# Patient Record
Sex: Male | Born: 1965 | Race: White | Hispanic: No | Smoking: Never smoker
Health system: Southern US, Community
[De-identification: ages and names within clinical notes are randomized; demographics above are authoritative.]

## PROBLEM LIST (undated history)

## (undated) DIAGNOSIS — I5022 Chronic systolic (congestive) heart failure: Secondary | ICD-10-CM

## (undated) DIAGNOSIS — I491 Atrial premature depolarization: Secondary | ICD-10-CM

## (undated) DIAGNOSIS — K5090 Crohn's disease, unspecified, without complications: Secondary | ICD-10-CM

## (undated) DIAGNOSIS — I2510 Atherosclerotic heart disease of native coronary artery without angina pectoris: Secondary | ICD-10-CM

## (undated) DIAGNOSIS — I471 Supraventricular tachycardia, unspecified: Secondary | ICD-10-CM

## (undated) DIAGNOSIS — F329 Major depressive disorder, single episode, unspecified: Secondary | ICD-10-CM

## (undated) DIAGNOSIS — I5020 Unspecified systolic (congestive) heart failure: Secondary | ICD-10-CM

## (undated) DIAGNOSIS — I428 Other cardiomyopathies: Secondary | ICD-10-CM

## (undated) DIAGNOSIS — N200 Calculus of kidney: Secondary | ICD-10-CM

## (undated) DIAGNOSIS — I10 Essential (primary) hypertension: Secondary | ICD-10-CM

## (undated) DIAGNOSIS — R011 Cardiac murmur, unspecified: Secondary | ICD-10-CM

## (undated) DIAGNOSIS — E669 Obesity, unspecified: Secondary | ICD-10-CM

## (undated) DIAGNOSIS — M549 Dorsalgia, unspecified: Secondary | ICD-10-CM

## (undated) DIAGNOSIS — I4891 Unspecified atrial fibrillation: Secondary | ICD-10-CM

## (undated) DIAGNOSIS — G4733 Obstructive sleep apnea (adult) (pediatric): Secondary | ICD-10-CM

## (undated) DIAGNOSIS — F988 Other specified behavioral and emotional disorders with onset usually occurring in childhood and adolescence: Secondary | ICD-10-CM

## (undated) DIAGNOSIS — G8929 Other chronic pain: Secondary | ICD-10-CM

## (undated) HISTORY — DX: Calculus of kidney: N20.0

## (undated) HISTORY — DX: Essential (primary) hypertension: I10

## (undated) HISTORY — DX: Supraventricular tachycardia, unspecified: I47.10

## (undated) HISTORY — DX: Obstructive sleep apnea (adult) (pediatric): G47.33

## (undated) HISTORY — DX: Other cardiomyopathies: I42.8

## (undated) HISTORY — DX: Unspecified systolic (congestive) heart failure: I50.20

## (undated) HISTORY — DX: Other chronic pain: G89.29

## (undated) HISTORY — PX: CARDIAC CATHETERIZATION: SHX172

## (undated) HISTORY — DX: Supraventricular tachycardia: I47.1

## (undated) HISTORY — DX: Major depressive disorder, single episode, unspecified: F32.9

## (undated) HISTORY — DX: Chronic systolic (congestive) heart failure: I50.22

## (undated) HISTORY — DX: Other chronic pain: M54.9

## (undated) HISTORY — PX: VASECTOMY: SHX75

## (undated) HISTORY — DX: Atherosclerotic heart disease of native coronary artery without angina pectoris: I25.10

## (undated) HISTORY — PX: KIDNEY STONE SURGERY: SHX686

## (undated) HISTORY — DX: Atrial premature depolarization: I49.1

---

## 2008-12-24 ENCOUNTER — Emergency Department: Payer: Self-pay | Admitting: Emergency Medicine

## 2011-10-01 ENCOUNTER — Emergency Department: Payer: Self-pay | Admitting: Emergency Medicine

## 2011-10-01 LAB — BASIC METABOLIC PANEL
BUN: 13 mg/dL (ref 7–18)
Calcium, Total: 9.3 mg/dL (ref 8.5–10.1)
Chloride: 102 mmol/L (ref 98–107)
Co2: 29 mmol/L (ref 21–32)
Creatinine: 1.05 mg/dL (ref 0.60–1.30)
EGFR (African American): >60
Glucose: 89 mg/dL (ref 65–99)

## 2011-10-01 LAB — CBC
MCH: 29.1 pg (ref 26.0–34.0)
MCV: 90 fL (ref 80–100)
Platelet: 348 10*3/uL (ref 150–440)

## 2011-10-01 LAB — CK TOTAL AND CKMB (NOT AT ARMC): CK-MB: 2.8 ng/mL (ref 0.5–3.6)

## 2011-10-01 LAB — TROPONIN I
Troponin-I: <0.02 ng/mL
Troponin-I: <0.02 ng/mL

## 2011-10-03 ENCOUNTER — Ambulatory Visit: Payer: Self-pay | Admitting: Family Medicine

## 2011-11-08 ENCOUNTER — Ambulatory Visit: Payer: Self-pay | Admitting: Cardiology

## 2011-11-29 ENCOUNTER — Ambulatory Visit: Payer: Self-pay | Admitting: Cardiology

## 2013-03-03 ENCOUNTER — Emergency Department: Payer: Self-pay | Admitting: Internal Medicine

## 2013-03-03 LAB — COMPREHENSIVE METABOLIC PANEL
Alkaline Phosphatase: 128 U/L (ref 50–136)
BUN: 13 mg/dL (ref 7–18)
Calcium, Total: 9.3 mg/dL (ref 8.5–10.1)
Co2: 28 mmol/L (ref 21–32)
Creatinine: 1.41 mg/dL — ABNORMAL HIGH (ref 0.60–1.30)
EGFR (Non-African Amer.): 59 — ABNORMAL LOW
Osmolality: 275 (ref 275–301)
Potassium: 3.3 mmol/L — ABNORMAL LOW (ref 3.5–5.1)
SGOT(AST): 28 U/L (ref 15–37)
SGPT (ALT): 34 U/L (ref 12–78)
Total Protein: 7.8 g/dL (ref 6.4–8.2)

## 2013-03-03 LAB — URINALYSIS, COMPLETE
Glucose,UR: NEGATIVE mg/dL (ref 0–75)
Ketone: NEGATIVE
Nitrite: NEGATIVE
Ph: 5 (ref 4.5–8.0)
Protein: NEGATIVE
RBC,UR: 6 /HPF (ref 0–5)
Specific Gravity: 1.017 (ref 1.003–1.030)
WBC UR: 1 /HPF (ref 0–5)

## 2013-03-03 LAB — CBC: HGB: 15.8 g/dL (ref 13.0–18.0)

## 2015-09-20 DIAGNOSIS — Z6841 Body Mass Index (BMI) 40.0 and over, adult: Secondary | ICD-10-CM

## 2016-06-17 DIAGNOSIS — I10 Essential (primary) hypertension: Secondary | ICD-10-CM | POA: Diagnosis not present

## 2016-06-17 DIAGNOSIS — J449 Chronic obstructive pulmonary disease, unspecified: Secondary | ICD-10-CM | POA: Diagnosis not present

## 2016-06-17 DIAGNOSIS — D513 Other dietary vitamin B12 deficiency anemia: Secondary | ICD-10-CM | POA: Diagnosis not present

## 2016-06-17 DIAGNOSIS — E109 Type 1 diabetes mellitus without complications: Secondary | ICD-10-CM | POA: Diagnosis not present

## 2016-06-17 DIAGNOSIS — Q6101 Congenital single renal cyst: Secondary | ICD-10-CM | POA: Diagnosis not present

## 2016-06-17 DIAGNOSIS — E559 Vitamin D deficiency, unspecified: Secondary | ICD-10-CM | POA: Diagnosis not present

## 2016-06-17 DIAGNOSIS — R1013 Epigastric pain: Secondary | ICD-10-CM | POA: Diagnosis not present

## 2016-06-25 DIAGNOSIS — E042 Nontoxic multinodular goiter: Secondary | ICD-10-CM | POA: Diagnosis not present

## 2016-06-25 DIAGNOSIS — R05 Cough: Secondary | ICD-10-CM | POA: Diagnosis not present

## 2016-06-25 DIAGNOSIS — J449 Chronic obstructive pulmonary disease, unspecified: Secondary | ICD-10-CM | POA: Diagnosis not present

## 2016-06-25 DIAGNOSIS — J9801 Acute bronchospasm: Secondary | ICD-10-CM | POA: Diagnosis not present

## 2016-06-25 DIAGNOSIS — Z23 Encounter for immunization: Secondary | ICD-10-CM | POA: Diagnosis not present

## 2016-06-25 DIAGNOSIS — I10 Essential (primary) hypertension: Secondary | ICD-10-CM | POA: Diagnosis not present

## 2016-07-02 DIAGNOSIS — R05 Cough: Secondary | ICD-10-CM | POA: Diagnosis not present

## 2016-07-02 DIAGNOSIS — J310 Chronic rhinitis: Secondary | ICD-10-CM | POA: Diagnosis not present

## 2016-07-23 DIAGNOSIS — R05 Cough: Secondary | ICD-10-CM | POA: Diagnosis not present

## 2016-07-23 DIAGNOSIS — G4733 Obstructive sleep apnea (adult) (pediatric): Secondary | ICD-10-CM | POA: Diagnosis not present

## 2016-09-03 DIAGNOSIS — I10 Essential (primary) hypertension: Secondary | ICD-10-CM | POA: Diagnosis not present

## 2016-09-03 DIAGNOSIS — E041 Nontoxic single thyroid nodule: Secondary | ICD-10-CM | POA: Diagnosis not present

## 2016-09-18 DIAGNOSIS — M9905 Segmental and somatic dysfunction of pelvic region: Secondary | ICD-10-CM | POA: Diagnosis not present

## 2016-09-18 DIAGNOSIS — M9903 Segmental and somatic dysfunction of lumbar region: Secondary | ICD-10-CM | POA: Diagnosis not present

## 2016-09-18 DIAGNOSIS — M5442 Lumbago with sciatica, left side: Secondary | ICD-10-CM | POA: Diagnosis not present

## 2016-09-25 DIAGNOSIS — M9903 Segmental and somatic dysfunction of lumbar region: Secondary | ICD-10-CM | POA: Diagnosis not present

## 2016-09-25 DIAGNOSIS — M5442 Lumbago with sciatica, left side: Secondary | ICD-10-CM | POA: Diagnosis not present

## 2016-09-25 DIAGNOSIS — M9905 Segmental and somatic dysfunction of pelvic region: Secondary | ICD-10-CM | POA: Diagnosis not present

## 2016-09-26 DIAGNOSIS — J342 Deviated nasal septum: Secondary | ICD-10-CM | POA: Diagnosis not present

## 2016-09-26 DIAGNOSIS — E041 Nontoxic single thyroid nodule: Secondary | ICD-10-CM | POA: Diagnosis not present

## 2016-09-30 ENCOUNTER — Other Ambulatory Visit: Payer: Self-pay | Admitting: *Deleted

## 2016-09-30 ENCOUNTER — Other Ambulatory Visit: Payer: Self-pay | Admitting: Otolaryngology

## 2016-09-30 DIAGNOSIS — E041 Nontoxic single thyroid nodule: Secondary | ICD-10-CM

## 2016-10-10 ENCOUNTER — Ambulatory Visit
Admission: RE | Admit: 2016-10-10 | Discharge: 2016-10-10 | Disposition: A | Discharge status: Home / Self Care | Payer: 59 | Source: Ambulatory Visit | Attending: Otolaryngology | Admitting: Otolaryngology

## 2016-10-10 DIAGNOSIS — E041 Nontoxic single thyroid nodule: Secondary | ICD-10-CM

## 2016-10-10 DIAGNOSIS — E042 Nontoxic multinodular goiter: Secondary | ICD-10-CM | POA: Diagnosis not present

## 2017-01-27 DIAGNOSIS — G4733 Obstructive sleep apnea (adult) (pediatric): Secondary | ICD-10-CM | POA: Diagnosis not present

## 2017-01-27 DIAGNOSIS — I10 Essential (primary) hypertension: Secondary | ICD-10-CM | POA: Diagnosis not present

## 2017-07-09 DIAGNOSIS — H1033 Unspecified acute conjunctivitis, bilateral: Secondary | ICD-10-CM | POA: Diagnosis not present

## 2017-07-09 DIAGNOSIS — J40 Bronchitis, not specified as acute or chronic: Secondary | ICD-10-CM | POA: Diagnosis not present

## 2017-07-09 DIAGNOSIS — J019 Acute sinusitis, unspecified: Secondary | ICD-10-CM | POA: Diagnosis not present

## 2017-07-09 DIAGNOSIS — R509 Fever, unspecified: Secondary | ICD-10-CM | POA: Diagnosis not present

## 2017-07-09 DIAGNOSIS — R05 Cough: Secondary | ICD-10-CM | POA: Diagnosis not present

## 2017-08-11 DIAGNOSIS — R109 Unspecified abdominal pain: Secondary | ICD-10-CM | POA: Diagnosis not present

## 2017-08-11 DIAGNOSIS — R300 Dysuria: Secondary | ICD-10-CM | POA: Diagnosis not present

## 2017-08-11 DIAGNOSIS — M545 Low back pain: Secondary | ICD-10-CM | POA: Diagnosis not present

## 2017-08-11 DIAGNOSIS — E876 Hypokalemia: Secondary | ICD-10-CM | POA: Diagnosis not present

## 2017-08-11 DIAGNOSIS — J929 Pleural plaque without asbestos: Secondary | ICD-10-CM | POA: Diagnosis not present

## 2017-08-13 DIAGNOSIS — M9905 Segmental and somatic dysfunction of pelvic region: Secondary | ICD-10-CM | POA: Diagnosis not present

## 2017-08-13 DIAGNOSIS — M545 Low back pain: Secondary | ICD-10-CM | POA: Diagnosis not present

## 2017-08-13 DIAGNOSIS — M9903 Segmental and somatic dysfunction of lumbar region: Secondary | ICD-10-CM | POA: Diagnosis not present

## 2017-08-15 DIAGNOSIS — M9905 Segmental and somatic dysfunction of pelvic region: Secondary | ICD-10-CM | POA: Diagnosis not present

## 2017-08-15 DIAGNOSIS — M545 Low back pain: Secondary | ICD-10-CM | POA: Diagnosis not present

## 2017-08-15 DIAGNOSIS — M9903 Segmental and somatic dysfunction of lumbar region: Secondary | ICD-10-CM | POA: Diagnosis not present

## 2017-08-20 DIAGNOSIS — M9905 Segmental and somatic dysfunction of pelvic region: Secondary | ICD-10-CM | POA: Diagnosis not present

## 2017-08-20 DIAGNOSIS — M9903 Segmental and somatic dysfunction of lumbar region: Secondary | ICD-10-CM | POA: Diagnosis not present

## 2017-08-20 DIAGNOSIS — M545 Low back pain: Secondary | ICD-10-CM | POA: Diagnosis not present

## 2017-08-28 DIAGNOSIS — M9903 Segmental and somatic dysfunction of lumbar region: Secondary | ICD-10-CM | POA: Diagnosis not present

## 2017-08-28 DIAGNOSIS — M9905 Segmental and somatic dysfunction of pelvic region: Secondary | ICD-10-CM | POA: Diagnosis not present

## 2017-08-28 DIAGNOSIS — M545 Low back pain: Secondary | ICD-10-CM | POA: Diagnosis not present

## 2017-08-29 DIAGNOSIS — M545 Low back pain: Secondary | ICD-10-CM | POA: Diagnosis not present

## 2017-08-29 DIAGNOSIS — R202 Paresthesia of skin: Secondary | ICD-10-CM | POA: Diagnosis not present

## 2017-08-29 DIAGNOSIS — M5416 Radiculopathy, lumbar region: Secondary | ICD-10-CM | POA: Diagnosis not present

## 2017-08-29 DIAGNOSIS — R1031 Right lower quadrant pain: Secondary | ICD-10-CM | POA: Diagnosis not present

## 2017-08-29 DIAGNOSIS — R350 Frequency of micturition: Secondary | ICD-10-CM | POA: Diagnosis not present

## 2017-08-29 DIAGNOSIS — R1011 Right upper quadrant pain: Secondary | ICD-10-CM | POA: Diagnosis not present

## 2017-09-02 ENCOUNTER — Other Ambulatory Visit: Payer: Self-pay | Admitting: Family Medicine

## 2017-09-02 DIAGNOSIS — M4726 Other spondylosis with radiculopathy, lumbar region: Secondary | ICD-10-CM | POA: Diagnosis not present

## 2017-09-02 DIAGNOSIS — M545 Low back pain: Secondary | ICD-10-CM | POA: Diagnosis not present

## 2017-09-02 DIAGNOSIS — M5416 Radiculopathy, lumbar region: Secondary | ICD-10-CM | POA: Diagnosis not present

## 2017-09-02 DIAGNOSIS — I10 Essential (primary) hypertension: Secondary | ICD-10-CM | POA: Diagnosis not present

## 2017-09-02 DIAGNOSIS — E876 Hypokalemia: Secondary | ICD-10-CM | POA: Diagnosis not present

## 2017-09-03 ENCOUNTER — Other Ambulatory Visit: Payer: Self-pay | Admitting: Family Medicine

## 2017-09-03 DIAGNOSIS — M545 Low back pain, unspecified: Secondary | ICD-10-CM

## 2017-09-04 ENCOUNTER — Ambulatory Visit
Admission: RE | Admit: 2017-09-04 | Discharge: 2017-09-04 | Disposition: A | Discharge status: Home / Self Care | Payer: 59 | Source: Ambulatory Visit | Attending: Family Medicine | Admitting: Family Medicine

## 2017-09-04 DIAGNOSIS — R93421 Abnormal radiologic findings on diagnostic imaging of right kidney: Secondary | ICD-10-CM | POA: Diagnosis not present

## 2017-09-04 DIAGNOSIS — N281 Cyst of kidney, acquired: Secondary | ICD-10-CM | POA: Insufficient documentation

## 2017-09-04 DIAGNOSIS — M545 Low back pain, unspecified: Secondary | ICD-10-CM

## 2017-09-09 ENCOUNTER — Other Ambulatory Visit: Payer: Self-pay | Admitting: Family Medicine

## 2017-09-09 DIAGNOSIS — N281 Cyst of kidney, acquired: Secondary | ICD-10-CM

## 2017-09-11 ENCOUNTER — Other Ambulatory Visit: Payer: Self-pay | Admitting: Family Medicine

## 2017-09-11 ENCOUNTER — Ambulatory Visit
Admission: RE | Admit: 2017-09-11 | Discharge: 2017-09-11 | Disposition: A | Discharge status: Home / Self Care | Payer: 59 | Source: Ambulatory Visit | Attending: Family Medicine | Admitting: Family Medicine

## 2017-09-11 DIAGNOSIS — N281 Cyst of kidney, acquired: Secondary | ICD-10-CM

## 2017-09-11 DIAGNOSIS — M5126 Other intervertebral disc displacement, lumbar region: Secondary | ICD-10-CM | POA: Diagnosis not present

## 2017-09-11 DIAGNOSIS — M4807 Spinal stenosis, lumbosacral region: Secondary | ICD-10-CM | POA: Insufficient documentation

## 2017-09-11 DIAGNOSIS — M5116 Intervertebral disc disorders with radiculopathy, lumbar region: Secondary | ICD-10-CM | POA: Insufficient documentation

## 2017-09-11 DIAGNOSIS — Z0000 Encounter for general adult medical examination without abnormal findings: Secondary | ICD-10-CM | POA: Diagnosis not present

## 2017-09-11 DIAGNOSIS — E876 Hypokalemia: Secondary | ICD-10-CM | POA: Diagnosis not present

## 2017-09-11 DIAGNOSIS — M5416 Radiculopathy, lumbar region: Secondary | ICD-10-CM | POA: Diagnosis present

## 2017-09-11 DIAGNOSIS — I10 Essential (primary) hypertension: Secondary | ICD-10-CM | POA: Diagnosis not present

## 2017-09-11 DIAGNOSIS — M5127 Other intervertebral disc displacement, lumbosacral region: Secondary | ICD-10-CM | POA: Insufficient documentation

## 2017-09-11 DIAGNOSIS — M48061 Spinal stenosis, lumbar region without neurogenic claudication: Secondary | ICD-10-CM | POA: Diagnosis not present

## 2017-09-11 DIAGNOSIS — R739 Hyperglycemia, unspecified: Secondary | ICD-10-CM | POA: Diagnosis not present

## 2017-09-24 ENCOUNTER — Ambulatory Visit
Admission: RE | Admit: 2017-09-24 | Discharge: 2017-09-24 | Disposition: A | Discharge status: Home / Self Care | Payer: 59 | Source: Ambulatory Visit | Attending: Family Medicine | Admitting: Family Medicine

## 2017-09-24 DIAGNOSIS — N281 Cyst of kidney, acquired: Secondary | ICD-10-CM

## 2017-09-24 DIAGNOSIS — K760 Fatty (change of) liver, not elsewhere classified: Secondary | ICD-10-CM | POA: Diagnosis not present

## 2017-10-01 DIAGNOSIS — M62830 Muscle spasm of back: Secondary | ICD-10-CM | POA: Diagnosis not present

## 2017-10-01 DIAGNOSIS — M5416 Radiculopathy, lumbar region: Secondary | ICD-10-CM | POA: Diagnosis not present

## 2017-10-01 DIAGNOSIS — M4726 Other spondylosis with radiculopathy, lumbar region: Secondary | ICD-10-CM | POA: Diagnosis not present

## 2017-10-10 ENCOUNTER — Ambulatory Visit: Payer: Self-pay

## 2017-10-10 DIAGNOSIS — M5136 Other intervertebral disc degeneration, lumbar region: Secondary | ICD-10-CM | POA: Diagnosis not present

## 2017-10-10 DIAGNOSIS — M5416 Radiculopathy, lumbar region: Secondary | ICD-10-CM | POA: Diagnosis not present

## 2017-10-20 DIAGNOSIS — M5136 Other intervertebral disc degeneration, lumbar region: Secondary | ICD-10-CM | POA: Diagnosis not present

## 2017-10-20 DIAGNOSIS — M5416 Radiculopathy, lumbar region: Secondary | ICD-10-CM | POA: Diagnosis not present

## 2017-10-21 DIAGNOSIS — M545 Low back pain: Secondary | ICD-10-CM | POA: Diagnosis not present

## 2017-10-21 DIAGNOSIS — M9903 Segmental and somatic dysfunction of lumbar region: Secondary | ICD-10-CM | POA: Diagnosis not present

## 2017-10-21 DIAGNOSIS — M9905 Segmental and somatic dysfunction of pelvic region: Secondary | ICD-10-CM | POA: Diagnosis not present

## 2017-10-22 ENCOUNTER — Encounter: Payer: Self-pay | Admitting: Physician Assistant

## 2017-10-22 ENCOUNTER — Ambulatory Visit: Payer: 59 | Admitting: Physician Assistant

## 2017-10-22 VITALS — BP 114/72 | HR 76 | Temp 98.5°F | Resp 16 | Ht 69.0 in | Wt 300.0 lb

## 2017-10-22 DIAGNOSIS — R5383 Other fatigue: Secondary | ICD-10-CM | POA: Diagnosis not present

## 2017-10-22 DIAGNOSIS — G4730 Sleep apnea, unspecified: Secondary | ICD-10-CM

## 2017-10-22 DIAGNOSIS — R7303 Prediabetes: Secondary | ICD-10-CM | POA: Diagnosis not present

## 2017-10-22 DIAGNOSIS — M549 Dorsalgia, unspecified: Secondary | ICD-10-CM

## 2017-10-22 DIAGNOSIS — G8929 Other chronic pain: Secondary | ICD-10-CM | POA: Diagnosis not present

## 2017-10-22 MED ORDER — MELOXICAM 15 MG PO TABS: 15.0000 mg | ORAL_TABLET | Freq: Every day | ORAL | 0 refills | Status: DC

## 2017-10-22 NOTE — Patient Instructions (Signed)
Prediabetes Eating Plan Prediabetes--also called impaired glucose tolerance or impaired fasting glucose--is a condition that causes blood sugar (blood glucose) levels to be higher than normal. Following a healthy diet can help to keep prediabetes under control. It can also help to lower the risk of type 2 diabetes and heart disease, which are increased in people who have prediabetes. Along with regular exercise, a healthy diet:  Promotes weight loss.  Helps to control blood sugar levels.  Helps to improve the way that the body uses insulin.  What do I need to know about this eating plan?  Use the glycemic index (GI) to plan your meals. The index tells you how quickly a food will raise your blood sugar. Choose low-GI foods. These foods take a longer time to raise blood sugar.  Pay close attention to the amount of carbohydrates in the food that you eat. Carbohydrates increase blood sugar levels.  Keep track of how many calories you take in. Eating the right amount of calories will help you to achieve a healthy weight. Losing about 7 percent of your starting weight can help to prevent type 2 diabetes.  You may want to follow a Mediterranean diet. This diet includes a lot of vegetables, lean meats or fish, whole grains, fruits, and healthy oils and fats. What foods can I eat? Grains Whole grains, such as whole-wheat or whole-grain breads, crackers, cereals, and pasta. Unsweetened oatmeal. Bulgur. Barley. Quinoa. Brown rice. Corn or whole-wheat flour tortillas or taco shells. Vegetables Lettuce. Spinach. Peas. Beets. Cauliflower. Cabbage. Broccoli. Carrots. Tomatoes. Squash. Eggplant. Herbs. Peppers. Onions. Cucumbers. Brussels sprouts. Fruits Berries. Bananas. Apples. Oranges. Grapes. Papaya. Mango. Pomegranate. Kiwi. Grapefruit. Cherries. Meats and Other Protein Sources Seafood. Lean meats, such as chicken and [place] or lean cuts of pork and beef. Tofu. Eggs. Nuts. Beans. Dairy Low-fat or  fat-free dairy products, such as yogurt, cottage cheese, and cheese. Beverages Water. Tea. Coffee. Sugar-free or diet soda. Seltzer water. Milk. Milk alternatives, such as soy or almond milk. Condiments Mustard. Relish. Low-fat, low-sugar ketchup. Low-fat, low-sugar barbecue sauce. Low-fat or fat-free mayonnaise. Sweets and Desserts Sugar-free or low-fat pudding. Sugar-free or low-fat ice cream and other frozen treats. Fats and Oils Avocado. Walnuts. Olive oil. The items listed above may not be a complete list of recommended foods or beverages. Contact your dietitian for more options. What foods are not recommended? Grains Refined white flour and flour products, such as bread, pasta, snack foods, and cereals. Beverages Sweetened drinks, such as sweet iced tea and soda. Sweets and Desserts Baked goods, such as cake, cupcakes, pastries, cookies, and cheesecake. The items listed above may not be a complete list of foods and beverages to avoid. Contact your dietitian for more information. This information is not intended to replace advice given to you by your health care provider. Make sure you discuss any questions you have with your health care provider. Document Released: 10/04/2014 Document Revised: 10/26/2015 Document Reviewed: 06/15/2014 Elsevier Interactive Patient Education  2017 [employer].

## 2017-10-22 NOTE — Progress Notes (Signed)
Patient: Aaron [name] Male    DOB: 01-12-1966   52 y.o.   MRN: [id number] Visit Date: 10/22/2017  Today's Provider: Trinna Post, PA-C   Chief Complaint  Patient presents with  . Establish Care   Subjective:    Aaron [name] is a 52 y/o man with history of prediabetes, sleep apnea, and HTN presenting today to establish care. He is living in [place] with his wife of 27 years and two children ages 64 and 39. 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    Back Pain  He presents today with concern of back pain. This has been going on for two months now. He reports it is in his right side and increases with bending and twisting. Denies injuries, falls. Denies numbness or weakness. Denies difficulty urinating. He has been seen by multiple providers for this issue including urgent care at [facility], Dr. Kary Kos, and Dr. Sharlet Salina.  He saw Dr. Kary Kos about this on 4.2.2019 and an ultrasound was performed which revealed a renal cyst. This cyst was present on a 10.1.2014 ultrasound. The patient relates his pain greatly to this and says he didn't start having back pain until they discovered his cysts, however cysts were present since 2014. His PCP provided a work note for this. This work note was extended to 09/25/2017.  Then, Patient was seen by Allene Dillon, NP at physiatry office and was diagnosed with lumbar radiculopathy and an MRI lumbar spine was ordered with results in the Image tab. Patient again was written out of work, provided hydrocodone. Patient underwent at least two injections with only mild improvement in pain. Patient's work excuse was extended even further to at least 10/20/2017 by physiatry. Patient called back to physiatry on 10/15/2017 asking to extend work note. Clinic declined and recommended follow up with pain management. Patient said he will seek care elsewhere.   Patient presented on 10/20/2017 to [facility]. He again  presented with back pain. Clnic recommended muscle relaxers, patient declined, as well as lidocaine patches. Also recommended PT referral. Patient requested a work note and clinic declined. Provider at that clinic even consulted with PCP Dr. Kary Kos.   Currently, patient has an appointment scheduled with urology on 11/14/2017.  HTN  Controlled on Edarby Clor.   ADD(?)/Fatigue  Patient says he is prescribed Adderall for fatigue due to 12 hour shifts. Says he does not have ADD despite this being listed in his Problem List.   Sleep Apnea  Reports compliance with auto-titrating CPAP.  Living with wife - married 27 years Children: two children 33 and 76 Work: Production designer, theatre/television/film, Merchant navy officer run a Designer, television/film set Cancer Screening  Due. Says he will "get it in the next year."  Back Pain  This is a chronic problem. The current episode started more than 1 month ago (Started about two months ago.). The problem is unchanged. The pain is present in the lumbar spine (Left side). The quality of the pain is described as aching. The pain does not radiate. The pain is at a severity of 6/10. The symptoms are aggravated by bending. Pertinent negatives include no abdominal pain, bladder incontinence, bowel incontinence, chest pain, dysuria, fever, headaches, leg pain, numbness, paresis, paresthesias, pelvic pain, perianal numbness, tingling, weakness or weight loss. He has tried chiropractic manipulation, analgesics and home exercises for the symptoms.       No Known Allergies   Current Outpatient  Medications:  .  amphetamine-dextroamphetamine (ADDERALL) 30 MG tablet, Take by mouth., Disp: , Rfl:  .  EDARBYCLOR 40-25 MG TABS, , Disp: , Rfl:  .  HYDROcodone-acetaminophen (NORCO/VICODIN) 5-325 MG tablet, Take by mouth., Disp: , Rfl:  .  traZODone (DESYREL) 50 MG tablet, TAKE 1 TABLET NIGHTLY AS   NEEDED FOR SLEEP, Disp: , Rfl:  .  metaxalone (SKELAXIN) 800 MG tablet, TAKE 1 TABLET (800 MG TOTAL)  BY MOUTH 3 (THREE) TIMES DAILY AS NEEDED FOR PAIN, Disp: , Rfl: 0  Review of Systems  Constitutional: Negative.  Negative for fever and weight loss.  HENT: Positive for tinnitus. Negative for congestion, dental problem, drooling, ear discharge, ear pain, facial swelling, hearing loss, mouth sores, nosebleeds, postnasal drip, rhinorrhea, sinus pressure, sinus pain, sneezing, sore throat, trouble swallowing and voice change.   Eyes: Negative.   Respiratory: Negative.   Cardiovascular: Negative.  Negative for chest pain.  Gastrointestinal: Negative.  Negative for abdominal pain and bowel incontinence.  Endocrine: Negative.   Genitourinary: Positive for frequency. Negative for bladder incontinence, decreased urine volume, difficulty urinating, discharge, dysuria, enuresis, flank pain, genital sores, hematuria, pelvic pain, penile pain, penile swelling, scrotal swelling, testicular pain and urgency.  Musculoskeletal: Positive for back pain.  Skin: Negative.   Allergic/Immunologic: Negative.   Neurological: Negative.  Negative for tingling, weakness, numbness, headaches and paresthesias.  Hematological: Negative.   Psychiatric/Behavioral: Negative.     Social History   Tobacco Use  . Smoking status: Never Smoker  . Smokeless tobacco: Never Used  Substance Use Topics  . Alcohol use: Never    Frequency: Never   Objective:   BP 114/72 (BP Location: Left Arm, Patient Position: Sitting, Cuff Size: Large)   Pulse 76   Temp 98.5 F (36.9 C) (Oral)   Resp 16   Ht 5\' 9"  (1.753 m)   Wt 300 lb (136.1 kg)   BMI 44.30 kg/m  Vitals:   10/22/17 1414  BP: 114/72  Pulse: 76  Resp: 16  Temp: 98.5 F (36.9 C)  TempSrc: Oral  Weight: 300 lb (136.1 kg)  Height: 5\' 9"  (1.753 m)     Physical Exam  Constitutional: He is oriented to person, place, and time. He appears well-developed and well-nourished.  Cardiovascular: Normal rate and regular rhythm.  Pulmonary/Chest: Effort normal and breath  sounds normal.  Abdominal: Soft. Bowel sounds are normal.  Musculoskeletal: He exhibits tenderness.       Thoracic back: He exhibits decreased range of motion, tenderness and pain. He exhibits no swelling, no edema, no deformity and no laceration.       Back:  Neurological: He is alert and oriented to person, place, and time.  Skin: Skin is warm and dry.  Psychiatric: He has a normal mood and affect. His behavior is normal.        Assessment & Plan:     1. Chronic right-sided back pain, unspecified back location  Patient is fixated on pain today. He has seen multiple providers for this including his former PCP, physiatry, and Urgent care. His labwork available in Care Everywhere shows normal kidney function, normal urinalysis, normal PSA. CBC with slight white count and left shift. He denies urinary symptoms today.   He reports that he can feel "knots from his kidneys" and is very worried that the cysts on his kidneys are causing him pain. These cysts have been present documented on ultrasound since 2014, so I have low suspicion for them to be cause of pain, though  I do think he should see urology.   He has a well documented history in his chart of asking providers for work notes and seeking out another provider when work note was not provided. He received initial work note from PCP and then from physiatry. He has been out of work for nearly 2 months with no discernable cause for his pain. He saw [facility] care on 10/20/2017 at 9:37 AM. When they declined to write work note, he said he would seek care elsewhere and appointment at this clinic was made on 10/20/2017 at 10:55 AM according to the EMR. I have reviewed all of his imaging and his exam is consistent with MSK cause of pain, which may be helped by physical therapy, weight loss, and muscle relaxers. He asks about a work note today and I see no reason to write one. He should follow up with appropriate specialists.  - meloxicam  (MOBIC) 15 MG tablet; Take 1 tablet (15 mg total) by mouth daily.  Dispense: 30 tablet; Refill: 0  2. Prediabetes  Last A1C 6.2. Counseled on weight loss and dietary plan.   3. Sleep apnea, unspecified type  CPAP compliant. Likely related to obesity. Should continue efforts to lose weight.  4. Fatigue, unspecified type  Says he does not have ADD and that he gets Adderall fatigue. This is contrary to what is documented in the chart. Nevertheless, I will not fill this script for that indication. Should he desire medication for ADD, he will have to produce records of evaluation or be willing to be evaluated.   Return in about 6 months (around 04/24/2018) for colonoscopy, prediabetes .  The entirety of the information documented in the History of Present Illness, Review of Systems and Physical Exam were personally obtained by me. Portions of this information were initially documented by Ashley Royalty, CMA and reviewed by me for thoroughness and accuracy.         Trinna Post, PA-C  [facility]

## 2017-10-24 ENCOUNTER — Ambulatory Visit: Payer: 59 | Admitting: Physical Therapy

## 2017-10-30 DIAGNOSIS — M5489 Other dorsalgia: Secondary | ICD-10-CM | POA: Diagnosis not present

## 2017-10-30 DIAGNOSIS — R300 Dysuria: Secondary | ICD-10-CM | POA: Diagnosis not present

## 2017-11-04 ENCOUNTER — Encounter: Payer: 59 | Admitting: Physical Therapy

## 2017-11-07 ENCOUNTER — Encounter: Payer: 59 | Admitting: Physical Therapy

## 2017-11-11 DIAGNOSIS — M9903 Segmental and somatic dysfunction of lumbar region: Secondary | ICD-10-CM | POA: Diagnosis not present

## 2017-11-11 DIAGNOSIS — M545 Low back pain: Secondary | ICD-10-CM | POA: Diagnosis not present

## 2017-11-11 DIAGNOSIS — M9905 Segmental and somatic dysfunction of pelvic region: Secondary | ICD-10-CM | POA: Diagnosis not present

## 2017-11-14 ENCOUNTER — Ambulatory Visit: Payer: Self-pay

## 2017-11-14 DIAGNOSIS — Z1389 Encounter for screening for other disorder: Secondary | ICD-10-CM | POA: Diagnosis not present

## 2017-11-14 DIAGNOSIS — R5383 Other fatigue: Secondary | ICD-10-CM | POA: Diagnosis not present

## 2017-11-14 DIAGNOSIS — I10 Essential (primary) hypertension: Secondary | ICD-10-CM | POA: Diagnosis not present

## 2017-11-21 DIAGNOSIS — M9905 Segmental and somatic dysfunction of pelvic region: Secondary | ICD-10-CM | POA: Diagnosis not present

## 2017-11-21 DIAGNOSIS — M545 Low back pain: Secondary | ICD-10-CM | POA: Diagnosis not present

## 2017-11-21 DIAGNOSIS — M9903 Segmental and somatic dysfunction of lumbar region: Secondary | ICD-10-CM | POA: Diagnosis not present

## 2017-12-08 DIAGNOSIS — M5440 Lumbago with sciatica, unspecified side: Secondary | ICD-10-CM | POA: Diagnosis not present

## 2017-12-08 DIAGNOSIS — G894 Chronic pain syndrome: Secondary | ICD-10-CM | POA: Diagnosis not present

## 2017-12-08 DIAGNOSIS — M545 Low back pain: Secondary | ICD-10-CM | POA: Diagnosis not present

## 2017-12-08 DIAGNOSIS — Z79899 Other long term (current) drug therapy: Secondary | ICD-10-CM | POA: Diagnosis not present

## 2017-12-10 DIAGNOSIS — M9902 Segmental and somatic dysfunction of thoracic region: Secondary | ICD-10-CM | POA: Diagnosis not present

## 2017-12-10 DIAGNOSIS — M545 Low back pain: Secondary | ICD-10-CM | POA: Diagnosis not present

## 2017-12-10 DIAGNOSIS — M9903 Segmental and somatic dysfunction of lumbar region: Secondary | ICD-10-CM | POA: Diagnosis not present

## 2018-01-05 DIAGNOSIS — Z79899 Other long term (current) drug therapy: Secondary | ICD-10-CM | POA: Diagnosis not present

## 2018-01-05 DIAGNOSIS — G894 Chronic pain syndrome: Secondary | ICD-10-CM | POA: Diagnosis not present

## 2018-01-05 DIAGNOSIS — M545 Low back pain: Secondary | ICD-10-CM | POA: Diagnosis not present

## 2018-01-05 DIAGNOSIS — M5440 Lumbago with sciatica, unspecified side: Secondary | ICD-10-CM | POA: Diagnosis not present

## 2018-02-16 DIAGNOSIS — G894 Chronic pain syndrome: Secondary | ICD-10-CM | POA: Diagnosis not present

## 2018-02-16 DIAGNOSIS — M545 Low back pain: Secondary | ICD-10-CM | POA: Diagnosis not present

## 2018-02-16 DIAGNOSIS — Z79899 Other long term (current) drug therapy: Secondary | ICD-10-CM | POA: Diagnosis not present

## 2018-03-30 DIAGNOSIS — Z79899 Other long term (current) drug therapy: Secondary | ICD-10-CM | POA: Diagnosis not present

## 2018-03-30 DIAGNOSIS — M545 Low back pain: Secondary | ICD-10-CM | POA: Diagnosis not present

## 2018-03-30 DIAGNOSIS — G894 Chronic pain syndrome: Secondary | ICD-10-CM | POA: Diagnosis not present

## 2018-05-11 DIAGNOSIS — G894 Chronic pain syndrome: Secondary | ICD-10-CM | POA: Diagnosis not present

## 2018-05-11 DIAGNOSIS — M545 Low back pain: Secondary | ICD-10-CM | POA: Diagnosis not present

## 2018-05-11 DIAGNOSIS — Z79899 Other long term (current) drug therapy: Secondary | ICD-10-CM | POA: Diagnosis not present

## 2018-05-20 ENCOUNTER — Encounter: Payer: Self-pay | Admitting: Physician Assistant

## 2018-05-20 ENCOUNTER — Ambulatory Visit: Payer: 59 | Admitting: Physician Assistant

## 2018-05-20 ENCOUNTER — Other Ambulatory Visit: Payer: Self-pay

## 2018-05-20 VITALS — BP 110/84 | HR 74 | Temp 97.7°F | Ht 68.0 in | Wt 303.4 lb

## 2018-05-20 DIAGNOSIS — I10 Essential (primary) hypertension: Secondary | ICD-10-CM

## 2018-05-20 DIAGNOSIS — R7303 Prediabetes: Secondary | ICD-10-CM

## 2018-05-20 DIAGNOSIS — Z23 Encounter for immunization: Secondary | ICD-10-CM | POA: Diagnosis not present

## 2018-05-20 DIAGNOSIS — M549 Dorsalgia, unspecified: Secondary | ICD-10-CM

## 2018-05-20 DIAGNOSIS — Z1211 Encounter for screening for malignant neoplasm of colon: Secondary | ICD-10-CM

## 2018-05-20 DIAGNOSIS — D72829 Elevated white blood cell count, unspecified: Secondary | ICD-10-CM

## 2018-05-20 DIAGNOSIS — G8929 Other chronic pain: Secondary | ICD-10-CM

## 2018-05-20 MED ORDER — MELOXICAM 15 MG PO TABS: 15.0000 mg | ORAL_TABLET | Freq: Every day | ORAL | 0 refills | Status: DC | PRN

## 2018-05-20 NOTE — Progress Notes (Signed)
Patient: Aaron [name] Male    DOB: November 23, 1965   52 y.o.   MRN: [id number] Visit Date: 05/20/2018  Today's Provider: Trinna Post, PA-C   Chief Complaint  Patient presents with  . Medication Refill    bp meds  . Back Pain    request a referral    Subjective:     HPI   HTN: He is currently taking edarby-clor 40-25 mg. He denies chest pain or SOB. He does not have issues taking his medication.   BP Readings from Last 3 Encounters:  05/20/18 110/84  10/22/17 114/72     Back Pain: Since last visit, he has established with [facility] and sees Dr. Nathanial Rancher in [place]. He is receiving vicodin from this provider. He reports that this provider told him that though his imaging does not show defiitive findings, his back muscles are likely wrapped to tightly around his spine. He reports he has been advised that this can potentially be fixed by inserting steel rods into his spine to fix the curvature. Reports this provider will not write him out of work because he was told he could work while taking the medications he is currently on, but his work says otherwise.   He reports his wife is at [facility] with AML.  Prediabetes: Last a1c in care everywhere was 6.0%. He is morbidly obese. Eats poorly and is mostly sedentary.  Wt Readings from Last 3 Encounters:  05/20/18 (!) 303 lb 6.4 oz (137.6 kg)  10/22/17 300 lb (136.1 kg)   Colon Cancer Screening: He has never been screened for colon cancer. Denies personal or family history of colon cancer.   Lab Results  Component Value Date   HGBA1C 6.1 (H) 05/20/2018     No Known Allergies   Current Outpatient Medications:  .  amphetamine-dextroamphetamine (ADDERALL) 30 MG tablet, Take by mouth., Disp: , Rfl:  .  cyclobenzaprine (FLEXERIL) 10 MG tablet, Take 10 mg by mouth at bedtime., Disp: , Rfl:  .  EDARBYCLOR 40-25 MG TABS, , Disp: , Rfl:  .  gabapentin (NEURONTIN) 300 MG capsule, Take  300 mg by mouth 3 (three) times daily. 1 capsule in the morning and 2 capsules at night, Disp: , Rfl:  .  HYDROcodone-acetaminophen (NORCO/VICODIN) 5-325 MG tablet, Take by mouth., Disp: , Rfl:  .  meloxicam (MOBIC) 15 MG tablet, Take 1 tablet (15 mg total) by mouth daily., Disp: 30 tablet, Rfl: 0 .  traZODone (DESYREL) 50 MG tablet, TAKE 1 TABLET NIGHTLY AS   NEEDED FOR SLEEP, Disp: , Rfl:   Review of Systems  Social History   Tobacco Use  . Smoking status: Never Smoker  . Smokeless tobacco: Never Used  Substance Use Topics  . Alcohol use: Never    Frequency: Never      Objective:   BP 110/84 (BP Location: Left Arm, Patient Position: Sitting, Cuff Size: Large)   Pulse 74   Temp 97.7 F (36.5 C) (Oral)   Ht 5' 8"  (1.727 m)   Wt (!) 303 lb 6.4 oz (137.6 kg)   SpO2 94%   BMI 46.13 kg/m  Vitals:   05/20/18 0901  BP: 110/84  Pulse: 74  Temp: 97.7 F (36.5 C)  TempSrc: Oral  SpO2: 94%  Weight: (!) 303 lb 6.4 oz (137.6 kg)  Height: 5' 8"  (1.727 m)     Physical Exam Constitutional:      Appearance: Normal appearance.  He is obese.  Cardiovascular:     Rate and Rhythm: Normal rate and regular rhythm.     Heart sounds: Normal heart sounds.  Pulmonary:     Effort: Pulmonary effort is normal.     Breath sounds: Normal breath sounds.  Musculoskeletal:        General: No swelling.     Right lower leg: No edema.     Left lower leg: No edema.  Neurological:     Mental Status: He is oriented to person, place, and time. Mental status is at baseline.  Psychiatric:        Mood and Affect: Mood normal.        Behavior: Behavior normal.         Assessment & Plan    1. Essential hypertension  - Comprehensive Metabolic Panel (CMET)  2. Prediabetes  - HgB A1c  3. Colon cancer screening  - Cologuard  4. Leukocytosis, unspecified type  - CBC with Differential  5. Chronic right-sided back pain, unspecified back location  - meloxicam (MOBIC) 15 MG tablet; Take 1  tablet (15 mg total) by mouth daily as needed for pain.  Dispense: 30 tablet; Refill: 0  6. Need for influenza vaccination  - Flu Vaccine QUAD 6+ mos PF IM (Fluarix Quad PF)  7. Morbid obesity ([place])  Have counseled on lifestyle modifications. Will consider adding metformin at next visit for prediabetes and weight loss.   Return in about 6 months (around 11/19/2018) for HTN .  The entirety of the information documented in the History of Present Illness, Review of Systems and Physical Exam were personally obtained by me. Portions of this information were initially documented by Hurman Horn, CMA and reviewed by me for thoroughness and accuracy.         Trinna Post, PA-C  [facility]

## 2018-05-21 ENCOUNTER — Other Ambulatory Visit: Payer: Self-pay

## 2018-05-21 ENCOUNTER — Telehealth: Payer: Self-pay

## 2018-05-21 DIAGNOSIS — E876 Hypokalemia: Secondary | ICD-10-CM

## 2018-05-21 LAB — HEMOGLOBIN A1C
Est. average glucose Bld gHb Est-mCnc: 128 mg/dL
Hgb A1c MFr Bld: 6.1 % — ABNORMAL HIGH (ref 4.8–5.6)

## 2018-05-21 LAB — COMPREHENSIVE METABOLIC PANEL
ALT: 26 IU/L (ref 0–44)
AST: 23 IU/L (ref 0–40)
Albumin/Globulin Ratio: 1.3 (ref 1.2–2.2)
Albumin: 3.8 g/dL (ref 3.5–5.5)
Alkaline Phosphatase: 80 IU/L (ref 39–117)
BUN/Creatinine Ratio: 12 (ref 9–20)
BUN: 13 mg/dL (ref 6–24)
Bilirubin Total: 0.3 mg/dL (ref 0.0–1.2)
CO2: 26 mmol/L (ref 20–29)
Calcium: 9.4 mg/dL (ref 8.7–10.2)
Chloride: 100 mmol/L (ref 96–106)
Creatinine, Ser: 1.09 mg/dL (ref 0.76–1.27)
GFR calc Af Amer: 90 mL/min/{1.73_m2} (ref >59)
GFR calc non Af Amer: 78 mL/min/{1.73_m2} (ref >59)
Globulin, Total: 3 g/dL (ref 1.5–4.5)
Glucose: 110 mg/dL — ABNORMAL HIGH (ref 65–99)
Potassium: 3.2 mmol/L — ABNORMAL LOW (ref 3.5–5.2)
Sodium: 141 mmol/L (ref 134–144)
Total Protein: 6.8 g/dL (ref 6.0–8.5)

## 2018-05-21 LAB — CBC WITH DIFFERENTIAL/PLATELET
Basophils Absolute: 0.1 10*3/uL (ref 0.0–0.2)
Basos: 1 %
EOS (ABSOLUTE): 0.3 10*3/uL (ref 0.0–0.4)
Eos: 3 %
Hematocrit: 44.2 % (ref 37.5–51.0)
Hemoglobin: 14.9 g/dL (ref 13.0–17.7)
Immature Grans (Abs): 0 10*3/uL (ref 0.0–0.1)
Immature Granulocytes: 1 %
Lymphocytes Absolute: 2.9 10*3/uL (ref 0.7–3.1)
Lymphs: 36 %
MCH: 30 pg (ref 26.6–33.0)
MCHC: 33.7 g/dL (ref 31.5–35.7)
MCV: 89 fL (ref 79–97)
Monocytes Absolute: 0.5 10*3/uL (ref 0.1–0.9)
Monocytes: 7 %
Neutrophils Absolute: 4.1 10*3/uL (ref 1.4–7.0)
Neutrophils: 52 %
Platelets: 380 10*3/uL (ref 150–450)
RBC: 4.96 x10E6/uL (ref 4.14–5.80)
RDW: 12.4 % (ref 12.3–15.4)
WBC: 7.8 10*3/uL (ref 3.4–10.8)

## 2018-05-21 MED ORDER — POTASSIUM CHLORIDE ER 10 MEQ PO TBCR: 10.0000 meq | EXTENDED_RELEASE_TABLET | Freq: Two times a day (BID) | ORAL | 0 refills | Status: DC

## 2018-05-21 MED ORDER — EDARBYCLOR 40-25 MG PO TABS: ORAL_TABLET | ORAL | 0 refills | Status: DC

## 2018-05-21 NOTE — Telephone Encounter (Signed)
Patient advised.

## 2018-05-21 NOTE — Telephone Encounter (Signed)
Patient is requesting refills on the following medication: EDARBYCLOR 40-25 MG TABS

## 2018-05-21 NOTE — Telephone Encounter (Signed)
Potassium chloride 10 mEq twice daily. Order placed for CMET to check potassium in two weeks, he can walk in for this during lab hours.

## 2018-05-21 NOTE — Telephone Encounter (Signed)
Patient advised as directed below. Per patient he prefers the potassium supplement.

## 2018-05-21 NOTE — Telephone Encounter (Signed)
-----   Message from Trinna Post, [place] sent at 05/21/2018  9:33 AM EST ----- His sugar shows prediabetes - 6.1%. We diagnose diabetes at 6.5%. I do think he should continue to work on dietary changes. Next visit, we can discuss starting metformin. Additionally, his potassium is low. This is likely due to his blood pressure medication. He is stable on the medication and if he does not wish to switch this I can send in potassium supplements and he can return for Lab only visit to check electrolytes in 2 weeks.

## 2018-06-11 ENCOUNTER — Telehealth: Payer: Self-pay | Admitting: Physician Assistant

## 2018-06-11 DIAGNOSIS — I10 Essential (primary) hypertension: Secondary | ICD-10-CM

## 2018-06-11 NOTE — Telephone Encounter (Signed)
Patient states that the pharmacy never received the prescription for potassium chloride (K-DUR) 10 MEQ tablet.  He also states that the nurse said something about changing his blood pressure medication because it might be the cause of his potassium being low.  He states that he is willing to change to something cheaper if that is available.  He is paying $60 a month with his insurance currently.  [facility] uses [facility] in [place].

## 2018-06-12 MED ORDER — LOSARTAN POTASSIUM 50 MG PO TABS
50.0000 mg | ORAL_TABLET | Freq: Every day | ORAL | 3 refills | Status: DC
Start: 2018-06-12 — End: 2018-06-16

## 2018-06-12 NOTE — Telephone Encounter (Signed)
Patient advised as below. Will call back to schedule appt

## 2018-06-12 NOTE — Telephone Encounter (Signed)
Sent in Losartan 50 mg daily. Please discontinue Edarbyclor and start this medication. Please schedule OV for 2 months to check blood pressure.

## 2018-06-16 ENCOUNTER — Telehealth: Payer: Self-pay | Admitting: Physician Assistant

## 2018-06-16 DIAGNOSIS — I10 Essential (primary) hypertension: Secondary | ICD-10-CM

## 2018-06-16 MED ORDER — LOSARTAN POTASSIUM 100 MG PO TABS: 50.0000 mg | ORAL_TABLET | Freq: Every day | ORAL | 0 refills | Status: DC

## 2018-06-16 NOTE — Telephone Encounter (Signed)
Sent in 100 mg pills, take 1/2 pill daily.

## 2018-06-16 NOTE — Telephone Encounter (Signed)
[facility] mg in [facility].  Pharmacy said they can fill it for 100 mg for pt to cut Rx in half.  Pt asking what Fabio Bering would suggest for him to have the BP Rx filled asap. He is getting headache.  Please advise.  Thanks, [employer]

## 2018-06-17 NOTE — Telephone Encounter (Signed)
Patient advised as below.  

## 2018-07-06 DIAGNOSIS — M545 Low back pain: Secondary | ICD-10-CM | POA: Diagnosis not present

## 2018-07-06 DIAGNOSIS — Z79899 Other long term (current) drug therapy: Secondary | ICD-10-CM | POA: Diagnosis not present

## 2018-07-06 DIAGNOSIS — G894 Chronic pain syndrome: Secondary | ICD-10-CM | POA: Diagnosis not present

## 2018-07-17 DIAGNOSIS — M5416 Radiculopathy, lumbar region: Secondary | ICD-10-CM | POA: Diagnosis not present

## 2018-07-17 DIAGNOSIS — M545 Low back pain: Secondary | ICD-10-CM | POA: Diagnosis not present

## 2018-07-22 ENCOUNTER — Encounter: Payer: Self-pay | Admitting: Physician Assistant

## 2018-07-22 ENCOUNTER — Ambulatory Visit: Payer: 59 | Admitting: Physician Assistant

## 2018-07-22 VITALS — BP 151/97 | HR 76 | Temp 98.3°F | Resp 16 | Wt 297.0 lb

## 2018-07-22 DIAGNOSIS — F4320 Adjustment disorder, unspecified: Secondary | ICD-10-CM

## 2018-07-22 DIAGNOSIS — I10 Essential (primary) hypertension: Secondary | ICD-10-CM

## 2018-07-22 DIAGNOSIS — R0981 Nasal congestion: Secondary | ICD-10-CM

## 2018-07-22 DIAGNOSIS — R220 Localized swelling, mass and lump, head: Secondary | ICD-10-CM

## 2018-07-22 DIAGNOSIS — R21 Rash and other nonspecific skin eruption: Secondary | ICD-10-CM | POA: Diagnosis not present

## 2018-07-22 MED ORDER — TRIAMCINOLONE ACETONIDE 0.1 % EX CREA
1.0000 | TOPICAL_CREAM | Freq: Two times a day (BID) | CUTANEOUS | 0 refills | Status: DC
Start: 2018-07-22 — End: 2018-10-29

## 2018-07-22 MED ORDER — SERTRALINE HCL 50 MG PO TABS: 50.0000 mg | ORAL_TABLET | Freq: Every day | ORAL | 0 refills | Status: DC

## 2018-07-22 MED ORDER — LOSARTAN POTASSIUM 100 MG PO TABS: 100.0000 mg | ORAL_TABLET | Freq: Every day | ORAL | 3 refills | Status: DC

## 2018-07-22 NOTE — Patient Instructions (Signed)
Flonase one spray each nostril twice a day.    Hypertension Hypertension is another name for high blood pressure. High blood pressure forces your heart to work harder to pump blood. This can cause problems over time. There are two numbers in a blood pressure reading. There is a top number (systolic) over a bottom number (diastolic). It is best to have a blood pressure below 120/80. Healthy choices can help lower your blood pressure. You may need medicine to help lower your blood pressure if:  Your blood pressure cannot be lowered with healthy choices.  Your blood pressure is higher than 130/80. Follow these instructions at home: Eating and drinking   If directed, follow the DASH eating plan. This diet includes: ? Filling half of your plate at each meal with fruits and vegetables. ? Filling one quarter of your plate at each meal with whole grains. Whole grains include whole wheat pasta, brown rice, and whole grain bread. ? Eating or drinking low-fat dairy products, such as skim milk or low-fat yogurt. ? Filling one quarter of your plate at each meal with low-fat (lean) proteins. Low-fat proteins include fish, skinless chicken, eggs, beans, and tofu. ? Avoiding fatty meat, cured and processed meat, or chicken with skin. ? Avoiding premade or processed food.  Eat less than 1,500 mg of salt (sodium) a day.  Limit alcohol use to no more than 1 drink a day for nonpregnant women and 2 drinks a day for men. One drink equals 12 oz of beer, 5 oz of wine, or 1 oz of hard liquor. Lifestyle  Work with your doctor to stay at a healthy weight or to lose weight. Ask your doctor what the best weight is for you.  Get at least 30 minutes of exercise that causes your heart to beat faster (aerobic exercise) most days of the week. This may include walking, swimming, or biking.  Get at least 30 minutes of exercise that strengthens your muscles (resistance exercise) at least 3 days a week. This may include  lifting weights or pilates.  Do not use any products that contain nicotine or tobacco. This includes cigarettes and e-cigarettes. If you need help quitting, ask your doctor.  Check your blood pressure at home as told by your doctor.  Keep all follow-up visits as told by your doctor. This is important. Medicines  Take over-the-counter and prescription medicines only as told by your doctor. Follow directions carefully.  Do not skip doses of blood pressure medicine. The medicine does not work as well if you skip doses. Skipping doses also puts you at risk for problems.  Ask your doctor about side effects or reactions to medicines that you should watch for. Contact a doctor if:  You think you are having a reaction to the medicine you are taking.  You have headaches that keep coming back (recurring).  You feel dizzy.  You have swelling in your ankles.  You have trouble with your vision. Get help right away if:  You get a very bad headache.  You start to feel confused.  You feel weak or numb.  You feel faint.  You get very bad pain in your: ? Chest. ? Belly (abdomen).  You throw up (vomit) more than once.  You have trouble breathing. Summary  Hypertension is another name for high blood pressure.  Making healthy choices can help lower blood pressure. If your blood pressure cannot be controlled with healthy choices, you may need to take medicine. This information is not  intended to replace advice given to you by your health care provider. Make sure you discuss any questions you have with your health care provider. Document Released: 11/06/2007 Document Revised: 04/17/2016 Document Reviewed: 04/17/2016 Elsevier Interactive Patient Education  2019 [employer].

## 2018-07-22 NOTE — Progress Notes (Signed)
Patient: Aaron [name] Male    DOB: 10-30-1965   53 y.o.   MRN: [id number] Visit Date: 07/23/2018  Today's Provider: Trinna Post, PA-C   Chief Complaint  Patient presents with  . Referral   Subjective:     HPI   Patient with history of HTN presenting today for depression. Reports his wife has been sick with AML and is being treated between [facility] and [facility]. Report's his mother has been diagnosed with a Pancoast tumor and he is being treated as well. He has felt depression and anxiety related to this. He called his insurance who told him it would be advisable to see a psychiatrist and he wants a referral for this. He is still out of work due to back pain.  HTN  His edarbyclor was switched to losartan 50 mg due to cost. He is taking 50 mg losartan daily without issues.   BP Readings from Last 3 Encounters:  07/22/18 (!) 151/97  05/20/18 110/84  10/22/17 114/72   Rash  He also reports a rash on the outside of both ankles that itches.   Also reports he is having nasal congestion and drainage.   No Known Allergies   Current Outpatient Medications:  .  cyclobenzaprine (FLEXERIL) 10 MG tablet, Take 10 mg by mouth at bedtime., Disp: , Rfl:  .  gabapentin (NEURONTIN) 300 MG capsule, Take 300 mg by mouth 3 (three) times daily. 1 capsule in the morning and 2 capsules at night, Disp: , Rfl:  .  HYDROcodone-acetaminophen (NORCO/VICODIN) 5-325 MG tablet, Take by mouth., Disp: , Rfl:  .  meloxicam (MOBIC) 15 MG tablet, Take 1 tablet (15 mg total) by mouth daily as needed for pain., Disp: 30 tablet, Rfl: 0 .  losartan (COZAAR) 100 MG tablet, Take 1 tablet (100 mg total) by mouth daily., Disp: 90 tablet, Rfl: 3 .  potassium chloride (K-DUR) 10 MEQ tablet, Take 1 tablet (10 mEq total) by mouth 2 (two) times daily. (Patient not taking: Reported on 07/22/2018), Disp: 180 tablet, Rfl: 0 .  sertraline (ZOLOFT) 50 MG tablet, Take 1 tablet (50 mg total) by mouth daily., Disp: 90 tablet,  Rfl: 0 .  traZODone (DESYREL) 50 MG tablet, TAKE 1 TABLET NIGHTLY AS   NEEDED FOR SLEEP, Disp: , Rfl:  .  triamcinolone cream (KENALOG) 0.1 %, Apply 1 application topically 2 (two) times daily., Disp: 30 g, Rfl: 0  Review of Systems  Social History   Tobacco Use  . Smoking status: Never Smoker  . Smokeless tobacco: Never Used  Substance Use Topics  . Alcohol use: Never    Frequency: Never      Objective:   BP (!) 151/97 (BP Location: Left Arm, Patient Position: Sitting, Cuff Size: Large)   Pulse 76   Temp 98.3 F (36.8 C) (Oral)   Resp 16   Wt 297 lb (134.7 kg)   BMI 45.16 kg/m  Vitals:   07/22/18 1543  BP: (!) 151/97  Pulse: 76  Resp: 16  Temp: 98.3 F (36.8 C)  TempSrc: Oral  Weight: 297 lb (134.7 kg)     Physical Exam Constitutional:      Appearance: Normal appearance. He is normal weight.  Cardiovascular:     Rate and Rhythm: Normal rate and regular rhythm.     Heart sounds: Normal heart sounds.  Pulmonary:     Effort: Pulmonary effort is normal.     Breath sounds: Normal breath sounds.  Skin:  General: Skin is warm and dry.       Neurological:     Mental Status: He is oriented to person, place, and time. Mental status is at baseline.  Psychiatric:        Mood and Affect: Mood is depressed.        Behavior: Behavior normal.         Assessment & Plan    1. Adjustment disorder, unspecified type  Offered to treat him for his anxiety and depression here, but he would like to see psychiatrist. We have started him on zoloft in the mean time. Counseled on side effects and how to take it. Also offered to set up with counseling but he declines at this time.  - Ambulatory referral to Psychiatry - sertraline (ZOLOFT) 50 MG tablet; Take 1 tablet (50 mg total) by mouth daily.  Dispense: 90 tablet; Refill: 0  2. Essential hypertension  BP uncontrolled, will increase losartan to 100 mg daily.   - losartan (COZAAR) 100 MG tablet; Take 1 tablet (100 mg  total) by mouth daily.  Dispense: 90 tablet; Refill: 3  3. Skin rash  - triamcinolone cream (KENALOG) 0.1 %; Apply 1 application topically 2 (two) times daily.  Dispense: 30 g; Refill: 0  4. Nasal swelling  Advised to take daily 2nd gen antihistamine and flonase and call back if worsening.  5. Nasal congestion   The entirety of the information documented in the History of Present Illness, Review of Systems and Physical Exam were personally obtained by me. Portions of this information were initially documented by Lynford Humphrey, CMA and reviewed by me for thoroughness and accuracy.   Return in about 4 months (around 11/20/2018) for HTN.       Trinna Post, PA-C  [facility]

## 2018-07-30 ENCOUNTER — Telehealth: Payer: Self-pay | Admitting: Physician Assistant

## 2018-07-30 DIAGNOSIS — J0110 Acute frontal sinusitis, unspecified: Secondary | ICD-10-CM

## 2018-07-30 NOTE — Telephone Encounter (Signed)
°  Pt was letting Fabio Bering know that his sinus issues haven't gotten better. He hasn't heard back from the referral yet.  Please advise and call pt back at [phone].  Thanks, [employer]

## 2018-07-31 MED ORDER — AMOXICILLIN-POT CLAVULANATE 875-125 MG PO TABS: 1.0000 | ORAL_TABLET | Freq: Two times a day (BID) | ORAL | 0 refills | Status: AC

## 2018-07-31 NOTE — Telephone Encounter (Signed)
augmentin one pill twice daily x 7 days sent in. Psychiatry is likely still moving down the call list.

## 2018-08-03 ENCOUNTER — Other Ambulatory Visit: Payer: Self-pay | Admitting: Physician Assistant

## 2018-08-03 DIAGNOSIS — G8929 Other chronic pain: Secondary | ICD-10-CM

## 2018-08-03 DIAGNOSIS — M549 Dorsalgia, unspecified: Principal | ICD-10-CM

## 2018-08-27 ENCOUNTER — Other Ambulatory Visit: Payer: Self-pay | Admitting: Physician Assistant

## 2018-08-27 DIAGNOSIS — I10 Essential (primary) hypertension: Secondary | ICD-10-CM

## 2018-08-29 ENCOUNTER — Other Ambulatory Visit: Payer: Self-pay | Admitting: Physician Assistant

## 2018-08-29 DIAGNOSIS — G8929 Other chronic pain: Secondary | ICD-10-CM

## 2018-08-29 DIAGNOSIS — M549 Dorsalgia, unspecified: Principal | ICD-10-CM

## 2018-08-31 DIAGNOSIS — G894 Chronic pain syndrome: Secondary | ICD-10-CM | POA: Diagnosis not present

## 2018-08-31 DIAGNOSIS — M545 Low back pain: Secondary | ICD-10-CM | POA: Diagnosis not present

## 2018-08-31 NOTE — Telephone Encounter (Signed)
Please review

## 2018-09-02 ENCOUNTER — Other Ambulatory Visit: Payer: Self-pay

## 2018-09-02 ENCOUNTER — Ambulatory Visit (INDEPENDENT_AMBULATORY_CARE_PROVIDER_SITE_OTHER): Payer: 59 | Admitting: Psychiatry

## 2018-09-02 ENCOUNTER — Encounter: Payer: Self-pay | Admitting: Psychiatry

## 2018-09-02 VITALS — BP 193/115 | HR 102 | Temp 98.2°F | Wt 296.8 lb

## 2018-09-02 DIAGNOSIS — R011 Cardiac murmur, unspecified: Secondary | ICD-10-CM | POA: Insufficient documentation

## 2018-09-02 DIAGNOSIS — F321 Major depressive disorder, single episode, moderate: Secondary | ICD-10-CM

## 2018-09-02 DIAGNOSIS — N200 Calculus of kidney: Secondary | ICD-10-CM | POA: Insufficient documentation

## 2018-09-02 DIAGNOSIS — F419 Anxiety disorder, unspecified: Secondary | ICD-10-CM | POA: Diagnosis not present

## 2018-09-02 DIAGNOSIS — F988 Other specified behavioral and emotional disorders with onset usually occurring in childhood and adolescence: Secondary | ICD-10-CM | POA: Insufficient documentation

## 2018-09-02 DIAGNOSIS — I10 Essential (primary) hypertension: Secondary | ICD-10-CM | POA: Insufficient documentation

## 2018-09-02 MED ORDER — SERTRALINE HCL 100 MG PO TABS: 100.0000 mg | ORAL_TABLET | Freq: Every day | ORAL | 0 refills | Status: DC

## 2018-09-02 MED ORDER — BUSPIRONE HCL 10 MG PO TABS: 10.0000 mg | ORAL_TABLET | Freq: Two times a day (BID) | ORAL | 0 refills | Status: DC

## 2018-09-02 NOTE — Progress Notes (Signed)
Psychiatric Initial Adult Assessment   Patient Identification: Aaron [name] MRN:  [id number] Date of Evaluation:  09/02/2018 Referral Source: Aaron Redwood PA Chief Complaint:  ' I am here to establish care." Chief Complaint    Establish Care; Anxiety; Depression     Visit Diagnosis:    ICD-10-CM   1. 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 Anxiety disorder, unspecified type F41.9 sertraline (ZOLOFT) 100 MG tablet    busPIRone (BUSPAR) 10 MG tablet    History of Present Illness:  Aaron [name] is a 53 year old Caucasian male, currently on disability, lives in [place], has a history of depression, anxiety, morbid obesity, chronic pain, essential hypertension presented to the clinic today to establish care.  Patient reports he has been struggling with mood symptoms since the past few months.  His wife of 28 years got diagnosed with leukemia in November 2019.  Patient reports he has to take at [facility] tomorrow for an appointment to start a clinical trial of medications.  He reports the doctors have given her 10% chance of success with treatment.  Patient became very tearful when he discussed this.  Patient also reports his mother has pancreatic cancer and has metastasis to the brain.  Patient reports with all this going on he has been struggling with sadness, crying spells, irritability, lack of motivation, low energy, lack of appetite and sewn on a regular basis.  He reports he was started on Zoloft by his primary provider a month ago.  He does not think the medication is effective yet.  He denies any side effects.  Patient reports significant anxiety since the past several months.  He reports with everything going on he has a lot of racing thoughts as well as restlessness and nervousness all day.  He is unable to sit still and worries all the time.  He does not think the medication is helping with his  anxiety symptoms.  Patient reports sleep as restless on and off.  He however reports he is on pain medications as well as muscle relaxant which helps to some extent.  Patient denies any suicidality, homicidality or perceptual disturbances.  Patient denies any history of manic or hypomanic symptoms.  Patient denies any substance abuse problems.  Patient denies any history of trauma.  Patient reports he has a son and a daughter.  He has good relationship with them.  Patient also reports he is anxious about the coronavirus outbreak and that is also making his mood symptoms worse.  Associated Signs/Symptoms: Depression Symptoms:  depressed mood, psychomotor retardation, fatigue, difficulty concentrating, anxiety, (Hypo) Manic Symptoms:  Denies Anxiety Symptoms:  Excessive Worry, Psychotic Symptoms:  Denies PTSD Symptoms: Negative  Past Psychiatric History: Patient was recently diagnosed with anxiety and was started on Zoloft by his primary medical doctor.  Patient denies inpatient mental health admissions.  Patient denies any suicide attempts.  Previous Psychotropic Medications: Yes Zoloft  Substance Abuse History in the last 12 months:  No.  Consequences of Substance Abuse: Negative  Past Medical History:  Past Medical History:  Diagnosis Date  . Hypertension   . Kidney stone     Past Surgical History:  Procedure Laterality Date  . VASECTOMY      Family Psychiatric History: Patient reports his mother has history of depression and is currently on BuSpar  Family History:  Family History  Problem Relation Age of Onset  . COPD Mother   .  Hypertension Father   . Congestive Heart Failure Father   . COPD Father   . Lung cancer Father   . Hypertension Sister   . Diabetes Paternal Grandfather   . Healthy Son   . Healthy Daughter     Social History:   Social History   Socioeconomic History  . Marital status: Married    Spouse name: Aaron [name]  . Number of  children: 2  . Years of education: Not on file  . Highest education level: High school graduate  Occupational History  . Not on file  Social Needs  . Financial resource strain: Not hard at all  . Food insecurity:    Worry: Never true    Inability: Never true  . Transportation needs:    Medical: No    Non-medical: No  Tobacco Use  . Smoking status: Never Smoker  . Smokeless tobacco: Never Used  Substance and Sexual Activity  . Alcohol use: Never    Frequency: Never  . Drug use: Never  . Sexual activity: Not Currently  Lifestyle  . Physical activity:    Days per week: 2 days    Minutes per session: 30 min  . Stress: Very much  Relationships  . Social connections:    Talks on phone: Not on file    Gets together: Not on file    Attends religious service: Never    Active member of club or organization: No    Attends meetings of clubs or organizations: Never    Relationship status: Married  Other Topics Concern  . Not on file  Social History Narrative  . Not on file    Additional Social History: Patient is married.  He has been living with his wife ,married for the past 28 years.  He has a 20 year old son who lives with him.  He also has a 57 year old daughter who is married and lives close to him.  He has 2 grandchildren.  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  Patient reports he went on disability due to his back problems.  Allergies:  No Known Allergies  Metabolic Disorder Labs: Lab Results  Component Value Date   HGBA1C 6.1 (H) 05/20/2018   No results found for: PROLACTIN No results found for: CHOL, TRIG, HDL, CHOLHDL, VLDL, LDLCALC No results found for: TSH  Therapeutic Level Labs: No results found for: LITHIUM No results found for: CBMZ No results found for: VALPROATE  Current Medications: Current Outpatient Medications  Medication Sig Dispense Refill  . cyclobenzaprine (FLEXERIL) 10 MG tablet Take 10 mg by mouth at  bedtime.    . gabapentin (NEURONTIN) 300 MG capsule Take 300 mg by mouth 3 (three) times daily. 1 capsule in the morning and 2 capsules at night    . HYDROcodone-acetaminophen (NORCO) 10-325 MG tablet Take by mouth.    . losartan (COZAAR) 100 MG tablet TAKE 1/2 TABLET BY MOUTH DAILY 45 tablet 0  . meloxicam (MOBIC) 15 MG tablet Take 1/2 tablet once daily SPARINGLY as needed for pain. 30 tablet 0  . triamcinolone cream (KENALOG) 0.1 % Apply 1 application topically 2 (two) times daily. 30 g 0  . busPIRone (BUSPAR) 10 MG tablet Take 1 tablet (10 mg total) by mouth 2 (two) times daily. 180 tablet 0  . potassium chloride (K-DUR) 10 MEQ tablet Take 1 tablet (10 mEq total) by mouth 2 (two) times daily. (Patient not taking: Reported on 07/22/2018) 180 tablet 0  . sertraline (ZOLOFT)  100 MG tablet Take 1 tablet (100 mg total) by mouth daily. 90 tablet 0   No current facility-administered medications for this visit.     Musculoskeletal: Strength & Muscle Tone: within normal limits Gait & Station: normal Patient leans: N/A  Psychiatric Specialty Exam: Review of Systems  Psychiatric/Behavioral: Positive for depression. The patient is nervous/anxious.   All other systems reviewed and are negative.   Blood pressure (!) 193/115, pulse (!) 102, temperature 98.2 F (36.8 C), temperature source Oral, weight 296 lb 12.8 oz (134.6 kg).Body mass index is 45.13 kg/m.  General Appearance: Casual  Eye Contact:  Fair  Speech:  Clear and Coherent  Volume:  Normal  Mood:  Anxious and Depressed  Affect:  Tearful  Thought Process:  Goal Directed and Descriptions of Associations: Intact  Orientation:  Full (Time, Place, and Person)  Thought Content:  Logical  Suicidal Thoughts:  No  Homicidal Thoughts:  No  Memory:  Immediate;   Fair Recent;   Fair Remote;   Fair  Judgement:  Fair  Insight:  Fair  Psychomotor Activity:  Normal  Concentration:  Concentration: Fair and Attention Span: Fair  Recall:  [employer] of Knowledge:Fair  Language: Fair  Akathisia:  No  Handed:  Right  AIMS (if indicated):  NA  Assets:  Communication Skills Desire for Improvement Social Support  ADL's:  Intact  Cognition: WNL  Sleep:  improving   Screenings: PHQ2-9     Office Visit from 05/20/2018 in [place] Visit from 10/22/2017 in [place]  PHQ-2 Total Score  0  0  PHQ-9 Total Score  -  6      Assessment and Plan: Arne is a 53 year old Caucasian male who is married, currently on disability from his work, lives in [place], has a history of anxiety and depressive symptoms, morbid obesity, essential hypertension, chronic pain, presented to clinic today to establish care.  Patient is biologically predisposed given his history of medical problems as well as family history of mental health problems.  Patient also has psychosocial stressors of his wife and mother being diagnosed with cancer, his own health problems as well as the current pandemic that is going on.  Patient will benefit from medication management as well as psychotherapy sessions.  Plan as noted below.  Plan MDD -unstable Increase Zoloft to 100 mg p.o. daily Start BuSpar 10 mg p.o. twice daily PHQ 9 equals 18  For anxiety disorder unspecified Zoloft and BuSpar as prescribed. GAD 7 equals 18  I have referred patient to start psychotherapy session with therapist here in clinic.  Have given him a letter extending his time off from work for next 1 month.  The letter was typed up and printed out and given to patient today.  Discussed getting TSH done- will order labs.  Discussed with patient about his elevated blood pressure reading today-patient reports its likely anxiety induced.  Discussed with patient to monitor his blood pressure at home and go back to his primary medical doctor.  Follow-up in 2 weeks or sooner if needed.  I have spent atleast 60 minutes face to face with patient today. More than 50  % of the time was spent for psychoeducation and supportive psychotherapy and care coordination.  This note was generated in part or whole with voice recognition software. Voice recognition is usually quite accurate but there are transcription errors that can and very often do occur. I apologize for any typographical errors that were not  detected and corrected.        Ursula Alert, MD 4/2/20208:43 AM

## 2018-09-02 NOTE — Patient Instructions (Signed)
Sertraline tablets What is this medicine? SERTRALINE (SER tra leen) is used to treat depression. It may also be used to treat obsessive compulsive disorder, panic disorder, post-trauma stress, premenstrual dysphoric disorder (PMDD) or social anxiety. This medicine may be used for other purposes; ask your health care provider or pharmacist if you have questions. COMMON BRAND NAME(S): Zoloft What should I tell my health care provider before I take this medicine? They need to know if you have any of these conditions: -bleeding disorders -bipolar disorder or a family history of bipolar disorder -glaucoma -heart disease -high blood pressure -history of irregular heartbeat -history of low levels of calcium, magnesium, or potassium in the blood -if you often drink alcohol -liver disease -receiving electroconvulsive therapy -seizures -suicidal thoughts, plans, or attempt; a previous suicide attempt by you or a family member -take medicines that treat or prevent blood clots -thyroid disease -an unusual or allergic reaction to sertraline, other medicines, foods, dyes, or preservatives -pregnant or trying to get pregnant -breast-feeding How should I use this medicine? Take this medicine by mouth with a glass of water. Follow the directions on the prescription label. You can take it with or without food. Take your medicine at regular intervals. Do not take your medicine more often than directed. Do not stop taking this medicine suddenly except upon the advice of your doctor. Stopping this medicine too quickly may cause serious side effects or your condition may worsen. A special MedGuide will be given to you by the pharmacist with each prescription and refill. Be sure to read this information carefully each time. Talk to your pediatrician regarding the use of this medicine in children. While this drug may be prescribed for children as young as 7 years for selected conditions, precautions do  apply. Overdosage: If you think you have taken too much of this medicine contact a poison control center or emergency room at once. NOTE: This medicine is only for you. Do not share this medicine with others. What if I miss a dose? If you miss a dose, take it as soon as you can. If it is almost time for your next dose, take only that dose. Do not take double or extra doses. What may interact with this medicine? Do not take this medicine with any of the following medications: -cisapride -dofetilide -dronedarone -linezolid -MAOIs like Carbex, Eldepryl, Marplan, Nardil, and Parnate -methylene blue (injected into a vein) -pimozide -thioridazine This medicine may also interact with the following medications: -alcohol -amphetamines -aspirin and aspirin-like medicines -certain medicines for depression, anxiety, or psychotic disturbances -certain medicines for fungal infections like ketoconazole, fluconazole, posaconazole, and itraconazole -certain medicines for irregular heart beat like flecainide, quinidine, propafenone -certain medicines for migraine headaches like almotriptan, eletriptan, frovatriptan, naratriptan, rizatriptan, sumatriptan, zolmitriptan -certain medicines for sleep -certain medicines for seizures like carbamazepine, valproic acid, phenytoin -certain medicines that treat or prevent blood clots like warfarin, enoxaparin, dalteparin -cimetidine -digoxin -diuretics -fentanyl -isoniazid -lithium -NSAIDs, medicines for pain and inflammation, like ibuprofen or naproxen -other medicines that prolong the QT interval (cause an abnormal heart rhythm) -rasagiline -safinamide -supplements like St. John's wort, kava kava, valerian -tolbutamide -tramadol -tryptophan This list may not describe all possible interactions. Give your health care provider a list of all the medicines, herbs, non-prescription drugs, or dietary supplements you use. Also tell them if you smoke, drink  alcohol, or use illegal drugs. Some items may interact with your medicine. What should I watch for while using this medicine? Tell your doctor if your symptoms  do not get better or if they get worse. Visit your doctor or health care professional for regular checks on your progress. Because it may take several weeks to see the full effects of this medicine, it is important to continue your treatment as prescribed by your doctor. Patients and their families should watch out for new or worsening thoughts of suicide or depression. Also watch out for sudden changes in feelings such as feeling anxious, agitated, panicky, irritable, hostile, aggressive, impulsive, severely restless, overly excited and hyperactive, or not being able to sleep. If this happens, especially at the beginning of treatment or after a change in dose, call your health care professional. Dennis Bast may get drowsy or dizzy. Do not drive, use machinery, or do anything that needs mental alertness until you know how this medicine affects you. Do not stand or sit up quickly, especially if you are an older patient. This reduces the risk of dizzy or fainting spells. Alcohol may interfere with the effect of this medicine. Avoid alcoholic drinks. Your mouth may get dry. Chewing sugarless gum or sucking hard candy, and drinking plenty of water may help. Contact your doctor if the problem does not go away or is severe. What side effects may I notice from receiving this medicine? Side effects that you should report to your doctor or health care professional as soon as possible: -allergic reactions like skin rash, itching or hives, swelling of the face, lips, or tongue -anxious -black, tarry stools -changes in vision -confusion -elevated mood, decreased need for sleep, racing thoughts, impulsive behavior -eye pain -fast, irregular heartbeat -feeling faint or lightheaded, falls -feeling agitated, angry, or irritable -hallucination, loss of contact with  reality -loss of balance or coordination -loss of memory -painful or prolonged erections -restlessness, pacing, inability to keep still -seizures -stiff muscles -suicidal thoughts or other mood changes -trouble sleeping -unusual bleeding or bruising -unusually weak or tired -vomiting Side effects that usually do not require medical attention (report to your doctor or health care professional if they continue or are bothersome): -change in appetite or weight -change in sex drive or performance -diarrhea -increased sweating -indigestion, nausea -tremors This list may not describe all possible side effects. Call your doctor for medical advice about side effects. You may report side effects to FDA at 1-800-FDA-1088. Where should I keep my medicine? Keep out of the reach of children. Store at room temperature between 15 and 30 degrees C (59 and 86 degrees F). Throw away any unused medicine after the expiration date. NOTE: This sheet is a summary. It may not cover all possible information. If you have questions about this medicine, talk to your doctor, pharmacist, or health care provider.  2019 Elsevier/Gold Standard (2016-05-24 14:17:49) Buspirone tablets What is this medicine? BUSPIRONE (byoo SPYE rone) is used to treat anxiety disorders. This medicine may be used for other purposes; ask your health care provider or pharmacist if you have questions. COMMON BRAND NAME(S): BuSpar What should I tell my health care provider before I take this medicine? They need to know if you have any of these conditions: -kidney or liver disease -an unusual or allergic reaction to buspirone, other medicines, foods, dyes, or preservatives -pregnant or trying to get pregnant -breast-feeding How should I use this medicine? Take this medicine by mouth with a glass of water. Follow the directions on the prescription label. You may take this medicine with or without food. To ensure that this medicine always  works the same way for you, you should  take it either always with or always without food. Take your doses at regular intervals. Do not take your medicine more often than directed. Do not stop taking except on the advice of your doctor or health care professional. Talk to your pediatrician regarding the use of this medicine in children. Special care may be needed. Overdosage: If you think you have taken too much of this medicine contact a poison control center or emergency room at once. NOTE: This medicine is only for you. Do not share this medicine with others. What if I miss a dose? If you miss a dose, take it as soon as you can. If it is almost time for your next dose, take only that dose. Do not take double or extra doses. What may interact with this medicine? Do not take this medicine with any of the following medications: -linezolid -MAOIs like Carbex, Eldepryl, Marplan, Nardil, and Parnate -methylene blue -procarbazine This medicine may also interact with the following medications: -diazepam -digoxin -diltiazem -erythromycin -grapefruit juice -haloperidol -medicines for mental depression or mood problems -medicines for seizures like carbamazepine, phenobarbital and phenytoin -nefazodone -other medications for anxiety -rifampin -ritonavir -some antifungal medicines like itraconazole, ketoconazole, and voriconazole -verapamil -warfarin This list may not describe all possible interactions. Give your health care provider a list of all the medicines, herbs, non-prescription drugs, or dietary supplements you use. Also tell them if you smoke, drink alcohol, or use illegal drugs. Some items may interact with your medicine. What should I watch for while using this medicine? Visit your doctor or health care professional for regular checks on your progress. It may take 1 to 2 weeks before your anxiety gets better. You may get drowsy or dizzy. Do not drive, use machinery, or do anything that  needs mental alertness until you know how this drug affects you. Do not stand or sit up quickly, especially if you are an older patient. This reduces the risk of dizzy or fainting spells. Alcohol can make you more drowsy and dizzy. Avoid alcoholic drinks. What side effects may I notice from receiving this medicine? Side effects that you should report to your doctor or health care professional as soon as possible: -blurred vision or other vision changes -chest pain -confusion -difficulty breathing -feelings of hostility or anger -muscle aches and pains -numbness or tingling in hands or feet -ringing in the ears -skin rash and itching -vomiting -weakness Side effects that usually do not require medical attention (report to your doctor or health care professional if they continue or are bothersome): -disturbed dreams, nightmares -headache -nausea -restlessness or nervousness -sore throat and nasal congestion -stomach upset This list may not describe all possible side effects. Call your doctor for medical advice about side effects. You may report side effects to FDA at 1-800-FDA-1088. Where should I keep my medicine? Keep out of the reach of children. Store at room temperature below 30 degrees C (86 degrees F). Protect from light. Keep container tightly closed. Throw away any unused medicine after the expiration date. NOTE: This sheet is a summary. It may not cover all possible information. If you have questions about this medicine, talk to your doctor, pharmacist, or health care provider.  2019 Elsevier/Gold Standard (2009-12-28 18:06:11)

## 2018-09-07 ENCOUNTER — Other Ambulatory Visit: Payer: Self-pay | Admitting: *Deleted

## 2018-09-07 DIAGNOSIS — I10 Essential (primary) hypertension: Secondary | ICD-10-CM

## 2018-09-07 NOTE — Telephone Encounter (Signed)
Patient called concerning his losartan rx. Patient states he is now taking one whole tablet daily. Patient would like the rx resent with the directions rewritten, saying 1 tablet qd. Please advise?

## 2018-09-08 MED ORDER — LOSARTAN POTASSIUM 100 MG PO TABS: 100.0000 mg | ORAL_TABLET | Freq: Every day | ORAL | 0 refills | Status: DC

## 2018-09-08 NOTE — Telephone Encounter (Signed)
Yes please send for the 100 mg losartan daily we did increase it

## 2018-09-08 NOTE — Telephone Encounter (Signed)
This is from yesterday morning. Do you want me to re send a new one with new instructions?

## 2018-09-14 ENCOUNTER — Encounter: Payer: Self-pay | Admitting: Licensed Clinical Social Worker

## 2018-09-14 ENCOUNTER — Other Ambulatory Visit: Payer: Self-pay

## 2018-09-14 ENCOUNTER — Ambulatory Visit (INDEPENDENT_AMBULATORY_CARE_PROVIDER_SITE_OTHER): Payer: 59 | Admitting: Licensed Clinical Social Worker

## 2018-09-14 DIAGNOSIS — F419 Anxiety disorder, unspecified: Secondary | ICD-10-CM | POA: Diagnosis not present

## 2018-09-14 DIAGNOSIS — F321 Major depressive disorder, single episode, moderate: Secondary | ICD-10-CM

## 2018-09-14 NOTE — Progress Notes (Signed)
Virtual Visit via Telephone Note  I connected with Aaron [name] on 09/14/18 at 10:00 AM EDT by telephone and verified that I am speaking with the correct person using two identifiers.   I discussed the limitations, risks, security and privacy concerns of performing an evaluation and management service by telephone and the availability of in person appointments. I also discussed with the patient that there may be a patient responsible charge related to this service. The patient expressed understanding and agreed to proceed.  I discussed the assessment and treatment plan with the patient. The patient was provided an opportunity to ask questions and all were answered. The patient agreed with the plan and demonstrated an understanding of the instructions.   The patient was advised to call back or seek an in-person evaluation if the symptoms worsen or if the condition fails to improve as anticipated.  Alden Hipp, LCSW    Comprehensive Clinical Assessment (CCA) Note  09/14/2018 BHAVIN [name] [id number]  Visit Diagnosis:      ICD-10-CM   1. MDD (major depressive disorder), single episode, moderate (HCC) F32.1   2. Anxiety disorder, unspecified type F41.9       CCA Part One  Part One has been completed on paper by the patient.  (See scanned document in Chart Review)  CCA Part Two A  Intake/Chief Complaint:  CCA Intake With Chief Complaint CCA Part Two Date: 09/14/18 CCA Part Two Time: 1000 Chief Complaint/Presenting Problem: "I have a lot of stress right now. My wife was diagnosed with Lukiema at the beginning of December, and my mother was diagnosed with cancer at the end of December. I have to sit there and watch them both take Chemo at  the same time, it's tough."  Patients Currently Reported Symptoms/Problems: "It's like I don't really have time to be depressed becuase I have to take my wife to Chemo everyday. There's a lot of sadness when I'm by myself."  Collateral  Involvement: N/A Individual's Strengths: "I don't have."  Individual's Preferences: N/A Individual's Abilities: Good communication  Type of Services Patient Feels Are Needed: medication management, individual therapy  Initial Clinical Notes/Concerns: N/A  Mental Health Symptoms Depression:  Depression: Change in energy/activity, Difficulty Concentrating, Fatigue, Hopelessness, Increase/decrease in appetite, Irritability, Sleep (too much or little), Tearfulness, Weight gain/loss, Worthlessness  Mania:  Mania: N/A  Anxiety:   Anxiety: Difficulty concentrating, Fatigue, Irritability, Sleep, Worrying  Psychosis:  Psychosis: N/A  Trauma:  Trauma: N/A  Obsessions:  Obsessions: N/A  Compulsions:  Compulsions: N/A  Inattention:  Inattention: N/A  Hyperactivity/Impulsivity:  Hyperactivity/Impulsivity: N/A  Oppositional/Defiant Behaviors:  Oppositional/Defiant Behaviors: N/A  Borderline Personality:  Emotional Irregularity: N/A  Other Mood/Personality Symptoms:  Other Mood/Personality Symtpoms: N/A   Mental Status Exam Appearance and self-care  Stature:  Stature: Average  Weight:  Weight: Overweight  Clothing:  Clothing: Neat/clean  Grooming:  Grooming: Normal  Cosmetic use:  Cosmetic Use: None  Posture/gait:  Posture/Gait: Normal  Motor activity:  Motor Activity: Not Remarkable  Sensorium  Attention:  Attention: Normal  Concentration:  Concentration: Normal  Orientation:  Orientation: X5  Recall/memory:  Recall/Memory: Normal  Affect and Mood  Affect:  Affect: Anxious  Mood:  Mood: Anxious  Relating  Eye contact:  Eye Contact: Normal  Facial expression:     Attitude toward examiner:  Attitude Toward Examiner: Cooperative  Thought and Language  Speech flow: Speech Flow: Normal  Thought content:  Thought Content: Appropriate to mood and circumstances  Preoccupation:  Hallucinations:     Organization:     Transport planner of Knowledge:  Fund of Knowledge: Average   Intelligence:  Intelligence: Average  Abstraction:  Abstraction: Normal  Judgement:  Judgement: Normal  Reality Testing:  Reality Testing: Realistic  Insight:  Insight: Good  Decision Making:  Decision Making: Normal  Social Functioning  Social Maturity:  Social Maturity: Responsible  Social Judgement:  Social Judgement: Normal  Stress  Stressors:  Stressors: Grief/losses  Coping Ability:  Coping Ability: Normal  Skill Deficits:     Supports:      Family and Psychosocial History: Family history Marital status: Married Number of Years Married: 45 What types of issues is patient dealing with in the relationship?: Pt's wife has cancer.  Additional relationship information: N/A Are you sexually active?: No What is your sexual orientation?: Heterosexual  Has your sexual activity been affected by drugs, alcohol, medication, or emotional stress?: N/A Does patient have children?: Yes How many children?: 2 How is patient's relationship with their children?: two children: daughter 80, son 49. "Good."   Childhood History:  Childhood History By whom was/is the patient raised?: Both parents Additional childhood history information: "Good."  Description of patient's relationship with caregiver when they were a child: Mom: "Good." Dad: "Good."  Patient's description of current relationship with people who raised him/her: "Still good relationships."  How were you disciplined when you got in trouble as a child/adolescent?: "Most of the time probably a whooping."  Does patient have siblings?: Yes Number of Siblings: 1 Description of patient's current relationship with siblings: 1 sister: "Good."  Did patient suffer any verbal/emotional/physical/sexual abuse as a child?: No Did patient suffer from severe childhood neglect?: No Has patient ever been sexually abused/assaulted/raped as an adolescent or adult?: No Was the patient ever a victim of a crime or a disaster?: No Witnessed domestic  violence?: No Has patient been effected by domestic violence as an adult?: No  CCA Part Two B  Employment/Work Situation: Employment / Work Copywriter, advertising Employment situation: On disability Why is patient on disability: Back injury  How long has patient been on disability: one year Patient's job has been impacted by current illness: No What is the longest time patient has a held a job?: 14 years  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  Did You Receive Any Psychiatric Treatment/Services While in the [employer]?: (N/A) Are There Guns or Other Weapons in [name]?: Yes Types of Guns/Weapons: firearms  Are These Psychologist, educational?: Yes  Education: Education School Currently Attending: N/A Last Grade Completed: 12 Name of [place]: [place]  Did Teacher, adult education From [employer]?: Yes Did You Attend College?: Yes What Type of College Degree Do you Have?: some technical college  Did [place]?: No What Was Your Major?: N/A Did You Have Any Special Interests In School?: N/A Did You Have An Individualized Education Program (IIEP): No Did You Have Any Difficulty At School?: No  Religion: Religion/Spirituality Are You A Religious Person?: No How Might This Affect Treatment?: N/A  Leisure/Recreation: Leisure / Recreation Leisure and Hobbies: "Watch racing and sports."   Exercise/Diet: Exercise/Diet Do You Exercise?: No Have You Gained or Lost A Significant Amount of Weight in the Past Six Months?: No Do You Follow a Special Diet?: No Do You Have Any Trouble Sleeping?: No  CCA Part Two C  Alcohol/Drug Use: Alcohol / Drug Use Pain Medications: SEE MAR  Prescriptions: SEE MAR Over the Counter:  SEE MAR History of alcohol / drug use?: No history of alcohol / drug abuse                      CCA Part Three  ASAM's:  Six Dimensions of Multidimensional Assessment  Dimension 1:  Acute Intoxication and/or Withdrawal  Potential:     Dimension 2:  Biomedical Conditions and Complications:     Dimension 3:  Emotional, Behavioral, or Cognitive Conditions and Complications:     Dimension 4:  Readiness to Change:     Dimension 5:  Relapse, Continued use, or Continued Problem Potential:     Dimension 6:  Recovery/Living Environment:      Substance use Disorder (SUD)    Social Function:  Social Functioning Social Maturity: Responsible Social Judgement: Normal  Stress:  Stress Stressors: Grief/losses Coping Ability: Normal Patient Takes Medications The Way The Doctor Instructed?: Yes Priority Risk: Low Acuity  Risk Assessment- Self-Harm Potential: Risk Assessment For Self-Harm Potential Thoughts of Self-Harm: No current thoughts Method: No plan Availability of Means: No access/NA Additional Comments for Self-Harm Potential: N/A  Risk Assessment -Dangerous to Others Potential: Risk Assessment For Dangerous to Others Potential Method: No Plan Availability of Means: No access or NA Intent: Vague intent or NA Notification Required: No need or identified person Additional Comments for Danger to Others Potential: N/A  DSM5 Diagnoses: Patient Active Problem List   Diagnosis Date Noted  . ADD (attention deficit disorder) 09/02/2018  . Hypertension 09/02/2018  . Kidney stones 09/02/2018  . Murmur 09/02/2018  . Morbid obesity ([place]) 05/21/2018  . Morbid obesity with BMI of 45.0-49.9, adult ([place]) 09/20/2015    Patient Centered Plan: Patient is on the following Treatment Plan(s):  Depression  Recommendations for Services/Supports/Treatments: Recommendations for Services/Supports/Treatments Recommendations For Services/Supports/Treatments: Medication Management, Individual Therapy  Treatment Plan Summary:    Referrals to Alternative Service(s): Referred to Alternative Service(s):   Place:   Date:   Time:    Referred to Alternative Service(s):   Place:   Date:   Time:    Referred to Alternative  Service(s):   Place:   Date:   Time:    Referred to Alternative Service(s):   Place:   Date:   Time:     Alden Hipp , LCSW

## 2018-09-17 ENCOUNTER — Ambulatory Visit (INDEPENDENT_AMBULATORY_CARE_PROVIDER_SITE_OTHER): Payer: 59 | Admitting: Psychiatry

## 2018-09-17 ENCOUNTER — Encounter: Payer: Self-pay | Admitting: Psychiatry

## 2018-09-17 ENCOUNTER — Other Ambulatory Visit: Payer: Self-pay

## 2018-09-17 DIAGNOSIS — F321 Major depressive disorder, single episode, moderate: Secondary | ICD-10-CM | POA: Diagnosis not present

## 2018-09-17 DIAGNOSIS — F5105 Insomnia due to other mental disorder: Secondary | ICD-10-CM | POA: Diagnosis not present

## 2018-09-17 DIAGNOSIS — F419 Anxiety disorder, unspecified: Secondary | ICD-10-CM | POA: Diagnosis not present

## 2018-09-17 NOTE — Progress Notes (Signed)
Virtual Visit via Telephone [name]  I connected with Aaron [name] on 09/17/18 at 10:45 AM EDT by telephone and verified that I am speaking with the correct person using two identifiers.   I discussed the limitations, risks, security and privacy concerns of performing an evaluation and management service by telephone and the availability of in person appointments. I also discussed with the patient that there may be a patient responsible charge related to this service. The patient expressed understanding and agreed to proceed.    I discussed the assessment and treatment plan with the patient. The patient was provided an opportunity to ask questions and all were answered. The patient agreed with the plan and demonstrated an understanding of the instructions.   The patient was advised to call back or seek an in-person evaluation if the symptoms worsen or if the condition fails to improve as anticipated.   Aaron [name]  09/17/2018 11:52 AM Aaron [name]  MRN:  [id number]  Chief Complaint:  Chief Complaint    Follow-up     HPI: Aaron [name] is a 53 year old Caucasian male, currently on disability, lives in [place], has a history of depression, anxiety, morbid obesity, chronic pain, essential hypertension was evaluated by phone today.   Patient reports he continues to struggle with his mood symptoms.  He reports he continues to have sadness, crying spells, low energy on a regular basis.  He also reports anxiety symptoms.  He reports his mother continues to get radiation therapy.  She has brain metastasis from pancreatic cancer.  He reports his wife completed fifth round of chemo therapy.  Patient reports he has to go for a follow-up appointment with his wife in 2 weeks to reevaluate whether the chemotherapy is effective or not.  Patient reports he continues to be very anxious about his situational stressors.  He reports he continues to have some racing thoughts on and off.  He reports he is  also afraid about the COVID-19 outbreak.  He is unable to go out of the house , he wants to be extra careful since his wife has immunosuppression and he does not want her to catch anything that will make her problems worse.  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  He is wanting to stay home more and get better on medications as well as therapy sessions.  Pt also reports some trouble falling asleep however reports he wants to work on his sleep hygiene before trying medications.  Patient was referred for CBT sessions and his his first appointment with Ms. Alden Hipp recently.  He is scheduled to see her soon.  He reports he is tolerating the Zoloft and the BuSpar well.  He reports he wants to stay on this dosage for a while prior to making more medication changes.  He denies any suicidality, homicidality or perceptual disturbances.  He is alert, oriented to person place and  situation.  Visit Diagnosis:    ICD-10-CM   1. MDD (major depressive disorder), single episode, moderate (HCC) F32.1   2. Anxiety disorder, unspecified type F41.9   3. Insomnia due to mental condition F51.05     Past Psychiatric History: I have reviewed past psychiatric history from my progress [name] on 09/02/2018.  Past trials of Zoloft.  Past Medical History:  Past Medical History:  Diagnosis Date  . Hypertension   . Kidney stone     Past  Surgical History:  Procedure Laterality Date  . VASECTOMY      Family Psychiatric History: Reviewed family psychiatric history from my progress [name] on 09/02/2018.  Family History:  Family History  Problem Relation Age of Onset  . COPD Mother   . Hypertension Father   . Congestive Heart Failure Father   . COPD Father   . Lung cancer Father   . Hypertension Sister   . Diabetes Paternal Grandfather   . Healthy Son   . Healthy Daughter     Social History: Reviewed social history  from my progress [name] on 09/02/2018. Social History   Socioeconomic History  . Marital status: Married    Spouse name: carisa  . Number of children: 2  . Years of education: Not on file  . Highest education level: High school graduate  Occupational History  . Not on file  Social Needs  . Financial resource strain: Not hard at all  . Food insecurity:    Worry: Never true    Inability: Never true  . Transportation needs:    Medical: No    Non-medical: No  Tobacco Use  . Smoking status: Never Smoker  . Smokeless tobacco: Never Used  Substance and Sexual Activity  . Alcohol use: Never    Frequency: Never  . Drug use: Never  . Sexual activity: Not Currently  Lifestyle  . Physical activity:    Days per week: 2 days    Minutes per session: 30 min  . Stress: Very much  Relationships  . Social connections:    Talks on phone: Not on file    Gets together: Not on file    Attends religious service: Never    Active member of club or organization: No    Attends meetings of clubs or organizations: Never    Relationship status: Married  Other Topics Concern  . Not on file  Social History Narrative  . Not on file    Allergies: No Known Allergies  Metabolic Disorder Labs: Lab Results  Component Value Date   HGBA1C 6.1 (H) 05/20/2018   No results found for: PROLACTIN No results found for: CHOL, TRIG, HDL, CHOLHDL, VLDL, LDLCALC No results found for: TSH  Therapeutic Level Labs: No results found for: LITHIUM No results found for: VALPROATE No components found for:  CBMZ  Current Medications: Current Outpatient Medications  Medication Sig Dispense Refill  . busPIRone (BUSPAR) 10 MG tablet Take 1 tablet (10 mg total) by mouth 2 (two) times daily. 180 tablet 0  . cyclobenzaprine (FLEXERIL) 10 MG tablet Take 10 mg by mouth at bedtime.    . gabapentin (NEURONTIN) 300 MG capsule Take 300 mg by mouth 3 (three) times daily. 1 capsule in the morning and 2 capsules at night    .  HYDROcodone-acetaminophen (NORCO) 10-325 MG tablet Take by mouth.    . losartan (COZAAR) 100 MG tablet Take 1 tablet (100 mg total) by mouth daily. 90 tablet 0  . meloxicam (MOBIC) 15 MG tablet Take 1/2 tablet once daily SPARINGLY as needed for pain. 30 tablet 0  . sertraline (ZOLOFT) 100 MG tablet Take 1 tablet (100 mg total) by mouth daily. 90 tablet 0  . triamcinolone cream (KENALOG) 0.1 % Apply 1 application topically 2 (two) times daily. 30 g 0  . potassium chloride (K-DUR) 10 MEQ tablet Take 1 tablet (10 mEq total) by mouth 2 (two) times daily. (Patient not taking: Reported on 07/22/2018) 180 tablet 0   No current facility-administered medications  for this visit.      Musculoskeletal: Strength & Muscle Tone: UTA Gait & Station: UTA Patient leans: N/A  Psychiatric Specialty Exam: Review of Systems  Psychiatric/Behavioral: Positive for depression. The patient is nervous/anxious and has insomnia.   All other systems reviewed and are negative.   There were no vitals taken for this visit.There is no height or weight on file to calculate BMI.  General Appearance: UTA  Eye Contact:  UTA  Speech:  Clear and Coherent  Volume:  Decreased  Mood:  Anxious and Depressed  Affect:  UTA  Thought Process:  Goal Directed and Descriptions of Associations: Intact  Orientation:  Full (Time, Place, and Person)  Thought Content: Logical   Suicidal Thoughts:  No  Homicidal Thoughts:  No  Memory:  Immediate;   Fair Recent;   Fair Remote;   Fair  Judgement:  Fair  Insight:  Fair  Psychomotor Activity:  UTA  Concentration:  Concentration: Fair and Attention Span: Fair  Recall:  [employer] of Knowledge: Fair  Language: Fair  Akathisia:  No  Handed:  Right  AIMS (if indicated): UTA  Assets:  Communication Skills Desire for Improvement Social Support  ADL's:  Intact  Cognition: WNL  Sleep:  Restless   Screenings: PHQ2-9     Office Visit from 05/20/2018 in [facility] Visit from 10/22/2017 in [place]  PHQ-2 Total Score  0  0  PHQ-9 Total Score  -  6       Assessment and Plan: Zeppelin is a 53 year old Caucasian male who is married, on disability from his work, lives in [place], has a history of depression and anxiety, morbid obesity, chronic pain, hypertension, was evaluated by phone today.  Patient is biologically predisposed given his history of medical problems, history of mental health problems in his family.  Patient also has psychosocial stressors of his wife and mother being diagnosed with cancer, his own health problems as well as the current pandemic that is going on.  Patient will continue to benefit from medication management as well as psychotherapy sessions.  Plan MDD- unstable Zoloft 100 mg p.o. daily BuSpar 10 mg p.o. twice daily  For anxiety disorder unspecified-rule out GAD Zoloft and BuSpar as prescribed  Insomnia - unspecified Patient to work on sleep hygiene  Patient advised to continue psychotherapy sessions on a more regular basis.  Pending labs-TSH.  Follow  up in clinic in 2 weeks or sooner if needed.  I have spent atleast 15 minutes non face to face with patient today. More than 50 % of the time was spent for psychoeducation and supportive psychotherapy and care coordination.  This [name] was generated in part or whole with voice recognition software. Voice recognition is usually quite accurate but there are transcription errors that can and very often do occur. I apologize for any typographical errors that were not detected and corrected.          Ursula Alert, MD 09/17/2018, 11:52 AM

## 2018-09-17 NOTE — Progress Notes (Signed)
TC on  09-17-18 @10 :11 pt medical and surgical hx was reviewed with no updates.  Pt allergies was reviewed with no changes. Pt medications and pharmacy were reviewed with no updated. No vitals taken because this is a phone consult.

## 2018-09-28 DIAGNOSIS — Z79899 Other long term (current) drug therapy: Secondary | ICD-10-CM | POA: Diagnosis not present

## 2018-09-28 DIAGNOSIS — G894 Chronic pain syndrome: Secondary | ICD-10-CM | POA: Diagnosis not present

## 2018-09-28 DIAGNOSIS — M545 Low back pain: Secondary | ICD-10-CM | POA: Diagnosis not present

## 2018-09-29 ENCOUNTER — Ambulatory Visit (INDEPENDENT_AMBULATORY_CARE_PROVIDER_SITE_OTHER): Payer: 59 | Admitting: Licensed Clinical Social Worker

## 2018-09-29 ENCOUNTER — Other Ambulatory Visit: Payer: Self-pay

## 2018-09-29 ENCOUNTER — Encounter: Payer: Self-pay | Admitting: Licensed Clinical Social Worker

## 2018-09-29 DIAGNOSIS — F321 Major depressive disorder, single episode, moderate: Secondary | ICD-10-CM | POA: Diagnosis not present

## 2018-09-29 NOTE — Progress Notes (Signed)
Virtual Visit via Telephone Note  I connected with [name] on 09/29/18 at 10:00 AM EDT by telephone and verified that I am speaking with the correct person using two identifiers.   I discussed the limitations, risks, security and privacy concerns of performing an evaluation and management service by telephone and the availability of in person appointments. I also discussed with the patient that there may be a patient responsible charge related to this service. The patient expressed understanding and agreed to proceed.  I discussed the assessment and treatment plan with the patient. The patient was provided an opportunity to ask questions and all were answered. The patient agreed with the plan and demonstrated an understanding of the instructions.   The patient was advised to call back or seek an in-person evaluation if the symptoms worsen or if the condition fails to improve as anticipated.  I provided 20 minutes of non-face-to-face time during this encounter.   Alden Hipp, LCSW    THERAPIST PROGRESS NOTE  Session Time: 1000  Participation Level: Minimal  Behavioral Response: NAAlertDepressed  Type of Therapy: Individual Therapy  Treatment Goals addressed: Coping  Interventions: Supportive  Summary: Aaron [name] is a 53 y.o. male who presents with continued symptoms of his diagnosis. Aaron [name] reports doing, "about the same," since our last session. He reports he is no longer able to go into the hospital with his wife or mother while they get their chemotherapy, which has been difficult. Aaron [name] was able to recognize that it has been a little easier not having to watch two women he loves getting treatment, but he stated he still wishes he could be in with them. He stated the most difficult thing that's happened over the last two weeks was, "My mother has stopped eating and drinking and they said if she keeps going on like that she doesn't have long left to live." LCSW  validated feelings of sadness and frustration around that situation, and asked Aaron [name] how he was coping with that information. He reported he rarely takes time to stop and think about things lately, as he feels he can worry about himself later but needs to tend to his wife and mother right now. He reported wanting to encourage his mother to eat, but she states she is unable to. LCSW validated Aaron [name]' feelings, and encouraged him to take time (even if it's five minutes) for himself to process the events of the day and attempt to recharge his batteries. Aaron [name] expressed understanding and agreement with this idea, and stated he finds that difficult. LCSW asked Aaron [name] to try square breathing over the next two weeks when he finds himself feeling overwhelmed. Aaron [name] expressed agreement with this idea as well.   Suicidal/Homicidal: No Therapist Response: Aaron [name] continues to work towards his tx goals but has not yet reached them. We will continue to work on emotional regulation skills and grounding techniques moving forward.   Plan: Return again in 2 weeks.  Diagnosis: Axis I: MDD single episode, moderate    Axis II: No diagnosis    Alden Hipp, LCSW 09/29/2018

## 2018-09-30 ENCOUNTER — Ambulatory Visit (INDEPENDENT_AMBULATORY_CARE_PROVIDER_SITE_OTHER): Payer: 59 | Admitting: Psychiatry

## 2018-09-30 ENCOUNTER — Encounter: Payer: Self-pay | Admitting: Psychiatry

## 2018-09-30 DIAGNOSIS — F321 Major depressive disorder, single episode, moderate: Secondary | ICD-10-CM

## 2018-09-30 DIAGNOSIS — F5105 Insomnia due to other mental disorder: Secondary | ICD-10-CM | POA: Diagnosis not present

## 2018-09-30 DIAGNOSIS — F411 Generalized anxiety disorder: Secondary | ICD-10-CM

## 2018-09-30 MED ORDER — ESCITALOPRAM OXALATE 10 MG PO TABS: 10.0000 mg | ORAL_TABLET | Freq: Every day | ORAL | 1 refills | Status: DC

## 2018-09-30 NOTE — Progress Notes (Signed)
Virtual Visit via Video Note  I connected with Aaron [name] on 09/30/18 at  4:30 PM EDT by a video enabled telemedicine application and verified that I am speaking with the correct person using two identifiers.   I discussed the limitations of evaluation and management by telemedicine and the availability of in person appointments. The patient expressed understanding and agreed to proceed.     I discussed the assessment and treatment plan with the patient. The patient was provided an opportunity to ask questions and all were answered. The patient agreed with the plan and demonstrated an understanding of the instructions.   The patient was advised to call back or seek an in-person evaluation if the symptoms worsen or if the condition fails to improve as anticipated.  Allene Dillon, MD  [facility] MD  OP Progress Note  09/30/2018 6:10 PM Aaron [name]  MRN:  [id number]  Chief Complaint:  Chief Complaint    Follow-up     HPI: Aaron [name] is a 53 year old Caucasian male, currently on disability, lives in [place], has a history of depression, anxiety, morbid obesity, chronic pain, essential hypertension was evaluated by telemedicine today.  Patient reports he continues to struggle with depressive symptoms as well as anxiety symptoms.  He reports sadness, low energy and some irritability.  He reports ever since he has been taking the Zoloft at the higher dosage he struggles with some mood lability and irritability.  He feels it is a side effect of his medication.  Discussed changing his Zoloft to another antidepressant and he agrees with plan.  Discussed starting Lexapro.  He has never tried it before.  Patient reports he continues to worry about everything to the extreme.  He has multiple psychosocial stressors at this time.  He is worried about his mother who is currently struggling with pancreatic cancer.  He also reports his wife has completed fifth round of chemotherapy and has an upcoming  appointment soon.  He is waiting for that.  He reports he worries about his mother dying.  He also worries about his wife getting sick with COVID-19 given the situation.  He reports his worry continues to affect his concentration and he does not believe he can function at work at this time.  He continues to be in psychotherapy session with his therapist and has upcoming appointment soon.  He denies any suicidality.  He denies any perceptual disturbances.  He reports sleep is restless due to pain however his pain medications are helpful and he is not interested in a sleep medication at this time. Visit Diagnosis:    ICD-10-CM   1. MDD (major depressive disorder), single episode, moderate (HCC) F32.1 escitalopram (LEXAPRO) 10 MG tablet  2. GAD (generalized anxiety disorder) F41.1   3. Insomnia due to mental condition F51.05     Past Psychiatric History: Reviewed past psychiatric history from my progress note on 09/02/2018.  Past trials of Zoloft.  Past Medical History:  Past Medical History:  Diagnosis Date  . Hypertension   . Kidney stone     Past Surgical History:  Procedure Laterality Date  . VASECTOMY      Family Psychiatric History: Reviewed family psychiatric history from my progress note on 09/02/2018  Family History:  Family History  Problem Relation Age of Onset  . COPD Mother   . Hypertension Father   . Congestive Heart Failure Father   . COPD Father   . Lung cancer Father   . Hypertension Sister   . Diabetes  Paternal Grandfather   . Healthy Son   . Healthy Daughter     Social History: Reviewed social history from my progress note on 09/02/2018 Social History   Socioeconomic History  . Marital status: Married    Spouse name: carisa  . Number of children: 2  . Years of education: Not on file  . Highest education level: High school graduate  Occupational History  . Not on file  Social Needs  . Financial resource strain: Not hard at all  . Food insecurity:     Worry: Never true    Inability: Never true  . Transportation needs:    Medical: No    Non-medical: No  Tobacco Use  . Smoking status: Never Smoker  . Smokeless tobacco: Never Used  Substance and Sexual Activity  . Alcohol use: Never    Frequency: Never  . Drug use: Never  . Sexual activity: Not Currently  Lifestyle  . Physical activity:    Days per week: 2 days    Minutes per session: 30 min  . Stress: Very much  Relationships  . Social connections:    Talks on phone: Not on file    Gets together: Not on file    Attends religious service: Never    Active member of club or organization: No    Attends meetings of clubs or organizations: Never    Relationship status: Married  Other Topics Concern  . Not on file  Social History Narrative  . Not on file    Allergies: No Known Allergies  Metabolic Disorder Labs: Lab Results  Component Value Date   HGBA1C 6.1 (H) 05/20/2018   No results found for: PROLACTIN No results found for: CHOL, TRIG, HDL, CHOLHDL, VLDL, LDLCALC No results found for: TSH  Therapeutic Level Labs: No results found for: LITHIUM No results found for: VALPROATE No components found for:  CBMZ  Current Medications: Current Outpatient Medications  Medication Sig Dispense Refill  . busPIRone (BUSPAR) 10 MG tablet Take 1 tablet (10 mg total) by mouth 2 (two) times daily. 180 tablet 0  . cyclobenzaprine (FLEXERIL) 10 MG tablet Take 10 mg by mouth at bedtime.    Marland Kitchen escitalopram (LEXAPRO) 10 MG tablet Take 1 tablet (10 mg total) by mouth daily. 30 tablet 1  . gabapentin (NEURONTIN) 300 MG capsule Take 300 mg by mouth 3 (three) times daily. 1 capsule in the morning and 2 capsules at night    . HYDROcodone-acetaminophen (NORCO) 10-325 MG tablet Take by mouth.    . losartan (COZAAR) 100 MG tablet Take 1 tablet (100 mg total) by mouth daily. 90 tablet 0  . meloxicam (MOBIC) 15 MG tablet Take 1/2 tablet once daily SPARINGLY as needed for pain. 30 tablet 0  .  potassium chloride (K-DUR) 10 MEQ tablet Take 1 tablet (10 mEq total) by mouth 2 (two) times daily. (Patient not taking: Reported on 07/22/2018) 180 tablet 0  . triamcinolone cream (KENALOG) 0.1 % Apply 1 application topically 2 (two) times daily. 30 g 0   No current facility-administered medications for this visit.      Musculoskeletal: Strength & Muscle Tone: UTA Gait & Station: normal Patient leans: N/A  Psychiatric Specialty Exam: Review of Systems  Psychiatric/Behavioral: Positive for depression. The patient is nervous/anxious and has insomnia.   All other systems reviewed and are negative.   There were no vitals taken for this visit.There is no height or weight on file to calculate BMI.  General Appearance: Casual  Eye  Contact:  Fair  Speech:  Clear and Coherent  Volume:  Normal  Mood:  Anxious and Depressed  Affect:  Congruent  Thought Process:  Goal Directed and Descriptions of Associations: Intact  Orientation:  Full (Time, Place, and Person)  Thought Content: Logical   Suicidal Thoughts:  No  Homicidal Thoughts:  No  Memory:  Immediate;   Fair Recent;   Fair Remote;   Fair  Judgement:  Fair  Insight:  Fair  Psychomotor Activity:  Normal  Concentration:  Concentration: Fair and Attention Span: Fair  Recall:  [employer] of Knowledge: Fair  Language: Fair  Akathisia:  No  Handed:  Right  AIMS (if indicated): denies tremors, rigidity, stiffness  Assets:  Communication Skills Desire for Improvement Social Support  ADL's:  Intact  Cognition: WNL  Sleep:  Restless   Screenings: PHQ2-9     Office Visit from 05/20/2018 in [facility] Visit from 10/22/2017 in [place]  PHQ-2 Total Score  0  0  PHQ-9 Total Score  -  6       Assessment and Plan: Brek is a 53 year old Caucasian male who is married, on disability, lives in [place], has a history of depression, anxiety, morbid obesity, chronic pain, hypertension was evaluated by  telemedicine today.  Patient is biologically predisposed given his history of medical problems, history of mental health problems in his family.  Patient also has psychosocial stressors of his wife and mother being diagnosed with cancer, his own health problems as well as the current pandemic that is going on.  Patient will continue to benefit from medication readjustment as well as psychotherapy sessions.  Plan MDD-unstable Discontinue Zoloft for side effects Start Lexapro 10 mg p.o. daily BuSpar 10 mg p.o. twice daily  For GAD-unstable Lexapro and BuSpar as prescribed Continue CBT.  For insomnia- likely due to pain Patient will continue to work on his sleep hygiene.  Pending labs-TSH  Follow-up in clinic in 2 weeks or sooner if needed.  I have spent atleast 15 minutes non face to face with patient today. More than 50 % of the time was spent for psychoeducation and supportive psychotherapy and care coordination.  This note was generated in part or whole with voice recognition software. Voice recognition is usually quite accurate but there are transcription errors that can and very often do occur. I apologize for any typographical errors that were not detected and corrected.       Ursula Alert, MD 10/01/2018, 9:06 AM

## 2018-10-02 ENCOUNTER — Telehealth: Payer: Self-pay | Admitting: Psychiatry

## 2018-10-02 NOTE — Telephone Encounter (Signed)
Completed form from [employer] [employer] - for his leave from work. Will give to [facility] CMA to fax.

## 2018-10-13 ENCOUNTER — Other Ambulatory Visit: Payer: Self-pay | Admitting: Physician Assistant

## 2018-10-13 DIAGNOSIS — F4320 Adjustment disorder, unspecified: Secondary | ICD-10-CM

## 2018-10-14 ENCOUNTER — Encounter: Payer: Self-pay | Admitting: Psychiatry

## 2018-10-14 ENCOUNTER — Other Ambulatory Visit: Payer: Self-pay

## 2018-10-14 ENCOUNTER — Ambulatory Visit (INDEPENDENT_AMBULATORY_CARE_PROVIDER_SITE_OTHER): Payer: 59 | Admitting: Psychiatry

## 2018-10-14 ENCOUNTER — Encounter: Payer: Self-pay | Admitting: Licensed Clinical Social Worker

## 2018-10-14 ENCOUNTER — Ambulatory Visit (INDEPENDENT_AMBULATORY_CARE_PROVIDER_SITE_OTHER): Payer: 59 | Admitting: Licensed Clinical Social Worker

## 2018-10-14 DIAGNOSIS — F411 Generalized anxiety disorder: Secondary | ICD-10-CM | POA: Diagnosis not present

## 2018-10-14 DIAGNOSIS — F321 Major depressive disorder, single episode, moderate: Secondary | ICD-10-CM

## 2018-10-14 DIAGNOSIS — F5105 Insomnia due to other mental disorder: Secondary | ICD-10-CM

## 2018-10-14 NOTE — Progress Notes (Signed)
Virtual Visit via Telephone Note  I connected with Aaron [name] on 10/14/18 at 11:00 AM EDT by telephone and verified that I am speaking with the correct person using two identifiers.   I discussed the limitations, risks, security and privacy concerns of performing an evaluation and management service by telephone and the availability of in person appointments. I also discussed with the patient that there may be a patient responsible charge related to this service. The patient expressed understanding and agreed to proceed.  I discussed the assessment and treatment plan with the patient. The patient was provided an opportunity to ask questions and all were answered. The patient agreed with the plan and demonstrated an understanding of the instructions.   The patient was advised to call back or seek an in-person evaluation if the symptoms worsen or if the condition fails to improve as anticipated.  I provided 25 minutes of non-face-to-face time during this encounter.   Aaron Hipp, LCSW    THERAPIST PROGRESS NOTE  Session Time: 1100  Participation Level: Minimal  Behavioral Response: NAAlertDepressed  Type of Therapy: Individual Therapy  Treatment Goals addressed: Anxiety  Interventions: CBT  Summary: Aaron [name] is a 53 y.o. male who presents with continued symptoms of his diagnosis. Aaron [name] reports things have been "the same," since our last session. He reports his mother is not doing well, "she still hasn't eaten and today she refused to go to her doctor's appointment at the cancer center. I don't think she can go on like this much longer." Aaron [name] also reported his wife got a biopsy result back and she is going to go through another round of chemotherapy. Aaron [name] articulated feeling overwhelmed and constantly thinking about what the future will look like. LCSW asked Aaron [name] to take a deep breath and think about the things he has control over in either situation. Aaron [name] reported the  only things he can control are, "taking them to their appointments and being there for them emotionally." LCSW validated Aaron [name]' answer to the question, and encouraged him to remember that when he becomes stressed about his mother or wife not doing something the doctors tell them to. Aaron [name] expressed understanding and agreement. LCSW also encouraged Aaron [name] to try to keep his focus in the moment, as there are plenty of things to worry about right now and adding the future worry is not going to help. Aaron [name] reported understanding this idea but noted it was difficult to stay in the moment. LCSW encouraged Aaron [name] to challenge thoughts as they come up if they are not related to the moment. Aaron [name] expressed agreement.   Suicidal/Homicidal: No  Therapist Response: Aaron [name] continues to work towards his tx goals but has not yet reached them. We will continue to work on improving coping skills moving forward.   Plan: Return again in 2 weeks.  Diagnosis: Axis I: MDD    Axis II: No diagnosis    Aaron Hipp, LCSW 10/14/2018

## 2018-10-14 NOTE — Progress Notes (Signed)
Virtual Visit via Video Note  I connected with Aaron [name] on 10/14/18 at  2:45 PM EDT by a video enabled telemedicine application and verified that I am speaking with the correct person using two identifiers.   I discussed the limitations of evaluation and management by telemedicine and the availability of in person appointments. The patient expressed understanding and agreed to proceed.     I discussed the assessment and treatment plan with the patient. The patient was provided an opportunity to ask questions and all were answered. The patient agreed with the plan and demonstrated an understanding of the instructions.   The patient was advised to call back or seek an in-person evaluation if the symptoms worsen or if the condition fails to improve as anticipated.  [facility] MD OP Progress Note  10/14/2018 4:42 PM Aaron [name]  MRN:  [id number]  Chief Complaint:  Chief Complaint    Follow-up     HPI: Aaron [name] is a 53 year old Caucasian male, currently on disability, lives alone, has a history of depression, anxiety, insomnia, morbid obesity, chronic pain, essential hypertension was evaluated by telemedicine today.  Patient today reports he continues to feel very depressed.  He reports he just received the news that his mother is going to hospice soon.  She struggles with cancer.  Patient became very tearful when he discussed this.  Patient reports he is tolerating the Lexapro which was started 2 weeks ago.  He denies any side effects.  He reports he is not ready to go up on the dosage yet since he wants to give it more time.  Patient report sleep is restless on and off however he has been resting as best as he can.  Patient reports he continues to struggle with the fact that his wife is also struggling with cancer.  She has leukemia.  She continues to be in chemotherapy and has to go one more round of it.  Her providers does not know how she is going to progress at this time.  He reports  he has been talking about his situation with his therapist.  He had an appointment with Ms. Alden Hipp this morning.  Discussed with patient to continue therapy sessions more frequently.  Patient denies any suicidality, homicidality or perceptual disturbances. Visit Diagnosis:    ICD-10-CM   1. MDD (major depressive disorder), single episode, moderate (HCC) F32.1   2. GAD (generalized anxiety disorder) F41.1   3. Insomnia due to mental condition F51.05     Past Psychiatric History: I have reviewed past psychiatric history from my progress note on 09/02/2018.  Past trials of Zoloft.  Past Medical History:  Past Medical History:  Diagnosis Date  . Hypertension   . Kidney stone     Past Surgical History:  Procedure Laterality Date  . VASECTOMY      Family Psychiatric History: Reviewed family psychiatric history from my progress note on 09/02/2018.  Family History:  Family History  Problem Relation Age of Onset  . COPD Mother   . Hypertension Father   . Congestive Heart Failure Father   . COPD Father   . Lung cancer Father   . Hypertension Sister   . Diabetes Paternal Grandfather   . Healthy Son   . Healthy Daughter     Social History: Reviewed social history from my progress note on 09/02/2018 Social History   Socioeconomic History  . Marital status: Married    Spouse name: carisa  . Number of children: 2  .  Years of education: Not on file  . Highest education level: High school graduate  Occupational History  . Not on file  Social Needs  . Financial resource strain: Not hard at all  . Food insecurity:    Worry: Never true    Inability: Never true  . Transportation needs:    Medical: No    Non-medical: No  Tobacco Use  . Smoking status: Never Smoker  . Smokeless tobacco: Never Used  Substance and Sexual Activity  . Alcohol use: Never    Frequency: Never  . Drug use: Never  . Sexual activity: Not Currently  Lifestyle  . Physical activity:    Days per week:  2 days    Minutes per session: 30 min  . Stress: Very much  Relationships  . Social connections:    Talks on phone: Not on file    Gets together: Not on file    Attends religious service: Never    Active member of club or organization: No    Attends meetings of clubs or organizations: Never    Relationship status: Married  Other Topics Concern  . Not on file  Social History Narrative  . Not on file    Allergies: No Known Allergies  Metabolic Disorder Labs: Lab Results  Component Value Date   HGBA1C 6.1 (H) 05/20/2018   No results found for: PROLACTIN No results found for: CHOL, TRIG, HDL, CHOLHDL, VLDL, LDLCALC No results found for: TSH  Therapeutic Level Labs: No results found for: LITHIUM No results found for: VALPROATE No components found for:  CBMZ  Current Medications: Current Outpatient Medications  Medication Sig Dispense Refill  . busPIRone (BUSPAR) 10 MG tablet Take 1 tablet (10 mg total) by mouth 2 (two) times daily. 180 tablet 0  . cyclobenzaprine (FLEXERIL) 10 MG tablet Take 10 mg by mouth at bedtime.    Marland Kitchen escitalopram (LEXAPRO) 10 MG tablet Take 1 tablet (10 mg total) by mouth daily. 30 tablet 1  . gabapentin (NEURONTIN) 300 MG capsule Take 300 mg by mouth 3 (three) times daily. 1 capsule in the morning and 2 capsules at night    . HYDROcodone-acetaminophen (NORCO) 10-325 MG tablet Take by mouth.    . losartan (COZAAR) 100 MG tablet Take 1 tablet (100 mg total) by mouth daily. 90 tablet 0  . meloxicam (MOBIC) 15 MG tablet Take 1/2 tablet once daily SPARINGLY as needed for pain. 30 tablet 0  . potassium chloride (K-DUR) 10 MEQ tablet Take 1 tablet (10 mEq total) by mouth 2 (two) times daily. (Patient not taking: Reported on 07/22/2018) 180 tablet 0  . triamcinolone cream (KENALOG) 0.1 % Apply 1 application topically 2 (two) times daily. 30 g 0   No current facility-administered medications for this visit.      Musculoskeletal: Strength & Muscle Tone:  within normal limits Gait & Station: normal Patient leans: N/A  Psychiatric Specialty Exam: Review of Systems  Psychiatric/Behavioral: Positive for depression. The patient is nervous/anxious.   All other systems reviewed and are negative.   There were no vitals taken for this visit.There is no height or weight on file to calculate BMI.  General Appearance: Casual  Eye Contact:  Fair  Speech:  Clear and Coherent  Volume:  Normal  Mood:  Anxious and Depressed  Affect:  Tearful  Thought Process:  Goal Directed and Descriptions of Associations: Intact  Orientation:  Full (Time, Place, and Person)  Thought Content: Logical   Suicidal Thoughts:  No  Homicidal Thoughts:  No  Memory:  Immediate;   Fair Recent;   Fair Remote;   Fair  Judgement:  Fair  Insight:  Fair  Psychomotor Activity:  Normal  Concentration:  Concentration: Fair and Attention Span: Fair  Recall:  [employer] of Knowledge: Fair  Language: Fair  Akathisia:  No  Handed:  Right  AIMS (if indicated): denies tremors, rigidity,stiffness  Assets:  Communication Skills Desire for Improvement Social Support  ADL's:  Intact  Cognition: WNL  Sleep:  Fair   Screenings: PHQ2-9     Office Visit from 05/20/2018 in [facility] Visit from 10/22/2017 in [facility]  PHQ-2 Total Score  0  0  PHQ-9 Total Score  -  6       Assessment and Plan: Aaron [name] is a 53 year old Caucasian male, married, disability, lives in [place], has a history of depression, anxiety, morbid obesity, chronic pain, hypertension was evaluated by telemedicine today.  Patient is biologically predisposed given his history of medical problems, history of mental health problems in his family.  He also has psychosocial stressors of his wife and his mother being diagnosed with cancer, his own health issues and the current pandemic.  Patient will continue to benefit from medication readjustment as well as psychotherapy sessions  since he continues to struggle with mood symptoms.  Plan MDD- unstable Lexapro 10 mg p.o. daily-started 2 weeks ago. BuSpar 10 mg p.o. twice daily. Will not make any medication changes today, will give it more time.   GAD- some improvement Lexapro and BuSpar as prescribed.  Continue CBT.  For insomnia-likely due to pain He will continue to work on his sleep hygiene.  I have completed his leave application form and will fax it back to his company today.  He has been taken out of work till November 02, 2018.  Patient will continue to stay on medications and monitor himself closely.  Since the new medication was recently added he will need to monitor himself for side effects and medication takes time to build up in his system to give him full benefit.  In the meantime he will see the therapist on a frequent basis.  This was discussed with patient.  Follow-up in clinic in 2 weeks or sooner if needed.  Appointment scheduled for May 26 at 3:45 pm.  I have spent atleast 25 minutes non face to face with patient today. More than 50 % of the time was spent for psychoeducation and supportive psychotherapy and care coordination.  This note was generated in part or whole with voice recognition software. Voice recognition is usually quite accurate but there are transcription errors that can and very often do occur. I apologize for any typographical errors that were not detected and corrected.          Ursula Alert, MD 10/14/2018, 4:42 PM

## 2018-10-19 ENCOUNTER — Ambulatory Visit
Admission: RE | Admit: 2018-10-19 | Discharge: 2018-10-19 | Disposition: A | Discharge status: Home / Self Care | Payer: 59 | Attending: Physician Assistant | Admitting: Physician Assistant

## 2018-10-19 ENCOUNTER — Ambulatory Visit
Admission: RE | Admit: 2018-10-19 | Discharge: 2018-10-19 | Disposition: A | Discharge status: Home / Self Care | Payer: 59 | Source: Ambulatory Visit | Attending: Physician Assistant | Admitting: Physician Assistant

## 2018-10-19 ENCOUNTER — Encounter: Payer: Self-pay | Admitting: Physician Assistant

## 2018-10-19 ENCOUNTER — Other Ambulatory Visit: Payer: Self-pay

## 2018-10-19 ENCOUNTER — Ambulatory Visit (INDEPENDENT_AMBULATORY_CARE_PROVIDER_SITE_OTHER): Payer: 59 | Admitting: Physician Assistant

## 2018-10-19 VITALS — BP 163/114 | HR 74 | Temp 98.6°F | Resp 16 | Wt 297.2 lb

## 2018-10-19 DIAGNOSIS — F1190 Opioid use, unspecified, uncomplicated: Secondary | ICD-10-CM | POA: Diagnosis not present

## 2018-10-19 DIAGNOSIS — Z1211 Encounter for screening for malignant neoplasm of colon: Secondary | ICD-10-CM | POA: Diagnosis not present

## 2018-10-19 DIAGNOSIS — R1031 Right lower quadrant pain: Secondary | ICD-10-CM

## 2018-10-19 LAB — POCT URINALYSIS DIPSTICK
Glucose, UA: NEGATIVE
Ketones, UA: NEGATIVE
Leukocytes, UA: NEGATIVE
Nitrite, UA: NEGATIVE
Protein, UA: POSITIVE — AB
Spec Grav, UA: 1.015 (ref 1.010–1.025)
Urobilinogen, UA: 0.2 E.U./dL
pH, UA: 5 (ref 5.0–8.0)

## 2018-10-19 NOTE — Progress Notes (Signed)
Patient: Aaron [name] Male    DOB: 29-Apr-1966   53 y.o.   MRN: [id number] Visit Date: 10/19/2018  Today's Provider: Trinna Post, PA-C   Chief Complaint  Patient presents with  . Flank Pain   Subjective:     Flank Pain  This is a new problem. The current episode started 1 to 4 weeks ago. The problem occurs daily. The problem has been waxing and waning since onset. The quality of the pain is described as shooting and stabbing. The pain is at a severity of 10/10. Exacerbated by: eating and passing a bowl movement. Pertinent negatives include no abdominal pain, bladder incontinence, bowel incontinence, chest pain, dysuria, fever, headaches, leg pain, numbness, paresis, paresthesias, pelvic pain, perianal numbness, tingling, weakness or weight loss. Treatments tried: hydrocodone. The treatment provided moderate relief.   Reports he has a history of hernia in his right sided groin. Reports a history of vomiting a week ago. Had diarrhea for one week and. Does not hurt to pee. No fevers, no chills, no SOB.   No Known Allergies   Current Outpatient Medications:  .  busPIRone (BUSPAR) 10 MG tablet, Take 1 tablet (10 mg total) by mouth 2 (two) times daily., Disp: 180 tablet, Rfl: 0 .  cyclobenzaprine (FLEXERIL) 10 MG tablet, Take 10 mg by mouth at bedtime., Disp: , Rfl:  .  escitalopram (LEXAPRO) 10 MG tablet, Take 1 tablet (10 mg total) by mouth daily., Disp: 30 tablet, Rfl: 1 .  gabapentin (NEURONTIN) 300 MG capsule, Take 300 mg by mouth 3 (three) times daily. 1 capsule in the morning and 2 capsules at night, Disp: , Rfl:  .  HYDROcodone-acetaminophen (NORCO) 10-325 MG tablet, Take by mouth., Disp: , Rfl:  .  losartan (COZAAR) 100 MG tablet, Take 1 tablet (100 mg total) by mouth daily., Disp: 90 tablet, Rfl: 0 .  meloxicam (MOBIC) 15 MG tablet, Take 1/2 tablet once daily SPARINGLY as needed for pain., Disp: 30 tablet, Rfl: 0 .  potassium chloride (K-DUR) 10 MEQ tablet, Take 1  tablet (10 mEq total) by mouth 2 (two) times daily. (Patient not taking: Reported on 07/22/2018), Disp: 180 tablet, Rfl: 0 .  triamcinolone cream (KENALOG) 0.1 %, Apply 1 application topically 2 (two) times daily. (Patient not taking: Reported on 10/19/2018), Disp: 30 g, Rfl: 0  Review of Systems  Constitutional: Negative for fever and weight loss.  Cardiovascular: Negative for chest pain.  Gastrointestinal: Positive for nausea. Negative for abdominal distention, abdominal pain, anal bleeding, blood in stool, bowel incontinence, constipation, diarrhea, rectal pain and vomiting.  Genitourinary: Positive for flank pain. Negative for bladder incontinence, dysuria and pelvic pain.  Neurological: Negative for tingling, weakness, numbness, headaches and paresthesias.    Social History   Tobacco Use  . Smoking status: Never Smoker  . Smokeless tobacco: Never Used  Substance Use Topics  . Alcohol use: Never    Frequency: Never      Objective:   BP (!) 163/114   Pulse 74   Temp 98.6 F (37 C) (Oral)   Resp 16   Wt 297 lb 3.2 oz (134.8 kg)   BMI 45.19 kg/m  Vitals:   10/19/18 1336  BP: (!) 163/114  Pulse: 74  Resp: 16  Temp: 98.6 F (37 C)  TempSrc: Oral  Weight: 297 lb 3.2 oz (134.8 kg)     Physical Exam Constitutional:      General: He is not in acute distress.  Appearance: Normal appearance. He is not ill-appearing.  Cardiovascular:     Rate and Rhythm: Normal rate.  Pulmonary:     Effort: Pulmonary effort is normal.     Breath sounds: Normal breath sounds.  Abdominal:     General: Bowel sounds are normal.     Palpations: Abdomen is soft.     Tenderness: There is abdominal tenderness in the right lower quadrant. There is no right CVA tenderness, left CVA tenderness, guarding or rebound.     Comments: Mild RLQ tenderness.   Skin:    General: Skin is warm and dry.  Neurological:     Mental Status: He is alert. Mental status is at baseline.  Psychiatric:         Mood and Affect: Mood normal.        Behavior: Behavior normal.         Assessment & Plan    1. Right lower quadrant abdominal pain  2 weeks of radiating RLQ abdominal pain with one week of diarrhea that has since resolved. He is afebrile and nontoxic today. Will first assess with KUB as he is on chronic narcotics and suspect constipation. Also on DDx: kidney stones, colitis, diverticulitis, and less likely appendicitis. Labs as below.  I have reviewed his labs which show no infection. His urinalysis was clear. His KUB was normal - I have personally reviewed this image and agree with findings. We will proceed with abdominal CT scan.   - DG Abd 1 View; Future - CBC with Differential - Comprehensive Metabolic Panel (CMET) - POCT Urinalysis Dipstick - CULTURE, URINE COMPREHENSIVE  2. Chronic, continuous use of opioids  Suggest he start taking two capfuls of miralax daily dissolve in liquid of choice.   3. Colon cancer screening  - Cologuard  The entirety of the information documented in the History of Present Illness, Review of Systems and Physical Exam were personally obtained by me. Portions of this information were initially documented by Jennings Books, CMA and reviewed by me for thoroughness and accuracy.   F/u PRN        Trinna Post, PA-C  [facility]

## 2018-10-19 NOTE — Patient Instructions (Signed)
Take 1 capful of miralax twice daily     Constipation, Adult Constipation is when a person:  Poops (has a bowel movement) fewer times in a week than normal.  Has a hard time pooping.  Has poop that is dry, hard, or bigger than normal. Follow these instructions at home: Eating and drinking   Eat foods that have a lot of fiber, such as: ? Fresh fruits and vegetables. ? Whole grains. ? Beans.  Eat less of foods that are high in fat, low in fiber, or overly processed, such as: ? [place] fries. ? Hamburgers. ? Cookies. ? Candy. ? Soda.  Drink enough fluid to keep your pee (urine) clear or pale yellow. General instructions  Exercise regularly or as told by your doctor.  Go to the restroom when you feel like you need to poop. Do not hold it in.  Take over-the-counter and prescription medicines only as told by your doctor. These include any fiber supplements.  Do pelvic floor retraining exercises, such as: ? Doing deep breathing while relaxing your lower belly (abdomen). ? Relaxing your pelvic floor while pooping.  Watch your condition for any changes.  Keep all follow-up visits as told by your doctor. This is important. Contact a doctor if:  You have pain that gets worse.  You have a fever.  You have not pooped for 4 days.  You throw up (vomit).  You are not hungry.  You lose weight.  You are bleeding from the anus.  You have thin, pencil-like poop (stool). Get help right away if:  You have a fever, and your symptoms suddenly get worse.  You leak poop or have blood in your poop.  Your belly feels hard or bigger than normal (is bloated).  You have very bad belly pain.  You feel dizzy or you faint. This information is not intended to replace advice given to you by your health care provider. Make sure you discuss any questions you have with your health care provider. Document Released: 11/06/2007 Document Revised: 12/08/2015 Document Reviewed: 11/08/2015  Elsevier Interactive Patient Education  2019 [employer].

## 2018-10-20 ENCOUNTER — Telehealth: Payer: Self-pay

## 2018-10-20 ENCOUNTER — Telehealth: Payer: Self-pay | Admitting: Physician Assistant

## 2018-10-20 DIAGNOSIS — R1084 Generalized abdominal pain: Secondary | ICD-10-CM

## 2018-10-20 DIAGNOSIS — R1031 Right lower quadrant pain: Secondary | ICD-10-CM

## 2018-10-20 LAB — COMPREHENSIVE METABOLIC PANEL
ALT: 27 IU/L (ref 0–44)
AST: 24 IU/L (ref 0–40)
Albumin/Globulin Ratio: 1.2 (ref 1.2–2.2)
Albumin: 4.2 g/dL (ref 3.8–4.9)
Alkaline Phosphatase: 122 IU/L — ABNORMAL HIGH (ref 39–117)
BUN/Creatinine Ratio: 17 (ref 9–20)
BUN: 17 mg/dL (ref 6–24)
Bilirubin Total: 0.4 mg/dL (ref 0.0–1.2)
CO2: 23 mmol/L (ref 20–29)
Calcium: 9.4 mg/dL (ref 8.7–10.2)
Chloride: 103 mmol/L (ref 96–106)
Creatinine, Ser: 0.98 mg/dL (ref 0.76–1.27)
GFR calc Af Amer: 102 mL/min/{1.73_m2} (ref >59)
GFR calc non Af Amer: 88 mL/min/{1.73_m2} (ref >59)
Globulin, Total: 3.4 g/dL (ref 1.5–4.5)
Glucose: 93 mg/dL (ref 65–99)
Potassium: 4 mmol/L (ref 3.5–5.2)
Sodium: 139 mmol/L (ref 134–144)
Total Protein: 7.6 g/dL (ref 6.0–8.5)

## 2018-10-20 LAB — CBC WITH DIFFERENTIAL/PLATELET
Basophils Absolute: 0.1 10*3/uL (ref 0.0–0.2)
Basos: 1 %
EOS (ABSOLUTE): 0.2 10*3/uL (ref 0.0–0.4)
Eos: 3 %
Hematocrit: 45.4 % (ref 37.5–51.0)
Hemoglobin: 15.8 g/dL (ref 13.0–17.7)
Immature Grans (Abs): 0 10*3/uL (ref 0.0–0.1)
Immature Granulocytes: 0 %
Lymphocytes Absolute: 2.9 10*3/uL (ref 0.7–3.1)
Lymphs: 40 %
MCH: 29.6 pg (ref 26.6–33.0)
MCHC: 34.8 g/dL (ref 31.5–35.7)
MCV: 85 fL (ref 79–97)
Monocytes Absolute: 0.5 10*3/uL (ref 0.1–0.9)
Monocytes: 6 %
Neutrophils Absolute: 3.5 10*3/uL (ref 1.4–7.0)
Neutrophils: 50 %
Platelets: 382 10*3/uL (ref 150–450)
RBC: 5.33 x10E6/uL (ref 4.14–5.80)
RDW: 12.9 % (ref 11.6–15.4)
WBC: 7.2 10*3/uL (ref 3.4–10.8)

## 2018-10-20 NOTE — Telephone Encounter (Signed)
CT abdomen pelvis placed.

## 2018-10-20 NOTE — Telephone Encounter (Signed)
I need office note to get auth for CT,Thanks

## 2018-10-20 NOTE — Telephone Encounter (Signed)
-----   Message from Trinna Post, [place] sent at 10/20/2018  1:36 PM EDT ----- Worthy Keeler looks normal and xray looks normal as well. Is patient still having pain? If it continues, I would consider a CT scan.

## 2018-10-20 NOTE — Telephone Encounter (Signed)
Patient advised as directed below. Per patient he still in pain would like to proceed with CT scan

## 2018-10-21 ENCOUNTER — Telehealth: Payer: Self-pay | Admitting: Physician Assistant

## 2018-10-21 DIAGNOSIS — R1084 Generalized abdominal pain: Secondary | ICD-10-CM

## 2018-10-21 LAB — CULTURE, URINE COMPREHENSIVE

## 2018-10-21 NOTE — Telephone Encounter (Signed)
Sorry I didn't know wording.It can be put in as stat

## 2018-10-21 NOTE — Telephone Encounter (Signed)
CT abdomen WITH contrast placed.

## 2018-10-21 NOTE — Telephone Encounter (Signed)
Please see below. KW 

## 2018-10-21 NOTE — Telephone Encounter (Signed)
Order placed STAT

## 2018-10-21 NOTE — Telephone Encounter (Signed)
With information given insurance company will approve CT abd/pelvis with contrast.Procedure code [id number].If you approve this I will just need a new order entered marked urgent.If not their phone number is [phone]

## 2018-10-21 NOTE — Telephone Encounter (Signed)
His note is finished. There is no urgent option, it's either normal or stat. Should I write it in the directions?

## 2018-10-21 NOTE — Telephone Encounter (Signed)
See below. KW 

## 2018-10-22 ENCOUNTER — Ambulatory Visit: Payer: 59

## 2018-10-23 ENCOUNTER — Ambulatory Visit
Admission: RE | Admit: 2018-10-23 | Discharge: 2018-10-23 | Disposition: A | Discharge status: Home / Self Care | Payer: 59 | Source: Ambulatory Visit | Attending: Physician Assistant | Admitting: Physician Assistant

## 2018-10-23 ENCOUNTER — Telehealth: Payer: Self-pay

## 2018-10-23 ENCOUNTER — Ambulatory Visit: Admission: RE | Admit: 2018-10-23 | Payer: 59 | Source: Ambulatory Visit

## 2018-10-23 ENCOUNTER — Other Ambulatory Visit: Payer: Self-pay

## 2018-10-23 ENCOUNTER — Other Ambulatory Visit: Payer: 59

## 2018-10-23 DIAGNOSIS — R1084 Generalized abdominal pain: Secondary | ICD-10-CM | POA: Diagnosis not present

## 2018-10-23 DIAGNOSIS — K4090 Unilateral inguinal hernia, without obstruction or gangrene, not specified as recurrent: Secondary | ICD-10-CM

## 2018-10-23 MED ORDER — IOHEXOL 300 MG/ML  SOLN
100.0000 mL | Freq: Once | INTRAMUSCULAR | Status: AC | PRN
  Administered 2018-10-23: 100 mL via INTRAVENOUS

## 2018-10-23 NOTE — Telephone Encounter (Signed)
Patient advised. He agrees to proceed with surgeon referral. Referral placed.

## 2018-10-23 NOTE — Telephone Encounter (Signed)
-----   Message from Trinna Post, [place] sent at 10/23/2018  9:38 AM EDT ----- CT scan showed some fat containing hernias in the groin region, right worse than left. This isn't quite in the area of his pain but it is close, may be contributing to his pain. Otherwise, his appendix didn't show any signs of infection, no evidence of kidney stones and no evidence of intestinal infection. I can refer him to surgery to assess the hernia and they might offer some advice about how to proceed.

## 2018-10-25 ENCOUNTER — Other Ambulatory Visit: Payer: Self-pay | Admitting: Physician Assistant

## 2018-10-25 DIAGNOSIS — G8929 Other chronic pain: Secondary | ICD-10-CM

## 2018-10-25 DIAGNOSIS — M549 Dorsalgia, unspecified: Secondary | ICD-10-CM

## 2018-10-27 ENCOUNTER — Encounter: Payer: Self-pay | Admitting: Psychiatry

## 2018-10-27 ENCOUNTER — Other Ambulatory Visit: Payer: Self-pay | Admitting: Psychiatry

## 2018-10-27 ENCOUNTER — Other Ambulatory Visit: Payer: Self-pay

## 2018-10-27 ENCOUNTER — Ambulatory Visit (INDEPENDENT_AMBULATORY_CARE_PROVIDER_SITE_OTHER): Payer: 59 | Admitting: Psychiatry

## 2018-10-27 DIAGNOSIS — F5105 Insomnia due to other mental disorder: Secondary | ICD-10-CM

## 2018-10-27 DIAGNOSIS — F411 Generalized anxiety disorder: Secondary | ICD-10-CM | POA: Diagnosis not present

## 2018-10-27 DIAGNOSIS — F321 Major depressive disorder, single episode, moderate: Secondary | ICD-10-CM | POA: Diagnosis not present

## 2018-10-27 MED ORDER — TRAZODONE HCL 50 MG PO TABS: 50.0000 mg | ORAL_TABLET | Freq: Every day | ORAL | 0 refills | Status: DC

## 2018-10-27 MED ORDER — ESCITALOPRAM OXALATE 10 MG PO TABS: 10.0000 mg | ORAL_TABLET | Freq: Every day | ORAL | 0 refills | Status: DC

## 2018-10-27 NOTE — Progress Notes (Signed)
Virtual Visit via Video Note  I connected with Aaron [name] on 10/27/18 at  3:45 PM EDT by a video enabled telemedicine application and verified that I am speaking with the correct person using two identifiers.   I discussed the limitations of evaluation and management by telemedicine and the availability of in person appointments. The patient expressed understanding and agreed to proceed.    I discussed the assessment and treatment plan with the patient. The patient was provided an opportunity to ask questions and all were answered. The patient agreed with the plan and demonstrated an understanding of the instructions.   The patient was advised to call back or seek an in-person evaluation if the symptoms worsen or if the condition fails to improve as anticipated.   [place] MD OP Progress Note  10/27/2018 5:42 PM CHRYSTIAN CUPPLES  MRN:  [id number]  Chief Complaint:  Chief Complaint    Follow-up     HPI: Aaron [name] is a 53 year old Caucasian male, currently on disability, lives in [place], has a history of MDD, GAD, insomnia, morbid obesity, chronic pain, essential hypertension was evaluated by telemedicine today.  Patient today reports he is currently making progress with regards to his depressive symptoms.  He does have some sadness however has been coping better than before.  He reports he is tolerating the Lexapro at this dosage well.  He denies any side effects.  Patient is mildly anxious about the COVID-19 outbreak.  He is also anxious about his mother being in hospice as well as his wife having health problems.  Patient reports his wife went on long-term disability recently and hence does not have health insurance plan.  He has hired a Chief Executive Officer and is currently working with them to get her help.  Patient hence is worried about all the situational stresses.  He continues to be in therapy with Ms. Alden Hipp.  Patient reports he also has access to hospice which provides therapy sessions.  He has  upcoming appointment with Ms. Cecilie Lowers soon.  Patient reports sleep as a problem at this time.  He struggles with pain and is on pain medications.  He reports that he has tried trazodone previously and tolerated it okay.  He would like to try it again.  Patient denies any suicidality, homicidality or perceptual disturbances. Visit Diagnosis:    ICD-10-CM   1. MDD (major depressive disorder), single episode, moderate (HCC) F32.1 escitalopram (LEXAPRO) 10 MG tablet  2. GAD (generalized anxiety disorder) F41.1   3. Insomnia due to mental condition F51.05 traZODone (DESYREL) 50 MG tablet    Past Psychiatric History: I have reviewed past psychiatric history from my progress note on 09/02/2018.  Past trials of Zoloft  Past Medical History:  Past Medical History:  Diagnosis Date  . Hypertension   . Kidney stone     Past Surgical History:  Procedure Laterality Date  . VASECTOMY      Family Psychiatric History: Reviewed family psychiatric history from my progress note on 09/02/2018  Family History:  Family History  Problem Relation Age of Onset  . COPD Mother   . Hypertension Father   . Congestive Heart Failure Father   . COPD Father   . Lung cancer Father   . Hypertension Sister   . Diabetes Paternal Grandfather   . Healthy Son   . Healthy Daughter     Social History: Reviewed social history from my progress note on 09/02/2018 Social History   Socioeconomic History  . Marital  status: Married    Spouse name: carisa  . Number of children: 2  . Years of education: Not on file  . Highest education level: High school graduate  Occupational History  . Not on file  Social Needs  . Financial resource strain: Not hard at all  . Food insecurity:    Worry: Never true    Inability: Never true  . Transportation needs:    Medical: No    Non-medical: No  Tobacco Use  . Smoking status: Never Smoker  . Smokeless tobacco: Never Used  Substance and Sexual Activity  . Alcohol use: Never     Frequency: Never  . Drug use: Never  . Sexual activity: Not Currently  Lifestyle  . Physical activity:    Days per week: 2 days    Minutes per session: 30 min  . Stress: Very much  Relationships  . Social connections:    Talks on phone: Not on file    Gets together: Not on file    Attends religious service: Never    Active member of club or organization: No    Attends meetings of clubs or organizations: Never    Relationship status: Married  Other Topics Concern  . Not on file  Social History Narrative  . Not on file    Allergies: No Known Allergies  Metabolic Disorder Labs: Lab Results  Component Value Date   HGBA1C 6.1 (H) 05/20/2018   No results found for: PROLACTIN No results found for: CHOL, TRIG, HDL, CHOLHDL, VLDL, LDLCALC No results found for: TSH  Therapeutic Level Labs: No results found for: LITHIUM No results found for: VALPROATE No components found for:  CBMZ  Current Medications: Current Outpatient Medications  Medication Sig Dispense Refill  . busPIRone (BUSPAR) 10 MG tablet Take 1 tablet (10 mg total) by mouth 2 (two) times daily. 180 tablet 0  . cyclobenzaprine (FLEXERIL) 10 MG tablet Take 10 mg by mouth at bedtime.    Marland Kitchen escitalopram (LEXAPRO) 10 MG tablet Take 1 tablet (10 mg total) by mouth daily. 90 tablet 0  . gabapentin (NEURONTIN) 300 MG capsule Take 300 mg by mouth 3 (three) times daily. 1 capsule in the morning and 2 capsules at night    . HYDROcodone-acetaminophen (NORCO) 10-325 MG tablet Take by mouth.    . losartan (COZAAR) 100 MG tablet Take 1 tablet (100 mg total) by mouth daily. 90 tablet 0  . meloxicam (MOBIC) 15 MG tablet Take 1/2 tablet once daily SPARINGLY as needed for pain. 30 tablet 0  . potassium chloride (K-DUR) 10 MEQ tablet Take 1 tablet (10 mEq total) by mouth 2 (two) times daily. (Patient not taking: Reported on 07/22/2018) 180 tablet 0  . traZODone (DESYREL) 50 MG tablet Take 1 tablet (50 mg total) by mouth at bedtime. 90  tablet 0  . triamcinolone cream (KENALOG) 0.1 % Apply 1 application topically 2 (two) times daily. (Patient not taking: Reported on 10/19/2018) 30 g 0   No current facility-administered medications for this visit.      Musculoskeletal: Strength & Muscle Tone: within normal limits Gait & Station: normal Patient leans: N/A  Psychiatric Specialty Exam: Review of Systems  Psychiatric/Behavioral: The patient is nervous/anxious and has insomnia.   All other systems reviewed and are negative.   There were no vitals taken for this visit.There is no height or weight on file to calculate BMI.  General Appearance: Casual  Eye Contact:  Fair  Speech:  Normal Rate  Volume:  Normal  Mood:  Anxious  Affect:  Congruent  Thought Process:  Goal Directed and Descriptions of Associations: Intact  Orientation:  Full (Time, Place, and Person)  Thought Content: Logical   Suicidal Thoughts:  No  Homicidal Thoughts:  No  Memory:  Immediate;   Fair Recent;   Fair Remote;   Fair  Judgement:  Fair  Insight:  Fair  Psychomotor Activity:  Normal  Concentration:  Concentration: Fair and Attention Span: Fair  Recall:  [employer] of Knowledge: Fair  Language: Fair  Akathisia:  No  Handed:  Right  AIMS (if indicated): Denies tremors, rigidity, stiffness  Assets:  Communication Skills Desire for Improvement Social Support  ADL's:  Intact  Cognition: WNL  Sleep:  restless   Screenings: PHQ2-9     Office Visit from 05/20/2018 in [facility] Visit from 10/22/2017 in [facility]  PHQ-2 Total Score  0  0  PHQ-9 Total Score  -  6       Assessment and Plan: Odas is a 53 year old Caucasian male who is married, on disability, lives in [place], has a history of depression, anxiety, morbid obesity, chronic pain, hypertension was evaluated by telemedicine today.  Patient is biologically predisposed given his history of medical problems, history of mental health problems in  his family.  He also has psychosocial stressors of his wife and mother being diagnosed with cancer, his own health problems as well as current pandemic that is going on.  Patient will continue to benefit from medication readjustment since he continues to struggle with sleep.  He otherwise is making progress.  Plan as noted below.  Plan MDD-improving Lexapro 10 mg p.o. daily BuSpar 10 mg p.o. twice daily  For GAD-improving Lexapro and BuSpar as prescribed Continue CBT  For insomnia- restless Start trazodone 50 mg p.o. nightly He will also work on his sleep hygiene  Continue to work with his therapist Ms. Alden Hipp he has upcoming appointment. Patient also has access to hospice for therapy sessions.  Follow-up in clinic in 3 to 4 weeks or sooner if needed.  Appointment scheduled for June 18 at 3:15 PM  I have spent atleast 15 minutes non face to face with patient today. More than 50 % of the time was spent for psychoeducation and supportive psychotherapy and care coordination.  This note was generated in part or whole with voice recognition software. Voice recognition is usually quite accurate but there are transcription errors that can and very often do occur. I apologize for any typographical errors that were not detected and corrected.        Ursula Alert, MD 10/27/2018, 5:42 PM

## 2018-10-28 NOTE — Telephone Encounter (Signed)
Sent Lexapro

## 2018-10-29 ENCOUNTER — Encounter: Payer: Self-pay | Admitting: Surgery

## 2018-10-29 ENCOUNTER — Ambulatory Visit: Payer: Self-pay | Admitting: Surgery

## 2018-10-29 ENCOUNTER — Ambulatory Visit (INDEPENDENT_AMBULATORY_CARE_PROVIDER_SITE_OTHER): Payer: 59 | Admitting: Surgery

## 2018-10-29 ENCOUNTER — Other Ambulatory Visit: Payer: Self-pay

## 2018-10-29 VITALS — BP 145/106 | HR 82 | Temp 97.5°F | Ht 68.0 in | Wt 299.0 lb

## 2018-10-29 DIAGNOSIS — G8929 Other chronic pain: Secondary | ICD-10-CM

## 2018-10-29 DIAGNOSIS — R1031 Right lower quadrant pain: Secondary | ICD-10-CM

## 2018-10-29 NOTE — Progress Notes (Addendum)
Surgical Clinic History and Physical  Referring provider:  Trinna Post, PA-C [address], [place] [address]  HISTORY OF PRESENT ILLNESS (HPI):  53 y.o. male presents for evaluation of Right lower quadrant abdominal pain with radiation to his Right > Left lower back. Patient reports he's noticed a Right lower quadrant abdominal wall "bulge" x 4-5 years, but describes his RLQ abdominal pain increased significantly after he recently fell. Patient denies any groin pain or bulge bilaterally and denies constipation, frequent coughing, straining with urination, or heavy lifting. Rather, he says he continues to take a stool softener and laxative despite frequent loose BM's due to concern that his loose BM's may be passing passing through intestine obstructed by his hernias. He had a brief episode of N/V recently, which self-resolved. He otherwise denies any fever/chills, abdominal distention, CP, or SOB. Of note, patient has only 2 months left of a 1 year period of disability leave from his employer attributed to ongoing narcotic pain medication for his chronic lower back pain, though patient states his pain medicine physician would not "approve" him for spine surgery, for which he was recently assessed with an orthopedic surgeon, until he loses weight. Patient says he was also previously evaluated by a bariatric surgeon at [facility], but did not follow up due to his wife and parent's illnesses.  PAST MEDICAL HISTORY (PMH):  Past Medical History:  Diagnosis Date  . Chronic back pain    disc  . Hypertension   . Kidney stone   . MDD (major depressive disorder)     PAST SURGICAL HISTORY ([place]):  Past Surgical History:  Procedure Laterality Date  . KIDNEY STONE SURGERY    . VASECTOMY      MEDICATIONS:  Prior to Admission medications   Medication Sig Start Date End Date Taking? Authorizing Provider  busPIRone (BUSPAR) 10 MG tablet Take 1 tablet (10 mg total) by mouth 2 (two)  times daily. 09/02/18  Yes Ursula Alert, MD  cyclobenzaprine (FLEXERIL) 10 MG tablet Take 10 mg by mouth at bedtime.   Yes [provider]  docusate sodium (COLACE) 100 MG capsule Take 100 mg by mouth 2 (two) times daily.   Yes [provider]  escitalopram (LEXAPRO) 10 MG tablet TAKE 1 TABLET BY MOUTH EVERY DAY 10/28/18  Yes Eappen, Ria Clock, MD  escitalopram (LEXAPRO) 10 MG tablet Take 1 tablet (10 mg total) by mouth daily. 10/27/18  Yes Ursula Alert, MD  gabapentin (NEURONTIN) 300 MG capsule Take 300 mg by mouth 3 (three) times daily. 1 capsule in the morning and 2 capsules at night   Yes [provider]  HYDROcodone-acetaminophen (NORCO) 10-325 MG tablet Take 1 tablet by mouth every 12 (twelve) hours as needed.  06/08/18  Yes [provider]  losartan (COZAAR) 100 MG tablet Take 1 tablet (100 mg total) by mouth daily. 09/08/18  Yes Trinna Post, PA-C  meloxicam (MOBIC) 15 MG tablet Take 1/2 tablet once daily SPARINGLY as needed for pain. 09/01/18  Yes Trinna Post, PA-C  traZODone (DESYREL) 50 MG tablet Take 1 tablet (50 mg total) by mouth at bedtime. 10/27/18  Yes Ursula Alert, MD    ALLERGIES:  No Known Allergies   SOCIAL HISTORY:  Social History   Socioeconomic History  . Marital status: Married    Spouse name: carisa  . Number of children: 2  . Years of education: Not on file  . Highest education level: High school graduate  Occupational History  .  Not on file  Social Needs  . Financial resource strain: Not hard at all  . Food insecurity:    Worry: Never true    Inability: Never true  . Transportation needs:    Medical: No    Non-medical: No  Tobacco Use  . Smoking status: Never Smoker  . Smokeless tobacco: Never Used  Substance and Sexual Activity  . Alcohol use: Never    Frequency: Never  . Drug use: Never  . Sexual activity: Not Currently  Lifestyle  . Physical activity:    Days per week: 2 days    Minutes per session:  30 min  . Stress: Very much  Relationships  . Social connections:    Talks on phone: Not on file    Gets together: Not on file    Attends religious service: Never    Active member of club or organization: No    Attends meetings of clubs or organizations: Never    Relationship status: Married  . Intimate partner violence:    Fear of current or ex partner: No    Emotionally abused: No    Physically abused: No    Forced sexual activity: No  Other Topics Concern  . Not on file  Social History Narrative  . Not on file    The patient currently resides (home / rehab facility / nursing home): Home The patient normally is (ambulatory / bedbound): Ambulatory  FAMILY HISTORY:  Family History  Problem Relation Age of Onset  . COPD Mother   . Hypertension Father   . Congestive Heart Failure Father   . COPD Father   . Lung cancer Father   . Hypertension Sister   . Diabetes Paternal Grandfather   . Healthy Son   . Healthy Daughter     Otherwise negative/non-contributory.  REVIEW OF SYSTEMS:  Constitutional: denies any other weight loss, fever, chills, or sweats  Eyes: denies any other vision changes, history of eye injury  ENT: denies sore throat, hearing problems  Respiratory: denies shortness of breath, wheezing  Cardiovascular: denies chest pain, palpitations  Gastrointestinal: abdominal pain, N/V, and bowel function as per HPI Musculoskeletal: denies any other joint pains or cramps  Skin: Denies any other rashes or skin discolorations Neurological: denies any other headache, dizziness, weakness  Psychiatric: Denies any other depression, anxiety   All other review of systems were otherwise negative   VITAL SIGNS:  BP (!) 145/106   Pulse 82   Temp (!) 97.5 F (36.4 C) (Temporal)   Ht 5\' 8"  (1.727 m)   Wt 299 lb (135.6 kg)   SpO2 97%   BMI 45.46 kg/m   PHYSICAL EXAM:  Constitutional:  -- Obese body habitus  -- Awake, alert, and oriented x3  Eyes:  -- Pupils  equally round and reactive to light  -- No scleral icterus  Ear, nose, throat:  -- No jugular venous distension -- No nasal drainage, bleeding Pulmonary:  -- No crackles  -- Equal breath sounds bilaterally -- Breathing non-labored at rest Cardiovascular:  -- S1, S2 present  -- No pericardial rubs  Gastrointestinal:  -- Abdomen soft and obese, but non-distended, with focal RLQ tenderness to deep palpation (~halfway between umbilicus and groin and not associated with any particular anatomical structure), no guarding/rebound tenderness -- Subtle easily reducible and completely non-tender to palpation Right inguinal hernia -- Unable to appreciate on exam Left inguinal hernia, also without groin tenderness to palpation -- No other abdominal masses appreciated, pulsatile or otherwise despite  repeat exams Musculoskeletal and Integumentary:  -- Wounds or skin discoloration: None appreciated -- Extremities: B/L UE and LE FROM, hands and feet warm  Neurologic:  -- Motor function: Intact and symmetric -- Sensation: Intact and symmetric  Labs:  CBC Latest Ref Rng & Units 10/19/2018 05/20/2018 03/03/2013  WBC 3.4 - 10.8 x10E3/uL 7.2 7.8 13.9(H)  Hemoglobin 13.0 - 17.7 g/dL 15.8 14.9 15.8  Hematocrit 37.5 - 51.0 % 45.4 44.2 45.2  Platelets 150 - 450 x10E3/uL 382 380 349   CMP Latest Ref Rng & Units 10/19/2018 05/20/2018 03/03/2013  Glucose 65 - 99 mg/dL 93 110(H) 118(H)  BUN 6 - 24 mg/dL 17 13 13   Creatinine 0.76 - 1.27 mg/dL 0.98 1.09 1.41(H)  Sodium 134 - 144 mmol/L 139 141 137  Potassium 3.5 - 5.2 mmol/L 4.0 3.2(L) 3.3(L)  Chloride 96 - 106 mmol/L 103 100 105  CO2 20 - 29 mmol/L 23 26 28   Calcium 8.7 - 10.2 mg/dL 9.4 9.4 9.3  Total Protein 6.0 - 8.5 g/dL 7.6 6.8 7.8  Total Bilirubin 0.0 - 1.2 mg/dL 0.4 0.3 0.4  Alkaline Phos 39 - 117 IU/L 122(H) 80 128  AST 0 - 40 IU/L 24 23 28   ALT 0 - 44 IU/L 27 26 34   Imaging studies:  CT Abdomen and Pelvis with Contrast (10/23/2018) - personally  reviewed with patient and his wife 1.  Fat containing inguinal hernias bilaterally, right greater than left. 2.  Normal appendix. 3.  No evidence of urinary tract calculus or hydronephrosis.  Stomach is within normal limits. Appendix appears normal. No evidence of bowel wall thickening, distention, or inflammatory changes. Sigmoid diverticulosis.  Assessment/Plan:  53 y.o. male with essentially asymptomatic Right > Left fat-containing inguinal hernias without any evidence to suggest obstruction and focal Right lower quadrant abdominal pain of unclear etiology, possibly post-traumatic/fall, though wouldn't explain perceived "bulge", complicated by co-morbidities including morbid obestity (BMI 46), HTN, chronic narcotics for lower back pain, nephrolithiasis, generalized anxiety disorder, and major depression disorder.    - abdominal CT images personally reviewed with patient and his wife             - discussed with patient signs and symptoms of inguinal hernias, including incarceration and obstruction             - strategies for manual self-reduction of patient's inguinal hernias were also reviewed and discussed  - patient encouraged to maintain hydration with high fiber heart healthy diet to reduce/minimize constipation +/- daily stool softener as needed (particularly with chronic narcotic pain medications), though reduce stool softener and stop laxatives if frequent loose BM's             - patient's symptoms do not seem to be consistent with those of inguinal (or even less common abdominal wall) hernias and accordingly, unfortunately, do not appear likely to be relieved by hernia repair  - return to clinic as needed, instructed to call if any questions or concerns  - consider bariatric surgery follow-up  All of the above recommendations were discussed with the patient and patient's wife, and all of patient's and family's questions were answered to their expressed satisfaction.  Thank you for  the opportunity to participate in this patient's care.  -- Marilynne Drivers Rosana Hoes, MD, [place]: [facility] - Partnering for exceptional care. Office: [phone]

## 2018-10-29 NOTE — Patient Instructions (Addendum)
The patient is aware to call back for any questions or new concerns.  Recommend avoiding stool softeners when you are having diarrhea episodes.   Inguinal Hernia, Adult An inguinal hernia is when fat or your intestines push through a weak spot in a muscle where your leg meets your lower belly (groin). This causes a rounded lump (bulge). This kind of hernia could also be:  In your scrotum, if you are male.  In folds of skin around your vagina, if you are male. There are three types of inguinal hernias. These include:  Hernias that can be pushed back into the belly (are reducible). This type rarely causes pain.  Hernias that cannot be pushed back into the belly (are incarcerated).  Hernias that cannot be pushed back into the belly and lose their blood supply (are strangulated). This type needs emergency surgery. If you do not have symptoms, you may not need treatment. If you have symptoms or a large hernia, you may need surgery. Follow these instructions at home: Lifestyle  Do these things if told by your doctor so you do not have trouble pooping (constipation): ? Drink enough fluid to keep your pee (urine) pale yellow. ? Eat foods that have a lot of fiber. These include fresh fruits and vegetables, whole grains, and beans. ? Limit foods that are high in fat and processed sugars. These include foods that are fried or sweet. ? Take medicine for trouble pooping.  Avoid lifting heavy objects.  Avoid standing for long amounts of time.  Do not use any products that contain nicotine or tobacco. These include cigarettes and e-cigarettes. If you need help quitting, ask your doctor.  Stay at a healthy weight. General instructions  You may try to push your hernia in by very gently pressing on it when you are lying down. Do not try to force the bulge back in if it will not push in easily.  Watch your hernia for any changes in shape, size, or color. Tell your doctor if you see any  changes.  Take over-the-counter and prescription medicines only as told by your doctor.  Keep all follow-up visits as told by your doctor. This is important. Contact a doctor if:  You have a fever.  You have new symptoms.  Your symptoms get worse. Get help right away if:  The area where your leg meets your lower belly has: ? Pain that gets worse suddenly. ? A bulge that gets bigger suddenly, and it does not get smaller after that. ? A bulge that turns red or purple. ? A bulge that is painful when you touch it.  You are a man, and your scrotum: ? Suddenly feels painful. ? Suddenly changes in size.  You cannot push the hernia in by very gently pressing on it when you are lying down. Do not try to force the bulge back in if it will not push in easily.  You feel sick to your stomach (nauseous), and that feeling does not go away.  You throw up (vomit), and that keeps happening.  You have a fast heartbeat.  You cannot poop (have a bowel movement) or pass gas. These symptoms may be an emergency. Do not wait to see if the symptoms will go away. Get medical help right away. Call your local emergency services (911 in the [place] Summary  An inguinal hernia is when fat or your intestines push through a weak spot in a muscle where your leg meets your lower belly (groin). This  causes a rounded lump (bulge).  If you do not have symptoms, you may not need treatment. If you have symptoms or a large hernia, you may need surgery.  Avoid lifting heavy objects. Also avoid standing for long amounts of time.  Do not try to force the bulge back in if it will not push in easily. This information is not intended to replace advice given to you by your health care provider. Make sure you discuss any questions you have with your health care provider. Document Released: 06/20/2006 Document Revised: 06/21/2017 Document Reviewed: 02/19/2017 Elsevier Interactive Patient Education  2019 [employer].

## 2018-10-29 NOTE — Telephone Encounter (Signed)
L.O.V. 10/19/2018, please advise.

## 2018-11-02 ENCOUNTER — Telehealth: Payer: Self-pay | Admitting: Physician Assistant

## 2018-11-02 DIAGNOSIS — M25551 Pain in right hip: Secondary | ICD-10-CM

## 2018-11-02 NOTE — Telephone Encounter (Signed)
Pt would like a referral to [facility] for pain in his rt hip area  CB#  [phone]  teri

## 2018-11-03 NOTE — Telephone Encounter (Signed)
Referral placed.

## 2018-11-04 ENCOUNTER — Other Ambulatory Visit: Payer: Self-pay

## 2018-11-04 ENCOUNTER — Ambulatory Visit (INDEPENDENT_AMBULATORY_CARE_PROVIDER_SITE_OTHER): Payer: 59 | Admitting: Licensed Clinical Social Worker

## 2018-11-04 ENCOUNTER — Encounter: Payer: Self-pay | Admitting: Licensed Clinical Social Worker

## 2018-11-04 DIAGNOSIS — F321 Major depressive disorder, single episode, moderate: Secondary | ICD-10-CM | POA: Diagnosis not present

## 2018-11-04 NOTE — Progress Notes (Signed)
Virtual Visit via Telephone Note  I connected with [name] on 11/04/18 at 11:00 AM EDT by telephone and verified that I am speaking with the correct person using two identifiers.   I discussed the limitations, risks, security and privacy concerns of performing an evaluation and management service by telephone and the availability of in person appointments. I also discussed with the patient that there may be a patient responsible charge related to this service. The patient expressed understanding and agreed to proceed.  I discussed the assessment and treatment plan with the patient. The patient was provided an opportunity to ask questions and all were answered. The patient agreed with the plan and demonstrated an understanding of the instructions.   The patient was advised to call back or seek an in-person evaluation if the symptoms worsen or if the condition fails to improve as anticipated.  I provided 20 minutes of non-face-to-face time during this encounter.   Alden Hipp, LCSW    THERAPIST PROGRESS NOTE  Session Time: 1100  Participation Level: Minimal  Behavioral Response: NAAlertAngry  Type of Therapy: Individual Therapy  Treatment Goals addressed: Coping  Interventions: Supportive  Summary: SCOTTI KOSTA is a 53 y.o. male who presents with continued symptoms related to his diagnosis. Daegen reports doing, "okay," since our last session. He reports his mother was admitted to hospice care two weeks ago and he has had a difficult time dealing with that. LCSW asked if Mohab met with the counselor at [facility] to discuss his feelings around the situation. Younes reported he did not, and was unable to provide a reason he did not take that advice. LCSW encouraged Rufus to revisit the idea of meeting with the hospice social worker. Cheron expressed indifference about the idea by stating, "I don't know." Christ went on to state his wife is beginning chemo again, which he felt  nervous about. LCSW encouraged Kenniel to challenge the negative thoughts and replace them with more positive ones--instead of she has to get chemo, replace it with, she is getting chemo to help treat her. Nicolaas expressed agreement. Juniel discussed feeling angry with the  MD in the clinic due to her not writing him out of work for the month of June. LCSW encouraged Zacharius to file for [employer]. Allante was opposed to this idea and stated, "You don't get paid for that. 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 Takahiro was irritable in answering all questions and answered all of them with "no." LCSW encouraged Develle to attempt reframing his thoughts instead of: no one will help me, think, "I have to figure out another way to help myself." Walid expressed indifference with this idea as well.   Suicidal/Homicidal: No Therapist Response: Kenrick continues to work towards his tx goals but has not yet reached them. He reports not practicing things we discuss in sessions, and reports frustration with the lack of progress. LCSW has continue encouraging Hansel to practice CBT skills learned in sessions.  Plan: Return again in 2 weeks.  Diagnosis: Axis I: MDD    Axis II: No diagnosis    Alden Hipp, LCSW 11/04/2018

## 2018-11-19 ENCOUNTER — Other Ambulatory Visit: Payer: Self-pay

## 2018-11-19 ENCOUNTER — Encounter: Payer: Self-pay | Admitting: Psychiatry

## 2018-11-19 ENCOUNTER — Ambulatory Visit: Payer: 59 | Admitting: Physician Assistant

## 2018-11-19 ENCOUNTER — Telehealth (HOSPITAL_COMMUNITY): Payer: Self-pay | Admitting: Psychiatry

## 2018-11-19 ENCOUNTER — Ambulatory Visit (INDEPENDENT_AMBULATORY_CARE_PROVIDER_SITE_OTHER): Payer: 59 | Admitting: Psychiatry

## 2018-11-19 DIAGNOSIS — Z634 Disappearance and death of family member: Secondary | ICD-10-CM | POA: Diagnosis not present

## 2018-11-19 DIAGNOSIS — F5105 Insomnia due to other mental disorder: Secondary | ICD-10-CM | POA: Diagnosis not present

## 2018-11-19 DIAGNOSIS — F411 Generalized anxiety disorder: Secondary | ICD-10-CM | POA: Diagnosis not present

## 2018-11-19 DIAGNOSIS — F321 Major depressive disorder, single episode, moderate: Secondary | ICD-10-CM | POA: Insufficient documentation

## 2018-11-19 NOTE — Progress Notes (Signed)
Virtual Visit via Video Note  I connected with Aaron [name] on 11/19/18 at  3:15 PM EDT by a video enabled telemedicine application and verified that I am speaking with the correct person using two identifiers.   I discussed the limitations of evaluation and management by telemedicine and the availability of in person appointments. The patient expressed understanding and agreed to proceed.     I discussed the assessment and treatment plan with the patient. The patient was provided an opportunity to ask questions and all were answered. The patient agreed with the plan and demonstrated an understanding of the instructions.   The patient was advised to call back or seek an in-person evaluation if the symptoms worsen or if the condition fails to improve as anticipated.   [place] MD OP Progress Note  11/19/2018 5:52 PM Aaron [name]  MRN:  [id number]  Chief Complaint:  Chief Complaint    Follow-up     HPI: [name] is a 53 year old Caucasian male, currently employed, lives in [place], has a history of MDD, GAD, insomnia, morbid obesity, chronic pain, essential hypertension, bereavement was evaluated by telemedicine today.  Patient reported that his mother recently passed away.  Patient appeared to be depressed about the same.  Patient also reported that his wife is currently at the hospital.  He reports she is currently admitted with a fever.  Patient is not very happy about the way hospital is handling his wife's care.  Patient did not elaborate on it.  Patient reports he is tolerating the Lexapro well.  He reports the Lexapro was helpful for his mood.  He reports it has made a difference in his depressive symptoms however due to the current situational stressors he currently feels sad.  He is also worried about his wife's health.  Patient however reports he wants to be there with his wife to help her out since she is struggling with health problems.  Discussed with patient that since he is  making  progress on the Lexapro writer is unable to take him out of work at this time.  However if he is struggling with grief or if he feels like he needs more intensive psychotherapy sessions he can be referred so that he has more support system.  Also discussed with patient he could also see his therapist here Ms. Alden Hipp more frequently.  Discussed with patient if he is not willing to do that he could also try grief counseling with hospice.  When patient was told that writer will not take him out of work patient became very upset.  Patient did not want to discuss anything further however agreed that he is willing to talk to the intensive outpatient program personnel if he decides to do that at some point.  Patient currently denies any suicidality, homicidality or perceptual disturbances.  He reports sleep is good on the trazodone and he declines any medication readjustment today. Visit Diagnosis:    ICD-10-CM   1. MDD (major depressive disorder), single episode, moderate (HCC)  F32.1   2. Bereavement  Z63.4   3. GAD (generalized anxiety disorder)  F41.1   4. Insomnia due to mental condition  F51.05     Past Psychiatric History: Reviewed past psychiatric history from my progress note on 09/02/2018.  Past trials of Zoloft.  Past Medical History:  Past Medical History:  Diagnosis Date  . Chronic back pain    disc  . Hypertension   . Kidney stone   .  MDD (major depressive disorder)     Past Surgical History:  Procedure Laterality Date  . KIDNEY STONE SURGERY    . VASECTOMY      Family Psychiatric History: Reviewed family psychiatric history from my progress note on 09/02/2018.  Family History:  Family History  Problem Relation Age of Onset  . COPD Mother   . Hypertension Father   . Congestive Heart Failure Father   . COPD Father   . Lung cancer Father   . Hypertension Sister   . Diabetes Paternal Grandfather   . Healthy Son   . Healthy Daughter     Social History:  Reviewed social history from my progress note on 09/02/2018. Social History   Socioeconomic History  . Marital status: Married    Spouse name: carisa  . Number of children: 2  . Years of education: Not on file  . Highest education level: High school graduate  Occupational History  . Not on file  Social Needs  . Financial resource strain: Not hard at all  . Food insecurity    Worry: Never true    Inability: Never true  . Transportation needs    Medical: No    Non-medical: No  Tobacco Use  . Smoking status: Never Smoker  . Smokeless tobacco: Never Used  Substance and Sexual Activity  . Alcohol use: Never    Frequency: Never  . Drug use: Never  . Sexual activity: Not Currently  Lifestyle  . Physical activity    Days per week: 2 days    Minutes per session: 30 min  . Stress: Very much  Relationships  . Social Herbalist on phone: Not on file    Gets together: Not on file    Attends religious service: Never    Active member of club or organization: No    Attends meetings of clubs or organizations: Never    Relationship status: Married  Other Topics Concern  . Not on file  Social History Narrative  . Not on file    Allergies: No Known Allergies  Metabolic Disorder Labs: Lab Results  Component Value Date   HGBA1C 6.1 (H) 05/20/2018   No results found for: PROLACTIN No results found for: CHOL, TRIG, HDL, CHOLHDL, VLDL, LDLCALC No results found for: TSH  Therapeutic Level Labs: No results found for: LITHIUM No results found for: VALPROATE No components found for:  CBMZ  Current Medications: Current Outpatient Medications  Medication Sig Dispense Refill  . busPIRone (BUSPAR) 10 MG tablet Take 1 tablet (10 mg total) by mouth 2 (two) times daily. 180 tablet 0  . cyclobenzaprine (FLEXERIL) 10 MG tablet Take 10 mg by mouth at bedtime.    . docusate sodium (COLACE) 100 MG capsule Take 100 mg by mouth 2 (two) times daily.    Marland Kitchen escitalopram (LEXAPRO) 10 MG  tablet TAKE 1 TABLET BY MOUTH EVERY DAY 90 tablet 1  . gabapentin (NEURONTIN) 300 MG capsule Take 300 mg by mouth 3 (three) times daily. 1 capsule in the morning and 2 capsules at night    . HYDROcodone-acetaminophen (NORCO) 10-325 MG tablet Take 1 tablet by mouth every 12 (twelve) hours as needed.     Marland Kitchen losartan (COZAAR) 100 MG tablet Take 1 tablet (100 mg total) by mouth daily. 90 tablet 0  . meloxicam (MOBIC) 15 MG tablet TAKE 1/2 TABLET ONCE DAILY SPARINGLY AS NEEDED FOR PAIN. 30 tablet 0  . traZODone (DESYREL) 50 MG tablet Take 1 tablet (50  mg total) by mouth at bedtime. 90 tablet 0   No current facility-administered medications for this visit.      Musculoskeletal: Strength & Muscle Tone: within normal limits Gait & Station: normal Patient leans: N/A  Psychiatric Specialty Exam: Review of Systems  Psychiatric/Behavioral: The patient is nervous/anxious.   All other systems reviewed and are negative.   There were no vitals taken for this visit.There is no height or weight on file to calculate BMI.  General Appearance: Casual  Eye Contact:  Fair  Speech:  Clear and Coherent  Volume:  Normal  Mood:  Angry and Anxious, sad about his mother's death.  Affect:  Congruent  Thought Process:  Goal Directed and Descriptions of Associations: Intact  Orientation:  Full (Time, Place, and Person)  Thought Content: Logical   Suicidal Thoughts:  No  Homicidal Thoughts:  No  Memory:  Immediate;   Fair Recent;   Fair Remote;   Fair  Judgement:  Fair  Insight:  Fair  Psychomotor Activity:  Normal  Concentration:  Concentration: Fair and Attention Span: Fair  Recall:  [employer] of Knowledge: Fair  Language: Fair  Akathisia:  No  Handed:  Right  AIMS (if indicated): denies tremors, rigidity  Assets:  Communication Skills Desire for Improvement Social Support  ADL's:  Intact  Cognition: WNL  Sleep:  Fair   Screenings: PHQ2-9     Office Visit from 05/20/2018 in [facility] Visit from 10/22/2017 in [facility]  PHQ-2 Total Score  0  0  PHQ-9 Total Score  -  6       Assessment and Plan: Yandiel is a 53 year old  male, married, on disability, lives in [place], has a history of depression, anxiety, morbid obesity, chronic pain, hypertension was evaluated by telemedicine today.  Patient currently is grieving the loss of his mother who passed away recently and also has the psychosocial stressor of his wife who is struggling with cancer and is currently admitted at the hospital.  Patient currently declines any more medication changes however it was discussed with patient that since he is struggling with grief he would benefit from more frequent therapy sessions.  Patient reports he is willing to discuss this with the intensive outpatient program personnel.  Will definitely refer him so that he can get more information if he decides to do that.  Otherwise discussed with him to make use of grief counseling with hospice or more frequent therapy sessions with his therapist.  Patient agrees with plan.  Plan MDD- some improvement Lexapro 10 mg p.o. daily-patient declined medication readjustment today BuSpar 10 mg p.o. twice daily.  Patient declined medication readjustment today.  GAD- some improvement Lexapro and BuSpar as prescribed Continue CBT  For insomnia-stable Trazodone 50 mg p.o. nightly  I have referred him to the intensive outpatient program if he is struggling with grief from the loss of his mother and if he is unable to see the therapist here in clinic more frequently, discussed with him to make use of it if he is unable to cope.  Also discussed grief counseling with hospice.  He does have an upcoming appointment with therapist here in clinic on 22 June.  Advised him to make use of it.  Follow-up in clinic in 2 to 3 weeks or sooner if needed.  Patient however reports he does not want to schedule an appointment with writer at this time and  he will call back to schedule one.  I have spent atleast 15 minutes non face to face with patient today. More than 50 % of the time was spent for psychoeducation and supportive psychotherapy and care coordination.  This note was generated in part or whole with voice recognition software. Voice recognition is usually quite accurate but there are transcription errors that can and very often do occur. I apologize for any typographical errors that were not detected and corrected.        Ursula Alert, MD 11/20/2018, 2:17 PM

## 2018-11-19 NOTE — Telephone Encounter (Signed)
D:  Dr. Shea Evans referred pt to [place].  A:  Placed call to orient pt and provide him with a start date.  Pt kept stating that no one will write him out of work.  Informed pt that [facility] could write him out if he chose to do the program.  Pt states he will think about it and call writer back.  Inform Dr. Shea Evans.

## 2018-11-23 ENCOUNTER — Other Ambulatory Visit: Payer: Self-pay

## 2018-11-23 ENCOUNTER — Encounter: Payer: Self-pay | Admitting: Licensed Clinical Social Worker

## 2018-11-23 ENCOUNTER — Ambulatory Visit (INDEPENDENT_AMBULATORY_CARE_PROVIDER_SITE_OTHER): Payer: 59 | Admitting: Licensed Clinical Social Worker

## 2018-11-23 DIAGNOSIS — F321 Major depressive disorder, single episode, moderate: Secondary | ICD-10-CM | POA: Diagnosis not present

## 2018-11-23 NOTE — Progress Notes (Signed)
Virtual Visit via Telephone Note  I connected with Aaron [name] on 11/23/18 at 12:30 PM EDT by telephone and verified that I am speaking with the correct person using two identifiers.   I discussed the limitations, risks, security and privacy concerns of performing an evaluation and management service by telephone and the availability of in person appointments. I also discussed with the patient that there may be a patient responsible charge related to this service. The patient expressed understanding and agreed to proceed.   I discussed the assessment and treatment plan with the patient. The patient was provided an opportunity to ask questions and all were answered. The patient agreed with the plan and demonstrated an understanding of the instructions.   The patient was advised to call back or seek an in-person evaluation if the symptoms worsen or if the condition fails to improve as anticipated.  I provided 10 minutes of non-face-to-face time during this encounter.   Aaron Hipp, LCSW    THERAPIST PROGRESS NOTE  Session Time: 1230  Participation Level: None  Behavioral Response: NAAlertAngry  Type of Therapy: Individual Therapy  Treatment Goals addressed: Coping  Interventions: Supportive  Summary: Aaron [name] is a 53 y.o. male who presents with continued symptoms related to his diagnosis. LCSW contacted Aaron [name] for his appointment and he did not answer the phone so LCSW left a voicemail. LCSW reattempted contact and Aaron [name] answered. He reported not wanting to talk or to continue services with LCSW or MD. LCSW asked what was going on and Aaron [name] reported, "I told the doctor up there to shove it because she didn't think my depression was bad enough to write me out of work. So, I'm done with y'all." LCSW attempted to [name] and discuss options for treatment. Aaron [name] was not open to hearing this information and told LCSW he did not want to continue therapy before hanging up  the phone.    Suicidal/Homicidal: No  Therapist Response: LCSW will follow up with Aaron [name] as needed and will inform other members of the tx team of new information.   Plan: Aaron [name] was offered a referral for IOP but declined. LCSW encouraged Aaron [name] to follow up with IOP before he hung up the phone.   Diagnosis: Axis I: MDD    Axis II: No diagnosis    Aaron Hipp, LCSW 11/23/2018

## 2018-11-25 ENCOUNTER — Other Ambulatory Visit: Payer: Self-pay | Admitting: Psychiatry

## 2018-11-25 DIAGNOSIS — F419 Anxiety disorder, unspecified: Secondary | ICD-10-CM

## 2018-11-25 DIAGNOSIS — F321 Major depressive disorder, single episode, moderate: Secondary | ICD-10-CM

## 2018-11-26 ENCOUNTER — Telehealth (HOSPITAL_COMMUNITY): Payer: Self-pay | Admitting: Psychiatry

## 2019-01-03 ENCOUNTER — Other Ambulatory Visit: Payer: Self-pay | Admitting: Physician Assistant

## 2019-01-03 DIAGNOSIS — G8929 Other chronic pain: Secondary | ICD-10-CM

## 2019-01-10 ENCOUNTER — Other Ambulatory Visit: Payer: Self-pay | Admitting: Physician Assistant

## 2019-01-10 DIAGNOSIS — I10 Essential (primary) hypertension: Secondary | ICD-10-CM

## 2019-01-18 ENCOUNTER — Other Ambulatory Visit: Payer: Self-pay | Admitting: Psychiatry

## 2019-01-18 DIAGNOSIS — F321 Major depressive disorder, single episode, moderate: Secondary | ICD-10-CM

## 2019-01-19 ENCOUNTER — Other Ambulatory Visit: Payer: Self-pay | Admitting: Psychiatry

## 2019-01-19 DIAGNOSIS — F5105 Insomnia due to other mental disorder: Secondary | ICD-10-CM

## 2019-01-20 NOTE — Telephone Encounter (Signed)
Will not refill medications , patient no longer under our care.

## 2019-02-23 ENCOUNTER — Other Ambulatory Visit: Payer: Self-pay | Admitting: Psychiatry

## 2019-02-23 DIAGNOSIS — F5105 Insomnia due to other mental disorder: Secondary | ICD-10-CM

## 2019-04-08 ENCOUNTER — Other Ambulatory Visit: Payer: Self-pay | Admitting: Physician Assistant

## 2019-04-08 DIAGNOSIS — I10 Essential (primary) hypertension: Secondary | ICD-10-CM

## 2019-04-09 NOTE — Telephone Encounter (Signed)
Patient schedule appointment for 04/15/2019 @ 1:40PM.

## 2019-04-09 NOTE — Telephone Encounter (Signed)
Please schedule follow up. BP uncontrolled and not in since 10/2018.

## 2019-04-09 NOTE — Telephone Encounter (Signed)
L.O.V. was on 10/19/2018 and no upcoming appointment.

## 2019-04-15 ENCOUNTER — Ambulatory Visit: Payer: Self-pay | Admitting: Physician Assistant

## 2019-04-22 ENCOUNTER — Ambulatory Visit: Payer: Self-pay | Admitting: Physician Assistant

## 2019-04-23 ENCOUNTER — Ambulatory Visit: Payer: Self-pay | Admitting: Physician Assistant

## 2019-04-26 ENCOUNTER — Ambulatory Visit (INDEPENDENT_AMBULATORY_CARE_PROVIDER_SITE_OTHER): Payer: 59 | Admitting: Physician Assistant

## 2019-04-26 ENCOUNTER — Encounter: Payer: Self-pay | Admitting: Physician Assistant

## 2019-04-26 DIAGNOSIS — I10 Essential (primary) hypertension: Secondary | ICD-10-CM | POA: Diagnosis not present

## 2019-04-26 DIAGNOSIS — R0981 Nasal congestion: Secondary | ICD-10-CM | POA: Diagnosis not present

## 2019-04-26 DIAGNOSIS — J0110 Acute frontal sinusitis, unspecified: Secondary | ICD-10-CM

## 2019-04-26 MED ORDER — AMOXICILLIN-POT CLAVULANATE 875-125 MG PO TABS: 1.0000 | ORAL_TABLET | Freq: Two times a day (BID) | ORAL | 0 refills | Status: AC

## 2019-04-26 MED ORDER — FLUTICASONE PROPIONATE 50 MCG/ACT NA SUSP: 2.0000 | Freq: Every day | NASAL | 6 refills | Status: DC

## 2019-04-26 NOTE — Progress Notes (Signed)
Patient: Aaron [name] Male    DOB: 10-25-1965   53 y.o.   MRN: [id number] Visit Date: 04/26/2019  Today's Provider: Trinna Post, PA-C   Chief Complaint  Patient presents with  . Hypertension   Subjective:    Virtual Visit via Telephone Note  I connected with [name] on 04/26/19 at  2:40 PM EST by telephone and verified that I am speaking with the correct person using two identifiers.  Location: Patient: Home Provider: Office    HPI   Patient reports he is checking his blood pressure at home and they are120/s70's. Denies chest pain or SOB.   Sinus Infection: Reports 1- 2 month sinus drainage. Reports foul smelling nasal drainage. Denies fevers, chills. Has itchy water eyes. Previously using flonase but ran out.   BP Readings from Last 3 Encounters:  10/29/18 (!) 145/106  10/19/18 (!) 163/114  07/22/18 (!) 151/97   Patient asks about strategies to lose weight. Has been trying to lose weight but finds this difficult due to pulling muscles when he exercises. Wonders if there is medication like steroids to strengthen his muscles.    No Known Allergies   Current Outpatient Medications:  .  ARIPiprazole (ABILIFY) 2 MG tablet, Take 2 mg by mouth daily., Disp: , Rfl:  .  busPIRone (BUSPAR) 10 MG tablet, Take 1 tablet (10 mg total) by mouth 2 (two) times daily., Disp: 180 tablet, Rfl: 0 .  gabapentin (NEURONTIN) 300 MG capsule, Take 300 mg by mouth 3 (three) times daily. 1 capsule in the morning and 2 capsules at night, Disp: , Rfl:  .  HYDROcodone-acetaminophen (NORCO) 10-325 MG tablet, Take 1 tablet by mouth every 12 (twelve) hours as needed. , Disp: , Rfl:  .  losartan (COZAAR) 100 MG tablet, TAKE 1 TABLET BY MOUTH EVERY DAY, Disp: 30 tablet, Rfl: 0 .  meloxicam (MOBIC) 15 MG tablet, TAKE 1/2 TABLET ONCE DAILY SPARINGLY AS NEEDED FOR PAIN., Disp: 30 tablet, Rfl: 0 .  traZODone (DESYREL) 50 MG tablet, Take 1 tablet (50 mg total) by mouth at bedtime.,  Disp: 90 tablet, Rfl: 0 .  venlafaxine XR (EFFEXOR-XR) 150 MG 24 hr capsule, venlafaxine ER 150 mg capsule,extended release 24 hr, Disp: , Rfl:  .  amoxicillin-clavulanate (AUGMENTIN) 875-125 MG tablet, Take 1 tablet by mouth 2 (two) times daily for 7 days., Disp: 14 tablet, Rfl: 0 .  cyclobenzaprine (FLEXERIL) 10 MG tablet, Take 10 mg by mouth at bedtime., Disp: , Rfl:  .  docusate sodium (COLACE) 100 MG capsule, Take 100 mg by mouth 2 (two) times daily., Disp: , Rfl:  .  escitalopram (LEXAPRO) 10 MG tablet, TAKE 1 TABLET BY MOUTH EVERY DAY (Patient not taking: Reported on 04/26/2019), Disp: 90 tablet, Rfl: 1 .  fluticasone (FLONASE) 50 MCG/ACT nasal spray, Place 2 sprays into both nostrils daily., Disp: 16 g, Rfl: 6  Review of Systems  Constitutional: Negative for appetite change, chills and fever.  HENT: Positive for sinus pressure.   Respiratory: Negative for chest tightness, shortness of breath and wheezing.   Cardiovascular: Negative for chest pain and palpitations.  Gastrointestinal: Negative for abdominal pain, nausea and vomiting.    Social History   Tobacco Use  . Smoking status: Never Smoker  . Smokeless tobacco: Never Used  Substance Use Topics  . Alcohol use: Never    Frequency: Never      Objective:   There were no vitals taken for this visit. There  were no vitals filed for this visit.There is no height or weight on file to calculate BMI.   Physical Exam   No results found for any visits on 04/26/19.     Assessment & Plan    1. Essential hypertension   2. Sinus congestion  - fluticasone (FLONASE) 50 MCG/ACT nasal spray; Place 2 sprays into both nostrils daily.  Dispense: 16 g; Refill: 6  3. Acute non-recurrent frontal sinusitis  - amoxicillin-clavulanate (AUGMENTIN) 875-125 MG tablet; Take 1 tablet by mouth 2 (two) times daily for 7 days.  Dispense: 14 tablet; Refill: 0  4. Morbid obesity ([place])  Counseled on safe and effective weight loss such as  reducing portion sizes and doing low impact activities like biking, walking, weight lifting.   I discussed the limitations, risks, security and privacy concerns of performing an evaluation and management service by telephone and the availability of in person appointments. I also discussed with the patient that there may be a patient responsible charge related to this service. The patient expressed understanding and agreed to proceed.  I discussed the assessment and treatment plan with the patient. The patient was provided an opportunity to ask questions and all were answered. The patient agreed with the plan and demonstrated an understanding of the instructions.   The patient was advised to call back or seek an in-person evaluation if the symptoms worsen or if the condition fails to improve as anticipated.  I provided 25 minutes of non-face-to-face time during this encounter.  The entirety of the information documented in the History of Present Illness, Review of Systems and Physical Exam were personally obtained by me. Portions of this information were initially documented by [facility] CMA and reviewed by me for thoroughness and accuracy.         Trinna Post, PA-C  [facility]

## 2019-05-02 ENCOUNTER — Other Ambulatory Visit: Payer: Self-pay | Admitting: Physician Assistant

## 2019-05-02 DIAGNOSIS — I10 Essential (primary) hypertension: Secondary | ICD-10-CM

## 2019-05-04 ENCOUNTER — Ambulatory Visit: Payer: Self-pay | Admitting: Physician Assistant

## 2019-05-26 ENCOUNTER — Telehealth: Payer: 59 | Admitting: Physician Assistant

## 2019-06-04 ENCOUNTER — Other Ambulatory Visit: Payer: Self-pay | Admitting: Physician Assistant

## 2019-06-04 DIAGNOSIS — I10 Essential (primary) hypertension: Secondary | ICD-10-CM

## 2019-06-06 NOTE — Telephone Encounter (Signed)
Requested medication (s) are due for refill today: yes  Requested medication (s) are on the active medication list: yes  Last refill:  05/03/2019  Future visit scheduled: yes  Notes to clinic:  angiotensin receptor blockers failed  Requested Prescriptions  Pending Prescriptions Disp Refills   losartan (COZAAR) 100 MG tablet [Pharmacy Med Name: LOSARTAN POTASSIUM 100 MG TAB] 30 tablet 0    Sig: TAKE 1 TABLET BY MOUTH EVERY DAY      Cardiovascular:  Angiotensin Receptor Blockers Failed - 06/04/2019  1:32 PM      Failed - Cr in normal range and within 180 days    Creatinine  Date Value Ref Range Status  03/03/2013 1.41 (H) 0.60 - 1.30 mg/dL Final   Creatinine, Ser  Date Value Ref Range Status  10/19/2018 0.98 0.76 - 1.27 mg/dL Final          Failed - K in normal range and within 180 days    Potassium  Date Value Ref Range Status  10/19/2018 4.0 3.5 - 5.2 mmol/L Final  03/03/2013 3.3 (L) 3.5 - 5.1 mmol/L Final          Failed - Last BP in normal range    BP Readings from Last 1 Encounters:  10/29/18 (!) 145/106          Failed - Valid encounter within last 6 months    Recent Outpatient Visits           1 month ago Essential hypertension   [place], [place], PA-C   7 months ago Right lower quadrant abdominal pain   [place], [place], [place]   10 months ago Adjustment disorder, unspecified type   [facility] [place], Wendee Beavers, [place]   1 year ago Essential hypertension   [place], [place], [place]   1 year ago Chronic right-sided back pain, unspecified back location   [place], Wendee Beavers, PA-C       Future Appointments             In 4 months Trinna Post, PA-C [employer], [place] - Patient is not pregnant

## 2019-06-07 NOTE — Telephone Encounter (Signed)
L.O.V. was on 04/26/2019 and upcoming appointment on 10/25/2019. Medication send into pharmacy.

## 2019-06-30 ENCOUNTER — Other Ambulatory Visit: Payer: Self-pay | Admitting: Physician Assistant

## 2019-06-30 DIAGNOSIS — I10 Essential (primary) hypertension: Secondary | ICD-10-CM

## 2019-07-27 ENCOUNTER — Other Ambulatory Visit: Payer: Self-pay | Admitting: Physician Assistant

## 2019-07-27 DIAGNOSIS — I10 Essential (primary) hypertension: Secondary | ICD-10-CM

## 2019-10-21 ENCOUNTER — Other Ambulatory Visit: Payer: Self-pay | Admitting: Physician Assistant

## 2019-10-21 DIAGNOSIS — R0981 Nasal congestion: Secondary | ICD-10-CM

## 2019-10-21 DIAGNOSIS — I10 Essential (primary) hypertension: Secondary | ICD-10-CM

## 2019-10-21 NOTE — Telephone Encounter (Signed)
Requested medication (s) are due for refill today: yes  Requested medication (s) are on the active medication list: yes  Last refill: 07/23/2019  Future visit scheduled: yes  Notes to clinic:  Patient has appointment on 10/25/2019 for overdue follow up   Requested Prescriptions  Pending Prescriptions Disp Refills   fluticasone (FLONASE) 50 MCG/ACT nasal spray [Pharmacy Med Name: FLUTICASONE PROP 50 MCG SPRAY] 48 mL 2    Sig: SPRAY 2 SPRAYS INTO EACH NOSTRIL EVERY DAY      Ear, Nose, and Throat: Nasal Preparations - Corticosteroids Failed - 10/21/2019  1:25 AM      Failed - Valid encounter within last 12 months    Recent Outpatient Visits           5 months ago Essential hypertension   [facility] [place], Adriana M, PA-C   1 year ago Right lower quadrant abdominal pain   [facility] [place], Fabio Bering M, [place]   1 year ago Adjustment disorder, unspecified type   [facility] [place], Adriana M, PA-C   1 year ago Essential hypertension   [place], [place], [place]   1 year ago Chronic right-sided back pain, unspecified back location   [facility] [place], Adriana M, PA-C       Future Appointments             In 4 days Trinna Post, PA-C [employer], [facility]              losartan (COZAAR) 100 MG tablet [employer] Med Name: LOSARTAN POTASSIUM 100 MG TAB] 90 tablet 0    Sig: TAKE 1 TABLET BY MOUTH EVERY DAY      Cardiovascular:  Angiotensin Receptor Blockers Failed - 10/21/2019  1:25 AM      Failed - Cr in normal range and within 180 days    Creatinine  Date Value Ref Range Status  03/03/2013 1.41 (H) 0.60 - 1.30 mg/dL Final   Creatinine, Ser  Date Value Ref Range Status  10/19/2018 0.98 0.76 - 1.27 mg/dL Final          Failed - K in normal range and within 180 days    Potassium  Date Value Ref Range Status  10/19/2018 4.0 3.5 - 5.2 mmol/L Final  03/03/2013 3.3 (L) 3.5 -  5.1 mmol/L Final          Failed - Last BP in normal range    BP Readings from Last 1 Encounters:  10/29/18 (!) 145/106          Failed - Valid encounter within last 6 months    Recent Outpatient Visits           5 months ago Essential hypertension   [place], [place], [place]   1 year ago Right lower quadrant abdominal pain   [facility] Carles Collet M, [place]   1 year ago Adjustment disorder, unspecified type   [facility] [place], Wendee Beavers, [place]   1 year ago Essential hypertension   [place], [place], [place]   1 year ago Chronic right-sided back pain, unspecified back location   [facility], Wendee Beavers, PA-C       Future Appointments             In 4 days Trinna Post, PA-C [employer], [place] - Patient  is not pregnant

## 2019-10-21 NOTE — Telephone Encounter (Signed)
30 days losartan given. Please schedule HTN follow up before more refills.

## 2019-10-21 NOTE — Telephone Encounter (Signed)
For Losartan  LOV  Date  04/26/19              LRF  Date        07/27/19    #       87          RF  0  Last labs  Date  10/19/18, patient did not have the labs done that were ordered on 04/26/19

## 2019-10-25 ENCOUNTER — Ambulatory Visit (INDEPENDENT_AMBULATORY_CARE_PROVIDER_SITE_OTHER): Payer: 59 | Admitting: Physician Assistant

## 2019-10-25 DIAGNOSIS — F321 Major depressive disorder, single episode, moderate: Secondary | ICD-10-CM | POA: Diagnosis not present

## 2019-10-25 DIAGNOSIS — R7303 Prediabetes: Secondary | ICD-10-CM

## 2019-10-25 DIAGNOSIS — I10 Essential (primary) hypertension: Secondary | ICD-10-CM | POA: Diagnosis not present

## 2019-10-25 MED ORDER — LOSARTAN POTASSIUM 100 MG PO TABS: 100.0000 mg | ORAL_TABLET | Freq: Every day | ORAL | 0 refills | Status: DC

## 2019-10-25 NOTE — Progress Notes (Signed)
Virtual Video visit    Virtual Visit via Video Note   This visit type was conducted due to national recommendations for restrictions regarding the COVID-19 Pandemic (e.g. social distancing) in an effort to limit this patient's exposure and mitigate transmission in our community. Due to his co-morbid illnesses, this patient is at least at moderate risk for complications without adequate follow up. This format is felt to be most appropriate for this patient at this time. The patient  had technical difficulties with video requiring transitioning to audio format only (telephone). Physical exam was limited to content and character of the telephone converstion.    Patient location: home Provider location: Office   Visit Date: 10/25/2019  Today's healthcare provider: Trinna Post, PA-C   Chief Complaint  Patient presents with  . Hypertension  . Prediabetes   Subjective    HPI Hypertension, follow-up  BP Readings from Last 3 Encounters:  10/29/18 (!) 145/106  10/19/18 (!) 163/114  07/22/18 (!) 151/97   Wt Readings from Last 3 Encounters:  10/29/18 299 lb (135.6 kg)  10/19/18 297 lb 3.2 oz (134.8 kg)  07/22/18 297 lb (134.7 kg)     He was last seen for hypertension 6 months ago.  BP at that visit was not checked due to virtual visit. Management since that visit includes no changes.  He reports excellent compliance with treatment. He is not having side effects.  He is following a Regular diet. He is not exercising. He does not smoke.  Use of agents associated with hypertension: none.   Outside blood pressures are 120s/80's per patient report. Symptoms: No chest pain No chest pressure  No palpitations No syncope  No dyspnea No orthopnea  No paroxysmal nocturnal dyspnea No lower extremity edema   Pertinent labs: No results found for: CHOL, HDL, LDLCALC, LDLDIRECT, TRIG, CHOLHDL Lab Results  Component Value Date   NA 139 10/19/2018   K 4.0 10/19/2018   CREATININE 0.98 10/19/2018   GFRNONAA 88 10/19/2018   GFRAA 102 10/19/2018   GLUCOSE 93 10/19/2018     The ASCVD Risk score Mikey Bussing DC Jr., et al., 2013) failed to calculate for the following reasons:   Cannot find a previous HDL lab   Cannot find a previous total cholesterol lab   Prediabetes, Follow-up  Lab Results  Component Value Date   HGBA1C 6.1 (H) 05/20/2018   GLUCOSE 93 10/19/2018   GLUCOSE 110 (H) 05/20/2018   GLUCOSE 118 (H) 03/03/2013    Last seen for for this1 years ago.  Management since that visit includes dietary change. Current symptoms include none and have been unchanged.  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 He has transitioned to this in the past two months.   He has changed psychiatrists from Dr. Shea Evans to Dr. Altamese Milladore in [place] where he is followed for depresion -----------------------------------------------------------------------------------------  ---------------------------------------------------------------------------------------------------       Medications: Outpatient Medications Prior to Visit  Medication Sig  . ARIPiprazole (ABILIFY) 2 MG tablet Take 2 mg by mouth daily.  . busPIRone (BUSPAR) 10 MG tablet Take 1 tablet (  10 mg total) by mouth 2 (two) times daily.  . cyclobenzaprine (FLEXERIL) 10 MG tablet Take 10 mg by mouth at bedtime.  . docusate sodium (COLACE) 100 MG capsule Take 100 mg by mouth 2 (two) times daily.  . fluticasone (FLONASE) 50 MCG/ACT nasal spray SPRAY 2 SPRAYS INTO EACH NOSTRIL EVERY DAY  . gabapentin (NEURONTIN) 300 MG capsule Take 300 mg by mouth 3  (three) times daily. 1 capsule in the morning and 2 capsules at night  . HYDROcodone-acetaminophen (NORCO) 10-325 MG tablet Take 1 tablet by mouth every 12 (twelve) hours as needed.   . meloxicam (MOBIC) 15 MG tablet TAKE 1/2 TABLET ONCE DAILY SPARINGLY AS NEEDED FOR PAIN.  . traZODone (DESYREL) 50 MG tablet Take 1 tablet (50 mg total) by mouth at bedtime.  Marland Kitchen venlafaxine XR (EFFEXOR-XR) 150 MG 24 hr capsule venlafaxine ER 150 mg capsule,extended release 24 hr  . [DISCONTINUED] escitalopram (LEXAPRO) 10 MG tablet TAKE 1 TABLET BY MOUTH EVERY DAY (Patient not taking: Reported on 04/26/2019)  . [DISCONTINUED] losartan (COZAAR) 100 MG tablet TAKE 1 TABLET BY MOUTH EVERY DAY   No facility-administered medications prior to visit.    Review of Systems  Constitutional: Negative.   Respiratory: Negative.   Cardiovascular: Negative.   Hematological: Negative.       Objective    There were no vitals taken for this visit.     Assessment & Plan     1. Essential hypertension  - Comprehensive Metabolic Panel (CMET) - Lipid Profile - HgB A1c - losartan (COZAAR) 100 MG tablet; Take 1 tablet (100 mg total) by mouth daily.  Dispense: 90 tablet; Refill: 0  2. Prediabetes  - HgB A1c  3. MDD (major depressive disorder), single episode, moderate ([place])   4. Morbid obesity ([place])    No follow-ups on file.    I discussed the assessment and treatment plan with the patient. The patient was provided an opportunity to ask questions and all were answered. The patient agreed with the plan and demonstrated an understanding of the instructions.   The patient was advised to call back or seek an in-person evaluation if the symptoms worsen or if the condition fails to improve as anticipated.   ITrinna Post, PA-C, have reviewed all documentation for this visit. The documentation on 10/25/19 for the exam, diagnosis, procedures, and orders are all accurate and complete.   Paulene Floor [facility] [phone] (phone) [phone] (fax)  [place]

## 2019-11-18 LAB — COMPREHENSIVE METABOLIC PANEL
ALT: 21 IU/L (ref 0–44)
AST: 23 IU/L (ref 0–40)
Albumin/Globulin Ratio: 1.3 (ref 1.2–2.2)
Albumin: 4 g/dL (ref 3.8–4.9)
Alkaline Phosphatase: 104 IU/L (ref 48–121)
BUN/Creatinine Ratio: 16 (ref 9–20)
BUN: 17 mg/dL (ref 6–24)
Bilirubin Total: 0.4 mg/dL (ref 0.0–1.2)
CO2: 22 mmol/L (ref 20–29)
Calcium: 9.3 mg/dL (ref 8.7–10.2)
Chloride: 104 mmol/L (ref 96–106)
Creatinine, Ser: 1.04 mg/dL (ref 0.76–1.27)
GFR calc Af Amer: 94 mL/min/{1.73_m2} (ref >59)
GFR calc non Af Amer: 82 mL/min/{1.73_m2} (ref >59)
Globulin, Total: 3.2 g/dL (ref 1.5–4.5)
Glucose: 104 mg/dL — ABNORMAL HIGH (ref 65–99)
Potassium: 3.9 mmol/L (ref 3.5–5.2)
Sodium: 142 mmol/L (ref 134–144)
Total Protein: 7.2 g/dL (ref 6.0–8.5)

## 2019-11-18 LAB — LIPID PANEL
Chol/HDL Ratio: 3.8 ratio (ref 0.0–5.0)
Cholesterol, Total: 117 mg/dL (ref 100–199)
HDL: 31 mg/dL — ABNORMAL LOW (ref >39)
LDL Chol Calc (NIH): 64 mg/dL (ref 0–99)
Triglycerides: 119 mg/dL (ref 0–149)
VLDL Cholesterol Cal: 22 mg/dL (ref 5–40)

## 2019-11-18 LAB — HEMOGLOBIN A1C
Est. average glucose Bld gHb Est-mCnc: 123 mg/dL
Hgb A1c MFr Bld: 5.9 % — ABNORMAL HIGH (ref 4.8–5.6)

## 2019-11-19 ENCOUNTER — Telehealth: Payer: Self-pay

## 2019-11-19 DIAGNOSIS — I10 Essential (primary) hypertension: Secondary | ICD-10-CM

## 2019-11-19 MED ORDER — LOSARTAN POTASSIUM 100 MG PO TABS: 100.0000 mg | ORAL_TABLET | Freq: Every day | ORAL | 1 refills | Status: DC

## 2019-11-19 NOTE — Telephone Encounter (Signed)
Pt advised.  Also refilled Losartan 181m  Thanks,   -LMickel Baas

## 2019-11-19 NOTE — Telephone Encounter (Signed)
-----   Message from Mar Daring, [place] sent at 11/18/2019 12:18 PM EDT ----- Kidney and liver function is normal. Sodium, potassium and calcium are normal. Cholesterol is doing great. A1c is down to 5.9 from 6.1.

## 2019-12-13 NOTE — Progress Notes (Signed)
Established patient visit   Patient: Aaron [name]   DOB: 23-Feb-1966   54 y.o. Male  MRN: [id number] Visit Date: 12/14/2019  Today's healthcare provider: Trinna Post, PA-C   Chief Complaint  Patient presents with  . Hypertension  I,Latoy Labriola M Lainee Lehrman,acting as a scribe for [employer], PA-C.,have documented all relevant documentation on the behalf of Trinna Post, PA-C,as directed by  Trinna Post, PA-C while in the presence of Trinna Post, PA-C.  Subjective    HPI  Hypertension, follow-up  BP Readings from Last 3 Encounters:  12/14/19 130/82  10/29/18 (!) 145/106  10/19/18 (!) 163/114   Wt Readings from Last 3 Encounters:  12/14/19 298 lb (135.2 kg)  10/29/18 299 lb (135.6 kg)  10/19/18 297 lb 3.2 oz (134.8 kg)     He was last seen for hypertension 6 weeks ago.  BP at that visit was 145/106. Management since that visit includes no changes. Continue losartan 139m daily. Reports his BP was high at his functional exam for disability and they would not complete this.   He reports good compliance with treatment. He is not having side effects.  He is following a Regular diet. He is not exercising. He does not smoke.  Use of agents associated with hypertension: none.   Outside blood pressures are not being checked. Symptoms: No chest pain No chest pressure  No palpitations No syncope  No dyspnea No orthopnea  No paroxysmal nocturnal dyspnea No lower extremity edema   Pertinent labs: Lab Results  Component Value Date   CHOL 117 11/17/2019   HDL 31 (L) 11/17/2019   LDLCALC 64 11/17/2019   TRIG 119 11/17/2019   CHOLHDL 3.8 11/17/2019   Lab Results  Component Value Date   NA 142 11/17/2019   K 3.9 11/17/2019   CREATININE 1.04 11/17/2019   GFRNONAA 82 11/17/2019   GFRAA 94 11/17/2019   GLUCOSE 104 (H) 11/17/2019     The ASCVD Risk score (Goff DC Jr., et al., 2013) failed to calculate for the following reasons:   The valid total  cholesterol range is 130 to 320 mg/dL   Patient reports that he went to a place to have available work performs checked done and his blood pressure was 165/117.     Medications: Outpatient Medications Prior to Visit  Medication Sig  . ARIPiprazole (ABILIFY) 2 MG tablet Take 2 mg by mouth daily.  . busPIRone (BUSPAR) 10 MG tablet Take 1 tablet (10 mg total) by mouth 2 (two) times daily.  . cyclobenzaprine (FLEXERIL) 10 MG tablet Take 10 mg by mouth at bedtime.  . docusate sodium (COLACE) 100 MG capsule Take 100 mg by mouth 2 (two) times daily.  . fluticasone (FLONASE) 50 MCG/ACT nasal spray SPRAY 2 SPRAYS INTO EACH NOSTRIL EVERY DAY  . gabapentin (NEURONTIN) 300 MG capsule Take 300 mg by mouth 3 (three) times daily. 1 capsule in the morning and 2 capsules at night  . HYDROcodone-acetaminophen (NORCO) 10-325 MG tablet Take 1 tablet by mouth every 12 (twelve) hours as needed.   .Marland Kitchenlosartan (COZAAR) 100 MG tablet Take 1 tablet (100 mg total) by mouth daily.  . meloxicam (MOBIC) 15 MG tablet TAKE 1/2 TABLET ONCE DAILY SPARINGLY AS NEEDED FOR PAIN.  . traZODone (DESYREL) 50 MG tablet Take 1 tablet (50 mg total) by mouth at bedtime.  .Marland Kitchenvenlafaxine XR (EFFEXOR-XR) 150 MG 24 hr capsule venlafaxine ER 150 mg capsule,extended release 24 hr  No facility-administered medications prior to visit.    Review of Systems    Objective    BP 130/82 (BP Location: Left Arm, Patient Position: Sitting, Cuff Size: Large)   Pulse 70   Temp (!) 96.8 F (36 C) (Temporal)   Wt 298 lb (135.2 kg)   SpO2 99%   BMI 45.31 kg/m    Physical Exam Constitutional:      Appearance: Normal appearance.  Cardiovascular:     Rate and Rhythm: Normal rate and regular rhythm.     Pulses: Normal pulses.     Heart sounds: Normal heart sounds.  Pulmonary:     Effort: Pulmonary effort is normal.     Breath sounds: Normal breath sounds.  Skin:    General: Skin is warm and dry.  Neurological:     General: No focal  deficit present.     Mental Status: He is alert and oriented to person, place, and time.  Psychiatric:        Mood and Affect: Mood normal.        Behavior: Behavior normal.       No results found for any visits on 12/14/19.  Assessment & Plan    1. Essential hypertension  Some ischemic changes which are stable from prior EKG. Recommend following up with cardiology for potential stress testing. Filled out form excusing him from the treadmill portion of exam.   - EKG 12-Lead  2. Colon cancer screening  - Cologuard  3. Need for diphtheria-tetanus-pertussis (Tdap) vaccine  - Tdap vaccine greater than or equal to 7yo IM  4. Abnormal electrocardiogram (ECG) (EKG)  - Ambulatory referral to Cardiology   Return in about 6 months (around 06/15/2020), or if symptoms worsen or fail to improve, for HTN.      ITrinna Post, PA-C, have reviewed all documentation for this visit. The documentation on 12/17/19 for the exam, diagnosis, procedures, and orders are all accurate and complete.  I have spent 25 minutes with this patient, >50% of which was spent on counseling and coordination of care.     Paulene Floor  [facility] [phone] (phone) [phone] (fax)  [place]

## 2019-12-14 ENCOUNTER — Ambulatory Visit: Payer: 59 | Admitting: Physician Assistant

## 2019-12-14 ENCOUNTER — Other Ambulatory Visit: Payer: Self-pay

## 2019-12-14 ENCOUNTER — Encounter: Payer: Self-pay | Admitting: Physician Assistant

## 2019-12-14 VITALS — BP 130/82 | HR 70 | Temp 96.8°F | Wt 298.0 lb

## 2019-12-14 DIAGNOSIS — R9431 Abnormal electrocardiogram [ECG] [EKG]: Secondary | ICD-10-CM

## 2019-12-14 DIAGNOSIS — I10 Essential (primary) hypertension: Secondary | ICD-10-CM | POA: Diagnosis not present

## 2019-12-14 DIAGNOSIS — Z1211 Encounter for screening for malignant neoplasm of colon: Secondary | ICD-10-CM

## 2019-12-14 DIAGNOSIS — Z23 Encounter for immunization: Secondary | ICD-10-CM | POA: Diagnosis not present

## 2019-12-14 NOTE — Patient Instructions (Signed)

## 2019-12-17 ENCOUNTER — Telehealth: Payer: Self-pay | Admitting: Internal Medicine

## 2019-12-17 ENCOUNTER — Encounter: Payer: Self-pay | Admitting: Internal Medicine

## 2019-12-17 ENCOUNTER — Ambulatory Visit: Payer: 59 | Admitting: Internal Medicine

## 2019-12-17 ENCOUNTER — Other Ambulatory Visit: Payer: Self-pay

## 2019-12-17 VITALS — BP 150/100 | HR 75 | Ht 69.0 in | Wt 299.5 lb

## 2019-12-17 DIAGNOSIS — I10 Essential (primary) hypertension: Secondary | ICD-10-CM

## 2019-12-17 DIAGNOSIS — G453 Amaurosis fugax: Secondary | ICD-10-CM | POA: Diagnosis not present

## 2019-12-17 DIAGNOSIS — R079 Chest pain, unspecified: Secondary | ICD-10-CM | POA: Diagnosis not present

## 2019-12-17 HISTORY — DX: Chest pain, unspecified: R07.9

## 2019-12-17 MED ORDER — ASPIRIN EC 81 MG PO TBEC: 81.0000 mg | DELAYED_RELEASE_TABLET | Freq: Every day | ORAL | Status: DC

## 2019-12-17 NOTE — Patient Instructions (Signed)
Medication Instructions:  Your physician recommends that you continue on your current medications as directed. Please refer to the Current Medication list given to you today.  *If you need a refill on your cardiac medications before your next appointment, please call your pharmacy*  Lab Work: none If you have labs (blood work) drawn today and your tests are completely normal, you will receive your results only by: Marland Kitchen MyChart Message (if you have MyChart) OR . A paper copy in the mail If you have any lab test that is abnormal or we need to change your treatment, we will call you to review the results.  Testing/Procedures: 1- Your physician has requested that you have a carotid duplex. This test is an ultrasound of the carotid arteries in your neck. It looks at blood flow through these arteries that supply the brain with blood. Allow one hour for this exam. There are no restrictions or special instructions.  2- Your physician has requested that you have an echocardiogram. Echocardiography is a painless test that uses sound waves to create images of your heart. It provides your doctor with information about the size and shape of your heart and how well your heart's chambers and valves are working. This procedure takes approximately one hour. There are no restrictions for this procedure. You may get an IV, if needed, to receive an ultrasound enhancing agent through to better visualize your heart.   3- [place] STRESS TEST  Your physician has requested that you have a lexiscan myoview.   [place]  Your caregiver has ordered a Stress Test with nuclear imaging. The purpose of this test is to evaluate the blood supply to your heart muscle. This procedure is referred to as a "Non-Invasive Stress Test." This is because other than having an IV started in your vein, nothing is inserted or "invades" your body. Cardiac stress tests are done to find areas of poor blood flow to the heart by  determining the extent of coronary artery disease (CAD). Some patients exercise on a treadmill, which naturally increases the blood flow to your heart, while others who are  unable to walk on a treadmill due to physical limitations have a pharmacologic/chemical stress agent called Lexiscan . This medicine will mimic walking on a treadmill by temporarily increasing your coronary blood flow.   Please note: these test may take anywhere between 2-4 hours to complete  PLEASE REPORT TO [facility] AT THE FIRST DESK WILL DIRECT YOU WHERE TO GO  Date of Procedure:_____________________________________  Arrival Time for Procedure:______________________________   PLEASE NOTIFY THE OFFICE AT LEAST 24 HOURS IN ADVANCE IF YOU ARE UNABLE TO KEEP YOUR APPOINTMENT.  [phone] AND  PLEASE NOTIFY NUCLEAR MEDICINE AT [facility] AT LEAST 24 HOURS IN ADVANCE IF YOU ARE UNABLE TO KEEP YOUR APPOINTMENT. [phone]  How to prepare for your Myoview test:  1. Do not eat or drink after midnight 2. No caffeine for 24 hours prior to test 3. No smoking 24 hours prior to test. 4. Your medication may be taken with water.  If your doctor stopped a medication because of this test, do not take that medication. 5. Please wear a short sleeve shirt. 6. No perfume, cologne or lotion. 7. Wear comfortable walking shoes.   Follow-Up: At [facility], you and your health needs are our priority.  As part of our continuing mission to provide you with exceptional heart care, we have created designated Provider Care Teams.  These Care  Teams include your primary Cardiologist (physician) and Advanced Practice Providers (APPs -  Physician Assistants and Nurse Practitioners) who all work together to provide you with the care you need, when you need it.  We recommend signing up for the patient portal called "MyChart".  Sign up information is provided on this After Visit Summary.  MyChart is used to connect  with patients for Virtual Visits (Telemedicine).  Patients are able to view lab/test results, encounter notes, upcoming appointments, etc.  Non-urgent messages can be sent to your provider as well.   To learn more about what you can do with MyChart, go to [email or web address].    Your next appointment:   1 month(s)  The format for your next appointment:   In Person  Provider:    You may see DR Harrell Gave END or one of the following Advanced Practice Providers on your designated Care Team:    Murray Hodgkins, NP  Christell Faith, PA-C  Marrianne Mood, PA-C    Cardiac Nuclear Scan A cardiac nuclear scan is a test that measures blood flow to the heart when a person is resting and when he or she is exercising. The test looks for problems such as:  Not enough blood reaching a portion of the heart.  The heart muscle not working normally. You may need this test if:  You have heart disease.  You have had abnormal lab results.  You have had heart surgery or a balloon procedure to open up blocked arteries (angioplasty).  You have chest pain.  You have shortness of breath. In this test, a radioactive dye (tracer) is injected into your bloodstream. After the tracer has traveled to your heart, an imaging device is used to measure how much of the tracer is absorbed by or distributed to various areas of your heart. This procedure is usually done at a hospital and takes 2-4 hours. Tell a health care provider about:  Any allergies you have.  All medicines you are taking, including vitamins, herbs, eye drops, creams, and over-the-counter medicines.  Any problems you or family members have had with anesthetic medicines.  Any blood disorders you have.  Any surgeries you have had.  Any medical conditions you have.  Whether you are pregnant or may be pregnant. What are the risks? Generally, this is a safe procedure. However, problems may occur, including:  Serious chest pain and  heart attack. This is only a risk if the stress portion of the test is done.  Rapid heartbeat.  Sensation of warmth in your chest. This usually passes quickly.  Allergic reaction to the tracer. What happens before the procedure?  Ask your health care provider about changing or stopping your regular medicines. This is especially important if you are taking diabetes medicines or blood thinners.  Follow instructions from your health care provider about eating or drinking restrictions.  Remove your jewelry on the day of the procedure. What happens during the procedure?  An IV will be inserted into one of your veins.  Your health care provider will inject a small amount of radioactive tracer through the IV.  You will wait for 20-40 minutes while the tracer travels through your bloodstream.  Your heart activity will be monitored with an electrocardiogram (ECG).  You will lie down on an exam table.  Images of your heart will be taken for about 15-20 minutes.  You may also have a stress test. For this test, one of the following may be done: ? You will exercise on  a treadmill or stationary bike. While you exercise, your heart's activity will be monitored with an ECG, and your blood pressure will be checked. ? You will be given medicines that will increase blood flow to parts of your heart. This is done if you are unable to exercise.  When blood flow to your heart has peaked, a tracer will again be injected through the IV.  After 20-40 minutes, you will get back on the exam table and have more images taken of your heart.  Depending on the type of tracer used, scans may need to be repeated 3-4 hours later.  Your IV line will be removed when the procedure is over. The procedure may vary among health care providers and hospitals. What happens after the procedure?  Unless your health care provider tells you otherwise, you may return to your normal schedule, including diet, activities, and  medicines.  Unless your health care provider tells you otherwise, you may increase your fluid intake. This will help to flush the contrast dye from your body. Drink enough fluid to keep your urine pale yellow.  Ask your health care provider, or the department that is doing the test: ? When will my results be ready? ? How will I get my results? Summary  A cardiac nuclear scan measures the blood flow to the heart when a person is resting and when he or she is exercising.  Tell your health care provider if you are pregnant.  Before the procedure, ask your health care provider about changing or stopping your regular medicines. This is especially important if you are taking diabetes medicines or blood thinners.  After the procedure, unless your health care provider tells you otherwise, increase your fluid intake. This will help flush the contrast dye from your body.  After the procedure, unless your health care provider tells you otherwise, you may return to your normal schedule, including diet, activities, and medicines. This information is not intended to replace advice given to you by your health care provider. Make sure you discuss any questions you have with your health care provider. Document Revised: 11/03/2017 Document Reviewed: 11/03/2017 Elsevier Patient Education  [place].     Echocardiogram An echocardiogram is a procedure that uses painless sound waves (ultrasound) to produce an image of the heart. Images from an echocardiogram can provide important information about:  Signs of coronary artery disease (CAD).  Aneurysm detection. An aneurysm is a weak or damaged part of an artery wall that bulges out from the normal force of blood pumping through the body.  Heart size and shape. Changes in the size or shape of the heart can be associated with certain conditions, including heart failure, aneurysm, and CAD.  Heart muscle function.  Heart valve function.  Signs of  a past heart attack.  Fluid buildup around the heart.  Thickening of the heart muscle.  A tumor or infectious growth around the heart valves. Tell a health care provider about:  Any allergies you have.  All medicines you are taking, including vitamins, herbs, eye drops, creams, and over-the-counter medicines.  Any blood disorders you have.  Any surgeries you have had.  Any medical conditions you have.  Whether you are pregnant or may be pregnant. What are the risks? Generally, this is a safe procedure. However, problems may occur, including:  Allergic reaction to dye (contrast) that may be used during the procedure. What happens before the procedure? No specific preparation is needed. You may eat and drink normally.  What happens during the procedure?   An IV tube may be inserted into one of your veins.  You may receive contrast through this tube. A contrast is an injection that improves the quality of the pictures from your heart.  A gel will be applied to your chest.  A wand-like tool (transducer) will be moved over your chest. The gel will help to transmit the sound waves from the transducer.  The sound waves will harmlessly bounce off of your heart to allow the heart images to be captured in real-time motion. The images will be recorded on a computer. The procedure may vary among health care providers and hospitals. What happens after the procedure?  You may return to your normal, everyday life, including diet, activities, and medicines, unless your health care provider tells you not to do that. Summary  An echocardiogram is a procedure that uses painless sound waves (ultrasound) to produce an image of the heart.  Images from an echocardiogram can provide important information about the size and shape of your heart, heart muscle function, heart valve function, and fluid buildup around your heart.  You do not need to do anything to prepare before this procedure. You  may eat and drink normally.  After the echocardiogram is completed, you may return to your normal, everyday life, unless your health care provider tells you not to do that. This information is not intended to replace advice given to you by your health care provider. Make sure you discuss any questions you have with your health care provider. Document Revised: 09/10/2018 Document Reviewed: 06/22/2016 Elsevier Patient Education  [place].

## 2019-12-17 NOTE — Telephone Encounter (Signed)
Recieved from:[facility] Sent to [facility]

## 2019-12-17 NOTE — Progress Notes (Signed)
New Outpatient Visit Date: 12/17/2019  Referring Provider: Trinna Post, PA-C [address] [place],  [place] [address]  Chief Complaint: Chest pain  HPI:  Mr. Ly is a 54 y.o. male who is being seen today for the evaluation of abnormal EKG at the request of Ms. Pollak. He has a history of hypertension, kidney stones, chronic back pain, depression, and morbid obesity.  He was seen earlier this week by Ms. Pollack for follow-up of his hypertension.  EKG showed sinus rhythm with first-degree AV block, PACs, and anterolateral ST/T changes.  Today, Mr. Stgermaine reports that he has been having right-sided chest pain for the last week or 2. It comes and goes and is not exertional. It is sharp with radiation to the right arm. Typically lasts a few minutes. Mr. Denk reports having undergone a stress test about 10 years ago for palpitations. He was admitted in the hospital and told that he may have a heart attack, though this never panned out.  Mr. Ehmke also notes some vision changes. When he walks, he sometimes sees bright lights or feels like a shade is being pulled over both eyes. He denies palpitations and lightheadedness as well as edema and shortness of breath. He has a history of hypertension dating back at least 20 years. He feels like his blood pressure has generally been well controlled.  --------------------------------------------------------------------------------------------------  Cardiovascular History & Procedures: Cardiovascular Problems:  Chest pain  Abnormal EKG  Risk Factors:  Hypertension, male gender, and morbid obesity  Cath/PCI:  None  CV Surgery:  None  EP Procedures and Devices:  None  Non-Invasive Evaluation(s):  Exercise MPI (10/03/2011): Low risk study without evidence of ischemia.  LVEF 68%.  Recent CV Pertinent Labs: Lab Results  Component Value Date   CHOL 117 11/17/2019   HDL 31 (L) 11/17/2019   LDLCALC 64  11/17/2019   TRIG 119 11/17/2019   CHOLHDL 3.8 11/17/2019   K 3.9 11/17/2019   K 3.3 (L) 03/03/2013   BUN 17 11/17/2019   BUN 13 03/03/2013   CREATININE 1.04 11/17/2019   CREATININE 1.41 (H) 03/03/2013    --------------------------------------------------------------------------------------------------  Past Medical History:  Diagnosis Date  . Chronic back pain    disc  . Hypertension   . Kidney stone   . MDD (major depressive disorder)     Past Surgical History:  Procedure Laterality Date  . KIDNEY STONE SURGERY    . VASECTOMY      Current Meds  Medication Sig  . ARIPiprazole (ABILIFY) 2 MG tablet Take 2 mg by mouth daily.  . fluticasone (FLONASE) 50 MCG/ACT nasal spray SPRAY 2 SPRAYS INTO EACH NOSTRIL EVERY DAY  . gabapentin (NEURONTIN) 300 MG capsule Take 300 mg by mouth 3 (three) times daily. 1 capsule in the morning and 2 capsules at night  . HYDROcodone-acetaminophen (NORCO) 10-325 MG tablet Take 1 tablet by mouth every 12 (twelve) hours as needed.   Marland Kitchen losartan (COZAAR) 100 MG tablet Take 1 tablet (100 mg total) by mouth daily.  . traZODone (DESYREL) 50 MG tablet Take 1 tablet (50 mg total) by mouth at bedtime.    Allergies: Patient has no known allergies.  Social History   Tobacco Use  . Smoking status: Never Smoker  . Smokeless tobacco: Never Used  Vaping Use  . Vaping Use: Never used  Substance Use Topics  . Alcohol use: Not Currently  . Drug use: Never    Family History  Problem Relation Age of Onset  .  COPD Mother   . Cancer Mother   . Hypertension Father   . Congestive Heart Failure Father   . COPD Father   . Lung cancer Father   . Heart attack Father 41  . Hypertension Sister   . Diabetes Paternal Grandfather   . Heart attack Paternal Grandfather   . Healthy Son   . Healthy Daughter     Review of Systems: A 12-system review of systems was performed and was negative except as noted in the  HPI.  --------------------------------------------------------------------------------------------------  Physical Exam: BP (!) 150/100 (BP Location: Right Arm, Patient Position: Sitting, Cuff Size: Large)   Pulse 75   Ht 5' 9"  (1.753 m)   Wt 299 lb 8 oz (135.9 kg)   SpO2 97%   BMI 44.23 kg/m   General: NAD. HEENT: No conjunctival pallor or scleral icterus. Facemask in place. Neck: Supple without lymphadenopathy, thyromegaly, JVD, or HJR, though body habitus limits evaluation. No carotid bruit. Lungs: Normal work of breathing. Clear to auscultation bilaterally without wheezes or crackles. Heart: Regular rate and rhythm without murmurs, rubs, or gallops. Unable to assess PMI due to body habitus. Abd: Bowel sounds present. Soft, NT/ND. Unable to assess HSM due to body habitus. Ext: No lower extremity edema. Radial, PT, and DP pulses are 2+ bilaterally Skin: Warm and dry without rash. Neuro: CNIII-XII intact. Strength and fine-touch sensation intact in upper and lower extremities bilaterally. Psych: Normal mood and affect.  EKG: Normal sinus rhythm with first-degree AV block (PR interval 260 ms) PACs, and anterolateral T wave inversions. No significant change from prior EKGs dating back to 10/01/2011.  Lab Results  Component Value Date   WBC 7.2 10/19/2018   HGB 15.8 10/19/2018   HCT 45.4 10/19/2018   MCV 85 10/19/2018   PLT 382 10/19/2018    Lab Results  Component Value Date   NA 142 11/17/2019   K 3.9 11/17/2019   CL 104 11/17/2019   CO2 22 11/17/2019   BUN 17 11/17/2019   CREATININE 1.04 11/17/2019   GLUCOSE 104 (H) 11/17/2019   ALT 21 11/17/2019    Lab Results  Component Value Date   CHOL 117 11/17/2019   HDL 31 (L) 11/17/2019   LDLCALC 64 11/17/2019   TRIG 119 11/17/2019   CHOLHDL 3.8 11/17/2019   --------------------------------------------------------------------------------------------------  ASSESSMENT AND PLAN: Atypical chest pain: Mr. Obar  presents with nonexertional right-sided chest pain rating to the right arm. EKG shows anterolateral T wave inversions concerning for ischemia, though they have been present dating back to 2013. Myocardial perfusion stress test at that time was low risk without evidence of ischemia. Given his multiple cardiac risk factors, I have recommended a pharmacologic myocardial perfusion stress test, as Mr. Berroa does not believe that he would be able to exercise on a treadmill due to back pain. I will also arrange for a transthoracic echocardiogram, as EKG changes could also be seen with LVH and abnormal repolarization. In the meantime, I have asked Mr. Laine to begin taking aspirin 81 mg daily.  Amaurosis fugax: Mr. Wickens reports occasional bright spots in his vision as well as darkening of his vision. Symptoms are unusual but could be an indicator of cerebrovascular disease. No carotid bruit was appreciated on exam today. I will proceed with carotid Dopplers. I recommended addition of aspirin 81 mg daily.  Hypertension: Blood pressure mildly elevated today but typically better at home. We will defer medication changes at this time.  Morbid obesity: BMI greater than 40. Mr.  Carollo would benefit from weight loss through diet and exercise, though I favor deferring routine exercise regimen until after completion of aforementioned cardiac testing.  Follow-up: Return to clinic in 1 month.  Nelva Bush, MD 12/17/2019 3:56 PM

## 2019-12-21 ENCOUNTER — Other Ambulatory Visit: Payer: Self-pay | Admitting: Physician Assistant

## 2019-12-21 DIAGNOSIS — R21 Rash and other nonspecific skin eruption: Secondary | ICD-10-CM

## 2019-12-23 NOTE — Telephone Encounter (Signed)
Received forms from [facility] placed in nurse box.

## 2019-12-28 NOTE — Telephone Encounter (Signed)
Forms placed on Dr Darnelle Bos desk for review when he returns. He is out of office this week.

## 2020-01-04 ENCOUNTER — Other Ambulatory Visit: Payer: Self-pay

## 2020-01-04 ENCOUNTER — Ambulatory Visit
Admission: RE | Admit: 2020-01-04 | Discharge: 2020-01-04 | Disposition: A | Discharge status: Home / Self Care | Payer: 59 | Source: Ambulatory Visit | Attending: Internal Medicine | Admitting: Internal Medicine

## 2020-01-04 ENCOUNTER — Encounter
Admission: RE | Admit: 2020-01-04 | Discharge: 2020-01-04 | Disposition: A | Discharge status: Home / Self Care | Payer: 59 | Source: Ambulatory Visit | Attending: Internal Medicine | Admitting: Internal Medicine

## 2020-01-04 DIAGNOSIS — R079 Chest pain, unspecified: Secondary | ICD-10-CM | POA: Insufficient documentation

## 2020-01-04 LAB — NM MYOCAR MULTI W/SPECT W/WALL MOTION / EF
LV dias vol: 159 mL (ref 62–150)
LV sys vol: 78 mL
Peak HR: 95 {beats}/min
Rest HR: 72 {beats}/min
TID: 1.11

## 2020-01-04 MED ORDER — REGADENOSON 0.4 MG/5ML IV SOLN
0.4000 mg | Freq: Once | INTRAVENOUS | Status: AC
  Administered 2020-01-04: 0.4 mg via INTRAVENOUS
  Filled 2020-01-04: qty 5

## 2020-01-04 MED ORDER — TECHNETIUM TC 99M TETROFOSMIN IV KIT
30.1260 | PACK | Freq: Once | INTRAVENOUS | Status: AC | PRN
  Administered 2020-01-04: 30.126 via INTRAVENOUS

## 2020-01-04 MED ORDER — TECHNETIUM TC 99M TETROFOSMIN IV KIT
10.5990 | PACK | Freq: Once | INTRAVENOUS | Status: AC | PRN
  Administered 2020-01-04: 10.599 via INTRAVENOUS

## 2020-01-04 NOTE — Telephone Encounter (Signed)
Forms completed by Dr End and given to front office staff.

## 2020-01-04 NOTE — Telephone Encounter (Signed)
Faxed completed forms and notes to [facility] . Release completed.

## 2020-01-05 ENCOUNTER — Telehealth: Payer: Self-pay | Admitting: Internal Medicine

## 2020-01-05 MED ORDER — METOPROLOL SUCCINATE ER 25 MG PO TB24: 25.0000 mg | ORAL_TABLET | Freq: Every day | ORAL | 5 refills | Status: DC

## 2020-01-05 NOTE — Telephone Encounter (Signed)
Myocardial perfusion stress test results were reviewed with Mr. Aaron [name], including possible area of ischemia involving the inferolateral wall. We have discussed further work-up with cardiac catheterization versus medical therapy and close outpatient follow-up as well as proceeding with echocardiogram as scheduled for later this month. Mr. Aaron [name] wishes to pursue medical therapy and close outpatient follow-up. We will initiate metoprolol succinate 25 mg daily. I advised Mr. Aaron [name] to contact us if he has worsening symptoms.  Nelva Bush, MD [facility]

## 2020-01-12 ENCOUNTER — Other Ambulatory Visit: Payer: Self-pay | Admitting: Internal Medicine

## 2020-01-12 DIAGNOSIS — G453 Amaurosis fugax: Secondary | ICD-10-CM

## 2020-01-19 ENCOUNTER — Other Ambulatory Visit: Payer: Self-pay

## 2020-01-19 ENCOUNTER — Ambulatory Visit (INDEPENDENT_AMBULATORY_CARE_PROVIDER_SITE_OTHER): Payer: 59

## 2020-01-19 DIAGNOSIS — G453 Amaurosis fugax: Secondary | ICD-10-CM | POA: Diagnosis not present

## 2020-01-19 DIAGNOSIS — R079 Chest pain, unspecified: Secondary | ICD-10-CM | POA: Diagnosis not present

## 2020-01-19 LAB — ECHOCARDIOGRAM COMPLETE
AR max vel: 6.26 cm2
AV Area VTI: 6.13 cm2
AV Area mean vel: 5.67 cm2
AV Mean grad: 2 mmHg
AV Peak grad: 4.6 mmHg
Ao pk vel: 1.07 m/s
Area-P 1/2: 3.61 cm2

## 2020-01-19 MED ORDER — PERFLUTREN LIPID MICROSPHERE
1.0000 mL | INTRAVENOUS | Status: AC | PRN
  Administered 2020-01-19: 2 mL via INTRAVENOUS

## 2020-01-21 ENCOUNTER — Ambulatory Visit: Payer: 59 | Admitting: Family

## 2020-01-21 ENCOUNTER — Other Ambulatory Visit: Payer: Self-pay

## 2020-01-21 ENCOUNTER — Encounter: Payer: Self-pay | Admitting: Family

## 2020-01-21 VITALS — BP 150/90 | HR 80 | Ht 69.0 in | Wt 301.0 lb

## 2020-01-21 DIAGNOSIS — I10 Essential (primary) hypertension: Secondary | ICD-10-CM

## 2020-01-21 DIAGNOSIS — R079 Chest pain, unspecified: Secondary | ICD-10-CM

## 2020-01-21 MED ORDER — ISOSORBIDE MONONITRATE ER 30 MG PO TB24
30.0000 mg | ORAL_TABLET | Freq: Every day | ORAL | 3 refills | Status: DC
Start: 2020-01-21 — End: 2020-03-30

## 2020-01-21 NOTE — Progress Notes (Signed)
Office Visit    Patient Name: Aaron [name] Date of Encounter: 01/22/2020  Primary Care Provider:  Trinna Post, PA-C Primary Cardiologist:  Nelva Bush, MD Electrophysiologist:  None   Chief Complaint    Aaron [name] is a 54 y.o. male with a hx of HTN, kdiney stones, chronic back pain, depression, morbid obesity, first degree AV block, abnormal EKG  presents today for follow up after cardiac testing.   Past Medical History    Past Medical History:  Diagnosis Date  . Chronic back pain    disc  . Hypertension   . Kidney stone   . MDD (major depressive disorder)    Past Surgical History:  Procedure Laterality Date  . KIDNEY STONE SURGERY    . VASECTOMY      Allergies  Allergies  Allergen Reactions  . No Known Allergies     History of Present Illness    Aaron [name] is a 54 y.o. male with a hx of HTN, kdiney stones, chronic back pain, depression, morbid obesity, first degree AV block, abnormal EKG last seen 12/17/19 by Dr. Saunders Revel.  Stress test 01/04/20 with possible area of ischemia involving inferolateral wall. Discussed with Dr. Saunders Revel via telephone and requested to proceed with medical therapy and close outpatient follow up. He was started on Toprol 72m daily.  He had subsequent carotid duplex and echocardiogram.   Lipid panel 11/17/19 with total cholesterol 117, HDL 31, triglycerides 119, LDL 64.   Presents today for follow up to discuss Myoview, Echo & Carotid u/s results. Reports some dyspnea on exertion that is stable compared to previous. Tells me this has been ongoing for a few years and worsening though stable over the last month.  Reports palpitations during the daytime.  Of note, he is on Adderall.  Tells me when he working previously he took Adderral twice per day for 10 years.  His psychiatrist just started him on it 3 months ago for energy.  Reports no change in his palpitations on or off Adderall.  They feel like it intermittent fast heart  sensation. Since starting the Toprol he feels like his heart doesn't go quite as fast. Sometimes feels like he has to catch his breath to get the heart   Does not have a BP cuff at home. Notices when he changes position he will get lightheaded.  No near syncope nor syncope. Reports no chest pain, pressure, tightness.  EKGs/Labs/Other Studies Reviewed:   The following studies were reviewed today:  Lexiscan Myoview 01/04/20 Abnormal pharmacologic myocardial perfusion stress test. There is a moderate in size, mild in severity, reversible defect involving the inferolateral myocardium that may reflect subtle ischemia but cannot rule out artifact. Left ventricular systolic function is low normal to mildly reduced by visual estimation and Siemens calculation (51%). QGS calculation of 35% is likely artificially low due to gating parameters. There is no significant coronary artery calcification on the attenuation correction CT. Sensitivity and specificity of this study are degraded by body habitus. This is an intermediate risk study.  Carotid Doppler 01/19/20 Summary:  Right Carotid: There was no evidence of thrombus, dissection,  atherosclerotic plaque or stenosis in the cervical carotid system.   Left Carotid: There was no evidence of thrombus, dissection,  atherosclerotic plaque or stenosis in the cervical carotid system.   Echo 01/19/20  1. Left ventricular ejection fraction, by estimation, is 55 to 60%. The  left ventricle has normal function. The left ventricle has no regional  wall motion abnormalities. Left ventricular diastolic parameters are  consistent with Grade I diastolic  dysfunction (impaired relaxation).   2. Right ventricular systolic function is normal. The right ventricular  size is normal.   3. Left atrial size was moderately dilated.   4. Mild mitral valve regurgitation.   EKG:  EKG is ordered today.  The ekg ordered today demonstrates SR 80 bpm with 1st degree AV block  (PR 256). Noted Prolonged QT Qt 408/QTc 470) with PAC's and stable TWI in anterolateral leads.  Recent Labs: 11/17/2019: ALT 21; BUN 17; Creatinine, Ser 1.04; Potassium 3.9; Sodium 142  Recent Lipid Panel    Component Value Date/Time   CHOL 117 11/17/2019 1003   TRIG 119 11/17/2019 1003   HDL 31 (L) 11/17/2019 1003   CHOLHDL 3.8 11/17/2019 1003   LDLCALC 64 11/17/2019 1003    Home Medications   Current Meds  Medication Sig  . amphetamine-dextroamphetamine (ADDERALL XR) 15 MG 24 hr capsule dextroamphetamine-amphetamine ER 15 mg 24hr capsule,extend release  TAKE 1 CAPSULE BY MOUTH EVERY DAY  . ARIPiprazole (ABILIFY) 2 MG tablet Take 2 mg by mouth daily.  Marland Kitchen aspirin EC 81 MG tablet Take 1 tablet (81 mg total) by mouth daily. Swallow whole.  . fluticasone (FLONASE) 50 MCG/ACT nasal spray SPRAY 2 SPRAYS INTO EACH NOSTRIL EVERY DAY  . gabapentin (NEURONTIN) 300 MG capsule Take 300 mg by mouth 3 (three) times daily. 1 capsule in the morning and 2 capsules at night  . HYDROcodone-acetaminophen (NORCO) 10-325 MG tablet Take 1 tablet by mouth every 12 (twelve) hours as needed.   Marland Kitchen losartan (COZAAR) 100 MG tablet Take 1 tablet (100 mg total) by mouth daily.  . metoprolol succinate (TOPROL XL) 25 MG 24 hr tablet Take 1 tablet (25 mg total) by mouth daily.  . traZODone (DESYREL) 50 MG tablet Take 1 tablet (50 mg total) by mouth at bedtime.     Review of Systems     Review of Systems  Constitutional: Negative for chills, fever and malaise/fatigue.  Cardiovascular: Positive for dyspnea on exertion and palpitations. Negative for chest pain, near-syncope, orthopnea and syncope.  Respiratory: Negative for cough, shortness of breath and wheezing.   Gastrointestinal: Negative for nausea and vomiting.  Neurological: Positive for light-headedness. Negative for dizziness and weakness.   All other systems reviewed and are otherwise negative except as noted above.  Physical Exam    VS:  BP (!) 150/90  (BP Location: Left Arm, Patient Position: Sitting, Cuff Size: Large)   Pulse 80   Ht 5' 9"  (1.753 m)   Wt (!) 301 lb (136.5 kg)   SpO2 98%   BMI 44.45 kg/m  , BMI Body mass index is 44.45 kg/m. GEN: Well nourished, overweight, well developed, in no acute distress. HEENT: normal. Neck: Supple, no carotid bruits or masses. Cardiac: RRR, no murmurs, rubs, or gallops. No clubbing, cyanosis, edema.  Radials/PT 2+ and equal bilaterally.  Respiratory:  Respirations regular and unlabored, clear to auscultation bilaterally. GI: Soft, nontender, nondistended. MS: No deformity or atrophy. Skin: Warm and dry, no rash. Neuro:  Strength and sensation are intact. Psych: Normal affect.  Assessment & Plan    1. Atypical chest pain - Previous right sided chest pain which was nonexertional and atypical for angina. EKG with stable anterolateral TWI present dating back to 2013. Lexiscan myoview 01/04/20 with moderate in size mild in severity reversible defect in inferolateral myocardium which may reflect subtle ischemic but cannot rule out artifact. Sensitivy and  specificity of study degrated by body habitus. Intermediate risk study. Echo 01/19/20 EF 55-60%, no RWMA,mild MR. No recurrent chest pain and stable DO Eover hte last few months which is likely obesity/deconditioning. Conitnue medical management with Toprol, Aspirin.   2. Amaurosis fugax - Occasional bright spots and darkening of vision. No carotid bruit. Carotid duplex with no stenosis. No indication for further cardiac evaluation. Encouraged to discuss with PCP.   3. Palpitations - Reports intermittent palpitations with sensation of fast heart rate. PACs again noted on EKG today. Does have mild prolonged QT which is likely exacerbated by his utilization of Adderal. Continue Toprol. He politely declines ZIO monitor today and we will readdress at follow up.   4. HTN - BP not at goal. Continue Losartan 170m daily. Start Imdur 349mdaily. Encouraged to  purchase arm BP cuff for monitoring.   5. Morbid obesity - BMI >40. Weight loss via diet and exercise encouraged. Discussed that much of his DOE is likely due to deconditioning and obesity as recent echo without evidence of HF or significant valvular abnormality.  Disposition: Follow up in 3 week(s) with Dr. EnSaunders Revelr APP   CaLoel DubonnetNP 01/22/2020, 1:04 PM

## 2020-01-21 NOTE — Patient Instructions (Addendum)
Medication Instructions:  Your physician has recommended you make the following change in your medication:  1.  START Imdur 30 mg taking 1 tablet daily  You may take all of your medications at bed time with your Gabapentin.  *If you need a refill on your cardiac medications before your next appointment, please call your pharmacy*   Lab Work: None ordered  If you have labs (blood work) drawn today and your tests are completely normal, you will receive your results only by: Marland Kitchen MyChart Message (if you have MyChart) OR . A paper copy in the mail If you have any lab test that is abnormal or we need to change your treatment, we will call you to review the results.   Testing/Procedures: None ordered   Follow-Up: At [facility], you and your health needs are our priority.  As part of our continuing mission to provide you with exceptional heart care, we have created designated Provider Care Teams.  These Care Teams include your primary Cardiologist (physician) and Advanced Practice Providers (APPs -  Physician Assistants and Nurse Practitioners) who all work together to provide you with the care you need, when you need it.  We recommend signing up for the patient portal called "MyChart".  Sign up information is provided on this After Visit Summary.  MyChart is used to connect with patients for Virtual Visits (Telemedicine).  Patients are able to view lab/test results, encounter notes, upcoming appointments, etc.  Non-urgent messages can be sent to your provider as well.   To learn more about what you can do with MyChart, go to [email or web address].    Your next appointment:   3 WEEKS   The format for your next appointment:   In Person  Provider:    You may see Nelva Bush, MD or one of the following Advanced Practice Providers on your designated Care Team:    Murray Hodgkins, NP  Christell Faith, PA-C  Marrianne Mood, PA-C    Other Instructions  Please get a blood  pressure cuff and monitor your blood pressures daily.

## 2020-01-24 NOTE — Telephone Encounter (Signed)
Received additional  request from [employer] for medical records, forwareded to [facility].

## 2020-01-25 ENCOUNTER — Telehealth: Payer: Self-pay | Admitting: Internal Medicine

## 2020-01-25 NOTE — Telephone Encounter (Signed)
Received forms from [employer] to be completed Placed in nurse box

## 2020-01-28 NOTE — Telephone Encounter (Signed)
Faxed completed forms to [facility]

## 2020-02-09 NOTE — Progress Notes (Signed)
MyChart Video Visit    Virtual Visit via Video Note   This visit type was conducted due to national recommendations for restrictions regarding the COVID-19 Pandemic (e.g. social distancing) in an effort to limit this patient's exposure and mitigate transmission in our community. This patient is at least at moderate risk for complications without adequate follow up. This format is felt to be most appropriate for this patient at this time. Physical exam was limited by quality of the video and audio technology used for the visit.   Patient location: Home Provider location: Office   I discussed the limitations of evaluation and management by telemedicine and the availability of in person appointments. The patient expressed understanding and agreed to proceed.    Patient: Aaron [name]   DOB: August 13, 1965   54 y.o. Male  MRN: [id number] Visit Date: 02/10/2020  Today's healthcare provider: Trinna Post, PA-C   Chief Complaint  Patient presents with  . Sinus Problem  I,Aaron [name],acting as a scribe for Aaron Post, PA-C.,have documented all relevant documentation on the behalf of Aaron Post, PA-C,as directed by  Aaron Post, PA-C while in the presence of Aaron Post, PA-C.  Subjective    Sinus Problem This is a new problem. The current episode started 1 to 4 weeks ago. The problem has been gradually improving since onset. There has been no fever. His pain is at a severity of 0/10. He is experiencing no pain. Associated symptoms include congestion, coughing, sneezing and a sore throat. Pertinent negatives include no chills, headaches, hoarse voice, shortness of breath or sinus pressure. Past treatments include oral decongestants and acetaminophen. The treatment provided mild relief.    He has tried Mucinex, Administrator plus which are helpful for a short period of time. No facial pain, no sinus pain. He has some tongue soreness. He has not been vaccinated  for COVID and he has not been tested for COVID since he had symptoms.     Medications: Outpatient Medications Prior to Visit  Medication Sig  . amphetamine-dextroamphetamine (ADDERALL XR) 15 MG 24 hr capsule dextroamphetamine-amphetamine ER 15 mg 24hr capsule,extend release  TAKE 1 CAPSULE BY MOUTH EVERY DAY  . ARIPiprazole (ABILIFY) 2 MG tablet Take 2 mg by mouth daily.  Marland Kitchen aspirin EC 81 MG tablet Take 1 tablet (81 mg total) by mouth daily. Swallow whole.  . fluticasone (FLONASE) 50 MCG/ACT nasal spray SPRAY 2 SPRAYS INTO EACH NOSTRIL EVERY DAY  . gabapentin (NEURONTIN) 300 MG capsule Take 300 mg by mouth 3 (three) times daily. 1 capsule in the morning and 2 capsules at night  . HYDROcodone-acetaminophen (NORCO) 10-325 MG tablet Take 1 tablet by mouth every 12 (twelve) hours as needed.   . isosorbide mononitrate (IMDUR) 30 MG 24 hr tablet Take 1 tablet (30 mg total) by mouth daily.  Marland Kitchen losartan (COZAAR) 100 MG tablet Take 1 tablet (100 mg total) by mouth daily.  . metoprolol succinate (TOPROL XL) 25 MG 24 hr tablet Take 1 tablet (25 mg total) by mouth daily.  . traZODone (DESYREL) 50 MG tablet Take 1 tablet (50 mg total) by mouth at bedtime.   No facility-administered medications prior to visit.    Review of Systems  Constitutional: Negative for activity change, appetite change, chills, fatigue and fever.  HENT: Positive for congestion, postnasal drip, rhinorrhea, sneezing and sore throat. Negative for hoarse voice, sinus pressure, sinus pain and trouble swallowing.   Respiratory: Positive for cough and wheezing. Negative  for chest tightness and shortness of breath.   Cardiovascular: Negative for chest pain.  Neurological: Negative for weakness and headaches.      Objective    There were no vitals taken for this visit.   Physical Exam Constitutional:      Appearance: Normal appearance.  Pulmonary:     Effort: Pulmonary effort is normal. No respiratory distress.  Neurological:       Mental Status: He is alert.  Psychiatric:        Mood and Affect: Mood normal.        Behavior: Behavior normal.        Assessment & Plan    1. Acute non-recurrent frontal sinusitis  Advised to start 2nd gen antihistamine and then may take amoxicillin 875 mg BID x 7 days if not improving.   - benzonatate (TESSALON PERLES) 100 MG capsule; Take 1 capsule (100 mg total) by mouth 3 (three) times daily as needed for up to 7 days.  Dispense: 21 capsule; Refill: 0  2. Environmental allergies  - cetirizine (ZYRTEC) 10 MG tablet; Take 1 tablet (10 mg total) by mouth daily.  Dispense: 30 tablet; Refill: 11    No follow-ups on file.     I discussed the assessment and treatment plan with the patient. The patient was provided an opportunity to ask questions and all were answered. The patient agreed with the plan and demonstrated an understanding of the instructions.   The patient was advised to call back or seek an in-person evaluation if the symptoms worsen or if the condition fails to improve as anticipated.   ITrinna Post, PA-C, have reviewed all documentation for this visit. The documentation on 02/14/20 for the exam, diagnosis, procedures, and orders are all accurate and complete.  The entirety of the information documented in the History of Present Illness, Review of Systems and Physical Exam were personally obtained by me. Portions of this information were initially documented by [facility] and reviewed by me for thoroughness and accuracy.    Aaron [name] [facility] [phone] (phone) [phone] (fax)  [place]

## 2020-02-10 ENCOUNTER — Telehealth (INDEPENDENT_AMBULATORY_CARE_PROVIDER_SITE_OTHER): Payer: Medicare Other | Admitting: Physician Assistant

## 2020-02-10 DIAGNOSIS — Z9109 Other allergy status, other than to drugs and biological substances: Secondary | ICD-10-CM

## 2020-02-10 DIAGNOSIS — J0110 Acute frontal sinusitis, unspecified: Secondary | ICD-10-CM | POA: Diagnosis not present

## 2020-02-10 MED ORDER — AMOXICILLIN 875 MG PO TABS: 875.0000 mg | ORAL_TABLET | Freq: Two times a day (BID) | ORAL | 0 refills | Status: DC

## 2020-02-10 MED ORDER — CETIRIZINE HCL 10 MG PO TABS: 10.0000 mg | ORAL_TABLET | Freq: Every day | ORAL | 11 refills | Status: DC

## 2020-02-10 MED ORDER — BENZONATATE 100 MG PO CAPS: 100.0000 mg | ORAL_CAPSULE | Freq: Three times a day (TID) | ORAL | 0 refills | Status: AC | PRN

## 2020-02-11 ENCOUNTER — Ambulatory Visit (INDEPENDENT_AMBULATORY_CARE_PROVIDER_SITE_OTHER): Payer: Medicare Other | Admitting: Family

## 2020-02-11 ENCOUNTER — Encounter: Payer: Self-pay | Admitting: Family

## 2020-02-11 ENCOUNTER — Other Ambulatory Visit: Payer: Self-pay

## 2020-02-11 ENCOUNTER — Telehealth: Payer: Self-pay | Admitting: Internal Medicine

## 2020-02-11 VITALS — BP 126/96 | HR 78 | Ht 69.0 in | Wt 305.0 lb

## 2020-02-11 DIAGNOSIS — I10 Essential (primary) hypertension: Secondary | ICD-10-CM

## 2020-02-11 DIAGNOSIS — R079 Chest pain, unspecified: Secondary | ICD-10-CM | POA: Diagnosis not present

## 2020-02-11 DIAGNOSIS — R002 Palpitations: Secondary | ICD-10-CM | POA: Diagnosis not present

## 2020-02-11 NOTE — Telephone Encounter (Signed)
Received form from [employer] notes and work status .  Placed in nurse box.

## 2020-02-11 NOTE — Progress Notes (Signed)
Office Visit    Patient Name: Aaron [name] Date of Encounter: 02/11/2020  Primary Care Provider:  Trinna Post, PA-C Primary Cardiologist:  Nelva Bush, MD Electrophysiologist:  None   Chief Complaint    Aaron [name] is a 54 y.o. male with a hx of HTN, kdiney stones, chronic back pain, depression, morbid obesity, first degree AV block, abnormal EKG  presents today for follow up of chest pain and palpitations.   Past Medical History    Past Medical History:  Diagnosis Date  . Chronic back pain    disc  . Hypertension   . Kidney stone   . MDD (major depressive disorder)    Past Surgical History:  Procedure Laterality Date  . KIDNEY STONE SURGERY    . VASECTOMY      Allergies  Allergies  Allergen Reactions  . No Known Allergies     History of Present Illness    Aaron [name] is a 54 y.o. male with a hx of HTN, kdiney stones, chronic back pain, depression, morbid obesity, first degree AV block, abnormal EKG last seen 01/21/20  Stress test 01/04/20 with possible area of ischemia involving inferolateral wall. Discussed with Dr. Saunders Revel via telephone and requested to proceed with medical therapy and close outpatient follow up. He was started on Toprol 78m daily.  He had subsequent carotid duplex and echocardiogram.   Lipid panel 11/17/19 with total cholesterol 117, HDL 31, triglycerides 119, LDL 64.   Seen in clinic 01/21/20. Noted stable DOE. He also noted palpitations which had improved somewhat since addition of Metoprolol. Imdur was added to his medication regimen for elevated BP.   Presents today for follow up. Reports stable DOE. Reports no recurrent chest discomfort. Does note continued palpitations which are intermittent, occur primarily at rest, and not associated with shortness of breath. He is interested in pursuing ZIO monitoring.   EKGs/Labs/Other Studies Reviewed:   The following studies were reviewed today:  Lexiscan Myoview  01/04/20 Abnormal pharmacologic myocardial perfusion stress test. There is a moderate in size, mild in severity, reversible defect involving the inferolateral myocardium that may reflect subtle ischemia but cannot rule out artifact. Left ventricular systolic function is low normal to mildly reduced by visual estimation and Siemens calculation (51%). QGS calculation of 35% is likely artificially low due to gating parameters. There is no significant coronary artery calcification on the attenuation correction CT. Sensitivity and specificity of this study are degraded by body habitus. This is an intermediate risk study.  Carotid Doppler 01/19/20 Summary:  Right Carotid: There was no evidence of thrombus, dissection,  atherosclerotic plaque or stenosis in the cervical carotid system.   Left Carotid: There was no evidence of thrombus, dissection,  atherosclerotic plaque or stenosis in the cervical carotid system.   Echo 01/19/20  1. Left ventricular ejection fraction, by estimation, is 55 to 60%. The  left ventricle has normal function. The left ventricle has no regional  wall motion abnormalities. Left ventricular diastolic parameters are  consistent with Grade I diastolic  dysfunction (impaired relaxation).   2. Right ventricular systolic function is normal. The right ventricular  size is normal.   3. Left atrial size was moderately dilated.   4. Mild mitral valve regurgitation.   EKG:  EKG is ordered today.  The ekg ordered today demonstrates SR 78 bpm with 1st degree AV block (PR 266). Noted Prolonged QT Qt 406/462 which is stable compared to previous with PAC's and stable TWI in anterolateral leads.  Recent Labs: 11/17/2019: ALT 21; BUN 17; Creatinine, Ser 1.04; Potassium 3.9; Sodium 142  Recent Lipid Panel    Component Value Date/Time   CHOL 117 11/17/2019 1003   TRIG 119 11/17/2019 1003   HDL 31 (L) 11/17/2019 1003   CHOLHDL 3.8 11/17/2019 1003   LDLCALC 64 11/17/2019 1003     Home Medications   Current Meds  Medication Sig  . amoxicillin (AMOXIL) 875 MG tablet Take 875 mg by mouth 2 (two) times daily.  Marland Kitchen amphetamine-dextroamphetamine (ADDERALL XR) 15 MG 24 hr capsule dextroamphetamine-amphetamine ER 15 mg 24hr capsule,extend release  TAKE 1 CAPSULE BY MOUTH EVERY DAY  . ARIPiprazole (ABILIFY) 2 MG tablet Take 2 mg by mouth daily.  . ARIPiprazole (ABILIFY) 5 MG tablet aripiprazole 5 mg tablet  TAKE 1 TABLET BY MOUTH EVERY DAY  . aspirin EC 81 MG tablet Take 1 tablet (81 mg total) by mouth daily. Swallow whole.  . benzonatate (TESSALON PERLES) 100 MG capsule Take 1 capsule (100 mg total) by mouth 3 (three) times daily as needed for up to 7 days.  . cetirizine (ZYRTEC) 10 MG tablet Take 1 tablet (10 mg total) by mouth daily.  . fluticasone (FLONASE) 50 MCG/ACT nasal spray SPRAY 2 SPRAYS INTO EACH NOSTRIL EVERY DAY  . gabapentin (NEURONTIN) 300 MG capsule Take 300 mg by mouth 3 (three) times daily. 1 capsule in the morning and 2 capsules at night  . gabapentin (NEURONTIN) 400 MG capsule Take 400 mg by mouth 3 (three) times daily.  Marland Kitchen HYDROcodone-acetaminophen (NORCO) 10-325 MG tablet Take 1 tablet by mouth every 12 (twelve) hours as needed.   . isosorbide mononitrate (IMDUR) 30 MG 24 hr tablet Take 1 tablet (30 mg total) by mouth daily.  Marland Kitchen losartan (COZAAR) 100 MG tablet Take 1 tablet (100 mg total) by mouth daily.  . metoprolol succinate (TOPROL XL) 25 MG 24 hr tablet Take 1 tablet (25 mg total) by mouth daily.  . traZODone (DESYREL) 50 MG tablet Take 1 tablet (50 mg total) by mouth at bedtime.  . [DISCONTINUED] amoxicillin (AMOXIL) 875 MG tablet Take 1 tablet (875 mg total) by mouth 2 (two) times daily for 7 days.     Review of Systems     Review of Systems  Constitutional: Negative for chills, fever and malaise/fatigue.  Cardiovascular: Positive for dyspnea on exertion and palpitations. Negative for chest pain, near-syncope, orthopnea and syncope.   Respiratory: Negative for cough, shortness of breath and wheezing.   Gastrointestinal: Negative for nausea and vomiting.  Neurological: Positive for light-headedness. Negative for dizziness and weakness.   All other systems reviewed and are otherwise negative except as noted above.  Physical Exam    VS:  BP (!) 126/96 (BP Location: Left Arm, Patient Position: Sitting, Cuff Size: Large)   Pulse 78   Ht 5' 9"  (1.753 m)   Wt (!) 305 lb (138.3 kg)   SpO2 97%   BMI 45.04 kg/m  , BMI Body mass index is 45.04 kg/m. GEN: Well nourished, overweight, well developed, in no acute distress. HEENT: normal. Neck: Supple, no carotid bruits or masses. Cardiac: RRR, no murmurs, rubs, or gallops. No clubbing, cyanosis, edema.  Radials/PT 2+ and equal bilaterally.  Respiratory:  Respirations regular and unlabored, clear to auscultation bilaterally. GI: Soft, nontender, nondistended. MS: No deformity or atrophy. Skin: Warm and dry, no rash. Neuro:  Strength and sensation are intact. Psych: Normal affect.  Assessment & Plan    1. Atypical chest pain - Previous right  sided chest pain which was nonexertional and atypical for angina. No recurrence. EKG with stable anterolateral TWI present dating back to 2013. Lexiscan myoview 01/04/20 with moderate in size mild in severity reversible defect in inferolateral myocardium which may reflect subtle ischemic but cannot rule out artifact. Sensitivy and specificity of study degrated by body habitus. Intermediate risk study. Echo 01/19/20 EF 55-60%, no RWMA,mild MR. Conitnue medical management with Toprol, Aspirin, Imdur.   2. Palpitations - Reports intermittent palpitations with sensation of fast heart rate. PACs noted on EKG which are stable compared to previous. . Does have mild prolonged QT which is likely exacerbated by his utilization of Adderal. Continue Toprol. Plan for 14 day ZIO monitor - we will mail to his home as he has vacation next week.   3. HTN - BP  well controlled. Continue current antihypertensive regimen including Losartan 140m daily, imdur 371mdaily. Encouraged to purchase arm BP cuff for monitoring.   4. Morbid obesity - BMI >40. Weight loss via diet and exercise encouraged. Discussed that much of his DOE is likely due to deconditioning and obesity as recent echo without evidence of HF or significant valvular abnormality.  Disposition: Follow up in 6 week(s) with Dr. EnSaunders Revelr APP   CaLoel DubonnetNP 02/11/2020, 4:23 PM

## 2020-02-11 NOTE — Patient Instructions (Signed)
Medication Instructions:  - Your physician recommends that you continue on your current medications as directed. Please refer to the Current Medication list given to you today.  *If you need a refill on your cardiac medications before your next appointment, please call your pharmacy*   Lab Work: - none ordered  If you have labs (blood work) drawn today and your tests are completely normal, you will receive your results only by:  [place] (if you have MyChart) OR  A paper copy in the mail If you have any lab test that is abnormal or we need to change your treatment, we will call you to review the results.   Testing/Procedures: - Your physician has recommended that you wear a 14 day Zio XT monitor (will be shipped to your home on 02/18/20). This monitor is a medical device that records the hearts electrical activity. Doctors most often use these monitors to diagnose arrhythmias. Arrhythmias are problems with the speed or rhythm of the heartbeat. The monitor is a small device applied to your chest. You can wear one while you do your normal daily activities. While wearing this monitor if you have any symptoms to push the button and record what you felt. Once you have worn this monitor for the period of time provider prescribed (Usually 14 days), you will return the monitor device in the postage paid box. Once it is returned they will download the data collected and provide [place] with a report which the provider will then review and we will call you with those results. Important tips:  1. Avoid showering during the first 24 hours of wearing the monitor. 2. Avoid excessive sweating to help maximize wear time. 3. Do not submerge the device, no hot tubs, and no swimming pools. 4. Keep any lotions or oils away from the patch. 5. After 24 hours you may shower with the patch on. Take brief showers with your back facing the shower head.  6. Do not remove patch once it has been placed because that  will interrupt data and decrease adhesive wear time. 7. Push the button when you have any symptoms and write down what you were feeling. 8. Once you have completed wearing your monitor, remove and place into box which has postage paid and place in your outgoing mailbox.  9. If for some reason you have misplaced your box then call our office and we can provide another box and/or mail it off for you.         Follow-Up: At [facility], you and your health needs are our priority.  As part of our continuing mission to provide you with exceptional heart care, we have created designated Provider Care Teams.  These Care Teams include your primary Cardiologist (physician) and Advanced Practice Providers (APPs -  Physician Assistants and Nurse Practitioners) who all work together to provide you with the care you need, when you need it.  We recommend signing up for the patient portal called "MyChart".  Sign up information is provided on this After Visit Summary.  MyChart is used to connect with patients for Virtual Visits (Telemedicine).  Patients are able to view lab/test results, encounter notes, upcoming appointments, etc.  Non-urgent messages can be sent to your provider as well.   To learn more about what you can do with MyChart, go to [email or web address].    Your next appointment:   6 week(s)  The format for your next appointment:   In Person  Provider:   Nelva Bush,  MD (only)   Other Instructions - n/a

## 2020-02-15 NOTE — Telephone Encounter (Signed)
Forms placed in Dr Darnelle Bos office.

## 2020-02-16 NOTE — Telephone Encounter (Signed)
Faxed completed forms to [facility]

## 2020-02-18 ENCOUNTER — Ambulatory Visit (INDEPENDENT_AMBULATORY_CARE_PROVIDER_SITE_OTHER): Payer: Medicare Other

## 2020-02-18 DIAGNOSIS — R002 Palpitations: Secondary | ICD-10-CM | POA: Diagnosis not present

## 2020-02-25 ENCOUNTER — Telehealth: Payer: Self-pay | Admitting: Physician Assistant

## 2020-02-25 DIAGNOSIS — J0110 Acute frontal sinusitis, unspecified: Secondary | ICD-10-CM

## 2020-02-25 MED ORDER — AMOXICILLIN 875 MG PO TABS: 875.0000 mg | ORAL_TABLET | Freq: Two times a day (BID) | ORAL | 0 refills | Status: AC

## 2020-02-25 NOTE — Telephone Encounter (Signed)
Pt called to see if he can get a refill of the antibiotic he was prescribed a couple weeks ago fir his sinus infection/ Pt stated it started to clear up but once he finished the round of medication it started to return/ please advise   Pt asked if this can be done today

## 2020-03-02 NOTE — Telephone Encounter (Signed)
Received a duplicate request for forms and notes.    Faxed aagain to [facility] and called the patient to let him know.

## 2020-03-06 ENCOUNTER — Telehealth: Payer: Self-pay | Admitting: *Deleted

## 2020-03-06 MED ORDER — METOPROLOL SUCCINATE ER 50 MG PO TB24: 50.0000 mg | ORAL_TABLET | Freq: Every day | ORAL | 2 refills | Status: DC

## 2020-03-06 NOTE — Telephone Encounter (Signed)
Results called to pt. Pt verbalized understanding. He is aware to increase metoprolol succinate to 50 mg daily. Rx sent to pharmacy.

## 2020-03-06 NOTE — Telephone Encounter (Signed)
-----   Message from Loel Dubonnet, NP sent at 03/06/2020  8:08 AM EDT ----- Monitor shows primarily normal sinus rhythm. He did have early beats in the top chambers of the heart called PACs which occurred 13% of the time. Recommend increasing Metoprolol Succinate to 46m

## 2020-03-14 ENCOUNTER — Ambulatory Visit (INDEPENDENT_AMBULATORY_CARE_PROVIDER_SITE_OTHER): Payer: Medicare Other | Admitting: Physician Assistant

## 2020-03-14 DIAGNOSIS — R0981 Nasal congestion: Secondary | ICD-10-CM | POA: Diagnosis not present

## 2020-03-14 DIAGNOSIS — J329 Chronic sinusitis, unspecified: Secondary | ICD-10-CM | POA: Diagnosis not present

## 2020-03-14 DIAGNOSIS — J0110 Acute frontal sinusitis, unspecified: Secondary | ICD-10-CM

## 2020-03-14 MED ORDER — MONTELUKAST SODIUM 10 MG PO TABS: 10.0000 mg | ORAL_TABLET | Freq: Every day | ORAL | 1 refills | Status: DC

## 2020-03-14 MED ORDER — FLUTICASONE PROPIONATE 50 MCG/ACT NA SUSP: NASAL | 2 refills | Status: DC

## 2020-03-14 MED ORDER — DOXYCYCLINE HYCLATE 100 MG PO TABS: 100.0000 mg | ORAL_TABLET | Freq: Two times a day (BID) | ORAL | 0 refills | Status: AC

## 2020-03-14 NOTE — Progress Notes (Signed)
MyChart Video Visit    Virtual Visit via Video Note   This visit type was conducted due to national recommendations for restrictions regarding the COVID-19 Pandemic (e.g. social distancing) in an effort to limit this patient's exposure and mitigate transmission in our community. This patient is at least at moderate risk for complications without adequate follow up. This format is felt to be most appropriate for this patient at this time. Physical exam was limited by quality of the video and audio technology used for the visit.   Patient location: Home Provider location: Office   I discussed the limitations of evaluation and management by telemedicine and the availability of in person appointments. The patient expressed understanding and agreed to proceed.  Patient: Aaron [name]   DOB: April 22, 1966   54 y.o. Male  MRN: [id number] Visit Date: 03/14/2020  Today's healthcare provider: Trinna Post, PA-C   Chief Complaint  Patient presents with  . Cough   Subjective    Cough This is a new problem. The current episode started 1 to 4 weeks ago. The problem has been gradually worsening. The problem occurs constantly. The cough is productive of sputum. Associated symptoms include chest pain, a fever, headaches, nasal congestion, postnasal drip, a sore throat, shortness of breath and wheezing. He has tried OTC cough suppressant for the symptoms. The treatment provided no relief.    He was treated for sinusitis on 02/10/2020 with 7 days of Augmentin and then this was refilled when he reported continuing symptoms. Reported symptoms improved but worsened again after discontinuation of abx. He is now taking 2nd generation antihistamine.     Medications: Outpatient Medications Prior to Visit  Medication Sig  . amphetamine-dextroamphetamine (ADDERALL XR) 15 MG 24 hr capsule dextroamphetamine-amphetamine ER 15 mg 24hr capsule,extend release  TAKE 1 CAPSULE BY MOUTH EVERY DAY  . ARIPiprazole  (ABILIFY) 2 MG tablet Take 2 mg by mouth daily.  . ARIPiprazole (ABILIFY) 5 MG tablet aripiprazole 5 mg tablet  TAKE 1 TABLET BY MOUTH EVERY DAY  . aspirin EC 81 MG tablet Take 1 tablet (81 mg total) by mouth daily. Swallow whole.  . cetirizine (ZYRTEC) 10 MG tablet Take 1 tablet (10 mg total) by mouth daily.  . fluticasone (FLONASE) 50 MCG/ACT nasal spray SPRAY 2 SPRAYS INTO EACH NOSTRIL EVERY DAY  . gabapentin (NEURONTIN) 300 MG capsule Take 300 mg by mouth 3 (three) times daily. 1 capsule in the morning and 2 capsules at night  . gabapentin (NEURONTIN) 400 MG capsule Take 400 mg by mouth 3 (three) times daily.  Marland Kitchen HYDROcodone-acetaminophen (NORCO) 10-325 MG tablet Take 1 tablet by mouth every 12 (twelve) hours as needed.   . isosorbide mononitrate (IMDUR) 30 MG 24 hr tablet Take 1 tablet (30 mg total) by mouth daily.  Marland Kitchen losartan (COZAAR) 100 MG tablet Take 1 tablet (100 mg total) by mouth daily.  . metoprolol succinate (TOPROL-XL) 50 MG 24 hr tablet Take 1 tablet (50 mg total) by mouth daily. Take with or immediately following a meal.  . traZODone (DESYREL) 100 MG tablet trazodone 100 mg tablet  TAKE 1 TABLET BY MOUTH EVERYDAY AT BEDTIME (Patient not taking: Reported on 02/11/2020)  . traZODone (DESYREL) 50 MG tablet Take 1 tablet (50 mg total) by mouth at bedtime.   No facility-administered medications prior to visit.    Review of Systems  Constitutional: Positive for fever.  HENT: Positive for postnasal drip and sore throat.   Respiratory: Positive for cough, shortness of  breath and wheezing.   Cardiovascular: Positive for chest pain.  Neurological: Positive for headaches.       Objective    There were no vitals taken for this visit.    Physical Exam Constitutional:      Appearance: Normal appearance.  Pulmonary:     Effort: Pulmonary effort is normal. No respiratory distress.  Neurological:     Mental Status: He is alert.  Psychiatric:        Mood and Affect: Mood normal.         Behavior: Behavior normal.        Assessment & Plan     1. Recurrent sinusitis  Recurrent and worsening. Switch abx. If not improving, see ENT. Start singulair as below.   - doxycycline (VIBRA-TABS) 100 MG tablet; Take 1 tablet (100 mg total) by mouth 2 (two) times daily for 10 days.  Dispense: 20 tablet; Refill: 0 - montelukast (SINGULAIR) 10 MG tablet; Take 1 tablet (10 mg total) by mouth at bedtime.  Dispense: 90 tablet; Refill: 1 - Ambulatory referral to ENT  2. Sinus congestion  - fluticasone (FLONASE) 50 MCG/ACT nasal spray; SPRAY 2 SPRAYS INTO EACH NOSTRIL EVERY DAY  Dispense: 48 mL; Refill: 2   No follow-ups on file.     I discussed the assessment and treatment plan with the patient. The patient was provided an opportunity to ask questions and all were answered. The patient agreed with the plan and demonstrated an understanding of the instructions.   The patient was advised to call back or seek an in-person evaluation if the symptoms worsen or if the condition fails to improve as anticipated.   ITrinna Post, PA-C, have reviewed all documentation for this visit. The documentation on 03/15/20 for the exam, diagnosis, procedures, and orders are all accurate and complete.  The entirety of the information documented in the History of Present Illness, Review of Systems and Physical Exam were personally obtained by me. Portions of this information were initially documented by Wilburt Finlay, CMA and reviewed by me for thoroughness and accuracy.    Paulene Floor [facility] [phone] (phone) [phone] (fax)  [place]

## 2020-03-30 ENCOUNTER — Other Ambulatory Visit: Payer: Self-pay

## 2020-03-30 ENCOUNTER — Encounter: Payer: Self-pay | Admitting: Internal Medicine

## 2020-03-30 ENCOUNTER — Ambulatory Visit (INDEPENDENT_AMBULATORY_CARE_PROVIDER_SITE_OTHER): Payer: Medicare Other | Admitting: Internal Medicine

## 2020-03-30 VITALS — BP 130/90 | HR 77 | Ht 68.0 in | Wt 307.4 lb

## 2020-03-30 DIAGNOSIS — I10 Essential (primary) hypertension: Secondary | ICD-10-CM | POA: Diagnosis not present

## 2020-03-30 DIAGNOSIS — R9431 Abnormal electrocardiogram [ECG] [EKG]: Secondary | ICD-10-CM

## 2020-03-30 DIAGNOSIS — R002 Palpitations: Secondary | ICD-10-CM | POA: Diagnosis not present

## 2020-03-30 DIAGNOSIS — R0602 Shortness of breath: Secondary | ICD-10-CM

## 2020-03-30 MED ORDER — ISOSORBIDE MONONITRATE ER 30 MG PO TB24: 15.0000 mg | ORAL_TABLET | Freq: Every day | ORAL | 2 refills | Status: DC

## 2020-03-30 MED ORDER — METOPROLOL SUCCINATE ER 50 MG PO TB24: 25.0000 mg | ORAL_TABLET | Freq: Every day | ORAL | 2 refills | Status: DC

## 2020-03-30 NOTE — Progress Notes (Signed)
Follow-up Outpatient Visit Date: 03/30/2020  Primary Care Provider: Trinna Post, PA-C [address] [address]  Chief Complaint: Follow-up palpitations  HPI:  Aaron [name] is a 54 y.o. male with history of hypertension, kidney stones, chronic back pain, depression, and morbid obesity, who presents for follow-up of abnormal EKG and chest pain.  He was last seen in our office in August by Laurann Montana, NP.  Preceding echo showed LVEF 55-60% with grade 1 diastolic dysfunction and mild MR.  Carotid Doppler was normal.  Event monitor showed sinus rhythm with frequent PAC's and brief PSVT.  Pharmacologic MPI was intermediate risk with inferolateral reversible defect that may represent subtle ischemia and less likely artifact.  Medical management was recommended at his last visit.  Today, Aaron [name] reports that he continues to have palpitations a few times a week.  They are associated with transient lightheadedness.  He has not had any chest pain or edema.  He has felt somewhat short of breath which he attributes to ongoing sinus infection that has required 3 rounds of antibiotics.  He has not been using his CPAP, as in the past he would frequently take it off at night.  He has been cutting one of his heart medications in half due to associated lightheadedness.  He is unsure if this is the metoprolol or isosorbide mononitrate.  --------------------------------------------------------------------------------------------------  Past Medical History:  Diagnosis Date   Chronic back pain    disc   Hypertension    Kidney stone    MDD (major depressive disorder)    Past Surgical History:  Procedure Laterality Date   KIDNEY STONE SURGERY     VASECTOMY      Current Meds  Medication Sig   aspirin EC 81 MG tablet Take 1 tablet (81 mg total) by mouth daily. Swallow whole.   cetirizine (ZYRTEC) 10 MG tablet Take 1 tablet (10 mg total) by mouth daily.    fluticasone (FLONASE) 50 MCG/ACT nasal spray SPRAY 2 SPRAYS INTO EACH NOSTRIL EVERY DAY   gabapentin (NEURONTIN) 400 MG capsule Take 400 mg by mouth 3 (three) times daily.   HYDROcodone-acetaminophen (NORCO) 10-325 MG tablet Take 1 tablet by mouth every 12 (twelve) hours as needed.    isosorbide mononitrate (IMDUR) 30 MG 24 hr tablet Take 1 tablet (30 mg total) by mouth daily.   losartan (COZAAR) 100 MG tablet Take 1 tablet (100 mg total) by mouth daily.   meloxicam (MOBIC) 15 MG tablet Take 15 mg by mouth daily.   metoprolol succinate (TOPROL-XL) 50 MG 24 hr tablet Take 1 tablet (50 mg total) by mouth daily. Take with or immediately following a meal.   montelukast (SINGULAIR) 10 MG tablet Take 1 tablet (10 mg total) by mouth at bedtime.    Allergies: No known allergies  Social History   Tobacco Use   Smoking status: Never Smoker   Smokeless tobacco: Never Used  Vaping Use   Vaping Use: Never used  Substance Use Topics   Alcohol use: Not Currently   Drug use: Never    Family History  Problem Relation Age of Onset   COPD Mother    Cancer Mother    Hypertension Father    Congestive Heart Failure Father    COPD Father    Lung cancer Father    Heart attack Father 61   Hypertension Sister    Diabetes Paternal Grandfather    Heart attack Paternal Grandfather    Healthy Son  Healthy Daughter     Review of Systems: A 12-system review of systems was performed and was negative except as noted in the HPI.  --------------------------------------------------------------------------------------------------  Physical Exam: BP 130/90 (BP Location: Left Arm, Patient Position: Sitting, Cuff Size: Large)    Pulse 77    Ht 5' 8"  (1.727 m)    Wt (!) 307 lb 6 oz (139.4 kg)    SpO2 98%    BMI 46.74 kg/m   General: NAD. Neck: No JVD or HJR. Lungs: Clear to auscultation bilaterally without wheezes or crackles. Heart: Regular rate and rhythm without murmurs, rubs,  or gallops. Abdomen: Soft, nontender, nondistended. Extremities: No lower extremity edema.  EKG: Sinus rhythm with first-degree AV block (PR interval 296 ms) and anterolateral ST/T changes, not significantly different from prior tracings.  QTc mildly elevated (468).  Lab Results  Component Value Date   WBC 7.2 10/19/2018   HGB 15.8 10/19/2018   HCT 45.4 10/19/2018   MCV 85 10/19/2018   PLT 382 10/19/2018    Lab Results  Component Value Date   NA 142 11/17/2019   K 3.9 11/17/2019   CL 104 11/17/2019   CO2 22 11/17/2019   BUN 17 11/17/2019   CREATININE 1.04 11/17/2019   GLUCOSE 104 (H) 11/17/2019   ALT 21 11/17/2019    Lab Results  Component Value Date   CHOL 117 11/17/2019   HDL 31 (L) 11/17/2019   LDLCALC 64 11/17/2019   TRIG 119 11/17/2019   CHOLHDL 3.8 11/17/2019    --------------------------------------------------------------------------------------------------  ASSESSMENT AND PLAN: Palpitations: Symptoms have not improved much from her prior visit.  Prior monitor showed PACs as well as runs of SVT that likely are causing his symptoms.  I reassured him that no dangerous arrhythmia was identified.  He has experienced some recent dizziness and decreased either isosorbide mononitrate or metoprolol.  EKG today again shows marked first-degree AV block.  I have recommended decreasing metoprolol to 25 mg daily.  Ultimately, this will likely need to be weaned off.  If palpitations worsen or do not improve and remain bothersome, we may ultimately need to refer Aaron [name] to EP for antiarrhythmic guidance.  Abnormal EKG and shortness of breath: Aaron [name] believes that his dyspnea is mostly related to sinus issues.  He has not had any significant chest pain though on further questioning he endorses some vague tightness when he exerts himself a lot.  EKG today shows anterolateral ST/T changes that have been present for several years.  However, in light of myocardial perfusion  stress test that was intermediate risk with possible inferolateral ischemia versus artifact, I have recommended that we consider proceeding with cardiac catheterization to definitively assess his coronary anatomy.  This would also be useful if antiarrhythmic therapy such as a class I antiarrhythmic, needs to be considered in the future.  Aaron [name] wishes to defer this for now.  As above, we will decrease metoprolol due to first-degree AV block and dizziness.  He should continue his current dose of isosorbide mononitrate (15 mg daily).  Hypertension: Blood pressure borderline elevated today.  I suspect this may worsen as we de-escalate his metoprolol.  However, I will defer adding another agent at this time and have him return in 2 to 3 weeks to reassess his blood pressure and symptoms.  Morbid obesity: BMI greater than 45.  Weight loss encouraged through diet and exercise.  Follow-up: Return to clinic in 2 to 3 weeks.  Nelva Bush, MD 03/30/2020 3:09 PM

## 2020-03-30 NOTE — Patient Instructions (Signed)
Medication Instructions:  Your physician has recommended you make the following change in your medication:  1- DECREASE Metoprolol to 25 mg (0.5 tablet) by mouth once a day. 2- Continue on Imdur 15 mg by mouth daily.  *If you need a refill on your cardiac medications before your next appointment, please call your pharmacy*  Follow-Up: At [facility], you and your health needs are our priority.  As part of our continuing mission to provide you with exceptional heart care, we have created designated Provider Care Teams.  These Care Teams include your primary Cardiologist (physician) and Advanced Practice Providers (APPs -  Physician Assistants and Nurse Practitioners) who all work together to provide you with the care you need, when you need it.  We recommend signing up for the patient portal called "MyChart".  Sign up information is provided on this After Visit Summary.  MyChart is used to connect with patients for Virtual Visits (Telemedicine).  Patients are able to view lab/test results, encounter notes, upcoming appointments, etc.  Non-urgent messages can be sent to your provider as well.   To learn more about what you can do with MyChart, go to [email or web address].    Your next appointment:   2-3 week(s)  The format for your next appointment:   In Person  Provider:   You may see Nelva Bush, MD or one of the following Advanced Practice Providers on your designated Care Team:    Murray Hodgkins, NP  Christell Faith, PA-C  Marrianne Mood, PA-C  [place], [place]

## 2020-03-31 ENCOUNTER — Encounter: Payer: Self-pay | Admitting: Internal Medicine

## 2020-03-31 DIAGNOSIS — R002 Palpitations: Secondary | ICD-10-CM

## 2020-03-31 DIAGNOSIS — R0602 Shortness of breath: Secondary | ICD-10-CM | POA: Insufficient documentation

## 2020-03-31 DIAGNOSIS — R9431 Abnormal electrocardiogram [ECG] [EKG]: Secondary | ICD-10-CM | POA: Insufficient documentation

## 2020-03-31 HISTORY — DX: Palpitations: R00.2

## 2020-03-31 HISTORY — DX: Shortness of breath: R06.02

## 2020-03-31 HISTORY — DX: Abnormal electrocardiogram (ECG) (EKG): R94.31

## 2020-04-21 ENCOUNTER — Ambulatory Visit: Payer: Medicare Other | Admitting: Family

## 2020-04-26 ENCOUNTER — Other Ambulatory Visit
Admission: RE | Admit: 2020-04-26 | Discharge: 2020-04-26 | Disposition: A | Discharge status: Home / Self Care | Payer: Medicare Other | Source: Ambulatory Visit | Attending: Family | Admitting: Family

## 2020-04-26 ENCOUNTER — Ambulatory Visit (INDEPENDENT_AMBULATORY_CARE_PROVIDER_SITE_OTHER): Payer: Medicare Other | Admitting: Family

## 2020-04-26 ENCOUNTER — Other Ambulatory Visit: Payer: Self-pay

## 2020-04-26 ENCOUNTER — Telehealth: Payer: Self-pay | Admitting: Internal Medicine

## 2020-04-26 ENCOUNTER — Encounter: Payer: Self-pay | Admitting: Family

## 2020-04-26 VITALS — BP 142/100 | HR 88 | Ht 68.0 in | Wt 307.0 lb

## 2020-04-26 DIAGNOSIS — I491 Atrial premature depolarization: Secondary | ICD-10-CM | POA: Insufficient documentation

## 2020-04-26 DIAGNOSIS — R002 Palpitations: Secondary | ICD-10-CM

## 2020-04-26 DIAGNOSIS — R0600 Dyspnea, unspecified: Secondary | ICD-10-CM | POA: Diagnosis present

## 2020-04-26 DIAGNOSIS — R9439 Abnormal result of other cardiovascular function study: Secondary | ICD-10-CM

## 2020-04-26 DIAGNOSIS — R0609 Other forms of dyspnea: Secondary | ICD-10-CM

## 2020-04-26 DIAGNOSIS — I10 Essential (primary) hypertension: Secondary | ICD-10-CM | POA: Diagnosis not present

## 2020-04-26 DIAGNOSIS — Z0279 Encounter for issue of other medical certificate: Secondary | ICD-10-CM

## 2020-04-26 LAB — CBC
HCT: 46.8 % (ref 39.0–52.0)
Hemoglobin: 16 g/dL (ref 13.0–17.0)
MCH: 30.9 pg (ref 26.0–34.0)
MCHC: 34.2 g/dL (ref 30.0–36.0)
MCV: 90.5 fL (ref 80.0–100.0)
Platelets: 363 10*3/uL (ref 150–400)
RBC: 5.17 MIL/uL (ref 4.22–5.81)
RDW: 12.3 % (ref 11.5–15.5)
WBC: 10.1 10*3/uL (ref 4.0–10.5)
nRBC: 0 % (ref 0.0–0.2)

## 2020-04-26 LAB — BASIC METABOLIC PANEL
Anion gap: 10 (ref 5–15)
BUN: 15 mg/dL (ref 6–20)
CO2: 23 mmol/L (ref 22–32)
Calcium: 9 mg/dL (ref 8.9–10.3)
Chloride: 107 mmol/L (ref 98–111)
Creatinine, Ser: 0.98 mg/dL (ref 0.61–1.24)
GFR, Estimated: >60 mL/min (ref >60)
Glucose, Bld: 90 mg/dL (ref 70–99)
Potassium: 3.7 mmol/L (ref 3.5–5.1)
Sodium: 140 mmol/L (ref 135–145)

## 2020-04-26 NOTE — Progress Notes (Signed)
Office Visit    Patient Name: Aaron [name] Date of Encounter: 04/26/2020  Primary Care Provider:  Trinna Post, PA-C Primary Cardiologist:  Nelva Bush, MD Electrophysiologist:  None   Chief Complaint    Aaron [name] is a 54 y.o. male with a hx of HTN, kdiney stones, chronic back pain, depression, morbid obesity, first degree AV block, abnormal EKG  presents today for follow-up after reduced dose of metoprolol due to lightheadedness.  Past Medical History    Past Medical History:  Diagnosis Date  . Chronic back pain    disc  . Hypertension   . Kidney stone   . MDD (major depressive disorder)    Past Surgical History:  Procedure Laterality Date  . KIDNEY STONE SURGERY    . VASECTOMY      Allergies  Allergies  Allergen Reactions  . No Known Allergies     History of Present Illness    Aaron [name] is a 54 y.o. male with a hx of HTN, kdiney stones, chronic back pain, depression, morbid obesity, first degree AV block, abnormal EKG last seen 03/30/2020 by Dr. Saunders Revel  Stress test 01/04/20 with possible area of ischemia involving inferolateral wall. Discussed with Dr. Saunders Revel via telephone and requested to proceed with medical therapy and close outpatient follow up. He was started on Toprol 67m daily.  He had subsequent carotid duplex and echocardiogram.  Echo 01/19/2020 LVEF 55 to 60 percent, no wall motion abnormalities, grade 1 diastolic dysfunction, LA moderately dilated, mild MR.  Carotid duplex 01/19/2020 without evidence of thrombus, dissection, atherosclerotic plaque or stenosis in bilateral carotid arteries.  Lipid panel 11/17/19 with total cholesterol 117, HDL 31, triglycerides 119, LDL 64.   Seen in clinic 01/21/20. Noted stable DOE. He also noted palpitations which had improved somewhat since addition of Metoprolol. Imdur was added to his medication regimen for elevated BP.  He was seen in follow-up 02/11/2020 with palpitations improved though still  occurring and as such ZIO monitoring was ordered.  ZIO showed predominantly normal sinus rhythm with a frequent PAC with burden of 13% and 42 atrial runs with longest 11 beats and up to 164 bpm.  Metoprolol was increased to 50 mg daily.  When seen in follow-up 03/22/2020 he noted some dizziness and he had marked first-degree AV block and as such metoprolol was reduced to 25 mg daily.  Noted he may require referral to EP for antiarrhythmic guidance.  He was recommended to consider cardiac catheterization as his mother cardiac perfusion stress test was in her immediate risk with possible inferolateral ischemia versus infarct.  Reports his lightheadedness has improved some since last seen. Tells me he notices it first "in my eyes". Tells me it is worse with activity. Also notes dyspnea with activity such as walking in the grocery store. No near syncope nor syncope. He had some near syncope last month, but this has not recurred. Reports palpitations "all the time" which occur daily and are fleeting. No noted exacerbating nor relieving factors.   EKGs/Labs/Other Studies Reviewed:   The following studies were reviewed today:  Lexiscan Myoview 01/04/20 Abnormal pharmacologic myocardial perfusion stress test. There is a moderate in size, mild in severity, reversible defect involving the inferolateral myocardium that may reflect subtle ischemia but cannot rule out artifact. Left ventricular systolic function is low normal to mildly reduced by visual estimation and Siemens calculation (51%). QGS calculation of 35% is likely artificially low due to gating parameters. There is no significant coronary  artery calcification on the attenuation correction CT. Sensitivity and specificity of this study are degraded by body habitus. This is an intermediate risk study.  Carotid Doppler 01/19/20 Summary:  Right Carotid: There was no evidence of thrombus, dissection,  atherosclerotic plaque or stenosis in the cervical  carotid system.   Left Carotid: There was no evidence of thrombus, dissection,  atherosclerotic plaque or stenosis in the cervical carotid system.   Echo 01/19/20  1. Left ventricular ejection fraction, by estimation, is 55 to 60%. The  left ventricle has normal function. The left ventricle has no regional  wall motion abnormalities. Left ventricular diastolic parameters are  consistent with Grade I diastolic  dysfunction (impaired relaxation).   2. Right ventricular systolic function is normal. The right ventricular  size is normal.   3. Left atrial size was moderately dilated.   4. Mild mitral valve regurgitation.   EKG:  No EKG is ordered today.  The ekg independently reviewed 03/30/2020 demonstrated demonstrates SR 77 bpm with 1st degree AV block (PR 296). Noted Prolonged QTc and stable TWI in anterolateral leads.  Recent Labs: 11/17/2019: ALT 21 04/26/2020: BUN 15; Creatinine, Ser 0.98; Hemoglobin 16.0; Platelets 363; Potassium 3.7; Sodium 140  Recent Lipid Panel    Component Value Date/Time   CHOL 117 11/17/2019 1003   TRIG 119 11/17/2019 1003   HDL 31 (L) 11/17/2019 1003   CHOLHDL 3.8 11/17/2019 1003   LDLCALC 64 11/17/2019 1003    Home Medications   Current Meds  Medication Sig  . aspirin EC 81 MG tablet Take 1 tablet (81 mg total) by mouth daily. Swallow whole.  . celecoxib (CELEBREX) 100 MG capsule Take 100 mg by mouth daily.  . cetirizine (ZYRTEC) 10 MG tablet Take 1 tablet (10 mg total) by mouth daily.  . cloNIDine (CATAPRES - DOSED IN MG/24 HR) 0.1 mg/24hr patch Place 0.1 mg onto the skin once a week.  . fluticasone (FLONASE) 50 MCG/ACT nasal spray SPRAY 2 SPRAYS INTO EACH NOSTRIL EVERY DAY  . gabapentin (NEURONTIN) 600 MG tablet Take 600 mg by mouth 3 (three) times daily.  . isosorbide mononitrate (IMDUR) 30 MG 24 hr tablet Take 0.5 tablets (15 mg total) by mouth daily.  Marland Kitchen losartan (COZAAR) 100 MG tablet Take 1 tablet (100 mg total) by mouth daily.  . metoprolol  succinate (TOPROL-XL) 50 MG 24 hr tablet Take 0.5 tablets (25 mg total) by mouth daily. Take with or immediately following a meal.  . montelukast (SINGULAIR) 10 MG tablet Take 1 tablet (10 mg total) by mouth at bedtime.  Marland Kitchen venlafaxine XR (EFFEXOR-XR) 150 MG 24 hr capsule Take 1 capsule by mouth daily.  . [DISCONTINUED] gabapentin (NEURONTIN) 400 MG capsule Take 400 mg by mouth 3 (three) times daily.  . [DISCONTINUED] HYDROcodone-acetaminophen (NORCO) 10-325 MG tablet Take 1 tablet by mouth every 12 (twelve) hours as needed.   . [DISCONTINUED] meloxicam (MOBIC) 15 MG tablet Take 15 mg by mouth daily.  . [DISCONTINUED] traZODone (DESYREL) 50 MG tablet Take 1 tablet (50 mg total) by mouth at bedtime.     Review of Systems    All other systems reviewed and are otherwise negative except as noted above.  Physical Exam    VS:  BP (!) 142/100   Pulse 88   Ht 5' 8"  (1.727 m)   Wt (!) 307 lb (139.3 kg)   SpO2 96%   BMI 46.68 kg/m  , BMI Body mass index is 46.68 kg/m. GEN: Well nourished, overweight, well developed,  in no acute distress. HEENT: normal. Neck: Supple, no carotid bruits or masses. Cardiac: RRR, no murmurs, rubs, or gallops. No clubbing, cyanosis, edema.  Radials/PT 2+ and equal bilaterally.  Respiratory:  Respirations regular and unlabored, clear to auscultation bilaterally. GI: Soft, nontender, nondistended. MS: No deformity or atrophy. Skin: Warm and dry, no rash. Neuro:  Strength and sensation are intact. Psych: Normal affect.  Assessment & Plan   1. Atypical chest pain / DOE / Abnormal Lexiscan myoview - Lexiscan 01/2020 intermediate risk with moderate in size, mild in severity reversible inferolateral defect subtle ischemia vs artifact. Due to potential indication for antiarrythmic therapy in setting of symptomatic PACs, dyspnea on exertion, atypical chest pain - plan for cardiac catheterization. The patient understands that risks include but are not limited to stroke (1 in  1000), death (1 in 61), kidney failure [usually temporary] (1 in 500), bleeding (1 in 200), allergic reaction [possibly serious] (1 in 200), and agrees to proceed. Present GDMT includes aspirin, Imdur. Unable to further titrate Imdur due to lightheadedness. Lipid pane 11/17/19 with LDL 64, will defer addition of statin until results of cardiac cath. BMP, CBC today for pre-cath.   2. Palpitations -Reports continued palpitations which occur daily and are fleeting.  Known PAC by previous monitor.  We have been unable to previously uptitrate his Toprol due to first-degree AV block and fatigue.  Consider referral to EP but will complete cardiac catheterization first to allow guidance on which agents to utilize. Continue present dose of Toprol 57m daily.   3. HTN -BP mildly elevated today though routinely well controlled.  Will continue present antihypertensive regimen including clonidine 0.1 mg patch weekly, Imdur 50 mg daily, losartan 100 mg daily, Toprol 25 mg daily.  4. Morbid obesity - BMI >40.  Weight loss via diet and exercise encouraged  Disposition: Cardiac catheterization set up for 05/09/2020.  Follow up in 4 week(s) with Dr. ESaunders Revelor APP   CLoel Dubonnet NP 04/26/2020, 3:18 PM

## 2020-04-26 NOTE — Patient Instructions (Addendum)
Medication Instructions:  No medication changes today  *If you need a refill on your cardiac medications before your next appointment, please call your pharmacy*   Lab Work: Pre procedure lab work: - Your physician recommends that you have lab work today: BMP/ [place] entrance at [facility] - 1st desk on the right to check in, past the screening table  Pre procedure COVID swab: Friday 05/05/20 (8:00 am- 1:00 pm) - [facility] entrance, drive up test only  If you have labs (blood work) drawn today and your tests are completely normal, you will receive your results only by: Marland Kitchen MyChart Message (if you have MyChart) OR . A paper copy in the mail If you have any lab test that is abnormal or we need to change your treatment, we will call you to review the results.   Testing/Procedures: Your provider has requested that you have a cardiac catheterization. Cardiac catheterization is used to diagnose and/or treat various heart conditions. Doctors may recommend this procedure for a number of different reasons This procedure is also used to evaluate the valves, as well as measure the blood flow and oxygen levels in different parts of your heart. Please follow instruction sheet, as given.   You are scheduled for a Cardiac Catheterization on Tuesday, December 7 with Dr. Harrell Gave End.  1. Please arrive at the [place] at [facility] at 7:30 AM (This time is one hour before your procedure to ensure your preparation). Free valet parking service is available.   Special note: Every effort is made to have your procedure done on time. Please understand that emergencies sometimes delay scheduled procedures.  2. Diet: Do not eat solid foods after midnight.  You may have clear liquids until 5am upon the day of the procedure.  3. Labs: as above.  4. Medication instructions in preparation for your procedure:   Contrast Allergy: No   On the morning of your procedure, take your Aspirin and  any morning medicines NOT listed above.  You may use sips of water.  5. Plan for one night stay--bring personal belongings. 6. Bring a current list of your medications and current insurance cards. 7. You MUST have a responsible person to drive you home. 8. Someone MUST be with you the first 24 hours after you arrive home or your discharge will be delayed. 9. Please wear clothes that are easy to get on and off and wear slip-on shoes.  Thank you for allowing [place] to care for you!   -- [facility]   Follow-Up: At [facility], you and your health needs are our priority.  As part of our continuing mission to provide you with exceptional heart care, we have created designated Provider Care Teams.  These Care Teams include your primary Cardiologist (physician) and Advanced Practice Providers (APPs -  Physician Assistants and Nurse Practitioners) who all work together to provide you with the care you need, when you need it.  We recommend signing up for the patient portal called "MyChart".  Sign up information is provided on this After Visit Summary.  MyChart is used to connect with patients for Virtual Visits (Telemedicine).  Patients are able to view lab/test results, encounter notes, upcoming appointments, etc.  Non-urgent messages can be sent to your provider as well.   To learn more about what you can do with MyChart, go to [email or web address].    Your next appointment:   2 weeks after cardiac catheterization  The format for your  next appointment:   In Person  Provider:   You may see Nelva Bush, MD or one of the following Advanced Practice Providers on your designated Care Team:    Murray Hodgkins, NP  Christell Faith, PA-C  Marrianne Mood, PA-C  Cadence [place], [place]  Laurann Montana, NP   Other Instructions

## 2020-04-26 NOTE — Telephone Encounter (Signed)
Received records request FROM [employer],    Called patient .  Scanned to documents.    Forms placed at front desk pending patient signature and payment.

## 2020-05-03 NOTE — Telephone Encounter (Signed)
Patient states he has been out of work and is planning to be until after cleared from the cath.  I let him know Dr End would be completing and then we will be faxing accordingly.

## 2020-05-03 NOTE — Telephone Encounter (Signed)
No answer. Left message to call back.  Need to find out if patient has been out of work since last office visit so we can fill the forms out accordingly.  Is he planning to remain out of work until after the cath? Or if patient just needs filled out that he was here for office visit on 04/26/20.

## 2020-05-04 NOTE — Telephone Encounter (Signed)
Completed forms faxed to number provided with requested OV note   Patient notified via phone and originals mailed to patient confirmed address .

## 2020-05-05 ENCOUNTER — Telehealth: Payer: Self-pay | Admitting: Internal Medicine

## 2020-05-05 ENCOUNTER — Other Ambulatory Visit: Payer: Self-pay

## 2020-05-05 ENCOUNTER — Other Ambulatory Visit
Admission: RE | Admit: 2020-05-05 | Discharge: 2020-05-05 | Disposition: A | Discharge status: Home / Self Care | Payer: Medicare Other | Source: Ambulatory Visit | Attending: Internal Medicine | Admitting: Internal Medicine

## 2020-05-05 ENCOUNTER — Other Ambulatory Visit: Payer: Self-pay | Admitting: Physician Assistant

## 2020-05-05 DIAGNOSIS — Z01812 Encounter for preprocedural laboratory examination: Secondary | ICD-10-CM | POA: Diagnosis present

## 2020-05-05 DIAGNOSIS — Z20822 Contact with and (suspected) exposure to covid-19: Secondary | ICD-10-CM | POA: Diagnosis not present

## 2020-05-05 NOTE — Telephone Encounter (Signed)
Patient has upcoming procedure 12/7   He has pending pre op covid test but also mentions cough and sinus issues.   Please call to discuss results of covid test and if given sinus symptoms and cough ok to proceed with test.   Patient aware call back may be when results from testing available.

## 2020-05-05 NOTE — Telephone Encounter (Signed)
Medication Refill - Medication: Amoxicillin   Has the patient contacted their pharmacy? Yes.   Pt called stating that he is having symptoms of a sinus infection and is requesting to have this medication sent in. Please advise. (Agent: If no, request that the patient contact the pharmacy for the refill.) (Agent: If yes, when and what did the pharmacy advise?)  Preferred Pharmacy (with phone number or street name):  [id number] [place], [place] [id number] Phone: [phone]: [phone] Hours: Not open 24 hours     Agent: Please be advised that RX refills may take up to 3 business days. We ask that you follow-up with your pharmacy.

## 2020-05-05 NOTE — Telephone Encounter (Signed)
I do not send in medications for sinus infections without evaluation. Additionally, I did refer patient ENT and he would not be seen by them because he wouldn't get COVID tested. I would still advise he get seen by ENT due to the recurrent nature of this issue.

## 2020-05-05 NOTE — Telephone Encounter (Signed)
Patient was advised and said "okay" and hung up the phone.

## 2020-05-05 NOTE — Telephone Encounter (Signed)
COVID test results still pending.

## 2020-05-06 LAB — SARS CORONAVIRUS 2 (TAT 6-24 HRS): SARS Coronavirus 2: NEGATIVE

## 2020-05-08 ENCOUNTER — Encounter: Payer: Self-pay | Admitting: Emergency Medicine

## 2020-05-08 ENCOUNTER — Other Ambulatory Visit: Payer: Self-pay

## 2020-05-08 ENCOUNTER — Ambulatory Visit
Admission: EM | Admit: 2020-05-08 | Discharge: 2020-05-08 | Disposition: A | Discharge status: Home / Self Care | Payer: 59 | Attending: Family Medicine | Admitting: Family Medicine

## 2020-05-08 DIAGNOSIS — J0100 Acute maxillary sinusitis, unspecified: Secondary | ICD-10-CM

## 2020-05-08 MED ORDER — BENZONATATE 100 MG PO CAPS: 200.0000 mg | ORAL_CAPSULE | Freq: Three times a day (TID) | ORAL | 0 refills | Status: DC

## 2020-05-08 MED ORDER — AMOXICILLIN-POT CLAVULANATE 875-125 MG PO TABS: 1.0000 | ORAL_TABLET | Freq: Two times a day (BID) | ORAL | 0 refills | Status: DC

## 2020-05-08 NOTE — Telephone Encounter (Signed)
Patient's COVID test negative. The patient has been notified of the result and verbalized understanding.   States he has bad sinus congestion and cough. He is not sure if he should proceed with heart cath tomorrow. Advised him to reach out to PCP - he does not have one. Advised him to seek Urgent Care for evaluation of sinus congestion/cough to r/o sinus infection.  Routing to Dr End to advise on rescheduling of cath or not.

## 2020-05-08 NOTE — Discharge Instructions (Signed)
Continue the Flonase and zyrtec daily.  Start the antibiotics today.  Follow up as needed for continued or worsening symptoms

## 2020-05-08 NOTE — Telephone Encounter (Signed)
Discussed with Dr End. He said he would defer to the patient as to whether patient would like to post-pone the cath.  Spoke with patient and he feels he is coughing too bad and would like to post-pone. He would prefer to stay in [place] for procedure. He agreed to 05/2820 at 7:30 am with arrival of 6:30 am. He is aware to get COVID test the day before as early as possibly.  Called scheduling and preadmit testing.

## 2020-05-08 NOTE — ED Triage Notes (Addendum)
Patient c/o productive cough w/ "green mucus", nasal congestion, and sinus congestion since Thursday of last week.   Patient denies fever at home.   Patient endorses headache at home.   Patient has COVID-19 test on Friday w/ a negative result.   Patient has taken OTC cough and cold medication w/ no relief of symptoms.

## 2020-05-08 NOTE — ED Provider Notes (Signed)
Aaron [name]    CSN: [id number] Arrival date & time: 05/08/20  2563      History   Chief Complaint Chief Complaint  Patient presents with  . Cough  . Nasal Congestion    HPI Aaron [name] is a 54 y.o. male.   Patient is a 54 year old male presents today with productive cough with green mucus, nasal congestion, sinus pressure since last Thursday.  Symptoms have worsened over the past couple days.  He has had headaches.  Denies any fever, chills, body aches, chest pain or shortness of breath.  Headache negative Covid test on Friday.  Taking over-the-counter medicine without much relief.     Past Medical History:  Diagnosis Date  . Chronic back pain    disc  . Hypertension   . Kidney stone   . MDD (major depressive disorder)     Patient Active Problem List   Diagnosis Date Noted  . Palpitations 03/31/2020  . Abnormal EKG 03/31/2020  . Shortness of breath 03/31/2020  . Chest pain 12/17/2019  . Amaurosis fugax 12/17/2019  . MDD (major depressive disorder), single episode, moderate ([place]) 11/19/2018  . Bereavement 11/19/2018  . GAD (generalized anxiety disorder) 11/19/2018  . 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 ADD (attention deficit disorder) 09/02/2018  . Essential hypertension 09/02/2018  . Kidney stones 09/02/2018  . Murmur 09/02/2018  . Morbid obesity ([place]) 05/21/2018  . Morbid obesity with BMI of 45.0-49.9, adult ([place]) 09/20/2015    Past Surgical History:  Procedure Laterality Date  . KIDNEY STONE SURGERY    . VASECTOMY         Home Medications    Prior to Admission medications   Medication Sig Start Date End Date Taking? Authorizing Provider  aspirin EC 81 MG tablet Take 1 tablet (81 mg total) by mouth daily. Swallow whole. 12/17/19  Yes End, Harrell Gave, MD  celecoxib (CELEBREX) 100 MG capsule Take 100 mg by mouth daily. 04/20/20  Yes [provider]  cloNIDine (CATAPRES - DOSED IN MG/24 HR) 0.1 mg/24hr patch Place  0.1 mg onto the skin every Sunday.  04/20/20  Yes [provider]  gabapentin (NEURONTIN) 600 MG tablet Take 600 mg by mouth 3 (three) times daily. 04/20/20  Yes [provider]  isosorbide mononitrate (IMDUR) 30 MG 24 hr tablet Take 0.5 tablets (15 mg total) by mouth daily. 03/30/20 06/28/20 Yes End, Harrell Gave, MD  losartan (COZAAR) 100 MG tablet Take 1 tablet (100 mg total) by mouth daily. 11/19/19  Yes Carles Collet M, PA-C  metoprolol succinate (TOPROL-XL) 50 MG 24 hr tablet Take 0.5 tablets (25 mg total) by mouth daily. Take with or immediately following a meal. 03/30/20 06/28/20 Yes End, Harrell Gave, MD  acetaminophen (TYLENOL) 325 MG tablet Take 650-975 mg by mouth every 6 (six) hours as needed (for pain.).    [provider]  amoxicillin-clavulanate (AUGMENTIN) 875-125 MG tablet Take 1 tablet by mouth every 12 (twelve) hours. 05/08/20   Dniyah Grant, Tressia Miners A, NP  benzonatate (TESSALON) 100 MG capsule Take 2 capsules (200 mg total) by mouth every 8 (eight) hours. 05/08/20   Loura Halt A, NP  cetirizine (ZYRTEC) 10 MG tablet Take 1 tablet (10 mg total) by mouth daily. Patient taking differently: Take 10 mg by mouth daily as needed (allergies.).  02/10/20   Trinna Post, PA-C  fluticasone (FLONASE) 50 MCG/ACT nasal spray SPRAY 2 SPRAYS INTO EACH NOSTRIL EVERY DAY Patient taking differently: Place 2 sprays into both nostrils daily as  needed (allergies.).  03/14/20   Trinna Post, PA-C  ibuprofen (ADVIL) 200 MG tablet Take 600 mg by mouth every 8 (eight) hours as needed (for pain.).    [provider]  montelukast (SINGULAIR) 10 MG tablet Take 1 tablet (10 mg total) by mouth at bedtime. Patient taking differently: Take 10 mg by mouth at bedtime as needed (allergies.).  03/14/20   Trinna Post, PA-C    Family History Family History  Problem Relation Age of Onset  . COPD Mother   . Cancer Mother   . Hypertension Father   . Congestive Heart Failure  Father   . COPD Father   . Lung cancer Father   . Heart attack Father 36  . Hypertension Sister   . Diabetes Paternal Grandfather   . Heart attack Paternal Grandfather   . Healthy Son   . Healthy Daughter     Social History Social History   Tobacco Use  . Smoking status: Never Smoker  . Smokeless tobacco: Never Used  Vaping Use  . Vaping Use: Never used  Substance Use Topics  . Alcohol use: Not Currently  . Drug use: Never     Allergies   Patient has no known allergies.   Review of Systems Review of Systems   Physical Exam Triage Vital Signs ED Triage Vitals  Enc Vitals Group     BP 05/08/20 1001 (!) 163/99     Pulse Rate 05/08/20 1001 70     Resp 05/08/20 1001 19     Temp 05/08/20 1001 98 F (36.7 C)     Temp Source 05/08/20 1001 Oral     SpO2 05/08/20 1001 95 %     Weight 05/08/20 0959 295 lb (133.8 kg)     Height 05/08/20 0959 5\' 9"  (1.753 m)     Head Circumference --      Peak Flow --      Pain Score 05/08/20 0959 0     Pain Loc --      Pain Edu? --      Excl. in [place]? --    No data found.  Updated Vital Signs BP (!) 163/99 (BP Location: Left Arm)   Pulse 70   Temp 98 F (36.7 C) (Oral)   Resp 19   Ht 5\' 9"  (1.753 m)   Wt 295 lb (133.8 kg)   SpO2 95%   BMI 43.56 kg/m   Visual Acuity Right Eye Distance:   Left Eye Distance:   Bilateral Distance:    Right Eye Near:   Left Eye Near:    Bilateral Near:     Physical Exam Vitals and nursing note reviewed.  Constitutional:      General: He is not in acute distress.    Appearance: Normal appearance. He is not ill-appearing, toxic-appearing or diaphoretic.  HENT:     Head: Normocephalic and atraumatic.     Right Ear: Tympanic membrane and ear canal normal.     Left Ear: Tympanic membrane and ear canal normal.     Nose: Congestion present.     Mouth/Throat:     Pharynx: Oropharynx is clear.  Eyes:     Conjunctiva/sclera: Conjunctivae normal.  Cardiovascular:     Rate and Rhythm:  Normal rate and regular rhythm.     Pulses: Normal pulses.     Heart sounds: Normal heart sounds.  Pulmonary:     Effort: Pulmonary effort is normal.     Breath sounds: Normal breath sounds.  Musculoskeletal:        General: Normal range of motion.     Cervical back: Normal range of motion.  Skin:    General: Skin is warm and dry.  Neurological:     Mental Status: He is alert.  Psychiatric:        Mood and Affect: Mood normal.      UC Treatments / Results  Labs (all labs ordered are listed, but only abnormal results are displayed) Labs Reviewed - No data to display  EKG   Radiology No results found.  Procedures Procedures (including critical care time)  Medications Ordered in UC Medications - No data to display  Initial Impression / Assessment and Plan / UC Course  I have reviewed the triage vital signs and the nursing notes.  Pertinent labs & imaging results that were available during my care of the patient were reviewed by me and considered in my medical decision making (see chart for details).     Sinusitis We will go ahead and cover for sinus infection with worsening symptoms.  Patient is scheduled for heart catheterization tomorrow. Recommended continue Flonase and Zyrtec Follow up as needed for continued or worsening symptoms  Final Clinical Impressions(s) / UC Diagnoses   Final diagnoses:  Acute non-recurrent maxillary sinusitis     Discharge Instructions     Continue the Flonase and zyrtec daily.  Start the antibiotics today.  Follow up as needed for continued or worsening symptoms     ED Prescriptions    Medication Sig Dispense Auth. Provider   amoxicillin-clavulanate (AUGMENTIN) 875-125 MG tablet Take 1 tablet by mouth every 12 (twelve) hours. 14 tablet Shaquana Buel A, NP   benzonatate (TESSALON) 100 MG capsule Take 2 capsules (200 mg total) by mouth every 8 (eight) hours. 30 capsule Trinton Prewitt A, NP     PDMP not reviewed this encounter.    Orvan July, NP 05/08/20 1023

## 2020-05-09 DIAGNOSIS — R9439 Abnormal result of other cardiovascular function study: Secondary | ICD-10-CM

## 2020-05-10 ENCOUNTER — Other Ambulatory Visit: Payer: Self-pay

## 2020-05-10 ENCOUNTER — Emergency Department: Payer: Medicare Other

## 2020-05-10 DIAGNOSIS — U071 COVID-19: Principal | ICD-10-CM | POA: Diagnosis present

## 2020-05-10 DIAGNOSIS — Z825 Family history of asthma and other chronic lower respiratory diseases: Secondary | ICD-10-CM

## 2020-05-10 DIAGNOSIS — Z8249 Family history of ischemic heart disease and other diseases of the circulatory system: Secondary | ICD-10-CM

## 2020-05-10 DIAGNOSIS — I4891 Unspecified atrial fibrillation: Secondary | ICD-10-CM | POA: Diagnosis present

## 2020-05-10 DIAGNOSIS — Z6841 Body Mass Index (BMI) 40.0 and over, adult: Secondary | ICD-10-CM

## 2020-05-10 DIAGNOSIS — M545 Low back pain, unspecified: Secondary | ICD-10-CM | POA: Diagnosis present

## 2020-05-10 DIAGNOSIS — Z801 Family history of malignant neoplasm of trachea, bronchus and lung: Secondary | ICD-10-CM

## 2020-05-10 DIAGNOSIS — Z833 Family history of diabetes mellitus: Secondary | ICD-10-CM

## 2020-05-10 DIAGNOSIS — J9601 Acute respiratory failure with hypoxia: Secondary | ICD-10-CM | POA: Diagnosis present

## 2020-05-10 DIAGNOSIS — J1282 Pneumonia due to coronavirus disease 2019: Secondary | ICD-10-CM | POA: Diagnosis present

## 2020-05-10 DIAGNOSIS — F411 Generalized anxiety disorder: Secondary | ICD-10-CM | POA: Diagnosis present

## 2020-05-10 DIAGNOSIS — F329 Major depressive disorder, single episode, unspecified: Secondary | ICD-10-CM | POA: Diagnosis present

## 2020-05-10 DIAGNOSIS — Z791 Long term (current) use of non-steroidal anti-inflammatories (NSAID): Secondary | ICD-10-CM

## 2020-05-10 DIAGNOSIS — G8929 Other chronic pain: Secondary | ICD-10-CM | POA: Diagnosis present

## 2020-05-10 DIAGNOSIS — Z79899 Other long term (current) drug therapy: Secondary | ICD-10-CM

## 2020-05-10 DIAGNOSIS — Z7982 Long term (current) use of aspirin: Secondary | ICD-10-CM

## 2020-05-10 DIAGNOSIS — I10 Essential (primary) hypertension: Secondary | ICD-10-CM | POA: Diagnosis present

## 2020-05-10 DIAGNOSIS — Z87442 Personal history of urinary calculi: Secondary | ICD-10-CM

## 2020-05-10 LAB — CBC
HCT: 45.3 % (ref 39.0–52.0)
Hemoglobin: 15.1 g/dL (ref 13.0–17.0)
MCH: 30.4 pg (ref 26.0–34.0)
MCHC: 33.3 g/dL (ref 30.0–36.0)
MCV: 91.1 fL (ref 80.0–100.0)
Platelets: 250 10*3/uL (ref 150–400)
RBC: 4.97 MIL/uL (ref 4.22–5.81)
RDW: 12.6 % (ref 11.5–15.5)
WBC: 7.2 10*3/uL (ref 4.0–10.5)
nRBC: 0 % (ref 0.0–0.2)

## 2020-05-10 LAB — COMPREHENSIVE METABOLIC PANEL
ALT: 33 U/L (ref 0–44)
AST: 38 U/L (ref 15–41)
Albumin: 3.8 g/dL (ref 3.5–5.0)
Alkaline Phosphatase: 72 U/L (ref 38–126)
Anion gap: 10 (ref 5–15)
BUN: 10 mg/dL (ref 6–20)
CO2: 25 mmol/L (ref 22–32)
Calcium: 8.5 mg/dL — ABNORMAL LOW (ref 8.9–10.3)
Chloride: 103 mmol/L (ref 98–111)
Creatinine, Ser: 1.11 mg/dL (ref 0.61–1.24)
GFR, Estimated: >60 mL/min (ref >60)
Glucose, Bld: 108 mg/dL — ABNORMAL HIGH (ref 70–99)
Potassium: 3.2 mmol/L — ABNORMAL LOW (ref 3.5–5.1)
Sodium: 138 mmol/L (ref 135–145)
Total Bilirubin: 0.7 mg/dL (ref 0.3–1.2)
Total Protein: 7.9 g/dL (ref 6.5–8.1)

## 2020-05-10 LAB — RESP PANEL BY RT-PCR (FLU A&B, COVID) ARPGX2
Influenza A by PCR: NEGATIVE
Influenza B by PCR: NEGATIVE
SARS Coronavirus 2 by RT PCR: POSITIVE — AB

## 2020-05-10 LAB — TROPONIN I (HIGH SENSITIVITY)
Troponin I (High Sensitivity): 45 ng/L — ABNORMAL HIGH (ref <18)
Troponin I (High Sensitivity): 39 ng/L — ABNORMAL HIGH (ref <18)

## 2020-05-10 NOTE — ED Triage Notes (Signed)
Pt in with co cough, congestion, fever and chest tightness x 1 week. Started on antibiotics 2 days ago for sinus infection. Did have negative covid test Friday but daughter recently tested negative.

## 2020-05-11 ENCOUNTER — Emergency Department: Payer: Medicare Other

## 2020-05-11 ENCOUNTER — Encounter: Payer: Self-pay | Admitting: Radiology

## 2020-05-11 ENCOUNTER — Inpatient Hospital Stay
Admission: EM | Admit: 2020-05-11 | Discharge: 2020-05-12 | DRG: 177 | Disposition: A | Discharge status: Home / Self Care | Payer: Medicare Other | Attending: Hospitalist | Admitting: Hospitalist

## 2020-05-11 DIAGNOSIS — J329 Chronic sinusitis, unspecified: Secondary | ICD-10-CM

## 2020-05-11 DIAGNOSIS — Z6841 Body Mass Index (BMI) 40.0 and over, adult: Secondary | ICD-10-CM

## 2020-05-11 DIAGNOSIS — I10 Essential (primary) hypertension: Secondary | ICD-10-CM

## 2020-05-11 DIAGNOSIS — I4891 Unspecified atrial fibrillation: Secondary | ICD-10-CM | POA: Diagnosis present

## 2020-05-11 DIAGNOSIS — Z9109 Other allergy status, other than to drugs and biological substances: Secondary | ICD-10-CM

## 2020-05-11 DIAGNOSIS — U071 COVID-19: Secondary | ICD-10-CM | POA: Diagnosis present

## 2020-05-11 DIAGNOSIS — J9600 Acute respiratory failure, unspecified whether with hypoxia or hypercapnia: Secondary | ICD-10-CM | POA: Diagnosis not present

## 2020-05-11 DIAGNOSIS — J1282 Pneumonia due to coronavirus disease 2019: Secondary | ICD-10-CM | POA: Diagnosis present

## 2020-05-11 DIAGNOSIS — Z7982 Long term (current) use of aspirin: Secondary | ICD-10-CM | POA: Diagnosis not present

## 2020-05-11 DIAGNOSIS — G8929 Other chronic pain: Secondary | ICD-10-CM | POA: Diagnosis present

## 2020-05-11 DIAGNOSIS — R0981 Nasal congestion: Secondary | ICD-10-CM

## 2020-05-11 DIAGNOSIS — F329 Major depressive disorder, single episode, unspecified: Secondary | ICD-10-CM | POA: Diagnosis present

## 2020-05-11 DIAGNOSIS — Z8249 Family history of ischemic heart disease and other diseases of the circulatory system: Secondary | ICD-10-CM | POA: Diagnosis not present

## 2020-05-11 DIAGNOSIS — M545 Low back pain, unspecified: Secondary | ICD-10-CM | POA: Diagnosis present

## 2020-05-11 DIAGNOSIS — Z791 Long term (current) use of non-steroidal anti-inflammatories (NSAID): Secondary | ICD-10-CM | POA: Diagnosis not present

## 2020-05-11 DIAGNOSIS — J9601 Acute respiratory failure with hypoxia: Secondary | ICD-10-CM | POA: Diagnosis present

## 2020-05-11 DIAGNOSIS — Z825 Family history of asthma and other chronic lower respiratory diseases: Secondary | ICD-10-CM | POA: Diagnosis not present

## 2020-05-11 DIAGNOSIS — Z801 Family history of malignant neoplasm of trachea, bronchus and lung: Secondary | ICD-10-CM | POA: Diagnosis not present

## 2020-05-11 DIAGNOSIS — Z833 Family history of diabetes mellitus: Secondary | ICD-10-CM | POA: Diagnosis not present

## 2020-05-11 DIAGNOSIS — F411 Generalized anxiety disorder: Secondary | ICD-10-CM | POA: Diagnosis present

## 2020-05-11 DIAGNOSIS — Z79899 Other long term (current) drug therapy: Secondary | ICD-10-CM | POA: Diagnosis not present

## 2020-05-11 DIAGNOSIS — Z87442 Personal history of urinary calculi: Secondary | ICD-10-CM | POA: Diagnosis not present

## 2020-05-11 HISTORY — DX: Unspecified atrial fibrillation: I48.91

## 2020-05-11 HISTORY — DX: COVID-19: U07.1

## 2020-05-11 LAB — TRIGLYCERIDES: Triglycerides: 86 mg/dL (ref <150)

## 2020-05-11 LAB — FIBRINOGEN: Fibrinogen: 484 mg/dL — ABNORMAL HIGH (ref 210–475)

## 2020-05-11 LAB — C-REACTIVE PROTEIN: CRP: 4.1 mg/dL — ABNORMAL HIGH (ref <1.0)

## 2020-05-11 LAB — LACTATE DEHYDROGENASE: LDH: 144 U/L (ref 98–192)

## 2020-05-11 LAB — FERRITIN: Ferritin: 276 ng/mL (ref 24–336)

## 2020-05-11 LAB — LACTIC ACID, PLASMA: Lactic Acid, Venous: 1.1 mmol/L (ref 0.5–1.9)

## 2020-05-11 LAB — PROCALCITONIN: Procalcitonin: <0.1 ng/mL

## 2020-05-11 LAB — FIBRIN DERIVATIVES D-DIMER (ARMC ONLY): Fibrin derivatives D-dimer (ARMC): 612.03 ng/mL (FEU) — ABNORMAL HIGH (ref 0.00–499.00)

## 2020-05-11 MED ORDER — ACETAMINOPHEN 500 MG PO TABS
1000.0000 mg | ORAL_TABLET | Freq: Once | ORAL | Status: AC
  Administered 2020-05-11: 1000 mg via ORAL
  Filled 2020-05-11: qty 2

## 2020-05-11 MED ORDER — SODIUM CHLORIDE 0.9 % IV SOLN: 100.0000 mg | Freq: Every day | INTRAVENOUS | Status: DC

## 2020-05-11 MED ORDER — GABAPENTIN 600 MG PO TABS
600.0000 mg | ORAL_TABLET | Freq: Three times a day (TID) | ORAL | Status: DC
  Administered 2020-05-11: 23:00:00 600 mg via ORAL
  Filled 2020-05-11 (×3): qty 1

## 2020-05-11 MED ORDER — SODIUM CHLORIDE 0.9 % IV BOLUS
500.0000 mL | Freq: Once | INTRAVENOUS | Status: AC
  Administered 2020-05-11: 500 mL via INTRAVENOUS

## 2020-05-11 MED ORDER — ASPIRIN EC 81 MG PO TBEC
81.0000 mg | DELAYED_RELEASE_TABLET | Freq: Every day | ORAL | Status: DC
  Administered 2020-05-11 – 2020-05-12 (×2): 81 mg via ORAL
  Filled 2020-05-11 (×2): qty 1

## 2020-05-11 MED ORDER — ALUM & MAG HYDROXIDE-SIMETH 200-200-20 MG/5ML PO SUSP
30.0000 mL | ORAL | Status: DC | PRN
  Administered 2020-05-11: 30 mL via ORAL
  Filled 2020-05-11: qty 30

## 2020-05-11 MED ORDER — DEXAMETHASONE SODIUM PHOSPHATE 10 MG/ML IJ SOLN
6.0000 mg | Freq: Once | INTRAMUSCULAR | Status: AC
  Administered 2020-05-11: 6 mg via INTRAVENOUS
  Filled 2020-05-11: qty 1

## 2020-05-11 MED ORDER — METOPROLOL SUCCINATE ER 25 MG PO TB24
25.0000 mg | ORAL_TABLET | Freq: Every day | ORAL | Status: DC
  Administered 2020-05-11 – 2020-05-12 (×2): 25 mg via ORAL
  Filled 2020-05-11 (×2): qty 1

## 2020-05-11 MED ORDER — LORATADINE 10 MG PO TABS
10.0000 mg | ORAL_TABLET | Freq: Every day | ORAL | Status: DC
  Administered 2020-05-12: 08:00:00 10 mg via ORAL
  Filled 2020-05-11 (×2): qty 1

## 2020-05-11 MED ORDER — FLUTICASONE PROPIONATE 50 MCG/ACT NA SUSP
2.0000 | Freq: Every day | NASAL | Status: DC | PRN
  Filled 2020-05-11: qty 16

## 2020-05-11 MED ORDER — BENZONATATE 100 MG PO CAPS
200.0000 mg | ORAL_CAPSULE | Freq: Three times a day (TID) | ORAL | Status: DC
  Filled 2020-05-11 (×2): qty 2

## 2020-05-11 MED ORDER — DEXAMETHASONE 4 MG PO TABS
6.0000 mg | ORAL_TABLET | ORAL | Status: DC
  Administered 2020-05-12: 08:00:00 6 mg via ORAL
  Filled 2020-05-11: qty 2
  Filled 2020-05-11: qty 4

## 2020-05-11 MED ORDER — ALBUTEROL SULFATE HFA 108 (90 BASE) MCG/ACT IN AERS
2.0000 | INHALATION_SPRAY | Freq: Four times a day (QID) | RESPIRATORY_TRACT | Status: DC
  Administered 2020-05-11 – 2020-05-12 (×4): 2 via RESPIRATORY_TRACT
  Filled 2020-05-11: qty 6.7

## 2020-05-11 MED ORDER — ONDANSETRON HCL 4 MG PO TABS: 4.0000 mg | ORAL_TABLET | Freq: Four times a day (QID) | ORAL | Status: DC | PRN

## 2020-05-11 MED ORDER — SODIUM CHLORIDE 0.9% FLUSH
3.0000 mL | Freq: Two times a day (BID) | INTRAVENOUS | Status: DC
  Administered 2020-05-11 – 2020-05-12 (×3): 3 mL via INTRAVENOUS

## 2020-05-11 MED ORDER — HYDROCOD POLST-CPM POLST ER 10-8 MG/5ML PO SUER
5.0000 mL | Freq: Two times a day (BID) | ORAL | Status: DC | PRN
  Administered 2020-05-11 – 2020-05-12 (×2): 5 mL via ORAL
  Filled 2020-05-11 (×2): qty 5

## 2020-05-11 MED ORDER — ZINC SULFATE 220 (50 ZN) MG PO CAPS
220.0000 mg | ORAL_CAPSULE | Freq: Every day | ORAL | Status: DC
  Administered 2020-05-11 – 2020-05-12 (×2): 220 mg via ORAL
  Filled 2020-05-11 (×2): qty 1

## 2020-05-11 MED ORDER — ONDANSETRON HCL 4 MG/2ML IJ SOLN: 4.0000 mg | Freq: Four times a day (QID) | INTRAMUSCULAR | Status: DC | PRN

## 2020-05-11 MED ORDER — SODIUM CHLORIDE 0.9 % IV SOLN
100.0000 mg | Freq: Every day | INTRAVENOUS | Status: DC
  Administered 2020-05-12: 09:00:00 100 mg via INTRAVENOUS
  Filled 2020-05-11 (×2): qty 20

## 2020-05-11 MED ORDER — SODIUM CHLORIDE 0.9% FLUSH: 3.0000 mL | INTRAVENOUS | Status: DC | PRN

## 2020-05-11 MED ORDER — ALBUTEROL SULFATE HFA 108 (90 BASE) MCG/ACT IN AERS
2.0000 | INHALATION_SPRAY | Freq: Once | RESPIRATORY_TRACT | Status: AC
  Administered 2020-05-11: 2 via RESPIRATORY_TRACT
  Filled 2020-05-11: qty 6.7

## 2020-05-11 MED ORDER — MONTELUKAST SODIUM 10 MG PO TABS
10.0000 mg | ORAL_TABLET | Freq: Every day | ORAL | Status: DC
  Administered 2020-05-11: 10 mg via ORAL
  Filled 2020-05-11 (×2): qty 1

## 2020-05-11 MED ORDER — LOSARTAN POTASSIUM 50 MG PO TABS
100.0000 mg | ORAL_TABLET | Freq: Every day | ORAL | Status: DC
  Administered 2020-05-11 – 2020-05-12 (×2): 100 mg via ORAL
  Filled 2020-05-11 (×2): qty 2

## 2020-05-11 MED ORDER — ACETAMINOPHEN 325 MG PO TABS: 650.0000 mg | ORAL_TABLET | Freq: Four times a day (QID) | ORAL | Status: DC | PRN

## 2020-05-11 MED ORDER — SODIUM CHLORIDE 0.9 % IV SOLN
200.0000 mg | Freq: Once | INTRAVENOUS | Status: AC
  Administered 2020-05-11: 200 mg via INTRAVENOUS
  Filled 2020-05-11 (×2): qty 40

## 2020-05-11 MED ORDER — ISOSORBIDE MONONITRATE ER 30 MG PO TB24
15.0000 mg | ORAL_TABLET | Freq: Every day | ORAL | Status: DC
  Administered 2020-05-11 – 2020-05-12 (×2): 15 mg via ORAL
  Filled 2020-05-11 (×3): qty 1

## 2020-05-11 MED ORDER — ENOXAPARIN SODIUM 40 MG/0.4ML ~~LOC~~ SOLN
40.0000 mg | SUBCUTANEOUS | Status: DC
  Administered 2020-05-11 – 2020-05-12 (×2): 40 mg via SUBCUTANEOUS
  Filled 2020-05-11 (×2): qty 0.4

## 2020-05-11 MED ORDER — SODIUM CHLORIDE 0.9 % IV SOLN: 250.0000 mL | INTRAVENOUS | Status: DC | PRN

## 2020-05-11 MED ORDER — IOHEXOL 350 MG/ML SOLN
75.0000 mL | Freq: Once | INTRAVENOUS | Status: AC | PRN
  Administered 2020-05-11: 75 mL via INTRAVENOUS

## 2020-05-11 MED ORDER — SODIUM CHLORIDE 0.9 % IV SOLN: 200.0000 mg | Freq: Once | INTRAVENOUS | Status: DC

## 2020-05-11 MED ORDER — CLONIDINE HCL 0.1 MG/24HR TD PTWK: 0.1000 mg | MEDICATED_PATCH | TRANSDERMAL | Status: DC

## 2020-05-11 MED ORDER — ASCORBIC ACID 500 MG PO TABS
500.0000 mg | ORAL_TABLET | Freq: Every day | ORAL | Status: DC
  Administered 2020-05-11 – 2020-05-12 (×2): 500 mg via ORAL
  Filled 2020-05-11 (×2): qty 1

## 2020-05-11 NOTE — Consult Note (Signed)
Remdesivir - Pharmacy Brief Note   O:  ALT: 33 CXR: Lungs clear per imaging SpO2: 95% on RA, 88% while ambulating   A/P:  Remdesivir 200 mg IVPB once followed by 100 mg IVPB daily x 4 days.   Lu Duffel, PharmD, BCPS Clinical Pharmacist 05/11/2020 7:11 AM

## 2020-05-11 NOTE — ED Notes (Signed)
Pharmacy to send up imdur when ready.

## 2020-05-11 NOTE — ED Notes (Signed)
Pt to CT at this time.

## 2020-05-11 NOTE — ED Notes (Signed)
Pharmacy to send up pt's remdesivir when ready.

## 2020-05-11 NOTE — ED Notes (Signed)
Pt given cola at this time.

## 2020-05-11 NOTE — ED Notes (Signed)
Pt back from CT

## 2020-05-11 NOTE — ED Notes (Addendum)
Pt NAD at this time

## 2020-05-11 NOTE — Progress Notes (Signed)
Remdesivir - Pharmacy Brief Note   O: SARS CoV-2: positive ALT: 33 U/L CXR: No active cardiopulmonary disease CT angiogram shows patchy bilateral opacities consistent with Covid pneumonia SpO2: 94% on RA   A/P:  Remdesivir 200 mg IVPB once followed by 100 mg IVPB daily x 4 days.   Vallery Sa, PharmD 05/11/2020 10:07 AM

## 2020-05-11 NOTE — ED Notes (Signed)
Pt ambulated around room. Initial O2 sat was 96%. Pt dropped to 88% within first 30 seconds of walking and remained at 88% until patient returned to bed. O2 sats returned to 95% upon resting.

## 2020-05-11 NOTE — ED Notes (Signed)
Pt asking to eat. Admit MD messaged to see if pt can eat now. Will wait for response and inform pt.

## 2020-05-11 NOTE — ED Notes (Signed)
Admitting MD Agbata at bedside at this time

## 2020-05-11 NOTE — H&P (Signed)
History and Physical    Aaron [name]:[id number] DOB: 08-05-1965 DOA: 05/11/2020  PCP: Trinna Post, PA-C   Patient coming from: Home  I have personally briefly reviewed patient's old medical records in [place]  Chief Complaint: Shortness of breath  HPI: Aaron [name] is a 54 y.o. male with medical history significant for morbid obesity, chronic low back pain, hypertension and depression who presents to the emergency room for evaluation of feeling unwell.  Patient states that about a week ago he developed a cough, nasal congestion, fever and chest tightness which he attributed to a sinus infection.  2 days prior to his hospitalization he was started on antibiotics for sinus infection with no improvement in his symptoms.  His daughter recently tested positive for the COVID-19 virus.  Patient received 1 dose of the moderna vaccine about 2 months ago and did not complete the vaccination series.  His COVID-19 PCR test is positive.  At rest patient has room air pulse oximetry in the 90s but with exertion pulse oximetry drops to 88%. He has poor oral intake, shortness of breath with exertion, fever, chills but denies having any abdominal pain, no changes in his bowel habits, no urinary symptoms, no headache or chest pain. Labs show sodium 138, potassium 3.2, chloride 103, bicarb 25, glucose 108, BUN 10, creatinine 1.1, calcium 8.5, alkaline phosphatase 72, albumin 3.8, AST 38, ALT 33, LDH 144, troponin 39, triglycerides 86, ferritin 276, lactic acid 1.1, procalcitonin less than 0.10, white count 7.2, hemoglobin 15.1, hematocrit 45.3, MCV 91, RDW 12.6, platelet count 260, fibrinogen 484, fibrin derivatives 612 Patient's coronavirus PCR test is positive CT angiogram of the chest shows mild patchy bilateral pneumonia consistent with history of COVID-19.  Negative for pulmonary embolism Twelve-lead EKG reviewed by me shows atrial fibrillation with ST and T wave changes involving the  anterior lateral leads   ED Course: Patient is a 54 year old Caucasian male who presents to the emergency room for evaluation of shortness of breath, cough, fever and chills.  He was exposed to his daughter who recently tested positive for the COVID-19 virus.  Patient received 1 dose of Moderna vaccine about 2 months ago.  CT angiogram of the chest is consistent with Covid pneumonia and patient is noted to be hypoxic with exertion with pulse oximetry of 88%.  He will be admitted to the hospital for further evaluation.  Review of Systems: As per HPI otherwise 10 point review of systems negative.    Past Medical History:  Diagnosis Date  . A-fib ([place])   . Chronic back pain    disc  . Hypertension   . Kidney stone   . MDD (major depressive disorder)     Past Surgical History:  Procedure Laterality Date  . KIDNEY STONE SURGERY    . VASECTOMY       reports that he has never smoked. He has never used smokeless tobacco. He reports previous alcohol use. He reports that he does not use drugs.  No Known Allergies  Family History  Problem Relation Age of Onset  . COPD Mother   . Cancer Mother   . Hypertension Father   . Congestive Heart Failure Father   . COPD Father   . Lung cancer Father   . Heart attack Father 74  . Hypertension Sister   . Diabetes Paternal Grandfather   . Heart attack Paternal Grandfather   . Healthy Son   . Healthy Daughter  Prior to Admission medications   Medication Sig Start Date End Date Taking? Authorizing Provider  acetaminophen (TYLENOL) 325 MG tablet Take 650-975 mg by mouth every 6 (six) hours as needed (for pain.).   Yes [provider]  amoxicillin-clavulanate (AUGMENTIN) 875-125 MG tablet Take 1 tablet by mouth every 12 (twelve) hours. 05/08/20  Yes Bast, Traci A, NP  aspirin EC 81 MG tablet Take 1 tablet (81 mg total) by mouth daily. Swallow whole. 12/17/19  Yes End, Harrell Gave, MD  benzonatate (TESSALON) 100 MG capsule Take 2  capsules (200 mg total) by mouth every 8 (eight) hours. 05/08/20  Yes Bast, Traci A, NP  celecoxib (CELEBREX) 100 MG capsule Take 100 mg by mouth daily. 04/20/20  Yes [provider]  cetirizine (ZYRTEC) 10 MG tablet Take 1 tablet (10 mg total) by mouth daily. Patient taking differently: Take 10 mg by mouth daily as needed (allergies.). 02/10/20  Yes Carles Collet M, PA-C  cloNIDine (CATAPRES - DOSED IN MG/24 HR) 0.1 mg/24hr patch Place 0.1 mg onto the skin every Sunday.  04/20/20  Yes [provider]  fluticasone (FLONASE) 50 MCG/ACT nasal spray SPRAY 2 SPRAYS INTO EACH NOSTRIL EVERY DAY Patient taking differently: Place 2 sprays into both nostrils daily as needed (allergies.). 03/14/20  Yes Trinna Post, PA-C  gabapentin (NEURONTIN) 600 MG tablet Take 600 mg by mouth 3 (three) times daily. 04/20/20  Yes [provider]  ibuprofen (ADVIL) 200 MG tablet Take 600 mg by mouth every 8 (eight) hours as needed (for pain.).   Yes [provider]  isosorbide mononitrate (IMDUR) 30 MG 24 hr tablet Take 0.5 tablets (15 mg total) by mouth daily. 03/30/20 06/28/20 Yes End, Harrell Gave, MD  losartan (COZAAR) 100 MG tablet Take 1 tablet (100 mg total) by mouth daily. 11/19/19  Yes Carles Collet M, PA-C  metoprolol succinate (TOPROL-XL) 50 MG 24 hr tablet Take 0.5 tablets (25 mg total) by mouth daily. Take with or immediately following a meal. 03/30/20 06/28/20 Yes End, Harrell Gave, MD  montelukast (SINGULAIR) 10 MG tablet Take 1 tablet (10 mg total) by mouth at bedtime. Patient taking differently: Take 10 mg by mouth at bedtime as needed (allergies.). 03/14/20  Yes Trinna Post, PA-C    Physical Exam: Vitals:   05/11/20 0713 05/11/20 0743 05/11/20 0805 05/11/20 0900  BP:   123/86 134/85  Pulse:   92 84  Resp:   16 20  Temp:      TempSrc:      SpO2: 94%  94% 94%  Weight:  136.1 kg    Height:  5' 9"  (1.753 m)       Vitals:   05/11/20 0713 05/11/20 0743  05/11/20 0805 05/11/20 0900  BP:   123/86 134/85  Pulse:   92 84  Resp:   16 20  Temp:      TempSrc:      SpO2: 94%  94% 94%  Weight:  136.1 kg    Height:  5' 9"  (1.753 m)      Constitutional: NAD, alert and oriented x 3.  Appears comfortable and in no distress Eyes: PERRL, lids and conjunctivae normal ENMT: Mucous membranes are moist.  Neck: normal, supple, no masses, no thyromegaly Respiratory: Bilateral air entry, no wheezing, no crackles. Normal respiratory effort. No accessory muscle use.  Cardiovascular: Irregularly irregular, no murmurs / rubs / gallops. No extremity edema. 2+ pedal pulses. No carotid bruits.  Abdomen: no tenderness, no masses palpated. No hepatosplenomegaly. Bowel sounds  positive.  Central adiposity Musculoskeletal: no clubbing / cyanosis. No joint deformity upper and lower extremities.  Skin: no rashes, lesions, ulcers.  Neurologic: No gross focal neurologic deficit. Psychiatric: Normal mood and affect.   Labs on Admission: I have personally reviewed following labs and imaging studies  CBC: Recent Labs  Lab 05/10/20 2008  WBC 7.2  HGB 15.1  HCT 45.3  MCV 91.1  PLT 383   Basic Metabolic Panel: Recent Labs  Lab 05/10/20 2008  NA 138  K 3.2*  CL 103  CO2 25  GLUCOSE 108*  BUN 10  CREATININE 1.11  CALCIUM 8.5*   GFR: Estimated Creatinine Clearance: 105.5 mL/min (by C-G formula based on SCr of 1.11 mg/dL). Liver Function Tests: Recent Labs  Lab 05/10/20 2008  AST 38  ALT 33  ALKPHOS 72  BILITOT 0.7  PROT 7.9  ALBUMIN 3.8   No results for input(s): LIPASE, AMYLASE in the last 168 hours. No results for input(s): AMMONIA in the last 168 hours. Coagulation Profile: No results for input(s): INR, PROTIME in the last 168 hours. Cardiac Enzymes: No results for input(s): CKTOTAL, CKMB, CKMBINDEX, TROPONINI in the last 168 hours. BNP (last 3 results) No results for input(s): PROBNP in the last 8760 hours. HbA1C: No results for  input(s): HGBA1C in the last 72 hours. CBG: No results for input(s): GLUCAP in the last 168 hours. Lipid Profile: Recent Labs    05/11/20 0757  TRIG 86   Thyroid Function Tests: No results for input(s): TSH, T4TOTAL, FREET4, T3FREE, THYROIDAB in the last 72 hours. Anemia Panel: Recent Labs    05/11/20 0757  FERRITIN 276   Urine analysis:    Component Value Date/Time   COLORURINE Yellow 03/03/2013 1158   APPEARANCEUR Clear 03/03/2013 1158   LABSPEC 1.017 03/03/2013 1158   PHURINE 5.0 03/03/2013 1158   GLUCOSEU Negative 03/03/2013 1158   HGBUR 2+ 03/03/2013 1158   BILIRUBINUR small 10/19/2018 1409   BILIRUBINUR Negative 03/03/2013 1158   KETONESUR Negative 03/03/2013 1158   PROTEINUR Positive (A) 10/19/2018 1409   PROTEINUR Negative 03/03/2013 1158   UROBILINOGEN 0.2 10/19/2018 1409   NITRITE negative 10/19/2018 1409   NITRITE Negative 03/03/2013 1158   LEUKOCYTESUR Negative 10/19/2018 1409   LEUKOCYTESUR Negative 03/03/2013 1158    Radiological Exams on Admission: DG Chest 2 View  Result Date: 05/10/2020 CLINICAL DATA:  Chest pain. EXAM: CHEST - 2 VIEW COMPARISON:  October 01, 2011. FINDINGS: Borderline enlargement the cardiac silhouette. Both lungs are clear. No visible pleural effusions or pneumothorax. No acute osseous abnormality. Degenerative changes of the thoracic spine. IMPRESSION: No active cardiopulmonary disease. Electronically Signed   By: Margaretha Sheffield MD   On: 05/10/2020 20:22   CT Angio Chest PE W and/or Wo Contrast  Result Date: 05/11/2020 CLINICAL DATA:  High probability for pulmonary embolism. Progressively worsening shortness of breath for 4 days EXAM: CT ANGIOGRAPHY CHEST WITH CONTRAST TECHNIQUE: Multidetector CT imaging of the chest was performed using the standard protocol during bolus administration of intravenous contrast. Multiplanar CT image reconstructions and MIPs were obtained to evaluate the vascular anatomy. CONTRAST:  79m OMNIPAQUE  IOHEXOL 350 MG/ML SOLN COMPARISON:  None. FINDINGS: Cardiovascular: Satisfactory opacification of the pulmonary arteries to the segmental level. No evidence of pulmonary embolism. Normal heart size. No pericardial effusion. Coronary atherosclerotic calcifications seen along the LAD. Mediastinum/Nodes: Negative for adenopathy or mass Lungs/Pleura: Mild patchy ground-glass opacity in the bilateral lungs. Borderline airway thickening which is generalized. No edema, effusion, or pneumothorax Upper  Abdomen: No acute finding Musculoskeletal: Generalized spondylosis. T4-5 non segmentation. No acute or aggressive finding. Review of the MIP images confirms the above findings. IMPRESSION: 1. Mild, patchy bilateral pneumonia correlating with history of COVID-19. 2. Negative for pulmonary embolism. 3. Coronary atherosclerotic calcification. Electronically Signed   By: Monte Fantasia M.D.   On: 05/11/2020 07:52    EKG: Independently reviewed.  Atrial fibrillation with ST-T wave changes involving the anterolateral leads  Assessment/Plan Principal Problem:   Pneumonia due to COVID-19 virus Active Problems:   Essential hypertension   Morbid obesity with BMI of 45.0-49.9, adult (HCC)   GAD (generalized anxiety disorder)   Unspecified atrial fibrillation (HCC)   Acute respiratory failure due to COVID-19 [facility])     Pneumonia due to COVID-19 virus with acute respiratory failure Patient presents for evaluation of shortness of breath and cough and tested positive for COVID-19 virus His symptoms started about a week prior to presenting to the emergency room and he had a sick contact Patient received 1 dose of the Moderna vaccine about 2 months ago At rest patient has room air pulse oximetry in the low 90s but with exertion his pulse oximetry dropped to 88% CT angiogram shows patchy bilateral opacities consistent with Covid pneumonia We will start patient on remdesivir per protocol Start patient on Decadron 6 mg  p.o. daily Supportive care with antitussives and bronchodilator therapy    Morbid obesity (BMI 44) Complicates overall prognosis and care   Atrial fibrillation Patient has a CHADS2VASC score of 1 and is on aspirin as primary prophylaxis for an acute CVA Continue Toprol for rate control    Hypertension Continue clonidine, Cozaar, Toprol and nitrates    DVT prophylaxis: Lovenox Code Status: Full code Family Communication: Greater than 50% of time was spent discussing plan of care with patient at the bedside.  All questions and concerns have been addressed.  He verbalizes understanding and is with the plan. Disposition Plan: Back to previous home environment Consults called: None    Joyous Gleghorn MD [facility]     05/11/2020, 10:00 AM

## 2020-05-11 NOTE — ED Notes (Signed)
Pt given breakfast tray at this time.

## 2020-05-11 NOTE — ED Provider Notes (Signed)
[facility] Emergency Department Provider Note    Event Date/Time   First MD Initiated Contact with Patient 05/11/20 [id number]     (approximate)  I have reviewed the triage vital signs and the nursing notes.   HISTORY  Chief Complaint Nasal Congestion, Fever, and Chest Pain    HPI Aaron [name] is a 54 y.o. male the below listed past medical history presents to the ER for evaluation of 4 days of progressively worsening shortness of breath productive cough congestion malaise and headaches.  Also having daily fevers.  Daughter tested positive for Covid on Friday.  He has had 2 - tests thus far.  Does have a history of hypertension as well as obesity no history of diabetes.  Has had decreased p.o. intake.  No vomiting.    Past Medical History:  Diagnosis Date  . Chronic back pain    disc  . Hypertension   . Kidney stone   . MDD (major depressive disorder)    Family History  Problem Relation Age of Onset  . COPD Mother   . Cancer Mother   . Hypertension Father   . Congestive Heart Failure Father   . COPD Father   . Lung cancer Father   . Heart attack Father 61  . Hypertension Sister   . Diabetes Paternal Grandfather   . Heart attack Paternal Grandfather   . Healthy Son   . Healthy Daughter    Past Surgical History:  Procedure Laterality Date  . KIDNEY STONE SURGERY    . VASECTOMY     Patient Active Problem List   Diagnosis Date Noted  . Palpitations 03/31/2020  . Abnormal EKG 03/31/2020  . Shortness of breath 03/31/2020  . Chest pain 12/17/2019  . Amaurosis fugax 12/17/2019  . MDD (major depressive disorder), single episode, moderate ([place]) 11/19/2018  . Bereavement 11/19/2018  . GAD (generalized anxiety disorder) 11/19/2018  . 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 ADD (attention deficit disorder) 09/02/2018  . Essential hypertension 09/02/2018  . Kidney stones 09/02/2018  . Murmur 09/02/2018  . Morbid obesity ([place])  05/21/2018  . Morbid obesity with BMI of 45.0-49.9, adult ([place]) 09/20/2015      Prior to Admission medications   Medication Sig Start Date End Date Taking? Authorizing Provider  acetaminophen (TYLENOL) 325 MG tablet Take 650-975 mg by mouth every 6 (six) hours as needed (for pain.).   Yes [provider]  amoxicillin-clavulanate (AUGMENTIN) 875-125 MG tablet Take 1 tablet by mouth every 12 (twelve) hours. 05/08/20  Yes Bast, Traci A, NP  aspirin EC 81 MG tablet Take 1 tablet (81 mg total) by mouth daily. Swallow whole. 12/17/19  Yes End, Harrell Gave, MD  benzonatate (TESSALON) 100 MG capsule Take 2 capsules (200 mg total) by mouth every 8 (eight) hours. 05/08/20  Yes Bast, Traci A, NP  celecoxib (CELEBREX) 100 MG capsule Take 100 mg by mouth daily. 04/20/20  Yes [provider]  cetirizine (ZYRTEC) 10 MG tablet Take 1 tablet (10 mg total) by mouth daily. Patient taking differently: Take 10 mg by mouth daily as needed (allergies.). 02/10/20  Yes Carles Collet M, PA-C  cloNIDine (CATAPRES - DOSED IN MG/24 HR) 0.1 mg/24hr patch Place 0.1 mg onto the skin every Sunday.  04/20/20  Yes [provider]  fluticasone (FLONASE) 50 MCG/ACT nasal spray SPRAY 2 SPRAYS INTO EACH NOSTRIL EVERY DAY Patient taking differently: Place 2 sprays into both nostrils daily as needed (allergies.). 03/14/20  Yes Pollak,  Wendee Beavers, PA-C  gabapentin (NEURONTIN) 600 MG tablet Take 600 mg by mouth 3 (three) times daily. 04/20/20  Yes [provider]  ibuprofen (ADVIL) 200 MG tablet Take 600 mg by mouth every 8 (eight) hours as needed (for pain.).   Yes [provider]  isosorbide mononitrate (IMDUR) 30 MG 24 hr tablet Take 0.5 tablets (15 mg total) by mouth daily. 03/30/20 06/28/20 Yes End, Harrell Gave, MD  losartan (COZAAR) 100 MG tablet Take 1 tablet (100 mg total) by mouth daily. 11/19/19  Yes Carles Collet M, PA-C  metoprolol succinate (TOPROL-XL) 50 MG 24 hr tablet Take 0.5  tablets (25 mg total) by mouth daily. Take with or immediately following a meal. 03/30/20 06/28/20 Yes End, Harrell Gave, MD  montelukast (SINGULAIR) 10 MG tablet Take 1 tablet (10 mg total) by mouth at bedtime. Patient taking differently: Take 10 mg by mouth at bedtime as needed (allergies.). 03/14/20  Yes Trinna Post, PA-C    Allergies Patient has no known allergies.    Social History Social History   Tobacco Use  . Smoking status: Never Smoker  . Smokeless tobacco: Never Used  Vaping Use  . Vaping Use: Never used  Substance Use Topics  . Alcohol use: Not Currently  . Drug use: Never    Review of Systems Patient denies headaches, rhinorrhea, blurry vision, numbness, shortness of breath, chest pain, edema, cough, abdominal pain, nausea, vomiting, diarrhea, dysuria, fevers, rashes or hallucinations unless otherwise stated above in HPI. ____________________________________________   PHYSICAL EXAM:  VITAL SIGNS: Vitals:   05/11/20 0630 05/11/20 0713  BP: (!) 148/90   Pulse: 75   Resp: 15   Temp:    SpO2: 95% 94%    Constitutional: Alert and oriented.  Eyes: Conjunctivae are normal.  Head: Atraumatic. Nose: No congestion/rhinnorhea. Mouth/Throat: Mucous membranes are moist.   Neck: No stridor. Painless ROM.  Cardiovascular: Normal rate, regular rhythm. Grossly normal heart sounds.  Good peripheral circulation. Respiratory: Mild tachypnea no retractions.  Coarse breath sounds throughout Gastrointestinal: Soft and nontender. No distention. No abdominal bruits. No CVA tenderness. Genitourinary:  Musculoskeletal: No lower extremity tenderness nor edema.  No joint effusions. Neurologic:  Normal speech and language. No gross focal neurologic deficits are appreciated. No facial droop Skin:  Skin is warm, dry and intact. No rash noted. Psychiatric: Mood and affect are normal. Speech and behavior are normal.  ____________________________________________   LABS (all  labs ordered are listed, but only abnormal results are displayed)  Results for orders placed or performed during the hospital encounter of 05/11/20 (from the past 24 hour(s))  CBC     Status: None   Collection Time: 05/10/20  8:08 PM  Result Value Ref Range   WBC 7.2 4.0 - 10.5 K/uL   RBC 4.97 4.22 - 5.81 MIL/uL   Hemoglobin 15.1 13.0 - 17.0 g/dL   HCT 45.3 39.0 - 52.0 %   MCV 91.1 80.0 - 100.0 fL   MCH 30.4 26.0 - 34.0 pg   MCHC 33.3 30.0 - 36.0 g/dL   RDW 12.6 11.5 - 15.5 %   Platelets 250 150 - 400 K/uL   nRBC 0.0 0.0 - 0.2 %  Comprehensive metabolic panel     Status: Abnormal   Collection Time: 05/10/20  8:08 PM  Result Value Ref Range   Sodium 138 135 - 145 mmol/L   Potassium 3.2 (L) 3.5 - 5.1 mmol/L   Chloride 103 98 - 111 mmol/L   CO2 25 22 - 32 mmol/L  Glucose, Bld 108 (H) 70 - 99 mg/dL   BUN 10 6 - 20 mg/dL   Creatinine, Ser 1.11 0.61 - 1.24 mg/dL   Calcium 8.5 (L) 8.9 - 10.3 mg/dL   Total Protein 7.9 6.5 - 8.1 g/dL   Albumin 3.8 3.5 - 5.0 g/dL   AST 38 15 - 41 U/L   ALT 33 0 - 44 U/L   Alkaline Phosphatase 72 38 - 126 U/L   Total Bilirubin 0.7 0.3 - 1.2 mg/dL   GFR, Estimated >60 >60 mL/min   Anion gap 10 5 - 15  Troponin I (High Sensitivity)     Status: Abnormal   Collection Time: 05/10/20  8:08 PM  Result Value Ref Range   Troponin I (High Sensitivity) 45 (H) <18 ng/L  Resp Panel by RT-PCR (Flu A&B, Covid) Nasopharyngeal Swab     Status: Abnormal   Collection Time: 05/10/20  8:09 PM   Specimen: Nasopharyngeal Swab; Nasopharyngeal(NP) swabs in vial transport medium  Result Value Ref Range   SARS Coronavirus 2 by RT PCR POSITIVE (A) NEGATIVE   Influenza A by PCR NEGATIVE NEGATIVE   Influenza B by PCR NEGATIVE NEGATIVE  Troponin I (High Sensitivity)     Status: Abnormal   Collection Time: 05/10/20 10:32 PM  Result Value Ref Range   Troponin I (High Sensitivity) 39 (H) <18 ng/L  Fibrin derivatives D-Dimer ([facility] only)     Status: Abnormal   Collection Time:  05/11/20  5:52 AM  Result Value Ref Range   Fibrin derivatives D-dimer ([facility]) 612.03 (H) 0.00 - 499.00 ng/mL (FEU)   ____________________________________________  EKG My review and personal interpretation at Time: 20:02   Indication: sob  Rate: 95  Rhythm: sinus dys Axis: normal Other: normal intervals, nonspecific st abn ____________________________________________  RADIOLOGY  I personally reviewed all radiographic images ordered to evaluate for the above acute complaints and reviewed radiology reports and findings.  These findings were personally discussed with the patient.  Please see medical record for radiology report.  ____________________________________________   PROCEDURES  Procedure(s) performed:  Procedures    Critical Care performed: no ____________________________________________   INITIAL IMPRESSION / ASSESSMENT AND PLAN / ED COURSE  Pertinent labs & imaging results that were available during my care of the patient were reviewed by me and considered in my medical decision making (see chart for details).   DDX: covid 19, Asthma, copd, CHF, pna, ptx, malignancy, Pe, anemia   LINCON SAHLIN is a 54 y.o. who presents to the ED with presentation as described above.  Patient protecting his airway no fever no hypoxia at rest but does have some increased work of breathing productive sounding cough.  Is in setting of documented Covid illness today.  Blood work shows elevation of troponin but has been downtrending.  Likely demand ischemia.  May be having episodes of hypoxia.  Will observe in the ER.  I will add on D-dimer given his symptoms to evaluate for restratify for PE.  Will give IV fluids.  Clinical Course as of 05/11/20 0717  Thu May 11, 2020  0973 After ambulation trial patient became severely dyspneic dropping down his O2 sat to 88%.  Feeling weak and winded.  Given his comorbidities including significant obesity, do suspect patient will require  hospitalization.  I have ordered Decadron as well as remdesivir.  Still awaiting D-dimer result. [PR]  0707 Have ordered CTA as D-dimer is elevated.  Based on his hypoxia risk factors were discussed with hospitalist for admission.  Have discussed with the patient and available family all diagnostics and treatments performed thus far and all questions were answered to the best of my ability. The patient demonstrates understanding and agreement with plan.  [PR]    Clinical Course User Index [PR] Merlyn Lot, MD    The patient was evaluated in Emergency Department today for the symptoms described in the history of present illness. He/she was evaluated in the context of the global COVID-19 pandemic, which necessitated consideration that the patient might be at risk for infection with the SARS-CoV-2 virus that causes COVID-19. Institutional protocols and algorithms that pertain to the evaluation of patients at risk for COVID-19 are in a state of rapid change based on information released by regulatory bodies including the CDC and federal and state organizations. These policies and algorithms were followed during the patient's care in the ED.  As part of my medical decision making, I reviewed the following data within the [place] notes reviewed and incorporated, Labs reviewed, notes from prior ED visits and [place] Controlled Substance Database   ____________________________________________   FINAL CLINICAL IMPRESSION(S) / ED DIAGNOSES  Final diagnoses:  COVID-19 virus infection  Acute respiratory failure with hypoxia ([place])      NEW MEDICATIONS STARTED DURING THIS VISIT:  New Prescriptions   No medications on file     Note:  This document was prepared using Dragon voice recognition software and may include unintentional dictation errors.    Merlyn Lot, MD 05/11/20 [phone]

## 2020-05-11 NOTE — ED Notes (Signed)
Called pharmacy regarding remdesivir and imdur

## 2020-05-12 ENCOUNTER — Encounter: Payer: Self-pay | Admitting: Internal Medicine

## 2020-05-12 LAB — CBC WITH DIFFERENTIAL/PLATELET
Abs Immature Granulocytes: 0.01 10*3/uL (ref 0.00–0.07)
Basophils Absolute: 0 10*3/uL (ref 0.0–0.1)
Basophils Relative: 0 %
Eosinophils Absolute: 0 10*3/uL (ref 0.0–0.5)
Eosinophils Relative: 0 %
HCT: 42.6 % (ref 39.0–52.0)
Hemoglobin: 14.1 g/dL (ref 13.0–17.0)
Immature Granulocytes: 0 %
Lymphocytes Relative: 35 %
Lymphs Abs: 2.2 10*3/uL (ref 0.7–4.0)
MCH: 30.1 pg (ref 26.0–34.0)
MCHC: 33.1 g/dL (ref 30.0–36.0)
MCV: 90.8 fL (ref 80.0–100.0)
Monocytes Absolute: 0.7 10*3/uL (ref 0.1–1.0)
Monocytes Relative: 11 %
Neutro Abs: 3.5 10*3/uL (ref 1.7–7.7)
Neutrophils Relative %: 54 %
Platelets: 246 10*3/uL (ref 150–400)
RBC: 4.69 MIL/uL (ref 4.22–5.81)
RDW: 12.6 % (ref 11.5–15.5)
WBC: 6.5 10*3/uL (ref 4.0–10.5)
nRBC: 0 % (ref 0.0–0.2)

## 2020-05-12 LAB — COMPREHENSIVE METABOLIC PANEL
ALT: 28 U/L (ref 0–44)
AST: 36 U/L (ref 15–41)
Albumin: 3.3 g/dL — ABNORMAL LOW (ref 3.5–5.0)
Alkaline Phosphatase: 60 U/L (ref 38–126)
Anion gap: 10 (ref 5–15)
BUN: 15 mg/dL (ref 6–20)
CO2: 27 mmol/L (ref 22–32)
Calcium: 8.6 mg/dL — ABNORMAL LOW (ref 8.9–10.3)
Chloride: 103 mmol/L (ref 98–111)
Creatinine, Ser: 0.97 mg/dL (ref 0.61–1.24)
GFR, Estimated: >60 mL/min (ref >60)
Glucose, Bld: 109 mg/dL — ABNORMAL HIGH (ref 70–99)
Potassium: 3.4 mmol/L — ABNORMAL LOW (ref 3.5–5.1)
Sodium: 140 mmol/L (ref 135–145)
Total Bilirubin: 0.5 mg/dL (ref 0.3–1.2)
Total Protein: 7 g/dL (ref 6.5–8.1)

## 2020-05-12 LAB — FERRITIN: Ferritin: 301 ng/mL (ref 24–336)

## 2020-05-12 LAB — C-REACTIVE PROTEIN: CRP: 3.2 mg/dL — ABNORMAL HIGH (ref <1.0)

## 2020-05-12 LAB — PHOSPHORUS: Phosphorus: 3.6 mg/dL (ref 2.5–4.6)

## 2020-05-12 LAB — FIBRIN DERIVATIVES D-DIMER (ARMC ONLY): Fibrin derivatives D-dimer (ARMC): 529.54 ng/mL (FEU) — ABNORMAL HIGH (ref 0.00–499.00)

## 2020-05-12 LAB — MAGNESIUM: Magnesium: 2.2 mg/dL (ref 1.7–2.4)

## 2020-05-12 MED ORDER — MONTELUKAST SODIUM 10 MG PO TABS: 10.0000 mg | ORAL_TABLET | Freq: Every evening | ORAL | Status: DC | PRN

## 2020-05-12 MED ORDER — POTASSIUM CHLORIDE CRYS ER 20 MEQ PO TBCR
40.0000 meq | EXTENDED_RELEASE_TABLET | Freq: Once | ORAL | Status: AC
  Administered 2020-05-12: 12:00:00 40 meq via ORAL
  Filled 2020-05-12: qty 2

## 2020-05-12 MED ORDER — CETIRIZINE HCL 10 MG PO TABS: 10.0000 mg | ORAL_TABLET | Freq: Every day | ORAL | Status: AC | PRN

## 2020-05-12 MED ORDER — FLUTICASONE PROPIONATE 50 MCG/ACT NA SUSP: 2.0000 | Freq: Every day | NASAL | Status: DC | PRN

## 2020-05-12 MED ORDER — CELECOXIB 100 MG PO CAPS
100.0000 mg | ORAL_CAPSULE | Freq: Every day | ORAL | Status: DC | PRN
Start: 2020-05-12 — End: 2020-11-20

## 2020-05-12 MED ORDER — HYDROCOD POLST-CPM POLST ER 10-8 MG/5ML PO SUER: 5.0000 mL | Freq: Two times a day (BID) | ORAL | 0 refills | Status: DC | PRN

## 2020-05-12 MED ORDER — ENOXAPARIN SODIUM 80 MG/0.8ML ~~LOC~~ SOLN: 0.5000 mg/kg | SUBCUTANEOUS | Status: DC

## 2020-05-12 MED ORDER — ALBUTEROL SULFATE HFA 108 (90 BASE) MCG/ACT IN AERS
2.0000 | INHALATION_SPRAY | Freq: Four times a day (QID) | RESPIRATORY_TRACT | Status: DC
Start: 2020-05-12 — End: 2020-06-15

## 2020-05-12 MED ORDER — DEXAMETHASONE 6 MG PO TABS: 6.0000 mg | ORAL_TABLET | ORAL | 0 refills | Status: DC

## 2020-05-12 NOTE — Discharge Summary (Signed)
Physician Discharge Summary   KAINOA SWOBODA  male DOB: 01/18/1966  OYD:[id number]  PCP: Trinna Post, PA-C  Admit date: 05/11/2020 Discharge date: 05/12/2020  Admitted From: home Disposition:  home CODE STATUS: Full code  Discharge Instructions    Discharge instructions   Complete by: As directed    You have COVID infection, but a mild case, and doesn't need extra oxygen even with walking, so you can go home to recover.  You don't have a bacterial pneumonia, so do not need antibiotics.  Please take steroid decadron as directed for 7 days to help reduce inflammation.     Dr. Enzo Bi [facility] Course:  For full details, please see H&P, progress notes, consult notes and ancillary notes.  Briefly,  TYESON TANIMOTO is a 54 y.o. male with medical history significant for morbid obesity, chronic low back pain, hypertension and depression who presented to the emergency room for evaluation of feeling unwell.  Patient states that about a week ago he developed a cough, nasal congestion, fever and chest tightness which he attributed to a sinus infection.  2 days prior to his hospitalization he was started on antibiotics for sinus infection with no improvement in his symptoms.  Patient received 1 dose of the moderna vaccine about 2 months ago and did not complete the vaccination series.  At rest patient had room air pulse oximetry in the 90s but with exertion pulse oximetry drops to 88%.  Pneumonia due to COVID-19 virus with acute respiratory failure CT angiogram showed Mild, patchy bilateral pneumonia.   CRP 4.1 on presentation.  Procal neg.  Pt was ordered Remdesivir and IV decadron on admission.  The next day, pt felt much better with control of cough with Tussionex, and was sating well in room air without needing supplemental O2, pt was therefore discharged on 7 days of oral decadron.  Morbid obesity (BMI 44) Complicates overall prognosis and care  Atrial  fibrillation Patient has a CHADS2VASC score of 1 and is on aspirin as primary prophylaxis for an acute CVA.  Continued Toprol for rate control.  Hypertension Continued home clonidine, Cozaar, Toprol and nitrates   Discharge Diagnoses:  Principal Problem:   Pneumonia due to COVID-19 virus Active Problems:   Essential hypertension   Morbid obesity with BMI of 45.0-49.9, adult (HCC)   GAD (generalized anxiety disorder)   Unspecified atrial fibrillation ([place])   Acute respiratory failure due to COVID-19 [facility])    Discharge Instructions:  Allergies as of 05/12/2020   No Known Allergies     Medication List    STOP taking these medications   amoxicillin-clavulanate 875-125 MG tablet Commonly known as: AUGMENTIN     TAKE these medications   acetaminophen 325 MG tablet Commonly known as: TYLENOL Take 650-975 mg by mouth every 6 (six) hours as needed (for pain.).   albuterol 108 (90 Base) MCG/ACT inhaler Commonly known as: VENTOLIN HFA Inhale 2 puffs into the lungs every 6 (six) hours for 7 days.   aspirin EC 81 MG tablet Take 1 tablet (81 mg total) by mouth daily. Swallow whole.   benzonatate 100 MG capsule Commonly known as: TESSALON Take 2 capsules (200 mg total) by mouth every 8 (eight) hours.   celecoxib 100 MG capsule Commonly known as: CELEBREX Take 1 capsule (100 mg total) by mouth daily as needed. What changed:   when to take this  reasons to take this   cetirizine 10 MG  tablet Commonly known as: ZYRTEC Take 1 tablet (10 mg total) by mouth daily as needed (allergies.).   chlorpheniramine-HYDROcodone 10-8 MG/5ML Suer Commonly known as: TUSSIONEX Take 5 mLs by mouth every 12 (twelve) hours as needed for up to 7 days for cough.   cloNIDine 0.1 mg/24hr patch Commonly known as: CATAPRES - Dosed in mg/24 hr Place 0.1 mg onto the skin every Sunday.   dexamethasone 6 MG tablet Commonly known as: DECADRON Take 1 tablet (6 mg total) by mouth daily for 7  days. Steroid to reduce inflammation. Start taking on: May 13, 2020   fluticasone 50 MCG/ACT nasal spray Commonly known as: FLONASE Place 2 sprays into both nostrils daily as needed (allergies.).   gabapentin 600 MG tablet Commonly known as: NEURONTIN Take 600 mg by mouth 3 (three) times daily.   ibuprofen 200 MG tablet Commonly known as: ADVIL Take 600 mg by mouth every 8 (eight) hours as needed (for pain.).   isosorbide mononitrate 30 MG 24 hr tablet Commonly known as: IMDUR Take 0.5 tablets (15 mg total) by mouth daily.   losartan 100 MG tablet Commonly known as: COZAAR Take 1 tablet (100 mg total) by mouth daily.   metoprolol succinate 50 MG 24 hr tablet Commonly known as: TOPROL-XL Take 0.5 tablets (25 mg total) by mouth daily. Take with or immediately following a meal.   montelukast 10 MG tablet Commonly known as: SINGULAIR Take 1 tablet (10 mg total) by mouth at bedtime as needed (allergies.).        Follow-up Information    Trinna Post, PA-C. Schedule an appointment as soon as possible for a visit in 1 week(s).   Specialty: Physician Assistant Contact information: [address] [place] [address] [phone]        Nelva Bush, MD .   Specialty: Cardiology Contact information: [place] [address] [phone]               No Known Allergies   The results of significant diagnostics from this hospitalization (including imaging, microbiology, ancillary and laboratory) are listed below for reference.   Consultations:   Procedures/Studies: DG Chest 2 View  Result Date: 05/10/2020 CLINICAL DATA:  Chest pain. EXAM: CHEST - 2 VIEW COMPARISON:  October 01, 2011. FINDINGS: Borderline enlargement the cardiac silhouette. Both lungs are clear. No visible pleural effusions or pneumothorax. No acute osseous abnormality. Degenerative changes of the thoracic spine. IMPRESSION: No active  cardiopulmonary disease. Electronically Signed   By: Margaretha Sheffield MD   On: 05/10/2020 20:22   CT Angio Chest PE W and/or Wo Contrast  Result Date: 05/11/2020 CLINICAL DATA:  High probability for pulmonary embolism. Progressively worsening shortness of breath for 4 days EXAM: CT ANGIOGRAPHY CHEST WITH CONTRAST TECHNIQUE: Multidetector CT imaging of the chest was performed using the standard protocol during bolus administration of intravenous contrast. Multiplanar CT image reconstructions and MIPs were obtained to evaluate the vascular anatomy. CONTRAST:  40m OMNIPAQUE IOHEXOL 350 MG/ML SOLN COMPARISON:  None. FINDINGS: Cardiovascular: Satisfactory opacification of the pulmonary arteries to the segmental level. No evidence of pulmonary embolism. Normal heart size. No pericardial effusion. Coronary atherosclerotic calcifications seen along the LAD. Mediastinum/Nodes: Negative for adenopathy or mass Lungs/Pleura: Mild patchy ground-glass opacity in the bilateral lungs. Borderline airway thickening which is generalized. No edema, effusion, or pneumothorax Upper Abdomen: No acute finding Musculoskeletal: Generalized spondylosis. T4-5 non segmentation. No acute or aggressive finding. Review of the MIP images confirms the  above findings. IMPRESSION: 1. Mild, patchy bilateral pneumonia correlating with history of COVID-19. 2. Negative for pulmonary embolism. 3. Coronary atherosclerotic calcification. Electronically Signed   By: Monte Fantasia M.D.   On: 05/11/2020 07:52      Labs: BNP (last 3 results) No results for input(s): BNP in the last 8760 hours. Basic Metabolic Panel: Recent Labs  Lab 05/10/20 2008 05/12/20 0546  NA 138 140  K 3.2* 3.4*  CL 103 103  CO2 25 27  GLUCOSE 108* 109*  BUN 10 15  CREATININE 1.11 0.97  CALCIUM 8.5* 8.6*  MG  --  2.2  PHOS  --  3.6   Liver Function Tests: Recent Labs  Lab 05/10/20 2008 05/12/20 0546  AST 38 36  ALT 33 28  ALKPHOS 72 60  BILITOT 0.7  0.5  PROT 7.9 7.0  ALBUMIN 3.8 3.3*   No results for input(s): LIPASE, AMYLASE in the last 168 hours. No results for input(s): AMMONIA in the last 168 hours. CBC: Recent Labs  Lab 05/10/20 2008 05/12/20 0546  WBC 7.2 6.5  NEUTROABS  --  3.5  HGB 15.1 14.1  HCT 45.3 42.6  MCV 91.1 90.8  PLT 250 246   Cardiac Enzymes: No results for input(s): CKTOTAL, CKMB, CKMBINDEX, TROPONINI in the last 168 hours. BNP: Invalid input(s): POCBNP CBG: No results for input(s): GLUCAP in the last 168 hours. D-Dimer No results for input(s): DDIMER in the last 72 hours. Hgb A1c No results for input(s): HGBA1C in the last 72 hours. Lipid Profile Recent Labs    05/11/20 0757  TRIG 86   Thyroid function studies No results for input(s): TSH, T4TOTAL, T3FREE, THYROIDAB in the last 72 hours.  Invalid input(s): FREET3 Anemia work up Recent Labs    05/11/20 0757 05/12/20 0546  FERRITIN 276 301   Urinalysis    Component Value Date/Time   COLORURINE Yellow 03/03/2013 1158   APPEARANCEUR Clear 03/03/2013 1158   LABSPEC 1.017 03/03/2013 1158   PHURINE 5.0 03/03/2013 1158   GLUCOSEU Negative 03/03/2013 1158   HGBUR 2+ 03/03/2013 1158   BILIRUBINUR small 10/19/2018 1409   BILIRUBINUR Negative 03/03/2013 1158   KETONESUR Negative 03/03/2013 1158   PROTEINUR Positive (A) 10/19/2018 1409   PROTEINUR Negative 03/03/2013 1158   UROBILINOGEN 0.2 10/19/2018 1409   NITRITE negative 10/19/2018 1409   NITRITE Negative 03/03/2013 1158   LEUKOCYTESUR Negative 10/19/2018 1409   LEUKOCYTESUR Negative 03/03/2013 1158   Sepsis Labs Invalid input(s): PROCALCITONIN,  WBC,  LACTICIDVEN Microbiology Recent Results (from the past 240 hour(s))  SARS CORONAVIRUS 2 (TAT 6-24 HRS) Nasopharyngeal Nasopharyngeal Swab     Status: None   Collection Time: 05/05/20  1:25 PM   Specimen: Nasopharyngeal Swab  Result Value Ref Range Status   SARS Coronavirus 2 NEGATIVE NEGATIVE Final    Comment: (NOTE) SARS-CoV-2  target nucleic acids are NOT DETECTED.  The SARS-CoV-2 RNA is generally detectable in upper and lower respiratory specimens during the acute phase of infection. Negative results do not preclude SARS-CoV-2 infection, do not rule out co-infections with other pathogens, and should not be used as the sole basis for treatment or other patient management decisions. Negative results must be combined with clinical observations, patient history, and epidemiological information. The expected result is Negative.  Fact Sheet for Patients: SugarRoll.be  Fact Sheet for Healthcare Providers: https://www.woods-mathews.com/  This test is not yet approved or cleared by the [place] FDA and  has been authorized for detection and/or diagnosis of SARS-CoV-2 by  FDA under an Emergency Use Authorization (EUA). This EUA will remain  in effect (meaning this test can be used) for the duration of the COVID-19 declaration under Se ction 564(b)(1) of the Act, 21 U.S.C. section 360bbb-3(b)(1), unless the authorization is terminated or revoked sooner.  Performed at [facility], [place] [address], [place], [place] [address]   Resp Panel by RT-PCR (Flu A&B, Covid) Nasopharyngeal Swab     Status: Abnormal   Collection Time: 05/10/20  8:09 PM   Specimen: Nasopharyngeal Swab; Nasopharyngeal(NP) swabs in vial transport medium  Result Value Ref Range Status   SARS Coronavirus 2 by RT PCR POSITIVE (A) NEGATIVE Final    Comment: RESULT CALLED TO, READ BACK BY AND VERIFIED WITH: LISA THOMPSON @2126  05/10/20 RH (NOTE) SARS-CoV-2 target nucleic acids are DETECTED.  The SARS-CoV-2 RNA is generally detectable in upper respiratory specimens during the acute phase of infection. Positive results are indicative of the presence of the identified virus, but do not rule out bacterial infection or co-infection with other pathogens not detected by the test. Clinical correlation  with patient history and other diagnostic information is necessary to determine patient infection status. The expected result is Negative.  Fact Sheet for Patients: EntrepreneurPulse.com.au  Fact Sheet for Healthcare Providers: IncredibleEmployment.be  This test is not yet approved or cleared by the [place] FDA and  has been authorized for detection and/or diagnosis of SARS-CoV-2 by FDA under an Emergency Use Authorization (EUA).  This EUA will remain in effect (meaning this test can be [place] ed) for the duration of  the COVID-19 declaration under Section 564(b)(1) of the Act, 21 U.S.C. section 360bbb-3(b)(1), unless the authorization is terminated or revoked sooner.     Influenza A by PCR NEGATIVE NEGATIVE Final   Influenza B by PCR NEGATIVE NEGATIVE Final    Comment: (NOTE) The Xpert Xpress SARS-CoV-2/FLU/RSV plus assay is intended as an aid in the diagnosis of influenza from Nasopharyngeal swab specimens and should not be used as a sole basis for treatment. Nasal washings and aspirates are unacceptable for Xpert Xpress SARS-CoV-2/FLU/RSV testing.  Fact Sheet for Patients: EntrepreneurPulse.com.au  Fact Sheet for Healthcare Providers: IncredibleEmployment.be  This test is not yet approved or cleared by the [place] FDA and has been authorized for detection and/or diagnosis of SARS-CoV-2 by FDA under an Emergency Use Authorization (EUA). This EUA will remain in effect (meaning this test can be used) for the duration of the COVID-19 declaration under Section 564(b)(1) of the Act, 21 U.S.C. section 360bbb-3(b)(1), unless the authorization is terminated or revoked.  Performed at [facility], [address] [place], [place] [address]   Blood Culture (routine x 2)     Status: None (Preliminary result)   Collection Time: 05/11/20  7:58 AM   Specimen: BLOOD  Result Value Ref Range  Status   Specimen Description BLOOD LEFT ANTECUBITAL  Final   Special Requests   Final    BOTTLES DRAWN AEROBIC AND ANAEROBIC Blood Culture adequate volume   Culture   Final    NO GROWTH < 24 HOURS Performed at [facility], [address], [place], [place] [address]    Report Status PENDING  Incomplete  Blood Culture (routine x 2)     Status: None (Preliminary result)   Collection Time: 05/11/20  7:58 AM   Specimen: BLOOD LEFT ARM  Result Value Ref Range Status   Specimen Description BLOOD LEFT ARM  Final   Special Requests   Final  BOTTLES DRAWN AEROBIC AND ANAEROBIC Blood Culture adequate volume   Culture   Final    NO GROWTH < 24 HOURS Performed at [facility], [address] [place], [place] [address]    Report Status PENDING  Incomplete     Total time spend on discharging this patient, including the last patient exam, discussing the hospital stay, instructions for ongoing care as it relates to all pertinent caregivers, as well as preparing the medical discharge records, prescriptions, and/or referrals as applicable, is 40 minutes.    Enzo Bi, MD  [facility] 05/12/2020, 12:03 PM

## 2020-05-12 NOTE — Progress Notes (Signed)
Anticoagulation monitoring(Lovenox):  54yo  M ordered Lovenox 40 mg Q24h  Filed Weights   05/10/20 2006 05/11/20 0743 05/11/20 2254  Weight: 136.1 kg (300 lb) 136.1 kg (300 lb) 135.4 kg (298 lb 8.1 oz)   BMI 44   Lab Results  Component Value Date   CREATININE 0.97 05/12/2020   CREATININE 1.11 05/10/2020   CREATININE 0.98 04/26/2020   Estimated Creatinine Clearance: 120.3 mL/min (by C-G formula based on SCr of 0.97 mg/dL). Hemoglobin & Hematocrit     Component Value Date/Time   HGB 14.1 05/12/2020 0546   HGB 15.8 10/19/2018 1405   HCT 42.6 05/12/2020 0546   HCT 45.4 10/19/2018 1405     Per Protocol for Patient with estCrcl > 30 ml/min and BMI > 30, will transition to Lovenox 0.5mg /kg (67.5 mg) mg Q24h     Chinita Greenland PharmD Clinical Pharmacist 05/12/2020

## 2020-05-15 ENCOUNTER — Telehealth (INDEPENDENT_AMBULATORY_CARE_PROVIDER_SITE_OTHER): Payer: Medicare Other | Admitting: Physician Assistant

## 2020-05-15 ENCOUNTER — Telehealth: Payer: Self-pay

## 2020-05-15 DIAGNOSIS — U071 COVID-19: Secondary | ICD-10-CM | POA: Diagnosis not present

## 2020-05-15 DIAGNOSIS — Z09 Encounter for follow-up examination after completed treatment for conditions other than malignant neoplasm: Secondary | ICD-10-CM

## 2020-05-15 DIAGNOSIS — J1282 Pneumonia due to coronavirus disease 2019: Secondary | ICD-10-CM | POA: Diagnosis not present

## 2020-05-15 MED ORDER — PREDNISONE 10 MG PO TABS: ORAL_TABLET | ORAL | 0 refills | Status: DC

## 2020-05-15 NOTE — Telephone Encounter (Signed)
Transition Care Management Follow-up Telephone Call Date of discharge and from where: [facility] on 05/12/20 How have you been since you were released from the hospital? Doing better but is still coughing a lot. Sputum is mostly clear but occasionally sees a greenish tint or blood. Today is the first day pt has not ran a fever. Pt still has SOB but only when coughing. Last Sp02 reading was 94. Pt still has loose stools but no diarrhea. Declines pain, fever or n/v. Any questions or concerns? No   Items Reviewed: Did the pt receive and understand the discharge instructions provided? Yes  Medications obtained and verified? Yes  Any new allergies since your discharge? No  Dietary orders reviewed? N/A Do you have support at home? Yes  Other (ie: DME, Home Health, etc): N/A  Functional Questionnaire: (I = Independent and D = Dependent)  Bathing/Dressing- I   Meal Prep- I  Eating- I  Maintaining continence- I  Transferring/Ambulation- I  Managing Meds- I   Follow up appointments reviewed:   PCP Hospital f/u appt confirmed? Yes  scheduled for a virtual visit with Carles Collet today @ 4:00 PM. [facility] f/u appt confirmed?  N/A   Are transportation arrangements needed? No  If their condition worsens, is the pt aware to call  their PCP or go to the ED? Yes Was the patient provided with contact information for the PCP's office or ED? Yes Was the pt encouraged to call back with questions or concerns? Yes

## 2020-05-15 NOTE — Telephone Encounter (Signed)
Virtual HFU scheduled today @ 4 PM.

## 2020-05-15 NOTE — Telephone Encounter (Signed)
Duplicate

## 2020-05-15 NOTE — Progress Notes (Signed)
MyChart Video Visit    Virtual Visit via Video Note   This visit type was conducted due to national recommendations for restrictions regarding the COVID-19 Pandemic (e.g. social distancing) in an effort to limit this patient's exposure and mitigate transmission in our community. This patient is at least at moderate risk for complications without adequate follow up. This format is felt to be most appropriate for this patient at this time. Physical exam was limited by quality of the video and audio technology used for the visit.   Patient location: Home Provider location: Office   I discussed the limitations of evaluation and management by telemedicine and the availability of in person appointments. The patient expressed understanding and agreed to proceed.  Patient: Aaron [name]   DOB: 1966/05/19   54 y.o. Male  MRN: [id number] Visit Date: 05/15/2020  Today's healthcare provider: Trinna Post, PA-C   Chief Complaint  Patient presents with  . Covid Positive   Subjective    HPI  Follow up Hospitalization  Patient was admitted to [facility] on 05/11/20 and discharged on 05/12/2020. He was treated for pneumonia due to COVID-19 virus. Treatment for this included:labs, chest x-ray and EKG, remdsivir, decadron.  Telephone follow up was done on 05/15/2020 w/ McKenzie He reports good compliance with treatment. He reports this condition is stayed the same.  He was discharged on decadron 6 mg daily and albuterol daily. He feels somewhat better  ----------------------------------------------------------------------------------------- -     Medications: Outpatient Medications Prior to Visit  Medication Sig  . acetaminophen (TYLENOL) 325 MG tablet Take 650-975 mg by mouth every 6 (six) hours as needed (for pain.).  [place] Kitchen albuterol (VENTOLIN HFA) 108 (90 Base) MCG/ACT inhaler Inhale 2 puffs into the lungs every 6 (six) hours for 7 days.  [place] Kitchen aspirin EC 81 MG tablet Take 1 tablet (81 mg  total) by mouth daily. Swallow whole.  . benzonatate (TESSALON) 100 MG capsule Take 2 capsules (200 mg total) by mouth every 8 (eight) hours.  . celecoxib (CELEBREX) 100 MG capsule Take 1 capsule (100 mg total) by mouth daily as needed.  . cetirizine (ZYRTEC) 10 MG tablet Take 1 tablet (10 mg total) by mouth daily as needed (allergies.).  [place] Kitchen chlorpheniramine-HYDROcodone (TUSSIONEX) 10-8 MG/5ML SUER Take 5 mLs by mouth every 12 (twelve) hours as needed for up to 7 days for cough.  . cloNIDine (CATAPRES - DOSED IN MG/24 HR) 0.1 mg/24hr patch Place 0.1 mg onto the skin every Sunday.   [place] Kitchen dexamethasone (DECADRON) 6 MG tablet Take 1 tablet (6 mg total) by mouth daily for 7 days. Steroid to reduce inflammation.  . fluticasone (FLONASE) 50 MCG/ACT nasal spray Place 2 sprays into both nostrils daily as needed (allergies.).  [place] Kitchen gabapentin (NEURONTIN) 600 MG tablet Take 600 mg by mouth 3 (three) times daily.  [place] Kitchen ibuprofen (ADVIL) 200 MG tablet Take 600 mg by mouth every 8 (eight) hours as needed (for pain.).  [place] Kitchen isosorbide mononitrate (IMDUR) 30 MG 24 hr tablet Take 0.5 tablets (15 mg total) by mouth daily.  [place] Kitchen losartan (COZAAR) 100 MG tablet Take 1 tablet (100 mg total) by mouth daily.  . metoprolol succinate (TOPROL-XL) 50 MG 24 hr tablet Take 0.5 tablets (25 mg total) by mouth daily. Take with or immediately following a meal.  . montelukast (SINGULAIR) 10 MG tablet Take 1 tablet (10 mg total) by mouth at bedtime as needed (allergies.).   No facility-administered medications prior to visit.    Review of Systems  Objective    There were no vitals taken for this visit.   Physical Exam Constitutional:      Appearance: Normal appearance.  Pulmonary:     Effort: Pulmonary effort is normal. No respiratory distress.  Neurological:     Mental Status: He is alert.  Psychiatric:        Mood and Affect: Mood normal.        Behavior: Behavior normal.        Assessment & Plan    1. Pneumonia due to  COVID-19 virus  Change to prednisone taper, counseled on return precautions. Counseled on repeat CXR in 4-6 weeks to ensure resolution.   - predniSONE (DELTASONE) 10 MG tablet; Take 6 pills on day 1, 5 pills on day 2 and so on until complete.  Dispense: 21 tablet; Refill: 0 - DG Chest 2 View; Future  2. Hospital discharge follow-up    No follow-ups on file.     I discussed the assessment and treatment plan with the patient. The patient was provided an opportunity to ask questions and all were answered. The patient agreed with the plan and demonstrated an understanding of the instructions.   The patient was advised to call back or seek an in-person evaluation if the symptoms worsen or if the condition fails to improve as anticipated.   ITrinna Post, PA-C, have reviewed all documentation for this visit. The documentation on 05/16/20 for the exam, diagnosis, procedures, and orders are all accurate and complete.  The entirety of the information documented in the History of Present Illness, Review of Systems and Physical Exam were personally obtained by me. Portions of this information were initially documented by [facility] and reviewed by me for thoroughness and accuracy.    Paulene Floor [facility] [phone] (phone) [phone] (fax)  [place]

## 2020-05-16 ENCOUNTER — Other Ambulatory Visit: Payer: Self-pay | Admitting: Physician Assistant

## 2020-05-16 LAB — CULTURE, BLOOD (ROUTINE X 2)
Culture: NO GROWTH
Culture: NO GROWTH
Special Requests: ADEQUATE
Special Requests: ADEQUATE

## 2020-05-16 NOTE — Telephone Encounter (Signed)
Requested medication (s) are due for refill today: no  Requested medication (s) are on the active medication list: yes  Last refill:  05/12/2020  Future visit scheduled: no  Notes to clinic:  this refill cannot be delegated    Requested Prescriptions  Pending Prescriptions Disp Refills   chlorpheniramine-HYDROcodone (TUSSIONEX) 10-8 MG/5ML SUER 70 mL 0    Sig: Take 5 mLs by mouth every 12 (twelve) hours as needed for up to 7 days for cough.      There is no refill protocol information for this order

## 2020-05-16 NOTE — Telephone Encounter (Signed)
Medication Refill - Medication: chlorpheniramine-HYDROcodone ([place]) 10-8 MG/5ML SUER     Preferred Pharmacy (with phone number or street name):  [id number] [place] [place] [address]:  [phone] Fax:  [phone]      Agent: Please be advised that RX refills may take up to 3 business days. We ask that you follow-up with your pharmacy.

## 2020-05-18 ENCOUNTER — Inpatient Hospital Stay
Admission: EM | Admit: 2020-05-18 | Discharge: 2020-05-20 | DRG: 177 | Disposition: A | Discharge status: Home Health | Payer: Medicare Other | Attending: Internal Medicine | Admitting: Internal Medicine

## 2020-05-18 ENCOUNTER — Other Ambulatory Visit: Payer: Self-pay

## 2020-05-18 ENCOUNTER — Encounter: Payer: Self-pay | Admitting: *Deleted

## 2020-05-18 ENCOUNTER — Emergency Department: Payer: Medicare Other

## 2020-05-18 DIAGNOSIS — Z9852 Vasectomy status: Secondary | ICD-10-CM | POA: Diagnosis not present

## 2020-05-18 DIAGNOSIS — Z8249 Family history of ischemic heart disease and other diseases of the circulatory system: Secondary | ICD-10-CM | POA: Diagnosis not present

## 2020-05-18 DIAGNOSIS — F329 Major depressive disorder, single episode, unspecified: Secondary | ICD-10-CM | POA: Diagnosis present

## 2020-05-18 DIAGNOSIS — R7989 Other specified abnormal findings of blood chemistry: Secondary | ICD-10-CM | POA: Diagnosis present

## 2020-05-18 DIAGNOSIS — Z7982 Long term (current) use of aspirin: Secondary | ICD-10-CM | POA: Diagnosis not present

## 2020-05-18 DIAGNOSIS — I480 Paroxysmal atrial fibrillation: Secondary | ICD-10-CM | POA: Diagnosis present

## 2020-05-18 DIAGNOSIS — Z79899 Other long term (current) drug therapy: Secondary | ICD-10-CM | POA: Diagnosis not present

## 2020-05-18 DIAGNOSIS — D72829 Elevated white blood cell count, unspecified: Secondary | ICD-10-CM | POA: Diagnosis present

## 2020-05-18 DIAGNOSIS — Z7901 Long term (current) use of anticoagulants: Secondary | ICD-10-CM | POA: Diagnosis not present

## 2020-05-18 DIAGNOSIS — I4891 Unspecified atrial fibrillation: Secondary | ICD-10-CM | POA: Diagnosis not present

## 2020-05-18 DIAGNOSIS — J9600 Acute respiratory failure, unspecified whether with hypoxia or hypercapnia: Secondary | ICD-10-CM | POA: Diagnosis present

## 2020-05-18 DIAGNOSIS — R778 Other specified abnormalities of plasma proteins: Secondary | ICD-10-CM | POA: Diagnosis not present

## 2020-05-18 DIAGNOSIS — Z87442 Personal history of urinary calculi: Secondary | ICD-10-CM

## 2020-05-18 DIAGNOSIS — I248 Other forms of acute ischemic heart disease: Secondary | ICD-10-CM | POA: Diagnosis present

## 2020-05-18 DIAGNOSIS — J9601 Acute respiratory failure with hypoxia: Secondary | ICD-10-CM

## 2020-05-18 DIAGNOSIS — D75838 Other thrombocytosis: Secondary | ICD-10-CM | POA: Diagnosis present

## 2020-05-18 DIAGNOSIS — U071 COVID-19: Secondary | ICD-10-CM | POA: Diagnosis present

## 2020-05-18 DIAGNOSIS — I10 Essential (primary) hypertension: Secondary | ICD-10-CM | POA: Diagnosis present

## 2020-05-18 DIAGNOSIS — J1282 Pneumonia due to coronavirus disease 2019: Secondary | ICD-10-CM | POA: Diagnosis present

## 2020-05-18 DIAGNOSIS — Z6841 Body Mass Index (BMI) 40.0 and over, adult: Secondary | ICD-10-CM | POA: Diagnosis not present

## 2020-05-18 DIAGNOSIS — E876 Hypokalemia: Secondary | ICD-10-CM | POA: Diagnosis present

## 2020-05-18 DIAGNOSIS — F419 Anxiety disorder, unspecified: Secondary | ICD-10-CM | POA: Diagnosis present

## 2020-05-18 DIAGNOSIS — G8929 Other chronic pain: Secondary | ICD-10-CM | POA: Diagnosis present

## 2020-05-18 DIAGNOSIS — M549 Dorsalgia, unspecified: Secondary | ICD-10-CM | POA: Diagnosis present

## 2020-05-18 DIAGNOSIS — R0602 Shortness of breath: Secondary | ICD-10-CM | POA: Diagnosis not present

## 2020-05-18 DIAGNOSIS — T380X5A Adverse effect of glucocorticoids and synthetic analogues, initial encounter: Secondary | ICD-10-CM | POA: Diagnosis present

## 2020-05-18 HISTORY — DX: Hypokalemia: E87.6

## 2020-05-18 HISTORY — DX: Other specified abnormal findings of blood chemistry: R79.89

## 2020-05-18 LAB — COMPREHENSIVE METABOLIC PANEL
ALT: 78 U/L — ABNORMAL HIGH (ref 0–44)
AST: 52 U/L — ABNORMAL HIGH (ref 15–41)
Albumin: 3 g/dL — ABNORMAL LOW (ref 3.5–5.0)
Alkaline Phosphatase: 81 U/L (ref 38–126)
Anion gap: 13 (ref 5–15)
BUN: 24 mg/dL — ABNORMAL HIGH (ref 6–20)
CO2: 25 mmol/L (ref 22–32)
Calcium: 8.4 mg/dL — ABNORMAL LOW (ref 8.9–10.3)
Chloride: 100 mmol/L (ref 98–111)
Creatinine, Ser: 1.07 mg/dL (ref 0.61–1.24)
GFR, Estimated: >60 mL/min (ref >60)
Glucose, Bld: 162 mg/dL — ABNORMAL HIGH (ref 70–99)
Potassium: 3 mmol/L — ABNORMAL LOW (ref 3.5–5.1)
Sodium: 138 mmol/L (ref 135–145)
Total Bilirubin: 0.8 mg/dL (ref 0.3–1.2)
Total Protein: 7.1 g/dL (ref 6.5–8.1)

## 2020-05-18 LAB — LACTATE DEHYDROGENASE: LDH: 246 U/L — ABNORMAL HIGH (ref 98–192)

## 2020-05-18 LAB — PROCALCITONIN: Procalcitonin: <0.1 ng/mL

## 2020-05-18 LAB — CBC WITH DIFFERENTIAL/PLATELET
Abs Immature Granulocytes: 0.33 10*3/uL — ABNORMAL HIGH (ref 0.00–0.07)
Basophils Absolute: 0 10*3/uL (ref 0.0–0.1)
Basophils Relative: 0 %
Eosinophils Absolute: 0 10*3/uL (ref 0.0–0.5)
Eosinophils Relative: 0 %
HCT: 47.5 % (ref 39.0–52.0)
Hemoglobin: 16.2 g/dL (ref 13.0–17.0)
Immature Granulocytes: 2 %
Lymphocytes Relative: 14 %
Lymphs Abs: 2.2 10*3/uL (ref 0.7–4.0)
MCH: 30.1 pg (ref 26.0–34.0)
MCHC: 34.1 g/dL (ref 30.0–36.0)
MCV: 88.3 fL (ref 80.0–100.0)
Monocytes Absolute: 1 10*3/uL (ref 0.1–1.0)
Monocytes Relative: 6 %
Neutro Abs: 12.3 10*3/uL — ABNORMAL HIGH (ref 1.7–7.7)
Neutrophils Relative %: 78 %
Platelets: 493 10*3/uL — ABNORMAL HIGH (ref 150–400)
RBC: 5.38 MIL/uL (ref 4.22–5.81)
RDW: 12.1 % (ref 11.5–15.5)
WBC: 15.9 10*3/uL — ABNORMAL HIGH (ref 4.0–10.5)
nRBC: 0 % (ref 0.0–0.2)

## 2020-05-18 LAB — TROPONIN I (HIGH SENSITIVITY)
Troponin I (High Sensitivity): 28 ng/L — ABNORMAL HIGH (ref <18)
Troponin I (High Sensitivity): 33 ng/L — ABNORMAL HIGH (ref <18)
Troponin I (High Sensitivity): 26 ng/L — ABNORMAL HIGH (ref <18)
Troponin I (High Sensitivity): 29 ng/L — ABNORMAL HIGH (ref <18)

## 2020-05-18 LAB — FERRITIN: Ferritin: 330 ng/mL (ref 24–336)

## 2020-05-18 LAB — MAGNESIUM: Magnesium: 2.1 mg/dL (ref 1.7–2.4)

## 2020-05-18 LAB — BRAIN NATRIURETIC PEPTIDE: B Natriuretic Peptide: 161.2 pg/mL — ABNORMAL HIGH (ref 0.0–100.0)

## 2020-05-18 LAB — FIBRIN DERIVATIVES D-DIMER (ARMC ONLY): Fibrin derivatives D-dimer (ARMC): 420.02 ng/mL (FEU) (ref 0.00–499.00)

## 2020-05-18 LAB — FIBRINOGEN: Fibrinogen: 446 mg/dL (ref 210–475)

## 2020-05-18 LAB — TRIGLYCERIDES: Triglycerides: 88 mg/dL (ref <150)

## 2020-05-18 MED ORDER — HYDRALAZINE HCL 20 MG/ML IJ SOLN: 5.0000 mg | INTRAMUSCULAR | Status: DC | PRN

## 2020-05-18 MED ORDER — SODIUM CHLORIDE 0.9 % IV SOLN
200.0000 mg | Freq: Once | INTRAVENOUS | Status: AC
  Administered 2020-05-18: 200 mg via INTRAVENOUS
  Filled 2020-05-18: qty 200

## 2020-05-18 MED ORDER — MONTELUKAST SODIUM 10 MG PO TABS: 10.0000 mg | ORAL_TABLET | Freq: Every evening | ORAL | Status: DC | PRN

## 2020-05-18 MED ORDER — MAGNESIUM SULFATE IN D5W 1-5 GM/100ML-% IV SOLN
1.0000 g | Freq: Once | INTRAVENOUS | Status: AC
  Administered 2020-05-18: 1 g via INTRAVENOUS
  Filled 2020-05-18: qty 100

## 2020-05-18 MED ORDER — HYDROCOD POLST-CPM POLST ER 10-8 MG/5ML PO SUER: 5.0000 mL | Freq: Two times a day (BID) | ORAL | 0 refills | Status: AC | PRN

## 2020-05-18 MED ORDER — METOPROLOL SUCCINATE ER 25 MG PO TB24
25.0000 mg | ORAL_TABLET | Freq: Every day | ORAL | Status: DC
  Administered 2020-05-18 – 2020-05-20 (×3): 25 mg via ORAL
  Filled 2020-05-18 (×3): qty 1

## 2020-05-18 MED ORDER — POTASSIUM CHLORIDE CRYS ER 20 MEQ PO TBCR
40.0000 meq | EXTENDED_RELEASE_TABLET | ORAL | Status: AC
  Administered 2020-05-18: 40 meq via ORAL
  Filled 2020-05-18: qty 2

## 2020-05-18 MED ORDER — ENOXAPARIN SODIUM 80 MG/0.8ML ~~LOC~~ SOLN
0.5000 mg/kg | SUBCUTANEOUS | Status: DC
  Administered 2020-05-18 – 2020-05-19 (×2): 67.5 mg via SUBCUTANEOUS
  Filled 2020-05-18 (×4): qty 0.8

## 2020-05-18 MED ORDER — POTASSIUM CHLORIDE 10 MEQ/100ML IV SOLN
10.0000 meq | INTRAVENOUS | Status: AC
  Filled 2020-05-18: qty 100

## 2020-05-18 MED ORDER — ONDANSETRON HCL 4 MG/2ML IJ SOLN: 4.0000 mg | Freq: Three times a day (TID) | INTRAMUSCULAR | Status: DC | PRN

## 2020-05-18 MED ORDER — ASPIRIN EC 81 MG PO TBEC
81.0000 mg | DELAYED_RELEASE_TABLET | Freq: Every day | ORAL | Status: DC
  Administered 2020-05-19 – 2020-05-20 (×2): 81 mg via ORAL
  Filled 2020-05-18 (×2): qty 1

## 2020-05-18 MED ORDER — METHYLPREDNISOLONE SODIUM SUCC 125 MG IJ SOLR
60.0000 mg | Freq: Two times a day (BID) | INTRAMUSCULAR | Status: DC
  Administered 2020-05-18 – 2020-05-20 (×4): 60 mg via INTRAVENOUS
  Filled 2020-05-18 (×4): qty 2

## 2020-05-18 MED ORDER — LOSARTAN POTASSIUM 50 MG PO TABS
100.0000 mg | ORAL_TABLET | Freq: Every day | ORAL | Status: DC
  Administered 2020-05-18 – 2020-05-20 (×3): 100 mg via ORAL
  Filled 2020-05-18 (×3): qty 2

## 2020-05-18 MED ORDER — DM-GUAIFENESIN ER 30-600 MG PO TB12: 1.0000 | ORAL_TABLET | Freq: Two times a day (BID) | ORAL | Status: DC | PRN

## 2020-05-18 MED ORDER — FLUTICASONE PROPIONATE 50 MCG/ACT NA SUSP
2.0000 | Freq: Every day | NASAL | Status: DC | PRN
  Filled 2020-05-18: qty 16

## 2020-05-18 MED ORDER — LORATADINE 10 MG PO TABS
10.0000 mg | ORAL_TABLET | Freq: Every day | ORAL | Status: DC
  Administered 2020-05-19 – 2020-05-20 (×2): 10 mg via ORAL
  Filled 2020-05-18 (×2): qty 1

## 2020-05-18 MED ORDER — ISOSORBIDE MONONITRATE ER 30 MG PO TB24
15.0000 mg | ORAL_TABLET | Freq: Every day | ORAL | Status: DC
  Administered 2020-05-18 – 2020-05-20 (×3): 15 mg via ORAL
  Filled 2020-05-18 (×3): qty 1

## 2020-05-18 MED ORDER — ACETAMINOPHEN 325 MG PO TABS: 650.0000 mg | ORAL_TABLET | Freq: Four times a day (QID) | ORAL | Status: DC | PRN

## 2020-05-18 MED ORDER — SODIUM CHLORIDE 0.9 % IV SOLN
100.0000 mg | Freq: Every day | INTRAVENOUS | Status: AC
  Administered 2020-05-19 – 2020-05-20 (×2): 100 mg via INTRAVENOUS
  Filled 2020-05-18 (×3): qty 20

## 2020-05-18 MED ORDER — DEXAMETHASONE SODIUM PHOSPHATE 10 MG/ML IJ SOLN
10.0000 mg | Freq: Once | INTRAMUSCULAR | Status: AC
  Administered 2020-05-18: 10 mg via INTRAVENOUS
  Filled 2020-05-18: qty 1

## 2020-05-18 MED ORDER — ASCORBIC ACID 500 MG PO TABS
500.0000 mg | ORAL_TABLET | Freq: Every day | ORAL | Status: DC
  Administered 2020-05-18 – 2020-05-20 (×3): 500 mg via ORAL
  Filled 2020-05-18 (×3): qty 1

## 2020-05-18 MED ORDER — GABAPENTIN 600 MG PO TABS
600.0000 mg | ORAL_TABLET | Freq: Three times a day (TID) | ORAL | Status: DC
  Administered 2020-05-18 – 2020-05-19 (×2): 600 mg via ORAL
  Filled 2020-05-18 (×5): qty 1

## 2020-05-18 MED ORDER — IPRATROPIUM BROMIDE HFA 17 MCG/ACT IN AERS
2.0000 | INHALATION_SPRAY | RESPIRATORY_TRACT | Status: DC
  Administered 2020-05-18 – 2020-05-20 (×9): 2 via RESPIRATORY_TRACT
  Filled 2020-05-18 (×2): qty 12.9

## 2020-05-18 MED ORDER — ALBUTEROL SULFATE HFA 108 (90 BASE) MCG/ACT IN AERS
2.0000 | INHALATION_SPRAY | RESPIRATORY_TRACT | Status: DC | PRN
  Filled 2020-05-18: qty 6.7

## 2020-05-18 MED ORDER — ZINC SULFATE 220 (50 ZN) MG PO CAPS
220.0000 mg | ORAL_CAPSULE | Freq: Every day | ORAL | Status: DC
  Administered 2020-05-18 – 2020-05-20 (×3): 220 mg via ORAL
  Filled 2020-05-18 (×3): qty 1

## 2020-05-18 NOTE — H&P (Signed)
History and Physical    Aaron [name] XIH:[id number] DOB: December 28, 1965 DOA: 05/18/2020  Referring MD/NP/PA:   PCP: Trinna Post, PA-C   Patient coming from:  The patient is coming from home.  At baseline, pt is independent for most of ADL.        Chief Complaint: SOB  HPI: Aaron [name] is a 53 y.o. male with medical history significant of hypertension, atrial fibrillation on anticoagulants, chronic back pain, obesity, depression, anxiety, ADD, who presents with shortness breath.  Patient was recently hospitalized from 12/9-10/10 due to COVID-19 pneumonia. Pt was treated with remdesivir in hospital and discharged on tapering dose of prednisone.  Patient received monoclonal antibody infusion as outpatient.  Patient states that he continues to have cough and shortness breath, which has progressively worsening.  Currently patient does not have chest pain, fever, chills, nausea, vomiting, diarrhea or abdominal pain.  No symptoms of UTI. Patient was found to have oxygen desaturation to 88% on room air, which improved to 97% on 2 L oxygen.  ED Course: pt was found to have WBC 15.9, troponin level 28, BNP 161, potassium 3.0, creatinine 1.07, BUN 24, temperature normal, blood pressure 179/105, heart rate 50, 72, RR 21, 14, chest x-ray showed bilateral patchy infiltration, with low volume.  Patient is admitted to [name] as inpatient.  Review of Systems:   General: no fevers, chills, no body weight gain, has fatigue HEENT: no blurry vision, hearing changes or sore throat Respiratory: has dyspnea, coughing, no wheezing CV: no chest pain, no palpitations GI: no nausea, vomiting, abdominal pain, diarrhea, constipation GU: no dysuria, burning on urination, increased urinary frequency, hematuria  Ext: no leg edema Neuro: no unilateral weakness, numbness, or tingling, no vision change or hearing loss Skin: no rash, no skin tear. MSK: No muscle spasm, no deformity, no limitation of range  of movement in spin Heme: No easy bruising.  Travel history: No recent long distant travel.  Allergy: No Known Allergies  Past Medical History:  Diagnosis Date  . A-fib ([place])   . Chronic back pain    disc  . Hypertension   . Kidney stone   . MDD (major depressive disorder)     Past Surgical History:  Procedure Laterality Date  . KIDNEY STONE SURGERY    . VASECTOMY      Social History:  reports that he has never smoked. He has never used smokeless tobacco. He reports previous alcohol use. He reports that he does not use drugs.  Family History:  Family History  Problem Relation Age of Onset  . COPD Mother   . Cancer Mother   . Hypertension Father   . Congestive Heart Failure Father   . COPD Father   . Lung cancer Father   . Heart attack Father 28  . Hypertension Sister   . Diabetes Paternal Grandfather   . Heart attack Paternal Grandfather   . Healthy Son   . Healthy Daughter      Prior to Admission medications   Medication Sig Start Date End Date Taking? Authorizing Provider  acetaminophen (TYLENOL) 325 MG tablet Take 650-975 mg by mouth every 6 (six) hours as needed (for pain.).    [provider]  albuterol (VENTOLIN HFA) 108 (90 Base) MCG/ACT inhaler Inhale 2 puffs into the lungs every 6 (six) hours for 7 days. 05/12/20 05/19/20  Enzo Bi, MD  aspirin EC 81 MG tablet Take 1 tablet (81 mg total) by mouth daily. Swallow whole. 12/17/19  End, Harrell Gave, MD  benzonatate (TESSALON) 100 MG capsule Take 2 capsules (200 mg total) by mouth every 8 (eight) hours. 05/08/20   Loura Halt A, NP  celecoxib (CELEBREX) 100 MG capsule Take 1 capsule (100 mg total) by mouth daily as needed. 05/12/20   Enzo Bi, MD  cetirizine (ZYRTEC) 10 MG tablet Take 1 tablet (10 mg total) by mouth daily as needed (allergies.). 05/12/20   Enzo Bi, MD  chlorpheniramine-HYDROcodone (TUSSIONEX) 10-8 MG/5ML SUER Take 5 mLs by mouth every 12 (twelve) hours as needed for up to 7 days for  cough. 05/18/20 05/25/20  Trinna Post, PA-C  cloNIDine (CATAPRES - DOSED IN MG/24 HR) 0.1 mg/24hr patch Place 0.1 mg onto the skin every Sunday.  04/20/20   [provider]  fluticasone (FLONASE) 50 MCG/ACT nasal spray Place 2 sprays into both nostrils daily as needed (allergies.). 05/12/20   Enzo Bi, MD  gabapentin (NEURONTIN) 600 MG tablet Take 600 mg by mouth 3 (three) times daily. 04/20/20   [provider]  ibuprofen (ADVIL) 200 MG tablet Take 600 mg by mouth every 8 (eight) hours as needed (for pain.).    [provider]  isosorbide mononitrate (IMDUR) 30 MG 24 hr tablet Take 0.5 tablets (15 mg total) by mouth daily. 03/30/20 06/28/20  End, Harrell Gave, MD  losartan (COZAAR) 100 MG tablet Take 1 tablet (100 mg total) by mouth daily. 11/19/19   Trinna Post, PA-C  metoprolol succinate (TOPROL-XL) 50 MG 24 hr tablet Take 0.5 tablets (25 mg total) by mouth daily. Take with or immediately following a meal. 03/30/20 06/28/20  End, Harrell Gave, MD  montelukast (SINGULAIR) 10 MG tablet Take 1 tablet (10 mg total) by mouth at bedtime as needed (allergies.). 05/12/20   Enzo Bi, MD  predniSONE (DELTASONE) 10 MG tablet Take 6 pills on day 1, 5 pills on day 2 and so on until complete. 05/15/20   Trinna Post, PA-C    Physical Exam: Vitals:   05/18/20 0730 05/18/20 0830 05/18/20 0900 05/18/20 1000  BP: (!) 154/94 (!) 158/95 (!) 149/95 (!) 153/96  Pulse:  60 (!) 50 72  Resp: 19 11 15 14   Temp:      TempSrc:      SpO2: 96% 95% 94% 97%  Weight:      Height:       General: Not in acute distress HEENT:       Eyes: PERRL, EOMI, no scleral icterus.       ENT: No discharge from the ears and nose, no pharynx injection, no tonsillar enlargement.        Neck: No JVD, no bruit, no mass felt. Heme: No neck lymph node enlargement. Cardiac: S1/S2, RRR, No murmurs, No gallops or rubs. Respiratory: Decreased air movement bilaterally GI: Soft, nondistended,  nontender, no rebound pain, no organomegaly, BS present. GU: No hematuria Ext: No pitting leg edema bilaterally. 1+DP/PT pulse bilaterally. Musculoskeletal: No joint deformities, No joint redness or warmth, no limitation of ROM in spin. Skin: No rashes.  Neuro: Alert, oriented X3, cranial nerves II-XII grossly intact, moves all extremities normally. Psych: Patient is not psychotic, no suicidal or hemocidal ideation.  Labs on Admission: I have personally reviewed following labs and imaging studies  CBC: Recent Labs  Lab 05/12/20 0546 05/18/20 0730  WBC 6.5 15.9*  NEUTROABS 3.5 12.3*  HGB 14.1 16.2  HCT 42.6 47.5  MCV 90.8 88.3  PLT 246 161*   Basic Metabolic Panel: Recent Labs  Lab 05/12/20  5726 05/18/20 0730  NA 140 138  K 3.4* 3.0*  CL 103 100  CO2 27 25  GLUCOSE 109* 162*  BUN 15 24*  CREATININE 0.97 1.07  CALCIUM 8.6* 8.4*  MG 2.2  --   PHOS 3.6  --    GFR: Estimated Creatinine Clearance: 108 mL/min (by C-G formula based on SCr of 1.07 mg/dL). Liver Function Tests: Recent Labs  Lab 05/12/20 0546 05/18/20 0730  AST 36 52*  ALT 28 78*  ALKPHOS 60 81  BILITOT 0.5 0.8  PROT 7.0 7.1  ALBUMIN 3.3* 3.0*   No results for input(s): LIPASE, AMYLASE in the last 168 hours. No results for input(s): AMMONIA in the last 168 hours. Coagulation Profile: No results for input(s): INR, PROTIME in the last 168 hours. Cardiac Enzymes: No results for input(s): CKTOTAL, CKMB, CKMBINDEX, TROPONINI in the last 168 hours. BNP (last 3 results) No results for input(s): PROBNP in the last 8760 hours. HbA1C: No results for input(s): HGBA1C in the last 72 hours. CBG: No results for input(s): GLUCAP in the last 168 hours. Lipid Profile: No results for input(s): CHOL, HDL, LDLCALC, TRIG, CHOLHDL, LDLDIRECT in the last 72 hours. Thyroid Function Tests: No results for input(s): TSH, T4TOTAL, FREET4, T3FREE, THYROIDAB in the last 72 hours. Anemia Panel: No results for input(s):  VITAMINB12, FOLATE, FERRITIN, TIBC, IRON, RETICCTPCT in the last 72 hours. Urine analysis:    Component Value Date/Time   COLORURINE Yellow 03/03/2013 1158   APPEARANCEUR Clear 03/03/2013 1158   LABSPEC 1.017 03/03/2013 1158   PHURINE 5.0 03/03/2013 1158   GLUCOSEU Negative 03/03/2013 1158   HGBUR 2+ 03/03/2013 1158   BILIRUBINUR small 10/19/2018 1409   BILIRUBINUR Negative 03/03/2013 1158   KETONESUR Negative 03/03/2013 1158   PROTEINUR Positive (A) 10/19/2018 1409   PROTEINUR Negative 03/03/2013 1158   UROBILINOGEN 0.2 10/19/2018 1409   NITRITE negative 10/19/2018 1409   NITRITE Negative 03/03/2013 1158   LEUKOCYTESUR Negative 10/19/2018 1409   LEUKOCYTESUR Negative 03/03/2013 1158   Sepsis Labs: @LABRCNTIP (procalcitonin:4,lacticidven:4) ) Recent Results (from the past 240 hour(s))  Resp Panel by RT-PCR (Flu A&B, Covid) Nasopharyngeal Swab     Status: Abnormal   Collection Time: 05/10/20  8:09 PM   Specimen: Nasopharyngeal Swab; Nasopharyngeal(NP) swabs in vial transport medium  Result Value Ref Range Status   SARS Coronavirus 2 by RT PCR POSITIVE (A) NEGATIVE Final    Comment: RESULT CALLED TO, READ BACK BY AND VERIFIED WITH: LISA THOMPSON @2126  05/10/20 RH (NOTE) SARS-CoV-2 target nucleic acids are DETECTED.  The SARS-CoV-2 RNA is generally detectable in upper respiratory specimens during the acute phase of infection. Positive results are indicative of the presence of the identified virus, but do not rule out bacterial infection or co-infection with other pathogens not detected by the test. Clinical correlation with patient history and other diagnostic information is necessary to determine patient infection status. The expected result is Negative.  Fact Sheet for Patients: EntrepreneurPulse.com.au  Fact Sheet for Healthcare Providers: IncredibleEmployment.be  This test is not yet approved or cleared by the [place] FDA and   has been authorized for detection and/or diagnosis of SARS-CoV-2 by FDA under an Emergency Use Authorization (EUA).  This EUA will remain in effect (meaning this test can be [place] ed) for the duration of  the COVID-19 declaration under Section 564(b)(1) of the Act, 21 U.S.C. section 360bbb-3(b)(1), unless the authorization is terminated or revoked sooner.     Influenza A by PCR NEGATIVE NEGATIVE Final  Influenza B by PCR NEGATIVE NEGATIVE Final    Comment: (NOTE) The Xpert Xpress SARS-CoV-2/FLU/RSV plus assay is intended as an aid in the diagnosis of influenza from Nasopharyngeal swab specimens and should not be used as a sole basis for treatment. Nasal washings and aspirates are unacceptable for Xpert Xpress SARS-CoV-2/FLU/RSV testing.  Fact Sheet for Patients: EntrepreneurPulse.com.au  Fact Sheet for Healthcare Providers: IncredibleEmployment.be  This test is not yet approved or cleared by the [place] FDA and has been authorized for detection and/or diagnosis of SARS-CoV-2 by FDA under an Emergency Use Authorization (EUA). This EUA will remain in effect (meaning this test can be used) for the duration of the COVID-19 declaration under Section 564(b)(1) of the Act, 21 U.S.C. section 360bbb-3(b)(1), unless the authorization is terminated or revoked.  Performed at [facility], [address] [place], [place] [address]   Blood Culture (routine x 2)     Status: None   Collection Time: 05/11/20  7:58 AM   Specimen: BLOOD  Result Value Ref Range Status   Specimen Description BLOOD LEFT ANTECUBITAL  Final   Special Requests   Final    BOTTLES DRAWN AEROBIC AND ANAEROBIC Blood Culture adequate volume   Culture   Final    NO GROWTH 5 DAYS Performed at [facility], [address], [place], [place] [address]    Report Status 05/16/2020 FINAL  Final  Blood Culture (routine x 2)     Status: None   Collection Time:  05/11/20  7:58 AM   Specimen: BLOOD LEFT ARM  Result Value Ref Range Status   Specimen Description BLOOD LEFT ARM  Final   Special Requests   Final    BOTTLES DRAWN AEROBIC AND ANAEROBIC Blood Culture adequate volume   Culture   Final    NO GROWTH 5 DAYS Performed at [facility], [address], [place], [place] [address]    Report Status 05/16/2020 FINAL  Final     Radiological Exams on Admission: DG Chest Port 1 View  Result Date: 05/18/2020 CLINICAL DATA:  54 year old male recently diagnosed with COVID-19. Shortness of breath. EXAM: PORTABLE CHEST 1 VIEW COMPARISON:  CTA chest 05/11/2020 and earlier. FINDINGS: Portable AP upright view at 0724 hours. Lower lung volumes. Patchy and indistinct peripheral and asymmetric basilar pulmonary opacity, typical for COVID-19 pneumonia. Stable cardiac size and mediastinal contours. Visualized tracheal air column is within normal limits. No pneumothorax or pleural effusion. No acute osseous abnormality identified. IMPRESSION: Lower lung volumes with patchy and indistinct bilateral pulmonary opacity typical of COVID-19 pneumonia. Electronically Signed   By: Genevie Ann M.D.   On: 05/18/2020 07:45     EKG: I have personally reviewed.  QTC 457, frequent PVC, early R wave progression  Assessment/Plan Principal Problem:   Pneumonia due to COVID-19 virus Active Problems:   Essential hypertension   Unspecified atrial fibrillation (HCC)   Acute respiratory failure due to COVID-19 (HCC)   Hypokalemia   Elevated troponin   Acute respiratory failure with hypoxia  2/2 COVID-19Pneumonia due to COVID-19 virus: Was recently hospitalized and treated with remdesivir for 1 night.  Patient failed outpatient oral steroid and monoclonal antibody treatment.  Has oxygen desaturation to 88%, currently on 2 L oxygen with a saturation 97%.  Chest x-ray showed bilateral patchy infiltration.  Patient has leukocytosis with WBC 15.9 which is likely due to steroid  use.  No fever.  Clinically does not seem to have sepsis.   -will admit to med-surg bed  as inpt -Remdesivir per pharm -Solumedrol 40 mg bid -vitamin C, zinc.  -Bronchodilators -PRN Mucinex for cough -f/u Blood culture -D-dimer, BNP,Trop, LFT, CRP, LDH, Procalcitonin, Ferritin, fibinogen, TG, Hep B SAg, HIV ab -Daily CRP, Ferritin, D-dimer, -Will ask the patient to maintain an awake prone position for 16+ hours a day, if possible, with a minimum of 2-3 hours at a time -Will attempt to maintain euvolemia to a net negative fluid status -IF patient deteriorates, will consult PCCM and ID  Essential hypertension -IV hydralazine as needed -pt is on clonidine patch every Sunday -Continue Cozaar, metoprolol -Also on Imdur  Unspecified atrial fibrillation ([place]): CHA2DS2-VASc Score is 1, not need oral anticoagulation. HR 50 -->72 -continue metoprolol and aspirin  Hypokalemia: K3.0 - Repleted - Check Mg level - Give 1 g of magnesium sulfate  Elevated troponin: trop 28 -->33. No CP.  Likely due to demand ischemia -Continue aspirin, Imdur -Trend troponin -Check A1c, FLP -Repeat EKG in morning       DVT ppx:  SQ Lovenox Code Status: Full code Family Communication:    Yes, patient's fianc by phone  Disposition Plan:  Anticipate discharge back to previous environment Consults called:  none Admission status: Med-surg bed as inpt     Status is: Inpatient  Remains inpatient appropriate because:Inpatient level of care appropriate due to severity of illness.  Patient has multiple comorbidities, now presents with acute respiratory failure with hypoxia due to COVID-19 pneumonia.  Patient failed outpatient steroid and monoclonal antibody treatment.  Now has oxygen desaturated to 88% on room air.  Chest x-ray positive for bilateral patchy infiltration.  His presentation is highly complicated.  Patient is at high risk of deteriorating.  We need to be treated in hospital for at least 2  days.   Dispo: The patient is from: Home              Anticipated d/c is to: Home              Anticipated d/c date is: 2 days              Patient currently is not medically stable to d/c.          Date of Service 05/18/2020    [facility]   If 7PM-7AM, please contact night-coverage [email or web address] 05/18/2020, 10:34 AM

## 2020-05-18 NOTE — ED Notes (Signed)
Pt eating dinner tray °

## 2020-05-18 NOTE — ED Notes (Signed)
Resumed care from annie rn.  Pt alert  Pt watching tv.  Iv meds infusing. Pt waiting on admission bed.  Pt on 2 liters oxygen [place]

## 2020-05-18 NOTE — Progress Notes (Signed)
Remdesivir - Pharmacy Brief Note   O:  ALT:  28 CXR: Lower lung volumes with patchy and indistinct bilateral pulmonary opacity typical of COVID-19 pneumonia SpO2: 88% on RA   A/P:  Remdesivir 200 mg IVPB once followed by 100 mg IVPB daily x 4 days.   Chinita Greenland PharmD Clinical Pharmacist 05/18/2020

## 2020-05-18 NOTE — ED Triage Notes (Signed)
Pt was dx with covid "a few days ago" and continues to have increasing sob. Pt states that he noted that his spo2 was under 90% this am and he came in to be seen.  Pt states that he was admitted one night for covid and then sent home and has continued to feel worse.

## 2020-05-18 NOTE — ED Provider Notes (Signed)
[facility] Emergency Department Provider Note   ____________________________________________   Event Date/Time   First MD Initiated Contact with Patient 05/18/20 0720     (approximate)  I have reviewed the triage vital signs and the nursing notes.   HISTORY  Chief Complaint Shortness of Breath (Covid +)    HPI Aaron [name] is a 54 y.o. male with a stated past medical history of hypertension, chronic back pain, and A. fib who presents for worsening shortness of breath in the setting of of COVID-19 infection.  Patient states that he was diagnosed with Covid approximately 1 week ago and had a 1 night stay in the hospital.  Since that time patient states that his symptoms initially improved but over the last 2 days have worsened again with a pulse ox reading at home of 84%.  Patient states that he has already had multiple doses of monoclonal antibody infusion as well as a round of Decadron.  Patient states that he is currently on a prednisone taper.  Patient denies Covid vaccination.  Patient currently denies any vision changes, tinnitus, difficulty speaking, facial droop, sore throat, chest pain, abdominal pain, nausea/vomiting/diarrhea, dysuria, or weakness/numbness/paresthesias in any extremity         Past Medical History:  Diagnosis Date  . A-fib ([place])   . Chronic back pain    disc  . Hypertension   . Kidney stone   . MDD (major depressive disorder)     Patient Active Problem List   Diagnosis Date Noted  . Hypokalemia 05/18/2020  . Elevated troponin 05/18/2020  . Pneumonia due to COVID-19 virus 05/11/2020  . Unspecified atrial fibrillation ([place]) 05/11/2020  . Acute respiratory failure due to COVID-19 ([place]) 05/11/2020  . Palpitations 03/31/2020  . Abnormal EKG 03/31/2020  . Shortness of breath 03/31/2020  . Chest pain 12/17/2019  . Amaurosis fugax 12/17/2019  . MDD (major depressive disorder), single episode, moderate ([place]) 11/19/2018  .  Bereavement 11/19/2018  . GAD (generalized anxiety disorder) 11/19/2018  . 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 ADD (attention deficit disorder) 09/02/2018  . Essential hypertension 09/02/2018  . Kidney stones 09/02/2018  . Murmur 09/02/2018  . Morbid obesity ([place]) 05/21/2018  . Morbid obesity with BMI of 45.0-49.9, adult ([place]) 09/20/2015    Past Surgical History:  Procedure Laterality Date  . KIDNEY STONE SURGERY    . VASECTOMY      Prior to Admission medications   Medication Sig Start Date End Date Taking? Authorizing Provider  acetaminophen (TYLENOL) 325 MG tablet Take 650-975 mg by mouth every 6 (six) hours as needed (for pain.).    [provider]  albuterol (VENTOLIN HFA) 108 (90 Base) MCG/ACT inhaler Inhale 2 puffs into the lungs every 6 (six) hours for 7 days. 05/12/20 05/19/20  Enzo Bi, MD  aspirin EC 81 MG tablet Take 1 tablet (81 mg total) by mouth daily. Swallow whole. 12/17/19   End, Harrell Gave, MD  benzonatate (TESSALON) 100 MG capsule Take 2 capsules (200 mg total) by mouth every 8 (eight) hours. 05/08/20   Loura Halt A, NP  celecoxib (CELEBREX) 100 MG capsule Take 1 capsule (100 mg total) by mouth daily as needed. 05/12/20   Enzo Bi, MD  cetirizine (ZYRTEC) 10 MG tablet Take 1 tablet (10 mg total) by mouth daily as needed (allergies.). 05/12/20   Enzo Bi, MD  chlorpheniramine-HYDROcodone (TUSSIONEX) 10-8 MG/5ML SUER Take 5 mLs by mouth every 12 (twelve) hours as needed for up to 7  days for cough. 05/18/20 05/25/20  Trinna Post, PA-C  cloNIDine (CATAPRES - DOSED IN MG/24 HR) 0.1 mg/24hr patch Place 0.1 mg onto the skin every Sunday.  04/20/20   [provider]  fluticasone (FLONASE) 50 MCG/ACT nasal spray Place 2 sprays into both nostrils daily as needed (allergies.). 05/12/20   Enzo Bi, MD  gabapentin (NEURONTIN) 600 MG tablet Take 600 mg by mouth 3 (three) times daily. 04/20/20   [provider]  ibuprofen (ADVIL) 200 MG  tablet Take 600 mg by mouth every 8 (eight) hours as needed (for pain.).    [provider]  isosorbide mononitrate (IMDUR) 30 MG 24 hr tablet Take 0.5 tablets (15 mg total) by mouth daily. 03/30/20 06/28/20  End, Harrell Gave, MD  losartan (COZAAR) 100 MG tablet Take 1 tablet (100 mg total) by mouth daily. 11/19/19   Trinna Post, PA-C  metoprolol succinate (TOPROL-XL) 50 MG 24 hr tablet Take 0.5 tablets (25 mg total) by mouth daily. Take with or immediately following a meal. 03/30/20 06/28/20  End, Harrell Gave, MD  montelukast (SINGULAIR) 10 MG tablet Take 1 tablet (10 mg total) by mouth at bedtime as needed (allergies.). 05/12/20   Enzo Bi, MD  predniSONE (DELTASONE) 10 MG tablet Take 6 pills on day 1, 5 pills on day 2 and so on until complete. 05/15/20   Trinna Post, PA-C    Allergies Patient has no known allergies.  Family History  Problem Relation Age of Onset  . COPD Mother   . Cancer Mother   . Hypertension Father   . Congestive Heart Failure Father   . COPD Father   . Lung cancer Father   . Heart attack Father 56  . Hypertension Sister   . Diabetes Paternal Grandfather   . Heart attack Paternal Grandfather   . Healthy Son   . Healthy Daughter     Social History Social History   Tobacco Use  . Smoking status: Never Smoker  . Smokeless tobacco: Never Used  Vaping Use  . Vaping Use: Never used  Substance Use Topics  . Alcohol use: Not Currently  . Drug use: Never    Review of Systems Constitutional: No fever/chills Eyes: No visual changes. ENT: No sore throat. Cardiovascular: Denies chest pain. Respiratory: Endorses shortness of breath. Gastrointestinal: No abdominal pain.  No nausea, no vomiting.  No diarrhea. Genitourinary: Negative for dysuria. Musculoskeletal: Negative for acute arthralgias Skin: Negative for rash. Neurological: Negative for headaches, weakness/numbness/paresthesias in any extremity Psychiatric: Negative for suicidal  ideation/homicidal ideation   ____________________________________________   PHYSICAL EXAM:  VITAL SIGNS: ED Triage Vitals  Enc Vitals Group     BP 05/18/20 0711 (!) 179/105     Pulse Rate 05/18/20 0711 86     Resp 05/18/20 0711 (!) 21     Temp 05/18/20 0711 98.1 F (36.7 C)     Temp Source 05/18/20 0711 Oral     SpO2 05/18/20 0711 (!) 88 %     Weight 05/18/20 0711 293 lb (132.9 kg)     Height 05/18/20 0711 5' 9"  (1.753 m)     Head Circumference --      Peak Flow --      Pain Score 05/18/20 0717 4     Pain Loc --      Pain Edu? --      Excl. in [place]? --    Constitutional: Alert and oriented. Well appearing and in no acute distress. Eyes: Conjunctivae are normal. PERRL. Head:  Atraumatic. Nose: No congestion/rhinnorhea. Mouth/Throat: Mucous membranes are moist. Neck: No stridor Cardiovascular: Grossly normal heart sounds.  Good peripheral circulation. Respiratory: Rales over bilateral lung fields.  3 L oxygen by nasal cannula.  Normal respiratory effort.  No retractions. Gastrointestinal: Soft and nontender. No distention. Musculoskeletal: No obvious deformities Neurologic:  Normal speech and language. No gross focal neurologic deficits are appreciated. Skin:  Skin is warm and dry. No rash noted. Psychiatric: Mood and affect are normal. Speech and behavior are normal.  ____________________________________________   LABS (all labs ordered are listed, but only abnormal results are displayed)  Labs Reviewed  COMPREHENSIVE METABOLIC PANEL - Abnormal; Notable for the following components:      Result Value   Potassium 3.0 (*)    Glucose, Bld 162 (*)    BUN 24 (*)    Calcium 8.4 (*)    Albumin 3.0 (*)    AST 52 (*)    ALT 78 (*)    All other components within normal limits  BRAIN NATRIURETIC PEPTIDE - Abnormal; Notable for the following components:   B Natriuretic Peptide 161.2 (*)    All other components within normal limits  CBC WITH DIFFERENTIAL/PLATELET -  Abnormal; Notable for the following components:   WBC 15.9 (*)    Platelets 493 (*)    Neutro Abs 12.3 (*)    Abs Immature Granulocytes 0.33 (*)    All other components within normal limits  TROPONIN I (HIGH SENSITIVITY) - Abnormal; Notable for the following components:   Troponin I (High Sensitivity) 28 (*)    All other components within normal limits  FIBRIN DERIVATIVES D-DIMER ([facility] ONLY)  PROCALCITONIN  MAGNESIUM  TROPONIN I (HIGH SENSITIVITY)   ____________________________________________  EKG  ED ECG REPORT I, Naaman Plummer, the attending physician, personally viewed and interpreted this ECG.  Date: 05/18/2020 EKG Time: 0726  Rate: 104 Rhythm: Tachycardic sinus rhythm QRS Axis: normal Intervals: normal ST/T Wave abnormalities: normal Narrative Interpretation: no evidence of acute ischemia  ____________________________________________  RADIOLOGY  ED MD interpretation: Single view portable x-ray of the chest shows lower lung volumes with patchy and indistinct bilateral pulmonary opacities consistent with COVID-19 pneumonia.  No evidence of pneumothorax or widened mediastinum  Official radiology report(s): DG Chest Port 1 View  Result Date: 05/18/2020 CLINICAL DATA:  54 year old male recently diagnosed with COVID-19. Shortness of breath. EXAM: PORTABLE CHEST 1 VIEW COMPARISON:  CTA chest 05/11/2020 and earlier. FINDINGS: Portable AP upright view at 0724 hours. Lower lung volumes. Patchy and indistinct peripheral and asymmetric basilar pulmonary opacity, typical for COVID-19 pneumonia. Stable cardiac size and mediastinal contours. Visualized tracheal air column is within normal limits. No pneumothorax or pleural effusion. No acute osseous abnormality identified. IMPRESSION: Lower lung volumes with patchy and indistinct bilateral pulmonary opacity typical of COVID-19 pneumonia. Electronically Signed   By: Genevie Ann M.D.   On: 05/18/2020 07:45     ____________________________________________   PROCEDURES  Procedure(s) performed (including Critical Care):  .Critical Care Performed by: Naaman Plummer, MD Authorized by: Naaman Plummer, MD   Critical care provider statement:    Critical care time (minutes):  35   Critical care time was exclusive of:  Separately billable procedures and treating other patients   Critical care was necessary to treat or prevent imminent or life-threatening deterioration of the following conditions:  Respiratory failure   Critical care was time spent personally by me on the following activities:  Discussions with consultants, evaluation of patient's response to treatment, examination  of patient, ordering and performing treatments and interventions, ordering and review of laboratory studies, ordering and review of radiographic studies, pulse oximetry, re-evaluation of patient's condition, obtaining history from patient or surrogate and review of old charts   I assumed direction of critical care for this patient from another provider in my specialty: no      ____________________________________________   INITIAL IMPRESSION / ASSESSMENT AND PLAN / ED COURSE  As part of my medical decision making, I reviewed the following data within the [place] notes reviewed and incorporated, Labs reviewed, EKG interpreted, Old chart reviewed, Radiograph reviewed and Notes from prior ED visits reviewed and incorporated        Presentation most consistent with Viral Syndrome.  Patient has tested positive for COVID-19. At this time patient is requiring submental oxygenation due to acute hypoxic respiratory failure.  Given History and Exam I have a lower suspicion for: Emergent CardioPulmonary causes [such as Acute Asthma or COPD Exacerbation, acute Heart Failure or exacerbation, PE, PTX, atypical ACS, PNA]. Emergent Otolaryngeal causes [such as PTA, RPA, [place], Epiglottitis,  EBV].  Regarding Emergent Travel or Immunosuppressive related infectious: I have a low suspicion for acute HIV.  Given radiologic evidence for patchy bilateral airspace opacities concerning for viral pneumonia, continued need for supplemental oxygenation due to acute hypoxic respiratory failure, and need for further evaluation and management, patient will require admission      ____________________________________________   FINAL CLINICAL IMPRESSION(S) / ED DIAGNOSES  Final diagnoses:  SOB (shortness of breath)  Pneumonia due to COVID-19 virus  Acute respiratory failure with hypoxia [facility])     ED Discharge Orders    None       Note:  This document was prepared using Dragon voice recognition software and may include unintentional dictation errors.   Naaman Plummer, MD 05/18/20 1026

## 2020-05-19 DIAGNOSIS — J9600 Acute respiratory failure, unspecified whether with hypoxia or hypercapnia: Secondary | ICD-10-CM | POA: Diagnosis not present

## 2020-05-19 DIAGNOSIS — E876 Hypokalemia: Secondary | ICD-10-CM | POA: Diagnosis not present

## 2020-05-19 DIAGNOSIS — J1282 Pneumonia due to coronavirus disease 2019: Secondary | ICD-10-CM | POA: Diagnosis not present

## 2020-05-19 DIAGNOSIS — U071 COVID-19: Secondary | ICD-10-CM | POA: Diagnosis not present

## 2020-05-19 LAB — LIPID PANEL
Cholesterol: 96 mg/dL (ref 0–200)
HDL: 27 mg/dL — ABNORMAL LOW (ref >40)
LDL Cholesterol: 55 mg/dL (ref 0–99)
Total CHOL/HDL Ratio: 3.6 RATIO
Triglycerides: 70 mg/dL (ref <150)
VLDL: 14 mg/dL (ref 0–40)

## 2020-05-19 LAB — COMPREHENSIVE METABOLIC PANEL
ALT: 71 U/L — ABNORMAL HIGH (ref 0–44)
AST: 36 U/L (ref 15–41)
Albumin: 2.8 g/dL — ABNORMAL LOW (ref 3.5–5.0)
Alkaline Phosphatase: 62 U/L (ref 38–126)
Anion gap: 10 (ref 5–15)
BUN: 25 mg/dL — ABNORMAL HIGH (ref 6–20)
CO2: 29 mmol/L (ref 22–32)
Calcium: 8.1 mg/dL — ABNORMAL LOW (ref 8.9–10.3)
Chloride: 100 mmol/L (ref 98–111)
Creatinine, Ser: 0.99 mg/dL (ref 0.61–1.24)
GFR, Estimated: >60 mL/min (ref >60)
Glucose, Bld: 183 mg/dL — ABNORMAL HIGH (ref 70–99)
Potassium: 3.4 mmol/L — ABNORMAL LOW (ref 3.5–5.1)
Sodium: 139 mmol/L (ref 135–145)
Total Bilirubin: 0.9 mg/dL (ref 0.3–1.2)
Total Protein: 6.7 g/dL (ref 6.5–8.1)

## 2020-05-19 LAB — BRAIN NATRIURETIC PEPTIDE: B Natriuretic Peptide: 181.7 pg/mL — ABNORMAL HIGH (ref 0.0–100.0)

## 2020-05-19 LAB — HEMOGLOBIN A1C
Hgb A1c MFr Bld: 6.2 % — ABNORMAL HIGH (ref 4.8–5.6)
Mean Plasma Glucose: 131.24 mg/dL

## 2020-05-19 LAB — CBC WITH DIFFERENTIAL/PLATELET
Abs Immature Granulocytes: 0.31 10*3/uL — ABNORMAL HIGH (ref 0.00–0.07)
Basophils Absolute: 0 10*3/uL (ref 0.0–0.1)
Basophils Relative: 0 %
Eosinophils Absolute: 0 10*3/uL (ref 0.0–0.5)
Eosinophils Relative: 0 %
HCT: 45.2 % (ref 39.0–52.0)
Hemoglobin: 15.4 g/dL (ref 13.0–17.0)
Immature Granulocytes: 2 %
Lymphocytes Relative: 13 %
Lymphs Abs: 1.7 10*3/uL (ref 0.7–4.0)
MCH: 30.4 pg (ref 26.0–34.0)
MCHC: 34.1 g/dL (ref 30.0–36.0)
MCV: 89.2 fL (ref 80.0–100.0)
Monocytes Absolute: 0.7 10*3/uL (ref 0.1–1.0)
Monocytes Relative: 5 %
Neutro Abs: 10.9 10*3/uL — ABNORMAL HIGH (ref 1.7–7.7)
Neutrophils Relative %: 80 %
Platelets: 438 10*3/uL — ABNORMAL HIGH (ref 150–400)
RBC: 5.07 MIL/uL (ref 4.22–5.81)
RDW: 12.2 % (ref 11.5–15.5)
WBC: 13.6 10*3/uL — ABNORMAL HIGH (ref 4.0–10.5)
nRBC: 0 % (ref 0.0–0.2)

## 2020-05-19 LAB — FIBRIN DERIVATIVES D-DIMER (ARMC ONLY): Fibrin derivatives D-dimer (ARMC): 383.03 ng/mL (FEU) (ref 0.00–499.00)

## 2020-05-19 LAB — HEPATITIS B SURFACE ANTIGEN: Hepatitis B Surface Ag: NONREACTIVE

## 2020-05-19 LAB — C-REACTIVE PROTEIN
CRP: 1.3 mg/dL — ABNORMAL HIGH (ref <1.0)
CRP: 1 mg/dL — ABNORMAL HIGH (ref <1.0)

## 2020-05-19 LAB — FERRITIN: Ferritin: 278 ng/mL (ref 24–336)

## 2020-05-19 LAB — HIV ANTIBODY (ROUTINE TESTING W REFLEX): HIV Screen 4th Generation wRfx: NONREACTIVE

## 2020-05-19 LAB — MAGNESIUM: Magnesium: 2.5 mg/dL — ABNORMAL HIGH (ref 1.7–2.4)

## 2020-05-19 LAB — PROCALCITONIN: Procalcitonin: <0.1 ng/mL

## 2020-05-19 MED ORDER — FUROSEMIDE 10 MG/ML IJ SOLN
40.0000 mg | Freq: Once | INTRAMUSCULAR | Status: AC
  Administered 2020-05-19: 14:00:00 40 mg via INTRAVENOUS
  Filled 2020-05-19: qty 4

## 2020-05-19 MED ORDER — POTASSIUM CHLORIDE 20 MEQ PO PACK
40.0000 meq | PACK | Freq: Once | ORAL | Status: AC
  Administered 2020-05-19: 40 meq via ORAL
  Filled 2020-05-19: qty 2

## 2020-05-19 MED ORDER — CLONIDINE HCL 0.1 MG/24HR TD PTWK: 0.1000 mg | MEDICATED_PATCH | TRANSDERMAL | Status: DC

## 2020-05-19 NOTE — Progress Notes (Signed)
PROGRESS NOTE    Aaron [name]  BSJ:[id number] DOB: 1965/06/18 DOA: 05/18/2020 PCP: Trinna Post, PA-C   Chief complaint.  Shortness of breath Brief Narrative:  Aaron [name] is a 54 y.o. male with medical history significant of hypertension, atrial fibrillation on anticoagulants, chronic back pain, obesity, depression, anxiety, ADD, who presents with shortness breath. Aaron [name] was recently hospitalized from 12/9-10/10 due to COVID-19 pneumonia. Aaron [name] was treated with remdesivir in hospital and discharged on tapering dose of prednisone.  Aaron [name] received monoclonal antibody infusion as outpatient. At this admission, Aaron [name] is started on 3 L oxygen, remdesivir were restarted, started IV steroids   Assessment & Plan:   Principal Problem:   Pneumonia due to COVID-19 virus Active Problems:   Essential hypertension   Unspecified atrial fibrillation ([place])   Acute respiratory failure due to COVID-19 ([place])   Hypokalemia   Elevated troponin  #1.  Acute respiratory failure with hypoxemia secondary to Covid pneumonia. Multifocal pneumonia secondary to COVID-19 infection. Aaron [name] had a recent hospitalization due to Covid, at that time, he did not need oxygen.  He also received 3 days of remdesivir. We will continue IV steroids, complete remaining dose of remdesivir. Aaron [name] currently has a mild elevation of BNP, will give a dose of IV Lasix. Anticipating improvement in oxygenation to 2 L tomorrow, Aaron [name] probably can be discharged home with oxygen tomorrow.  #2.  Paroxysmal atrial fibrillation. Not chronically on anticoagulation.  3.  Hypokalemia. Supplement again.  Recheck level tomorrow.  #4.  Mild elevation of troponin. Secondary to Covid with demand ischemia.  5.  Reactive thrombocytosis. Secondary to Covid.  #6 essential hypertension. Continue home medicines.     DVT prophylaxis: Lovenox Code Status: full Family Communication:  Disposition Plan:  .   Status  is: Inpatient  Remains inpatient appropriate because:Inpatient level of care appropriate due to severity of illness   Dispo: The Aaron [name] is from: Home              Anticipated d/c is to: Home              Anticipated d/c date is: 1 day              Aaron [name] currently is not medically stable to d/c.        No intake/output data recorded. No intake/output data recorded.     Consultants:   None  Procedures: None  Antimicrobials: None  Subjective: Aaron [name] feels much better today, states that he is back to his baseline.  However, he still on 3L oxygen. Denies any abdominal pain or nausea vomiting.  No diarrhea constipation. No fever or chills per No headache or dizziness.  Objective: Vitals:   05/19/20 0031 05/19/20 0526 05/19/20 0741 05/19/20 0825  BP: (!) 150/97 (!) 137/95 (!) 151/106 (!) 148/86  Pulse: 64 (!) 57 61   Resp: 19 16 18    Temp: 98.3 F (36.8 C) 97.7 F (36.5 C) 98.2 F (36.8 C)   TempSrc: Oral Oral Oral   SpO2: 96% 93% 96% 94%  Weight:      Height:       No intake or output data in the 24 hours ending 05/19/20 1100 Filed Weights   05/18/20 0711  Weight: 132.9 kg    Examination:  General exam: Appears calm and comfortable  Respiratory system: Clear to auscultation. Respiratory effort normal. Cardiovascular system: S1 & S2 heard, RRR. No JVD, murmurs, rubs, gallops or clicks. No pedal edema. Gastrointestinal system: Abdomen is nondistended, soft  and nontender. No organomegaly or masses felt. Normal bowel sounds heard. Central nervous system: Alert and oriented. No focal neurological deficits. Extremities: Symmetric 5 x 5 power. Skin: No rashes, lesions or ulcers Psychiatry:  Mood & affect appropriate.     Data Reviewed: I have personally reviewed following labs and imaging studies  CBC: Recent Labs  Lab 05/18/20 0730 05/19/20 0500  WBC 15.9* 13.6*  NEUTROABS 12.3* 10.9*  HGB 16.2 15.4  HCT 47.5 45.2  MCV 88.3 89.2  PLT 493* 438*    Basic Metabolic Panel: Recent Labs  Lab 05/18/20 0730 05/18/20 1025 05/19/20 0500  NA 138  --  139  K 3.0*  --  3.4*  CL 100  --  100  CO2 25  --  29  GLUCOSE 162*  --  183*  BUN 24*  --  25*  CREATININE 1.07  --  0.99  CALCIUM 8.4*  --  8.1*  MG  --  2.1 2.5*   GFR: Estimated Creatinine Clearance: 116.7 mL/min (by C-G formula based on SCr of 0.99 mg/dL). Liver Function Tests: Recent Labs  Lab 05/18/20 0730 05/19/20 0500  AST 52* 36  ALT 78* 71*  ALKPHOS 81 62  BILITOT 0.8 0.9  PROT 7.1 6.7  ALBUMIN 3.0* 2.8*   No results for input(s): LIPASE, AMYLASE in the last 168 hours. No results for input(s): AMMONIA in the last 168 hours. Coagulation Profile: No results for input(s): INR, PROTIME in the last 168 hours. Cardiac Enzymes: No results for input(s): CKTOTAL, CKMB, CKMBINDEX, TROPONINI in the last 168 hours. BNP (last 3 results) No results for input(s): PROBNP in the last 8760 hours. HbA1C: Recent Labs    05/19/20 0500  HGBA1C 6.2*   CBG: No results for input(s): GLUCAP in the last 168 hours. Lipid Profile: Recent Labs    05/18/20 2119 05/19/20 0500  CHOL  --  96  HDL  --  27*  LDLCALC  --  55  TRIG 88 70  CHOLHDL  --  3.6   Thyroid Function Tests: No results for input(s): TSH, T4TOTAL, FREET4, T3FREE, THYROIDAB in the last 72 hours. Anemia Panel: Recent Labs    05/18/20 2058 05/19/20 0500  FERRITIN 330 278   Sepsis Labs: Recent Labs  Lab 05/18/20 0730 05/19/20 0500  PROCALCITON <0.10 <0.10    Recent Results (from the past 240 hour(s))  Resp Panel by RT-PCR (Flu A&B, Covid) Nasopharyngeal Swab     Status: Abnormal   Collection Time: 05/10/20  8:09 PM   Specimen: Nasopharyngeal Swab; Nasopharyngeal(NP) swabs in vial transport medium  Result Value Ref Range Status   SARS Coronavirus 2 by RT PCR POSITIVE (A) NEGATIVE Final    Comment: RESULT CALLED TO, READ BACK BY AND VERIFIED WITH: LISA THOMPSON @2126  05/10/20 RH (NOTE) SARS-CoV-2  target nucleic acids are DETECTED.  The SARS-CoV-2 RNA is generally detectable in upper respiratory specimens during the acute phase of infection. Positive results are indicative of the presence of the identified virus, but do not rule out bacterial infection or co-infection with other pathogens not detected by the test. Clinical correlation with Aaron [name] history and other diagnostic information is necessary to determine Aaron [name] infection status. The expected result is Negative.  Fact Sheet for Patients: EntrepreneurPulse.com.au  Fact Sheet for Healthcare Providers: IncredibleEmployment.be  This test is not yet approved or cleared by the [place] FDA and  has been authorized for detection and/or diagnosis of SARS-CoV-2 by FDA under an Emergency Use Authorization (EUA).  This  EUA will remain in effect (meaning this test can be [place] ed) for the duration of  the COVID-19 declaration under Section 564(b)(1) of the Act, 21 U.S.C. section 360bbb-3(b)(1), unless the authorization is terminated or revoked sooner.     Influenza A by PCR NEGATIVE NEGATIVE Final   Influenza B by PCR NEGATIVE NEGATIVE Final    Comment: (NOTE) The Xpert Xpress SARS-CoV-2/FLU/RSV plus assay is intended as an aid in the diagnosis of influenza from Nasopharyngeal swab specimens and should not be used as a sole basis for treatment. Nasal washings and aspirates are unacceptable for Xpert Xpress SARS-CoV-2/FLU/RSV testing.  Fact Sheet for Patients: EntrepreneurPulse.com.au  Fact Sheet for Healthcare Providers: IncredibleEmployment.be  This test is not yet approved or cleared by the [place] FDA and has been authorized for detection and/or diagnosis of SARS-CoV-2 by FDA under an Emergency Use Authorization (EUA). This EUA will remain in effect (meaning this test can be used) for the duration of the COVID-19 declaration under Section  564(b)(1) of the Act, 21 U.S.C. section 360bbb-3(b)(1), unless the authorization is terminated or revoked.  Performed at [facility], [name] [place], [place] [address]   Blood Culture (routine x 2)     Status: None   Collection Time: 05/11/20  7:58 AM   Specimen: BLOOD  Result Value Ref Range Status   Specimen Description BLOOD LEFT ANTECUBITAL  Final   Special Requests   Final    BOTTLES DRAWN AEROBIC AND ANAEROBIC Blood Culture adequate volume   Culture   Final    NO GROWTH 5 DAYS Performed at [facility], [address] [place], [place] [address]    Report Status 05/16/2020 FINAL  Final  Blood Culture (routine x 2)     Status: None   Collection Time: 05/11/20  7:58 AM   Specimen: BLOOD LEFT ARM  Result Value Ref Range Status   Specimen Description BLOOD LEFT ARM  Final   Special Requests   Final    BOTTLES DRAWN AEROBIC AND ANAEROBIC Blood Culture adequate volume   Culture   Final    NO GROWTH 5 DAYS Performed at [facility], [address], [place], [place] [address]    Report Status 05/16/2020 FINAL  Final         Radiology Studies: [facility] Chest Port 1 View  Result Date: 05/18/2020 CLINICAL DATA:  54 year old male recently diagnosed with COVID-19. Shortness of breath. EXAM: PORTABLE CHEST 1 VIEW COMPARISON:  CTA chest 05/11/2020 and earlier. FINDINGS: Portable AP upright view at 0724 hours. Lower lung volumes. Patchy and indistinct peripheral and asymmetric basilar pulmonary opacity, typical for COVID-19 pneumonia. Stable cardiac size and mediastinal contours. Visualized tracheal air column is within normal limits. No pneumothorax or pleural effusion. No acute osseous abnormality identified. IMPRESSION: Lower lung volumes with patchy and indistinct bilateral pulmonary opacity typical of COVID-19 pneumonia. Electronically Signed   By: Genevie Ann M.D.   On: 05/18/2020 07:45        Scheduled Meds: . vitamin C  500 mg Oral Daily   . aspirin EC  81 mg Oral Daily  . enoxaparin (LOVENOX) injection  0.5 mg/kg Subcutaneous Q24H  . furosemide  40 mg Intravenous Once  . gabapentin  600 mg Oral TID  . ipratropium  2 puff Inhalation Q4H  . isosorbide mononitrate  15 mg Oral Daily  . loratadine  10 mg Oral Daily  . losartan  100 mg Oral Daily  . methylPREDNISolone (SOLU-MEDROL) injection  60  mg Intravenous Q12H  . metoprolol succinate  25 mg Oral Daily  . zinc sulfate  220 mg Oral Daily   Continuous Infusions: . remdesivir 100 mg in NS 100 mL 100 mg (05/19/20 1046)     LOS: 1 day    Time spent: 28 minutes    Sharen Hones, MD [facility]   To contact the attending provider between 7A-7P or the covering provider during after hours 7P-7A, please log into the web site [email or web address] and access using universal [place] password for that web site. If you do not have the password, please call the hospital operator.  05/19/2020, 11:00 AM

## 2020-05-20 DIAGNOSIS — R778 Other specified abnormalities of plasma proteins: Secondary | ICD-10-CM | POA: Diagnosis not present

## 2020-05-20 DIAGNOSIS — J1282 Pneumonia due to coronavirus disease 2019: Secondary | ICD-10-CM | POA: Diagnosis not present

## 2020-05-20 DIAGNOSIS — U071 COVID-19: Secondary | ICD-10-CM | POA: Diagnosis not present

## 2020-05-20 DIAGNOSIS — J9600 Acute respiratory failure, unspecified whether with hypoxia or hypercapnia: Secondary | ICD-10-CM | POA: Diagnosis not present

## 2020-05-20 LAB — CBC WITH DIFFERENTIAL/PLATELET
Abs Immature Granulocytes: 0.34 10*3/uL — ABNORMAL HIGH (ref 0.00–0.07)
Basophils Absolute: 0 10*3/uL (ref 0.0–0.1)
Basophils Relative: 0 %
Eosinophils Absolute: 0 10*3/uL (ref 0.0–0.5)
Eosinophils Relative: 0 %
HCT: 47.1 % (ref 39.0–52.0)
Hemoglobin: 15.8 g/dL (ref 13.0–17.0)
Immature Granulocytes: 3 %
Lymphocytes Relative: 13 %
Lymphs Abs: 1.6 10*3/uL (ref 0.7–4.0)
MCH: 30.5 pg (ref 26.0–34.0)
MCHC: 33.5 g/dL (ref 30.0–36.0)
MCV: 90.9 fL (ref 80.0–100.0)
Monocytes Absolute: 0.6 10*3/uL (ref 0.1–1.0)
Monocytes Relative: 5 %
Neutro Abs: 9.8 10*3/uL — ABNORMAL HIGH (ref 1.7–7.7)
Neutrophils Relative %: 79 %
Platelets: 417 10*3/uL — ABNORMAL HIGH (ref 150–400)
RBC: 5.18 MIL/uL (ref 4.22–5.81)
RDW: 12.4 % (ref 11.5–15.5)
WBC: 12.3 10*3/uL — ABNORMAL HIGH (ref 4.0–10.5)
nRBC: 0 % (ref 0.0–0.2)

## 2020-05-20 LAB — PROCALCITONIN: Procalcitonin: 0.28 ng/mL

## 2020-05-20 LAB — COMPREHENSIVE METABOLIC PANEL
ALT: 67 U/L — ABNORMAL HIGH (ref 0–44)
AST: 35 U/L (ref 15–41)
Albumin: 2.9 g/dL — ABNORMAL LOW (ref 3.5–5.0)
Alkaline Phosphatase: 66 U/L (ref 38–126)
Anion gap: 11 (ref 5–15)
BUN: 33 mg/dL — ABNORMAL HIGH (ref 6–20)
CO2: 31 mmol/L (ref 22–32)
Calcium: 8.2 mg/dL — ABNORMAL LOW (ref 8.9–10.3)
Chloride: 99 mmol/L (ref 98–111)
Creatinine, Ser: 1.13 mg/dL (ref 0.61–1.24)
GFR, Estimated: >60 mL/min (ref >60)
Glucose, Bld: 180 mg/dL — ABNORMAL HIGH (ref 70–99)
Potassium: 4.2 mmol/L (ref 3.5–5.1)
Sodium: 141 mmol/L (ref 135–145)
Total Bilirubin: 0.9 mg/dL (ref 0.3–1.2)
Total Protein: 6.7 g/dL (ref 6.5–8.1)

## 2020-05-20 LAB — FIBRIN DERIVATIVES D-DIMER (ARMC ONLY): Fibrin derivatives D-dimer (ARMC): 350.58 ng/mL (FEU) (ref 0.00–499.00)

## 2020-05-20 LAB — MAGNESIUM: Magnesium: 2.6 mg/dL — ABNORMAL HIGH (ref 1.7–2.4)

## 2020-05-20 LAB — FERRITIN: Ferritin: 275 ng/mL (ref 24–336)

## 2020-05-20 LAB — C-REACTIVE PROTEIN: CRP: 0.7 mg/dL (ref <1.0)

## 2020-05-20 MED ORDER — ASCORBIC ACID 500 MG PO TABS: 500.0000 mg | ORAL_TABLET | Freq: Every day | ORAL | 0 refills | Status: AC

## 2020-05-20 MED ORDER — ZINC SULFATE 220 (50 ZN) MG PO CAPS: 220.0000 mg | ORAL_CAPSULE | Freq: Every day | ORAL | 0 refills | Status: AC

## 2020-05-20 NOTE — Progress Notes (Signed)
SATURATION QUALIFICATIONS: (This note is used to comply with regulatory documentation for home oxygen)  Patient Saturations on Room Air at Rest = 86%  Patient Saturations on Room Air while Ambulating = 85%  Patient Saturations on 2 Liters of oxygen while Ambulating = 90%  Please briefly explain why patient needs home oxygen:

## 2020-05-20 NOTE — Progress Notes (Signed)
[employer] representative delivered portable O2 tank to the pt's doorway to his room; [employer] will deliver concentrator to the pt's home this evening; verbally advised pt if his portable O2 tank gets low and his home concentrator has not been delivered to call 911 to advise them of O2 supply; no further questions voiced at this time; pt's discharge pending arrival of his ride home at the [facility] entrance

## 2020-05-20 NOTE — Progress Notes (Signed)
Pt's ride [facility] the medical mall entrance; pt discharged via wheelchair by nursing on 2L O2 portable  to the medical mall entrance

## 2020-05-20 NOTE — Discharge Summary (Signed)
Physician Discharge Summary  Patient ID: Aaron [name] MRN: [id number] DOB/AGE: 1965-09-14 54 y.o.  Admit date: 05/18/2020 Discharge date: 05/20/2020  Admission Diagnoses:  Discharge Diagnoses:  Principal Problem:   Pneumonia due to COVID-19 virus Active Problems:   Essential hypertension   Unspecified atrial fibrillation ([place])   Acute respiratory failure due to COVID-19 ([place])   Hypokalemia   Elevated troponin   Discharged Condition: good  Hospital Course:  Aaron Dilauro Hutchinsis a 54 y.o.malewith medical history significant ofhypertension, atrial fibrillation on anticoagulants, chronic back pain, obesity, depression, anxiety, ADD, who presents with shortness breath. Patient was recently hospitalized from 12/9-10/10 due to COVID-19 pneumonia.Pt wastreated with remdesivir in hospital and discharged on tapering dose of prednisone. Patient received monoclonal antibody infusion as outpatient. At this admission, patient is started on 3 L oxygen, remdesivir were restarted, started IV steroids.   #1.  Acute respiratory failure with hypoxemia secondary to Covid pneumonia. Multifocal pneumonia secondary to COVID-19 infection. Patient had a recent hospitalization due to Covid, at that time, he did not need oxygen.  He also received 3 days of remdesivir. He received IV steroids, complete remaining 2 doses of remdesivir. Patient currently had a mild elevation of BNP, received 1 dose of IV Lasix. He is currently on 2 L oxygen, will obtain home oxygen evaluation and proper oxygen if needed.  #2.  Paroxysmal atrial fibrillation. Not chronically on anticoagulation.  3.  Hypokalemia. Potassium normalized.  #4.  Mild elevation of troponin. Secondary to Covid with demand ischemia.  5.  Reactive thrombocytosis. Secondary to Covid.  #6 essential hypertension. Continue home medicines.    Consults: None  Significant Diagnostic Studies:  Treatments: Steroids and  remdesivir  Discharge Exam: Blood pressure (!) 137/96, pulse (!) 53, temperature 98.5 F (36.9 C), resp. rate 20, height 5' 9"  (1.753 m), weight 132.9 kg, SpO2 94 %. General appearance: alert and cooperative Resp: clear to auscultation bilaterally Cardio: regular rate and rhythm, S1, S2 normal, no murmur, click, rub or gallop GI: soft, non-tender; bowel sounds normal; no masses,  no organomegaly Extremities: extremities normal, atraumatic, no cyanosis or edema  Disposition: Discharge disposition: 01-Home or Self Care       Discharge Instructions    Diet - low sodium heart healthy   Complete by: As directed    Increase activity slowly   Complete by: As directed      Allergies as of 05/20/2020   No Known Allergies     Medication List    STOP taking these medications   ibuprofen 200 MG tablet Commonly known as: ADVIL     TAKE these medications   acetaminophen 325 MG tablet Commonly known as: TYLENOL Take 650-975 mg by mouth every 6 (six) hours as needed (for pain.).   albuterol 108 (90 Base) MCG/ACT inhaler Commonly known as: VENTOLIN HFA Inhale 2 puffs into the lungs every 6 (six) hours for 7 days.   ascorbic acid 500 MG tablet Commonly known as: VITAMIN C Take 1 tablet (500 mg total) by mouth daily for 14 days. Start taking on: May 21, 2020   aspirin EC 81 MG tablet Take 1 tablet (81 mg total) by mouth daily. Swallow whole.   benzonatate 100 MG capsule Commonly known as: TESSALON Take 2 capsules (200 mg total) by mouth every 8 (eight) hours.   celecoxib 100 MG capsule Commonly known as: CELEBREX Take 1 capsule (100 mg total) by mouth daily as needed.   cetirizine 10 MG tablet Commonly known as: ZYRTEC Take 1  tablet (10 mg total) by mouth daily as needed (allergies.).   chlorpheniramine-HYDROcodone 10-8 MG/5ML Suer Commonly known as: TUSSIONEX Take 5 mLs by mouth every 12 (twelve) hours as needed for up to 7 days for cough.   cloNIDine 0.1  mg/24hr patch Commonly known as: CATAPRES - Dosed in mg/24 hr Place 0.1 mg onto the skin every Sunday.   fluticasone 50 MCG/ACT nasal spray Commonly known as: FLONASE Place 2 sprays into both nostrils daily as needed (allergies.).   gabapentin 600 MG tablet Commonly known as: NEURONTIN Take 600 mg by mouth 3 (three) times daily.   isosorbide mononitrate 30 MG 24 hr tablet Commonly known as: IMDUR Take 0.5 tablets (15 mg total) by mouth daily.   losartan 100 MG tablet Commonly known as: COZAAR Take 1 tablet (100 mg total) by mouth daily.   metoprolol succinate 50 MG 24 hr tablet Commonly known as: TOPROL-XL Take 0.5 tablets (25 mg total) by mouth daily. Take with or immediately following a meal.   montelukast 10 MG tablet Commonly known as: SINGULAIR Take 1 tablet (10 mg total) by mouth at bedtime as needed (allergies.).   predniSONE 10 MG tablet Commonly known as: DELTASONE Take 6 pills on day 1, 5 pills on day 2 and so on until complete.   zinc sulfate 220 (50 Zn) MG capsule Take 1 capsule (220 mg total) by mouth daily for 14 days. Start taking on: May 21, 2020            Durable Medical Equipment  (From admission, onward)         Start     Ordered   05/20/20 0949  For home use only DME oxygen  Once       Question Answer Comment  Length of Need 6 Months   Mode or (Route) Nasal cannula   Liters per Minute 2   Oxygen conserving device Yes   Oxygen delivery system Gas      12 /18/21 8403          Follow-up Information    Trinna Post, PA-C Follow up in 1 week(s).   Specialty: Physician Assistant Contact information: [address] [place] [address] [phone]        Nelva Bush, MD .   Specialty: Cardiology Contact information: [place] [address] [phone]               Signed: Sharen Hones 05/20/2020, 9:51 AM

## 2020-05-20 NOTE — TOC Initial Note (Signed)
Transition of Care [facility]) - Initial/Assessment Note    Patient Details  Name: Aaron [name] MRN: [id number] Date of Birth: [birth year]  [facility]) CM/SW Contact:    Truitt Merle, LCSW Phone Number: 05/20/2020, 2:19 PM  Clinical Narrative:                 CSW spoke to patient via phone due to Airborne Isolation Status. Patient currently staying with his fiance at [address], [place]. [place], [place], [place] [address]. PCP is Carles Collet, PA-C with [facility]. [facility] [address] [facility]. Patient is new home oxygen. Patient agreeable to home oxygen, with no preference. CSW spoke with Lucretia at preferred vendor [employer] to arrange home O2. Confirmed delivery for today. Dr. Roosevelt Locks and RN Bailey Mech updated. Patient confirmed he has access to medication and fiance will be providing transportation home. Patient denied additional needs at this time and was encouraged to reach out if needs arise.  Expected Discharge Plan: [place] Barriers to Discharge: No Barriers Identified   Patient Goals and CMS Choice   CMS Medicare.gov Compare Post Acute Care list provided to:: Patient Choice offered to / list presented to : Patient  Expected Discharge Plan and Services Expected Discharge Plan: [place] In-house Referral: Clinical Social Work Discharge Planning Services: CM Consult Post Acute Care Choice: Durable Medical Equipment Living arrangements for the past 2 months: Apartment Expected Discharge Date: 05/20/20               DME Arranged: Oxygen DME Agency: [employer] Date DME Agency Contacted: 05/20/20 Time DME Agency Contacted: [phone] Representative spoke with at DME Agency: [phone]            Prior Living Arrangements/Services Living arrangements for the past 2 months: Apartment Lives with:: Significant Other (Fiance) Patient language and need for interpreter reviewed:: No Do you feel safe going back  to the place where you live?: Yes      Need for Family Participation in Patient Care: No (Comment) Care giver support system in place?: Yes (comment) Current home services: DME Criminal Activity/Legal Involvement Pertinent to Current Situation/Hospitalization: No - Comment as needed  Activities of Daily Living Home Assistive Devices/Equipment: None ADL Screening (condition at time of admission) Patient's cognitive ability adequate to safely complete daily activities?: Yes Is the patient deaf or have difficulty hearing?: No Does the patient have difficulty seeing, even when wearing glasses/contacts?: No Does the patient have difficulty concentrating, remembering, or making decisions?: No Patient able to express need for assistance with ADLs?: Yes Does the patient have difficulty dressing or bathing?: No Independently performs ADLs?: Yes (appropriate for developmental age) Does the patient have difficulty walking or climbing stairs?: No Weakness of Legs: None Weakness of Arms/Hands: None  Permission Sought/Granted Permission sought to share information with : Case Manager Permission granted to share information with : Yes, Verbal Permission Granted     Permission granted to share info w AGENCY: [employer]        Emotional Assessment Appearance:: Other (Comment Required (Unable to assess. Assessment via phone due to isolation.) Attitude/Demeanor/Rapport: Engaged Affect (typically observed): Calm Orientation: : Oriented to Self,Oriented to Place,Oriented to  Time,Oriented to Situation Alcohol / Substance Use: Never Used Psych Involvement: No (comment)  Admission diagnosis:  SOB (shortness of breath) [R06.02] Acute respiratory failure with hypoxia ([place]) [J96.01] Pneumonia due to COVID-19 virus [U07.1, J12.82] Patient Active Problem List   Diagnosis Date Noted  . Hypokalemia 05/18/2020  .  Elevated troponin 05/18/2020  . Pneumonia due to COVID-19 virus 05/11/2020  . Unspecified atrial  fibrillation ([place]) 05/11/2020  . Acute respiratory failure due to COVID-19 ([place]) 05/11/2020  . Palpitations 03/31/2020  . Abnormal EKG 03/31/2020  . Shortness of breath 03/31/2020  . Chest pain 12/17/2019  . Amaurosis fugax 12/17/2019  . MDD (major depressive disorder), single episode, moderate ([place]) 11/19/2018  . Bereavement 11/19/2018  . GAD (generalized anxiety disorder) 11/19/2018  . 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 ADD (attention deficit disorder) 09/02/2018  . Essential hypertension 09/02/2018  . Kidney stones 09/02/2018  . Murmur 09/02/2018  . Morbid obesity ([place]) 05/21/2018  . Morbid obesity with BMI of 45.0-49.9, adult ([place]) 09/20/2015   PCP:  Trinna Post, PA-C [id number] [place] [place] [id number]: [phone]: [phone]    Social Determinants of Health (SDOH) Interventions    Readmission Risk Interventions No flowsheet data found.

## 2020-05-20 NOTE — Plan of Care (Signed)

## 2020-05-20 NOTE — Progress Notes (Signed)
MD order received in [facility] to discharge pt home with home oxygen today; TOC previously established home oxygen with [employer]; verbally reviewed AVS with pt including [facility] [address] in [place], [place]; no questions voiced at this time; pt's discharge pending arrival of home oxygen delivered by [employer]

## 2020-05-20 NOTE — Discharge Instructions (Signed)
[employer] Home Oxygen 

## 2020-05-22 ENCOUNTER — Telehealth: Payer: Self-pay

## 2020-05-22 ENCOUNTER — Telehealth: Payer: Self-pay | Admitting: Internal Medicine

## 2020-05-22 NOTE — Telephone Encounter (Signed)
I agree with ongoing PCP follow-up and deferring cath until his breathing he has recovered as long as he does not have worsening cardiac symptoms.  Nelva Bush, MD [facility]

## 2020-05-22 NOTE — Telephone Encounter (Signed)
Patient fiance calling  States that he is still on oxygen and will need to reschedule his upcoming cath procedure Please call to discuss

## 2020-05-22 NOTE — Telephone Encounter (Signed)
Spoke with Collie Siad, ok per DPR. Patient is still on 2.5 L oxygen all the time. He is going to be seeing his PCP soon. They would like to postpone the cath until patient recovers a little more.  He is not having worsening chest pain or shortness of breath at this time. She will keep [place] updated with patient's status and call back to reschedule.  Called scheduling and cancelled cath and covid test.  Routing to Dr End to make him aware and any further advice.

## 2020-05-22 NOTE — Telephone Encounter (Signed)
Copied from [place] [id number]. Topic: General - Other >> May 22, 2020  1:56 PM Celene Kras wrote: Reason for CRM: Pts fiancee called and is requesting to have a hosp f/u for pt. She states that the pt was diagnosed with covid on 05/10/20. [place] advise.

## 2020-05-22 NOTE — Telephone Encounter (Signed)
Transition Care Management Follow-up Telephone Call  Date of discharge and from where: [facility] on 05/20/20  How have you been since you were released from the hospital? Doing better, is on oxygen 2L continuously. Pt states he has not had any SOB. Last pulse ox reading was 94%. Pt is still coughing but not as bad. Sputum is clear. Pt states he is sleeping fine and appetite is normal. Declines pain, fever or n/v/d.   Any questions or concerns? No   Items Reviewed:  Did the pt receive and understand the discharge instructions provided? Yes   Medications obtained and verified? No, pt declined reveiwing however did verify starting Vitamin C and Zinc, stopped Ibuprofen.   Any new allergies since your discharge? No   Dietary orders reviewed? Yes  Do you have support at home? Yes   Other (ie: DME, Home Health, etc): D/c with oxygen.  Functional Questionnaire: (I = Independent and D = Dependent)  Bathing/Dressing- I   Meal Prep- I  Eating- I  Maintaining continence- I  Transferring/Ambulation- I  Managing Meds- I   Follow up appointments reviewed:    PCP Hospital f/u appt confirmed? Yes  scheduled to see Carles Collet on 05/31/20 @ 2:00 PM.  [facility] f/u appt confirmed? No, pt awaiting a call from cardiology to scheduled apt.   Are transportation arrangements needed? No   If their condition worsens, is the pt aware to call  their PCP or go to the ED? Yes  Was the patient provided with contact information for the PCP's office or ED? Yes  Was the pt encouraged to call back with questions or concerns? Yes

## 2020-05-22 NOTE — Telephone Encounter (Signed)
No HFU scheduled at this time.

## 2020-05-23 ENCOUNTER — Ambulatory Visit: Payer: Medicare Other | Admitting: Family

## 2020-05-23 LAB — CULTURE, BLOOD (ROUTINE X 2)
Culture: NO GROWTH
Culture: NO GROWTH
Special Requests: ADEQUATE
Special Requests: ADEQUATE

## 2020-05-29 ENCOUNTER — Other Ambulatory Visit: Payer: Medicare Other

## 2020-05-30 ENCOUNTER — Encounter: Admission: RE | Payer: Self-pay | Source: Home / Self Care

## 2020-05-30 ENCOUNTER — Ambulatory Visit: Admission: RE | Admit: 2020-05-30 | Payer: 59 | Source: Home / Self Care | Admitting: Internal Medicine

## 2020-05-30 DIAGNOSIS — R9439 Abnormal result of other cardiovascular function study: Secondary | ICD-10-CM

## 2020-05-30 SURGERY — LEFT HEART CATH AND CORONARY ANGIOGRAPHY
Anesthesia: Moderate Sedation | Laterality: Left

## 2020-05-31 ENCOUNTER — Inpatient Hospital Stay: Payer: 59 | Admitting: Physician Assistant

## 2020-06-05 ENCOUNTER — Telehealth (INDEPENDENT_AMBULATORY_CARE_PROVIDER_SITE_OTHER): Payer: Medicare Other | Admitting: Physician Assistant

## 2020-06-05 DIAGNOSIS — U071 COVID-19: Secondary | ICD-10-CM | POA: Diagnosis not present

## 2020-06-05 NOTE — Progress Notes (Addendum)
Virtual Video visit    Virtual Visit via Video Note   This visit type was conducted due to national recommendations for restrictions regarding the COVID-19 Pandemic (e.g. social distancing) in an effort to limit this patient's exposure and mitigate transmission in our community. Due to his co-morbid illnesses, this patient is at least at moderate risk for complications without adequate follow up. This format is felt to be most appropriate for this patient at this time. The patient did not have access to video technology or had technical difficulties with video requiring transitioning to audio format only (telephone). > 50% of the time was spent via video. Physical exam was limited to content and character of the telephone converstion.    Patient location: Home Provider location: Office   I discussed the limitations of evaluation and management by telemedicine and the availability of in person appointments. The patient expressed understanding and agreed to proceed.   Visit Date: 06/05/2020  Today's healthcare provider: Trinna Post, PA-C   No chief complaint on file.  Subjective    HPI  Follow up Hospitalization  Patient was admitted to [facility] on 05/18/20 and discharged on 05/25/20. He was treated for covid. Treatment for this included monoclonal infusion. Telephone follow up was done on 06/05/20. He reports excellent compliance with treatment ( vitamin C, zinc, inhaler, oxygen, steroids). He reports this condition is improved.  ----------------------------------------------------------------------------------------- [facility] f/u from sob as a result of covid. Pt tested positive for covid 05/11/20. This was his second admission for COVID. He was treated with remdesivir and discharged on 2L oxygen. Reports he is off oxygen and saturations are mid 90s.        Medications: Outpatient Medications Prior to Visit  Medication Sig  . acetaminophen (TYLENOL) 325 MG tablet  Take 650-975 mg by mouth every 6 (six) hours as needed (for pain.).  Marland Kitchen albuterol (VENTOLIN HFA) 108 (90 Base) MCG/ACT inhaler Inhale 2 puffs into the lungs every 6 (six) hours for 7 days.  Marland Kitchen aspirin EC 81 MG tablet Take 1 tablet (81 mg total) by mouth daily. Swallow whole.  . benzonatate (TESSALON) 100 MG capsule Take 2 capsules (200 mg total) by mouth every 8 (eight) hours.  . celecoxib (CELEBREX) 100 MG capsule Take 1 capsule (100 mg total) by mouth daily as needed.  . cetirizine (ZYRTEC) 10 MG tablet Take 1 tablet (10 mg total) by mouth daily as needed (allergies.).  Marland Kitchen cloNIDine (CATAPRES - DOSED IN MG/24 HR) 0.1 mg/24hr patch Place 0.1 mg onto the skin every Sunday.   . fluticasone (FLONASE) 50 MCG/ACT nasal spray Place 2 sprays into both nostrils daily as needed (allergies.).  Marland Kitchen gabapentin (NEURONTIN) 600 MG tablet Take 600 mg by mouth 3 (three) times daily.  . isosorbide mononitrate (IMDUR) 30 MG 24 hr tablet Take 0.5 tablets (15 mg total) by mouth daily.  Marland Kitchen losartan (COZAAR) 100 MG tablet Take 1 tablet (100 mg total) by mouth daily.  . metoprolol succinate (TOPROL-XL) 50 MG 24 hr tablet Take 0.5 tablets (25 mg total) by mouth daily. Take with or immediately following a meal.  . montelukast (SINGULAIR) 10 MG tablet Take 1 tablet (10 mg total) by mouth at bedtime as needed (allergies.).  Marland Kitchen predniSONE (DELTASONE) 10 MG tablet Take 6 pills on day 1, 5 pills on day 2 and so on until complete.   No facility-administered medications prior to visit.    Review of Systems  Constitutional: Positive for unexpected weight change. Negative for  chills and fever.       Lost 20 lbs with covid infection.  Respiratory: Negative for apnea, cough, choking, chest tightness, shortness of breath, wheezing and stridor.   Cardiovascular: Negative for chest pain.       Objective    There were no vitals taken for this visit.    No respiratory distress.    Assessment & Plan    1. COVID-19  Doing  better, off oxygen. Follow up CXR in 4 weeks to ensure resolution.    Return if symptoms worsen or fail to improve.    I discussed the assessment and treatment plan with the patient. The patient was provided an opportunity to ask questions and all were answered. The patient agreed with the plan and demonstrated an understanding of the instructions.   The patient was advised to call back or seek an in-person evaluation if the symptoms worsen or if the condition fails to improve as anticipated.   ITrinna Post, PA-C, have reviewed all documentation for this visit. The documentation on 06/14/20 for the exam, diagnosis, procedures, and orders are all accurate and complete.  The entirety of the information documented in the History of Present Illness, Review of Systems and Physical Exam were personally obtained by me. Portions of this information were initially documented by Sylvester Harder, CMA and reviewed by me for thoroughness and accuracy.       Paulene Floor [facility] [phone] (phone) [phone] (fax)  [employer]

## 2020-06-15 ENCOUNTER — Encounter: Payer: Self-pay | Admitting: Family

## 2020-06-15 ENCOUNTER — Ambulatory Visit (INDEPENDENT_AMBULATORY_CARE_PROVIDER_SITE_OTHER): Payer: Medicare Other | Admitting: Family

## 2020-06-15 ENCOUNTER — Other Ambulatory Visit: Payer: Self-pay

## 2020-06-15 ENCOUNTER — Ambulatory Visit: Payer: Self-pay | Admitting: Physician Assistant

## 2020-06-15 VITALS — BP 150/104 | HR 92 | Ht 69.0 in | Wt 302.0 lb

## 2020-06-15 DIAGNOSIS — I10 Essential (primary) hypertension: Secondary | ICD-10-CM

## 2020-06-15 DIAGNOSIS — I491 Atrial premature depolarization: Secondary | ICD-10-CM

## 2020-06-15 DIAGNOSIS — R9439 Abnormal result of other cardiovascular function study: Secondary | ICD-10-CM

## 2020-06-15 DIAGNOSIS — R0600 Dyspnea, unspecified: Secondary | ICD-10-CM

## 2020-06-15 DIAGNOSIS — R9431 Abnormal electrocardiogram [ECG] [EKG]: Secondary | ICD-10-CM

## 2020-06-15 DIAGNOSIS — Z0279 Encounter for issue of other medical certificate: Secondary | ICD-10-CM

## 2020-06-15 DIAGNOSIS — R079 Chest pain, unspecified: Secondary | ICD-10-CM

## 2020-06-15 DIAGNOSIS — U071 COVID-19: Secondary | ICD-10-CM

## 2020-06-15 DIAGNOSIS — R0609 Other forms of dyspnea: Secondary | ICD-10-CM

## 2020-06-15 NOTE — H&P (View-Only) (Signed)
Office Visit    Patient Name: Aaron [name] Date of Encounter: 06/15/2020  Primary Care Provider:  Trinna Post, PA-C Primary Cardiologist:  Nelva Bush, MD Electrophysiologist:  None   Chief Complaint    Aaron [name] is a 55 y.o. male with a hx of HTN, kdiney stones, chronic back pain, depression, morbid obesity, first degree AV block, abnormal EKG  presents today for follow-up after reduced dose of metoprolol due to lightheadedness.  Past Medical History    Past Medical History:  Diagnosis Date  . A-fib ([place])   . Chronic back pain    disc  . Hypertension   . Kidney stone   . MDD (major depressive disorder)    Past Surgical History:  Procedure Laterality Date  . KIDNEY STONE SURGERY    . VASECTOMY      Allergies  No Known Allergies  History of Present Illness    Aaron [name] is a 55 y.o. male with a hx of HTN, kdiney stones, chronic back pain, depression, morbid obesity, first degree AV block, abnormal EKG last seen 03/30/2020 by Dr. Saunders Revel  Stress test 01/04/20 with possible area of ischemia involving inferolateral wall. Discussed with Dr. Saunders Revel via telephone and requested to proceed with medical therapy and close outpatient follow up. He was started on Toprol 40m daily.  He had subsequent carotid duplex and echocardiogram.  Echo 01/19/2020 LVEF 55 to 60 percent, no wall motion abnormalities, grade 1 diastolic dysfunction, LA moderately dilated, mild MR.  Carotid duplex 01/19/2020 without evidence of thrombus, dissection, atherosclerotic plaque or stenosis in bilateral carotid arteries.  Lipid panel 11/17/19 with total cholesterol 117, HDL 31, triglycerides 119, LDL 64.   Seen in clinic 01/21/20. Noted stable DOE. He also noted palpitations which had improved somewhat since addition of Metoprolol. Imdur was added to his medication regimen for elevated BP.  He was seen in follow-up 02/11/2020 with palpitations improved though still occurring and as such ZIO  monitoring was ordered.  ZIO showed predominantly normal sinus rhythm with a frequent PAC with burden of 13% and 42 atrial runs with longest 11 beats and up to 164 bpm.  Metoprolol was increased to 50 mg daily.  When seen in follow-up 03/22/2020 he noted some dizziness and he had marked first-degree AV block and as such metoprolol was reduced to 25 mg daily.  Noted he may require referral to EP for antiarrhythmic guidance.  He was recommended to consider cardiac catheterization as his myocardial perfusion stress test was inter immediate risk with possible inferolateral ischemia versus infarct.  He was seen 04/26/2020 recommended for cardiac catheterization which was scheduled.  However, he has had to be delayed due to COVID-19 infection.  He was diagnosed 05/10/2020.  He was hospitalized 05/11/2020 - 05/12/2020 and again 12/16-12/18/21.  He was treated with IV remdesivir and steroids.  He was discharged on home oxygen and tells me he has been able to wean off of it they did needed a few days ago.  Denies chest pain, pressure, tightness.  Reports shortness of breath and dyspnea on exertion.  Endorses continued lightheadedness.  Most noticeable with activity.  No near syncope no syncope.  Continued palpitations.  Overall feels very fatigued, short of breath, still with some congestion.  Is hopeful to antibiotic that his ENT prescribed yesterday will provide relief.  EKGs/Labs/Other Studies Reviewed:   The following studies were reviewed today:  Lexiscan Myoview 01/04/20 Abnormal pharmacologic myocardial perfusion stress test. There is a moderate in size,  mild in severity, reversible defect involving the inferolateral myocardium that may reflect subtle ischemia but cannot rule out artifact. Left ventricular systolic function is low normal to mildly reduced by visual estimation and Siemens calculation (51%). QGS calculation of 35% is likely artificially low due to gating parameters. There is no significant  coronary artery calcification on the attenuation correction CT. Sensitivity and specificity of this study are degraded by body habitus. This is an intermediate risk study.  Carotid Doppler 01/19/20 Summary:  Right Carotid: There was no evidence of thrombus, dissection,  atherosclerotic plaque or stenosis in the cervical carotid system.   Left Carotid: There was no evidence of thrombus, dissection,  atherosclerotic plaque or stenosis in the cervical carotid system.   Echo 01/19/20  1. Left ventricular ejection fraction, by estimation, is 55 to 60%. The  left ventricle has normal function. The left ventricle has no regional  wall motion abnormalities. Left ventricular diastolic parameters are  consistent with Grade I diastolic  dysfunction (impaired relaxation).   2. Right ventricular systolic function is normal. The right ventricular  size is normal.   3. Left atrial size was moderately dilated.   4. Mild mitral valve regurgitation.   EKG:  No EKG is ordered today.  The ekg independently reviewed 03/30/2020 demonstrated demonstrates SR 77 bpm with 1st degree AV block (PR 296). Noted Prolonged QTc and stable TWI in anterolateral leads.  Recent Labs: 05/19/2020: B Natriuretic Peptide 181.7 05/20/2020: ALT 67; BUN 33; Creatinine, Ser 1.13; Hemoglobin 15.8; Magnesium 2.6; Platelets 417; Potassium 4.2; Sodium 141  Recent Lipid Panel    Component Value Date/Time   CHOL 96 05/19/2020 0500   CHOL 117 11/17/2019 1003   TRIG 70 05/19/2020 0500   HDL 27 (L) 05/19/2020 0500   HDL 31 (L) 11/17/2019 1003   CHOLHDL 3.6 05/19/2020 0500   VLDL 14 05/19/2020 0500   LDLCALC 55 05/19/2020 0500   LDLCALC 64 11/17/2019 1003    Home Medications   Current Meds  Medication Sig  . acetaminophen (TYLENOL) 325 MG tablet Take 650-975 mg by mouth every 6 (six) hours as needed (for pain.).  Marland Kitchen aspirin EC 81 MG tablet Take 1 tablet (81 mg total) by mouth daily. Swallow whole.  . benzonatate (TESSALON)  100 MG capsule Take 2 capsules (200 mg total) by mouth every 8 (eight) hours.  . celecoxib (CELEBREX) 100 MG capsule Take 1 capsule (100 mg total) by mouth daily as needed.  . cetirizine (ZYRTEC) 10 MG tablet Take 1 tablet (10 mg total) by mouth daily as needed (allergies.).  Marland Kitchen fluticasone (FLONASE) 50 MCG/ACT nasal spray Place 2 sprays into both nostrils daily as needed (allergies.).  Marland Kitchen gabapentin (NEURONTIN) 600 MG tablet Take 600 mg by mouth 3 (three) times daily.  . isosorbide mononitrate (IMDUR) 30 MG 24 hr tablet Take 0.5 tablets (15 mg total) by mouth daily.  Marland Kitchen losartan (COZAAR) 100 MG tablet Take 1 tablet (100 mg total) by mouth daily.  . metoprolol succinate (TOPROL-XL) 50 MG 24 hr tablet Take 0.5 tablets (25 mg total) by mouth daily. Take with or immediately following a meal.  . montelukast (SINGULAIR) 10 MG tablet Take 1 tablet (10 mg total) by mouth at bedtime as needed (allergies.).     Review of Systems    All other systems reviewed and are otherwise negative except as noted above.  Physical Exam    VS:  BP (!) 150/104   Pulse 92   Ht 5' 9"  (1.753 m)  Wt (!) 302 lb (137 kg)   BMI 44.60 kg/m  , BMI Body mass index is 44.6 kg/m. GEN: Well nourished, overweight, well developed, in no acute distress. HEENT: normal. Neck: Supple, no carotid bruits or masses. Cardiac: RRR, no murmurs, rubs, or gallops. No clubbing, cyanosis, edema.  Radials/PT 2+ and equal bilaterally.  Respiratory:  Respirations regular and unlabored, clear to auscultation bilaterally. GI: Soft, nontender, nondistended. MS: No deformity or atrophy. Skin: Warm and dry, no rash. Neuro:  Strength and sensation are intact. Psych: Normal affect.  Assessment & Plan   1. Atypical chest pain / DOE / Abnormal Lexiscan myoview - Lexiscan 01/2020 intermediate risk with moderate in size, mild in severity reversible inferolateral defect subtle ischemia vs artifact. Due to potential indication for antiarrythmic  therapy in setting of symptomatic PACs, dyspnea on exertion, atypical chest pain - plan for cardiac catheterization. This was initially scheduled for 05/30/20 but had to be rescheduled due to respiratory illness. The patient understands that risks include but are not limited to stroke (1 in 1000), death (1 in 27), kidney failure [usually temporary] (1 in 500), bleeding (1 in 200), allergic reaction [possibly serious] (1 in 200), and agrees to proceed. Present GDMT includes Aspirin, Imdur. Unable to further titrate Imdur due to lightheadedness. Lipid pane 11/17/19 with LDL 64, will defer addition of statin until results of cardiac cath indicate CAD. BMP, CBC ordered for pre-cath.   2. S/p COVID 19 -diagnosed 05/10/2020.  Ended 05/11/2020 - 05/12/2020 and again 05/18/2020 - 05/20/2020 due to pneumonia due to COVID-19 virus.  He received 3 days of remdesivir, IV steroids.  He was started on antibiotic by ENT yesterday as his shortness of breath has not resolved.  He has completed his quarantine and has can test positive for up to 90 days after initial test will not retest prior to cardiac catheterization.  3. Palpitations - Known PAC by previous monitor.  We have been unable to previously uptitrate his Toprol due to first-degree AV block and fatigue.  Consider referral to EP if symptoms persist. Continue present dose of Toprol 29m daily.   4. HTN -BP mildly elevated today though routinely well controlled.  Likely acute illness exacerbating BP. Will continue present antihypertensive regimen includingImdur 50 mg daily, losartan 100 mg daily, Toprol 25 mg daily. He will monitor BP at home. He has discontinued Clonidine 0.175mpatch for unclear reason. If BP remains elevated, will plan to resume.   5. Morbid obesity - BMI >40.  Weight loss via diet and exercise encouraged  Disposition: Cardiac catheterization 06/27/20.  Follow up 2 weeks after cardiac cath with Dr. EnSaunders Revelr APP.  Disability paperwork filled out today  and faxed for the patient.  We will mail him a copy.  Of note, will not recommend return to work until after he is seen for his 2-week follow-up after cardiac catheterization.  CaLoel DubonnetNP 06/15/2020, 4:48 PM

## 2020-06-15 NOTE — Progress Notes (Signed)
Office Visit    Patient Name: Aaron [name] Date of Encounter: 06/15/2020  Primary Care Provider:  Trinna Post, PA-C Primary Cardiologist:  Nelva Bush, MD Electrophysiologist:  None   Chief Complaint    Aaron [name] is a 55 y.o. male with a hx of HTN, kdiney stones, chronic back pain, depression, morbid obesity, first degree AV block, abnormal EKG  presents today for follow-up after reduced dose of metoprolol due to lightheadedness.  Past Medical History    Past Medical History:  Diagnosis Date  . A-fib ([place])   . Chronic back pain    disc  . Hypertension   . Kidney stone   . MDD (major depressive disorder)    Past Surgical History:  Procedure Laterality Date  . KIDNEY STONE SURGERY    . VASECTOMY      Allergies  No Known Allergies  History of Present Illness    Aaron [name] is a 55 y.o. male with a hx of HTN, kdiney stones, chronic back pain, depression, morbid obesity, first degree AV block, abnormal EKG last seen 03/30/2020 by Dr. Saunders Revel  Stress test 01/04/20 with possible area of ischemia involving inferolateral wall. Discussed with Dr. Saunders Revel via telephone and requested to proceed with medical therapy and close outpatient follow up. He was started on Toprol 69m daily.  He had subsequent carotid duplex and echocardiogram.  Echo 01/19/2020 LVEF 55 to 60 percent, no wall motion abnormalities, grade 1 diastolic dysfunction, LA moderately dilated, mild MR.  Carotid duplex 01/19/2020 without evidence of thrombus, dissection, atherosclerotic plaque or stenosis in bilateral carotid arteries.  Lipid panel 11/17/19 with total cholesterol 117, HDL 31, triglycerides 119, LDL 64.   Seen in clinic 01/21/20. Noted stable DOE. He also noted palpitations which had improved somewhat since addition of Metoprolol. Imdur was added to his medication regimen for elevated BP.  He was seen in follow-up 02/11/2020 with palpitations improved though still occurring and as such ZIO  monitoring was ordered.  ZIO showed predominantly normal sinus rhythm with a frequent PAC with burden of 13% and 42 atrial runs with longest 11 beats and up to 164 bpm.  Metoprolol was increased to 50 mg daily.  When seen in follow-up 03/22/2020 he noted some dizziness and he had marked first-degree AV block and as such metoprolol was reduced to 25 mg daily.  Noted he may require referral to EP for antiarrhythmic guidance.  He was recommended to consider cardiac catheterization as his myocardial perfusion stress test was inter immediate risk with possible inferolateral ischemia versus infarct.  He was seen 04/26/2020 recommended for cardiac catheterization which was scheduled.  However, he has had to be delayed due to COVID-19 infection.  He was diagnosed 05/10/2020.  He was hospitalized 05/11/2020 - 05/12/2020 and again 12/16-12/18/21.  He was treated with IV remdesivir and steroids.  He was discharged on home oxygen and tells me he has been able to wean off of it they did needed a few days ago.  Denies chest pain, pressure, tightness.  Reports shortness of breath and dyspnea on exertion.  Endorses continued lightheadedness.  Most noticeable with activity.  No near syncope no syncope.  Continued palpitations.  Overall feels very fatigued, short of breath, still with some congestion.  Is hopeful to antibiotic that his ENT prescribed yesterday will provide relief.  EKGs/Labs/Other Studies Reviewed:   The following studies were reviewed today:  Lexiscan Myoview 01/04/20 Abnormal pharmacologic myocardial perfusion stress test. There is a moderate in size,  mild in severity, reversible defect involving the inferolateral myocardium that may reflect subtle ischemia but cannot rule out artifact. Left ventricular systolic function is low normal to mildly reduced by visual estimation and Siemens calculation (51%). QGS calculation of 35% is likely artificially low due to gating parameters. There is no significant  coronary artery calcification on the attenuation correction CT. Sensitivity and specificity of this study are degraded by body habitus. This is an intermediate risk study.  Carotid Doppler 01/19/20 Summary:  Right Carotid: There was no evidence of thrombus, dissection,  atherosclerotic plaque or stenosis in the cervical carotid system.   Left Carotid: There was no evidence of thrombus, dissection,  atherosclerotic plaque or stenosis in the cervical carotid system.   Echo 01/19/20  1. Left ventricular ejection fraction, by estimation, is 55 to 60%. The  left ventricle has normal function. The left ventricle has no regional  wall motion abnormalities. Left ventricular diastolic parameters are  consistent with Grade I diastolic  dysfunction (impaired relaxation).   2. Right ventricular systolic function is normal. The right ventricular  size is normal.   3. Left atrial size was moderately dilated.   4. Mild mitral valve regurgitation.   EKG:  No EKG is ordered today.  The ekg independently reviewed 03/30/2020 demonstrated demonstrates SR 77 bpm with 1st degree AV block (PR 296). Noted Prolonged QTc and stable TWI in anterolateral leads.  Recent Labs: 05/19/2020: B Natriuretic Peptide 181.7 05/20/2020: ALT 67; BUN 33; Creatinine, Ser 1.13; Hemoglobin 15.8; Magnesium 2.6; Platelets 417; Potassium 4.2; Sodium 141  Recent Lipid Panel    Component Value Date/Time   CHOL 96 05/19/2020 0500   CHOL 117 11/17/2019 1003   TRIG 70 05/19/2020 0500   HDL 27 (L) 05/19/2020 0500   HDL 31 (L) 11/17/2019 1003   CHOLHDL 3.6 05/19/2020 0500   VLDL 14 05/19/2020 0500   LDLCALC 55 05/19/2020 0500   LDLCALC 64 11/17/2019 1003    Home Medications   Current Meds  Medication Sig  . acetaminophen (TYLENOL) 325 MG tablet Take 650-975 mg by mouth every 6 (six) hours as needed (for pain.).  Marland Kitchen aspirin EC 81 MG tablet Take 1 tablet (81 mg total) by mouth daily. Swallow whole.  . benzonatate (TESSALON)  100 MG capsule Take 2 capsules (200 mg total) by mouth every 8 (eight) hours.  . celecoxib (CELEBREX) 100 MG capsule Take 1 capsule (100 mg total) by mouth daily as needed.  . cetirizine (ZYRTEC) 10 MG tablet Take 1 tablet (10 mg total) by mouth daily as needed (allergies.).  Marland Kitchen fluticasone (FLONASE) 50 MCG/ACT nasal spray Place 2 sprays into both nostrils daily as needed (allergies.).  Marland Kitchen gabapentin (NEURONTIN) 600 MG tablet Take 600 mg by mouth 3 (three) times daily.  . isosorbide mononitrate (IMDUR) 30 MG 24 hr tablet Take 0.5 tablets (15 mg total) by mouth daily.  Marland Kitchen losartan (COZAAR) 100 MG tablet Take 1 tablet (100 mg total) by mouth daily.  . metoprolol succinate (TOPROL-XL) 50 MG 24 hr tablet Take 0.5 tablets (25 mg total) by mouth daily. Take with or immediately following a meal.  . montelukast (SINGULAIR) 10 MG tablet Take 1 tablet (10 mg total) by mouth at bedtime as needed (allergies.).     Review of Systems    All other systems reviewed and are otherwise negative except as noted above.  Physical Exam    VS:  BP (!) 150/104   Pulse 92   Ht 5' 9"  (1.753 m)  Wt (!) 302 lb (137 kg)   BMI 44.60 kg/m  , BMI Body mass index is 44.6 kg/m. GEN: Well nourished, overweight, well developed, in no acute distress. HEENT: normal. Neck: Supple, no carotid bruits or masses. Cardiac: RRR, no murmurs, rubs, or gallops. No clubbing, cyanosis, edema.  Radials/PT 2+ and equal bilaterally.  Respiratory:  Respirations regular and unlabored, clear to auscultation bilaterally. GI: Soft, nontender, nondistended. MS: No deformity or atrophy. Skin: Warm and dry, no rash. Neuro:  Strength and sensation are intact. Psych: Normal affect.  Assessment & Plan   1. Atypical chest pain / DOE / Abnormal Lexiscan myoview - Lexiscan 01/2020 intermediate risk with moderate in size, mild in severity reversible inferolateral defect subtle ischemia vs artifact. Due to potential indication for antiarrythmic  therapy in setting of symptomatic PACs, dyspnea on exertion, atypical chest pain - plan for cardiac catheterization. This was initially scheduled for 05/30/20 but had to be rescheduled due to respiratory illness. The patient understands that risks include but are not limited to stroke (1 in 1000), death (1 in 33), kidney failure [usually temporary] (1 in 500), bleeding (1 in 200), allergic reaction [possibly serious] (1 in 200), and agrees to proceed. Present GDMT includes Aspirin, Imdur. Unable to further titrate Imdur due to lightheadedness. Lipid pane 11/17/19 with LDL 64, will defer addition of statin until results of cardiac cath indicate CAD. BMP, CBC ordered for pre-cath.   2. S/p COVID 19 -diagnosed 05/10/2020.  Ended 05/11/2020 - 05/12/2020 and again 05/18/2020 - 05/20/2020 due to pneumonia due to COVID-19 virus.  He received 3 days of remdesivir, IV steroids.  He was started on antibiotic by ENT yesterday as his shortness of breath has not resolved.  He has completed his quarantine and has can test positive for up to 90 days after initial test will not retest prior to cardiac catheterization.  3. Palpitations - Known PAC by previous monitor.  We have been unable to previously uptitrate his Toprol due to first-degree AV block and fatigue.  Consider referral to EP if symptoms persist. Continue present dose of Toprol 42m daily.   4. HTN -BP mildly elevated today though routinely well controlled.  Likely acute illness exacerbating BP. Will continue present antihypertensive regimen includingImdur 50 mg daily, losartan 100 mg daily, Toprol 25 mg daily. He will monitor BP at home. He has discontinued Clonidine 0.137mpatch for unclear reason. If BP remains elevated, will plan to resume.   5. Morbid obesity - BMI >40.  Weight loss via diet and exercise encouraged  Disposition: Cardiac catheterization 06/27/20.  Follow up 2 weeks after cardiac cath with Dr. EnSaunders Revelr APP.  Disability paperwork filled out today  and faxed for the patient.  We will mail him a copy.  Of note, will not recommend return to work until after he is seen for his 2-week follow-up after cardiac catheterization.  CaLoel DubonnetNP 06/15/2020, 4:48 PM

## 2020-06-15 NOTE — Progress Notes (Signed)
Spoke with Mr. Aaron [name] and his wife to update it is Jan. 25, 2022 and that I will call tomorrow with time of procedure. They both verbalized understanding with no further questions at this time.

## 2020-06-15 NOTE — Patient Instructions (Addendum)
Medication Instructions:  No medication changes today.   *If you need a refill on your cardiac medications before your next appointment, please call your pharmacy*   Lab Work: Your physician recommends that you return for lab work 3 days prior to your cardiac catheterization: BMP, CBC  If you have labs (blood work) drawn today and your tests are completely normal, you will receive your results only by: Marland Kitchen MyChart Message (if you have MyChart) OR . A paper copy in the mail If you have any lab test that is abnormal or we need to change your treatment, we will call you to review the results.   Testing/Procedures: Your physician has requested that you have a cardiac catheterization. Cardiac catheterization is used to diagnose and/or treat various heart conditions  [facility] Cardiac Cath Instructions   You are scheduled for a Cardiac Cath on: June 27, 2020  Please arrive at _______am on the day of your procedure  Please expect a call from our [place] to pre-register you  Do not eat/drink anything after midnight  Someone will need to drive you home  It is recommended someone be with you for the first 24 hours after your procedure  Wear clothes that are easy to get on/off and wear slip on shoes if possible   Medications bring a current list of all medications with you  _XX__ You may take all of your medications the morning of your procedure with enough water to swallow safely  _XX__ Labs will need to be done on Friday January 21st at [facility] entrance and check in at registration. No appointment is needed for this.    Day of your procedure: Arrive at the [facility] entrance.  Free valet service is available.  After entering the [place] please check-in at the registration desk (1st desk on your right) to receive your armband. After receiving your armband someone will escort you to the cardiac cath/special procedures waiting area.  The usual  length of stay after your procedure is about 2 to 3 hours.  This can vary.  If you have any questions, please call our office at [phone], or you may call the cardiac cath lab at [facility] directly at [phone]     Follow-Up: At [facility], you and your health needs are our priority.  As part of our continuing mission to provide you with exceptional heart care, we have created designated Provider Care Teams.  These Care Teams include your primary Cardiologist (physician) and Advanced Practice Providers (APPs -  Physician Assistants and Nurse Practitioners) who all work together to provide you with the care you need, when you need it.  We recommend signing up for the patient portal called "MyChart".  Sign up information is provided on this After Visit Summary.  MyChart is used to connect with patients for Virtual Visits (Telemedicine).  Patients are able to view lab/test results, encounter notes, upcoming appointments, etc.  Non-urgent messages can be sent to your provider as well.   To learn more about what you can do with MyChart, go to [email or web address].    Your next appointment:   2 week(s) after cardiac  The format for your next appointment:   In Person  Provider:   You may see Nelva Bush, MD or one of the following Advanced Practice Providers on your designated Care Team:    Murray Hodgkins, NP  Christell Faith, PA-C  Marrianne Mood, PA-C  Cadence Trinity, [place]  Laurann Montana, NP  Other Instructions

## 2020-06-16 ENCOUNTER — Telehealth: Payer: Self-pay | Admitting: Family

## 2020-06-16 NOTE — Addendum Note (Signed)
Addended by: Loel Dubonnet on: 06/16/2020 10:19 AM   Modules accepted: Orders, SmartSet

## 2020-06-16 NOTE — Telephone Encounter (Signed)
Spoke with patient and confirmed Left heart cath for January 25 th with arrival time of 08:30 AM and labs to be done the Friday before at the [facility] entrance. Reviewed that COVID swab is not necessary since he was positive back on 05/10/2020. Will update provider on procedure, date, time, and location. He was appreciative for the call with no further questions at this time.

## 2020-06-16 NOTE — Telephone Encounter (Signed)
Completed forms scanned and mailed original to patient .

## 2020-06-16 NOTE — Telephone Encounter (Signed)
Orders placed. Consent documented in clinic visit note 06/15/20.  Loel Dubonnet, NP

## 2020-06-26 ENCOUNTER — Other Ambulatory Visit
Admission: RE | Admit: 2020-06-26 | Discharge: 2020-06-26 | Disposition: A | Discharge status: Home / Self Care | Payer: Medicare Other | Source: Ambulatory Visit | Attending: Family | Admitting: Family

## 2020-06-26 ENCOUNTER — Other Ambulatory Visit: Payer: Self-pay

## 2020-06-26 ENCOUNTER — Telehealth: Payer: Self-pay | Admitting: Internal Medicine

## 2020-06-26 DIAGNOSIS — R0600 Dyspnea, unspecified: Secondary | ICD-10-CM | POA: Diagnosis present

## 2020-06-26 DIAGNOSIS — R9439 Abnormal result of other cardiovascular function study: Secondary | ICD-10-CM | POA: Diagnosis present

## 2020-06-26 DIAGNOSIS — R0609 Other forms of dyspnea: Secondary | ICD-10-CM

## 2020-06-26 LAB — CBC
HCT: 45.9 % (ref 39.0–52.0)
Hemoglobin: 15.5 g/dL (ref 13.0–17.0)
MCH: 30 pg (ref 26.0–34.0)
MCHC: 33.8 g/dL (ref 30.0–36.0)
MCV: 89 fL (ref 80.0–100.0)
Platelets: 347 10*3/uL (ref 150–400)
RBC: 5.16 MIL/uL (ref 4.22–5.81)
RDW: 12.5 % (ref 11.5–15.5)
WBC: 11.1 10*3/uL — ABNORMAL HIGH (ref 4.0–10.5)
nRBC: 0 % (ref 0.0–0.2)

## 2020-06-26 LAB — BASIC METABOLIC PANEL
Anion gap: 10 (ref 5–15)
BUN: 12 mg/dL (ref 6–20)
CO2: 28 mmol/L (ref 22–32)
Calcium: 9.2 mg/dL (ref 8.9–10.3)
Chloride: 103 mmol/L (ref 98–111)
Creatinine, Ser: 1.16 mg/dL (ref 0.61–1.24)
GFR, Estimated: >60 mL/min (ref >60)
Glucose, Bld: 143 mg/dL — ABNORMAL HIGH (ref 70–99)
Potassium: 3.2 mmol/L — ABNORMAL LOW (ref 3.5–5.1)
Sodium: 141 mmol/L (ref 135–145)

## 2020-06-26 NOTE — Telephone Encounter (Signed)
Patient calling in regarding lab work due before his cardiac cath. Patient states he will be going this morning to get necessary alb work done to continue with cath tomorrow

## 2020-06-27 ENCOUNTER — Encounter: Payer: Self-pay | Admitting: Internal Medicine

## 2020-06-27 ENCOUNTER — Ambulatory Visit
Admission: RE | Admit: 2020-06-27 | Discharge: 2020-06-27 | Disposition: A | Discharge status: Home / Self Care | Payer: Medicare Other | Attending: Internal Medicine | Admitting: Internal Medicine

## 2020-06-27 ENCOUNTER — Other Ambulatory Visit: Payer: Self-pay

## 2020-06-27 ENCOUNTER — Telehealth: Payer: Self-pay | Admitting: *Deleted

## 2020-06-27 ENCOUNTER — Encounter
Admission: RE | Disposition: A | Discharge status: Home / Self Care | Payer: Self-pay | Source: Home / Self Care | Attending: Internal Medicine

## 2020-06-27 DIAGNOSIS — R9431 Abnormal electrocardiogram [ECG] [EKG]: Secondary | ICD-10-CM

## 2020-06-27 DIAGNOSIS — R002 Palpitations: Secondary | ICD-10-CM | POA: Insufficient documentation

## 2020-06-27 DIAGNOSIS — R9439 Abnormal result of other cardiovascular function study: Secondary | ICD-10-CM | POA: Diagnosis present

## 2020-06-27 DIAGNOSIS — Z791 Long term (current) use of non-steroidal anti-inflammatories (NSAID): Secondary | ICD-10-CM | POA: Diagnosis not present

## 2020-06-27 DIAGNOSIS — I2510 Atherosclerotic heart disease of native coronary artery without angina pectoris: Secondary | ICD-10-CM

## 2020-06-27 DIAGNOSIS — Z8616 Personal history of COVID-19: Secondary | ICD-10-CM | POA: Insufficient documentation

## 2020-06-27 DIAGNOSIS — E876 Hypokalemia: Secondary | ICD-10-CM

## 2020-06-27 DIAGNOSIS — Z79899 Other long term (current) drug therapy: Secondary | ICD-10-CM | POA: Insufficient documentation

## 2020-06-27 DIAGNOSIS — I10 Essential (primary) hypertension: Secondary | ICD-10-CM | POA: Insufficient documentation

## 2020-06-27 DIAGNOSIS — Z7982 Long term (current) use of aspirin: Secondary | ICD-10-CM | POA: Diagnosis not present

## 2020-06-27 DIAGNOSIS — R0602 Shortness of breath: Secondary | ICD-10-CM | POA: Diagnosis present

## 2020-06-27 DIAGNOSIS — I440 Atrioventricular block, first degree: Secondary | ICD-10-CM | POA: Insufficient documentation

## 2020-06-27 DIAGNOSIS — I428 Other cardiomyopathies: Secondary | ICD-10-CM | POA: Diagnosis not present

## 2020-06-27 DIAGNOSIS — Z6841 Body Mass Index (BMI) 40.0 and over, adult: Secondary | ICD-10-CM | POA: Insufficient documentation

## 2020-06-27 HISTORY — PX: LEFT HEART CATH AND CORONARY ANGIOGRAPHY: CATH118249

## 2020-06-27 HISTORY — DX: Abnormal result of other cardiovascular function study: R94.39

## 2020-06-27 SURGERY — LEFT HEART CATH AND CORONARY ANGIOGRAPHY
Anesthesia: Moderate Sedation | Laterality: Left

## 2020-06-27 MED ORDER — HEPARIN (PORCINE) IN NACL 1000-0.9 UT/500ML-% IV SOLN
INTRAVENOUS | Status: AC
  Filled 2020-06-27: qty 1000

## 2020-06-27 MED ORDER — HEPARIN SODIUM (PORCINE) 1000 UNIT/ML IJ SOLN
INTRAMUSCULAR | Status: AC
  Filled 2020-06-27: qty 1

## 2020-06-27 MED ORDER — MIDAZOLAM HCL 2 MG/2ML IJ SOLN
INTRAMUSCULAR | Status: AC
  Filled 2020-06-27: qty 2

## 2020-06-27 MED ORDER — ACETAMINOPHEN 325 MG PO TABS: 650.0000 mg | ORAL_TABLET | ORAL | Status: DC | PRN

## 2020-06-27 MED ORDER — HYDRALAZINE HCL 20 MG/ML IJ SOLN: 10.0000 mg | INTRAMUSCULAR | Status: DC | PRN

## 2020-06-27 MED ORDER — MIDAZOLAM HCL 2 MG/2ML IJ SOLN
INTRAMUSCULAR | Status: DC | PRN
  Administered 2020-06-27: 1 mg via INTRAVENOUS

## 2020-06-27 MED ORDER — SODIUM CHLORIDE 0.9% FLUSH: 3.0000 mL | Freq: Two times a day (BID) | INTRAVENOUS | Status: DC

## 2020-06-27 MED ORDER — LIDOCAINE HCL (PF) 1 % IJ SOLN
INTRAMUSCULAR | Status: DC | PRN
  Administered 2020-06-27: 2 mL

## 2020-06-27 MED ORDER — SODIUM CHLORIDE 0.9 % IV SOLN: 250.0000 mL | INTRAVENOUS | Status: DC | PRN

## 2020-06-27 MED ORDER — IOHEXOL 300 MG/ML  SOLN
INTRAMUSCULAR | Status: DC | PRN
  Administered 2020-06-27: 130 mL

## 2020-06-27 MED ORDER — VERAPAMIL HCL 2.5 MG/ML IV SOLN
INTRAVENOUS | Status: AC
  Filled 2020-06-27: qty 2

## 2020-06-27 MED ORDER — FENTANYL CITRATE (PF) 100 MCG/2ML IJ SOLN
INTRAMUSCULAR | Status: DC | PRN
  Administered 2020-06-27: 25 ug via INTRAVENOUS

## 2020-06-27 MED ORDER — SPIRONOLACTONE 25 MG PO TABS: 25.0000 mg | ORAL_TABLET | Freq: Every day | ORAL | 5 refills | Status: DC

## 2020-06-27 MED ORDER — LIDOCAINE HCL (PF) 1 % IJ SOLN
INTRAMUSCULAR | Status: AC
  Filled 2020-06-27: qty 30

## 2020-06-27 MED ORDER — FENTANYL CITRATE (PF) 100 MCG/2ML IJ SOLN
INTRAMUSCULAR | Status: AC
  Filled 2020-06-27: qty 2

## 2020-06-27 MED ORDER — SODIUM CHLORIDE 0.9 % IV SOLN: INTRAVENOUS | Status: DC

## 2020-06-27 MED ORDER — ASPIRIN 81 MG PO CHEW
CHEWABLE_TABLET | ORAL | Status: AC
  Filled 2020-06-27: qty 1

## 2020-06-27 MED ORDER — SODIUM CHLORIDE 0.9 % WEIGHT BASED INFUSION: 1.0000 mL/kg/h | INTRAVENOUS | Status: DC

## 2020-06-27 MED ORDER — SODIUM CHLORIDE 0.9% FLUSH: 3.0000 mL | INTRAVENOUS | Status: DC | PRN

## 2020-06-27 MED ORDER — VERAPAMIL HCL 2.5 MG/ML IV SOLN
INTRAVENOUS | Status: DC | PRN
  Administered 2020-06-27: 2.5 mg via INTRA_ARTERIAL

## 2020-06-27 MED ORDER — LABETALOL HCL 5 MG/ML IV SOLN: 10.0000 mg | INTRAVENOUS | Status: DC | PRN

## 2020-06-27 MED ORDER — HEPARIN SODIUM (PORCINE) 1000 UNIT/ML IJ SOLN
INTRAMUSCULAR | Status: DC | PRN
  Administered 2020-06-27: 6000 [IU] via INTRAVENOUS

## 2020-06-27 MED ORDER — ONDANSETRON HCL 4 MG/2ML IJ SOLN: 4.0000 mg | Freq: Four times a day (QID) | INTRAMUSCULAR | Status: DC | PRN

## 2020-06-27 MED ORDER — ASPIRIN 81 MG PO CHEW
81.0000 mg | CHEWABLE_TABLET | ORAL | Status: AC
  Administered 2020-06-27: 81 mg via ORAL

## 2020-06-27 MED ORDER — SODIUM CHLORIDE 0.9 % WEIGHT BASED INFUSION
3.0000 mL/kg/h | INTRAVENOUS | Status: AC
  Administered 2020-06-27: 3 mL/kg/h via INTRAVENOUS

## 2020-06-27 SURGICAL SUPPLY — 10 items
CATH 5F 110X4 TIG (CATHETERS) ×2 IMPLANT
CATH INFINITI 5 FR AL2 (CATHETERS) ×2 IMPLANT
CATH INFINITI 5 FR JL3.5 (CATHETERS) ×2 IMPLANT
CATH INFINITI JR4 5F (CATHETERS) ×2 IMPLANT
DEVICE RAD COMP TR BAND LRG (VASCULAR PRODUCTS) ×2 IMPLANT
GLIDESHEATH SLEND A-KIT 6F 22G (SHEATH) ×2 IMPLANT
GUIDEWIRE INQWIRE 1.5J.035X260 (WIRE) ×1 IMPLANT
INQWIRE 1.5J .035X260CM (WIRE) ×2
KIT MANI 3VAL PERCEP (MISCELLANEOUS) ×2 IMPLANT
PACK CARDIAC CATH (CUSTOM PROCEDURE TRAY) ×2 IMPLANT

## 2020-06-27 NOTE — Telephone Encounter (Signed)
-----   Message from Nelva Bush, MD sent at 06/27/2020 10:49 AM EST ----- Regarding: Post-cath labs Good morning,  I am adding spironolactone to Aaron [name]' medication regimen following today's cath.  Could you help arrange for him to have a BMP on Monday (1/31)?  He should keep his scheduled f/u with [facility] on 2/9.  Thanks.  [name]

## 2020-06-27 NOTE — Interval H&P Note (Signed)
History and Physical Interval Note:  06/27/2020 9:34 AM  [name]  has presented today for surgery, with the diagnosis of shortness of breath, chest pain, and abnormal stress test.  The various methods of treatment have been discussed with the patient and family. After consideration of risks, benefits and other options for treatment, the patient has consented to  Procedure(s): LEFT HEART CATH AND CORONARY ANGIOGRAPHY (Left) as a surgical intervention.  The patient's history has been reviewed, patient examined, no change in status, stable for surgery.  I have reviewed the patient's chart and labs.  Questions were answered to the patient's satisfaction.    Cath Lab Visit (complete for each Cath Lab visit)  Clinical Evaluation Leading to the Procedure:   ACS: No.  Non-ACS:    Anginal Classification: CCS III  Anti-ischemic medical therapy: Maximal Therapy (2 or more classes of medications)  Non-Invasive Test Results: Intermediate-risk stress test findings: cardiac mortality 1-3%/year  Prior CABG: No previous CABG  Leaha Cuervo

## 2020-06-27 NOTE — Telephone Encounter (Signed)
Spoke with fiance Aaron [name] per release form. She is currently with patient in procedure area and not able to review information at this time. Inquired when would be a good time to call back for review of information along with labs. She requested that I call back around 4:00 PM today and she will be able to review and write down information of changes and requests. She was appreciative for the call and will be available later today.

## 2020-06-27 NOTE — Telephone Encounter (Signed)
Spoke with patients fiance per release form and reviewed that provider would like him to have repeat labs on 1/31 at the [facility] entrance of the hospital. Advised that appointment is not needed but to have them done there and then keep follow up appointment on 07/12/20 at 3:30 pm with Laurann Montana NP. She verbalized understanding of instructions, appointment information, and had no further questions at this time. Encouraged her to have him call if any concerns before that time. She verbalized understanding and was appreciative for the call.

## 2020-06-27 NOTE — Telephone Encounter (Signed)
-----   Message from Loel Dubonnet, NP sent at 06/27/2020 12:05 PM EST ----- Aaron [name] pre-catheterization labs showed mildly low potassium. Dr. Saunders Revel started him on Spironolactone today after catheterization. Spironolactone should help optimize his medical therapy and increase his potassium. He will need BMP Monday (1/31) and to keep his appointment with me 2/9. Lab work otherwise unremarkable.  Of note, Dr. Saunders Revel routed a message to triage regarding the upcoming labs as well. A telephone encounter has been started.

## 2020-07-06 ENCOUNTER — Other Ambulatory Visit
Admission: RE | Admit: 2020-07-06 | Discharge: 2020-07-06 | Disposition: A | Discharge status: Home / Self Care | Payer: Medicare Other | Attending: Internal Medicine | Admitting: Internal Medicine

## 2020-07-06 ENCOUNTER — Other Ambulatory Visit: Payer: Self-pay

## 2020-07-06 DIAGNOSIS — E876 Hypokalemia: Secondary | ICD-10-CM

## 2020-07-06 LAB — BASIC METABOLIC PANEL
Anion gap: 9 (ref 5–15)
BUN: 12 mg/dL (ref 6–20)
CO2: 25 mmol/L (ref 22–32)
Calcium: 9.1 mg/dL (ref 8.9–10.3)
Chloride: 108 mmol/L (ref 98–111)
Creatinine, Ser: 1.02 mg/dL (ref 0.61–1.24)
GFR, Estimated: >60 mL/min (ref >60)
Glucose, Bld: 168 mg/dL — ABNORMAL HIGH (ref 70–99)
Potassium: 3.7 mmol/L (ref 3.5–5.1)
Sodium: 142 mmol/L (ref 135–145)

## 2020-07-07 ENCOUNTER — Ambulatory Visit (INDEPENDENT_AMBULATORY_CARE_PROVIDER_SITE_OTHER): Payer: Medicare Other | Admitting: Internal Medicine

## 2020-07-07 ENCOUNTER — Ambulatory Visit
Admission: RE | Admit: 2020-07-07 | Discharge: 2020-07-07 | Disposition: A | Discharge status: Home / Self Care | Payer: Medicare Other | Source: Ambulatory Visit | Attending: Internal Medicine | Admitting: Internal Medicine

## 2020-07-07 ENCOUNTER — Encounter: Payer: Self-pay | Admitting: Internal Medicine

## 2020-07-07 VITALS — BP 144/100 | HR 78 | Ht 69.0 in | Wt 302.0 lb

## 2020-07-07 DIAGNOSIS — I2510 Atherosclerotic heart disease of native coronary artery without angina pectoris: Secondary | ICD-10-CM | POA: Diagnosis not present

## 2020-07-07 DIAGNOSIS — Z8616 Personal history of COVID-19: Secondary | ICD-10-CM | POA: Diagnosis present

## 2020-07-07 DIAGNOSIS — R059 Cough, unspecified: Secondary | ICD-10-CM | POA: Insufficient documentation

## 2020-07-07 DIAGNOSIS — I5022 Chronic systolic (congestive) heart failure: Secondary | ICD-10-CM | POA: Diagnosis not present

## 2020-07-07 DIAGNOSIS — I10 Essential (primary) hypertension: Secondary | ICD-10-CM | POA: Diagnosis not present

## 2020-07-07 DIAGNOSIS — R0602 Shortness of breath: Secondary | ICD-10-CM

## 2020-07-07 DIAGNOSIS — I5032 Chronic diastolic (congestive) heart failure: Secondary | ICD-10-CM | POA: Insufficient documentation

## 2020-07-07 DIAGNOSIS — I25118 Atherosclerotic heart disease of native coronary artery with other forms of angina pectoris: Secondary | ICD-10-CM | POA: Insufficient documentation

## 2020-07-07 DIAGNOSIS — G4733 Obstructive sleep apnea (adult) (pediatric): Secondary | ICD-10-CM

## 2020-07-07 HISTORY — DX: Cough, unspecified: R05.9

## 2020-07-07 MED ORDER — FUROSEMIDE 40 MG PO TABS: 40.0000 mg | ORAL_TABLET | Freq: Every day | ORAL | 3 refills | Status: DC

## 2020-07-07 NOTE — Progress Notes (Signed)
Follow-up Outpatient Visit Date: 07/07/2020  Primary Care Provider: Trinna Post, PA-C [address] [place] [address]  Chief Complaint: Cough and chest congestion  HPI:  Aaron [name] is a 55 y.o. male with history of mild, nonobstructive CAD, nonischemic cardiomyopathy (LVEF ~40% by left ventriculogram in 06/2020), HTN, kidney stones, morbid obesity, and COVID-19 infection (05/2020) who presents for follow-up of heart failure coronary artery disease.  During his hospitalization for COVID-19 in December, he was given a diagnosis of atrial fibrillation the review of EKGs from that hospitalization indicates sinus rhythm with frequent PACs and PVCs but no definite evidence of atrial fibrillation.  He underwent catheterization 1.5 weeks ago, which time only mild CAD was noted.  LVEF appeared moderately reduced with mildly elevated filling pressure, prompting [place] to add spironolactone to his regimen of losartan and metoprolol succinate.  Patient reached out to our office yesterday after having been told by his ENT specialist that he has fluid on his lungs.  Today, Aaron [name] reports that he has been dealing with a cough and chest/throat congestion that began 2 to 3 days after his catheterization last week.  It is similar to what he experienced with COVID-19 and resolved with a course of steroids.  He has some occasional chest tightness and soreness, particularly when he coughs.  He also reports watery eyes and some nasal congestion, which is what took him to his ENT yesterday.  He feels like he has been wheezing some.  He has been using albuterol and ipratropium without much improvement.  He is not using his CPAP machine as it is currently under recall.  His weight has been stable.  He denies edema.  He continues to have occasional palpitations.  --------------------------------------------------------------------------------------------------  Past Medical History:  Diagnosis Date   . Chronic back pain    disc  . Coronary artery disease   . HFrEF (heart failure with reduced ejection fraction) ([place])   . Hypertension   . Kidney stone   . MDD (major depressive disorder)    Past Surgical History:  Procedure Laterality Date  . KIDNEY STONE SURGERY    . LEFT HEART CATH AND CORONARY ANGIOGRAPHY Left 06/27/2020   Procedure: LEFT HEART CATH AND CORONARY ANGIOGRAPHY;  Surgeon: Nelva Bush, MD;  Location: [facility];  Service: Cardiovascular;  Laterality: Left;  Marland Kitchen VASECTOMY      Current Meds  Medication Sig  . acetaminophen (TYLENOL) 325 MG tablet Take 650-975 mg by mouth every 6 (six) hours as needed for moderate pain or headache (for pain.).  Marland Kitchen albuterol (VENTOLIN HFA) 108 (90 Base) MCG/ACT inhaler Inhale 2 puffs into the lungs every 6 (six) hours as needed for wheezing or shortness of breath.  . celecoxib (CELEBREX) 100 MG capsule Take 1 capsule (100 mg total) by mouth daily as needed. (Patient taking differently: Take 100 mg by mouth daily.)  . cetirizine (ZYRTEC) 10 MG tablet Take 1 tablet (10 mg total) by mouth daily as needed (allergies.). (Patient taking differently: Take 10 mg by mouth daily.)  . cyclobenzaprine (FLEXERIL) 10 MG tablet Take 10 mg by mouth at bedtime.  . fluticasone (FLONASE) 50 MCG/ACT nasal spray Place 2 sprays into both nostrils daily as needed (allergies.). (Patient taking differently: Place 2 sprays into both nostrils daily as needed for allergies or rhinitis.)  . gabapentin (NEURONTIN) 600 MG tablet Take 600 mg by mouth 3 (three) times daily.  Marland Kitchen ibuprofen (ADVIL) 200 MG tablet Take 600 mg by mouth every  6 (six) hours as needed for headache.  . ipratropium (ATROVENT HFA) 17 MCG/ACT inhaler Inhale 2 puffs into the lungs every 6 (six) hours.  Marland Kitchen losartan (COZAAR) 100 MG tablet Take 1 tablet (100 mg total) by mouth daily.  . metoprolol succinate (TOPROL-XL) 50 MG 24 hr tablet Take 0.5 tablets (25 mg total) by mouth daily. Take with or  immediately following a meal.  . montelukast (SINGULAIR) 10 MG tablet Take 1 tablet (10 mg total) by mouth at bedtime as needed (allergies.). (Patient taking differently: Take 10 mg by mouth at bedtime.)  . spironolactone (ALDACTONE) 25 MG tablet Take 1 tablet (25 mg total) by mouth daily.    Allergies: Patient has no known allergies.  Social History   Tobacco Use  . Smoking status: Never Smoker  . Smokeless tobacco: Never Used  Vaping Use  . Vaping Use: Never used  Substance Use Topics  . Alcohol use: Not Currently  . Drug use: Never    Family History  Problem Relation Age of Onset  . COPD Mother   . Cancer Mother   . Hypertension Father   . Congestive Heart Failure Father   . COPD Father   . Lung cancer Father   . Heart attack Father 24  . Hypertension Sister   . Diabetes Paternal Grandfather   . Heart attack Paternal Grandfather   . Healthy Son   . Healthy Daughter     Review of Systems: A 12-system review of systems was performed and was negative except as noted in the HPI.  --------------------------------------------------------------------------------------------------  Physical Exam: BP (!) 144/100 (BP Location: Left Arm, Patient Position: Sitting, Cuff Size: Large)   Pulse 78   Ht 5' 9"  (1.753 m)   Wt (!) 302 lb (137 kg)   SpO2 98%   BMI 44.60 kg/m   General:  NAD. Neck: No JVD or HJR, though evaluation is limited by body habitus. Lungs: Coarse breath sounds with good air movement.  No wheezes or crackles. Heart: Regular rate and rhythm without murmurs, rubs, or gallops. Abdomen: Soft, nontender, nondistended. Extremities: Trace pretibial edema.  Right radial arteriotomy site well-healed.  EKG: Normal sinus rhythm with PACs and first-degree AV block, left axis deviation, and LVH with anterolateral ST/T changes most likely reflecting abnormal repolarization.  Compared with prior tracing from 05/18/2020, PVCs are no longer present.  Lab Results   Component Value Date   WBC 11.1 (H) 06/26/2020   HGB 15.5 06/26/2020   HCT 45.9 06/26/2020   MCV 89.0 06/26/2020   PLT 347 06/26/2020    Lab Results  Component Value Date   NA 142 07/06/2020   K 3.7 07/06/2020   CL 108 07/06/2020   CO2 25 07/06/2020   BUN 12 07/06/2020   CREATININE 1.02 07/06/2020   GLUCOSE 168 (H) 07/06/2020   ALT 67 (H) 05/20/2020    Lab Results  Component Value Date   CHOL 96 05/19/2020   HDL 27 (L) 05/19/2020   LDLCALC 55 05/19/2020   TRIG 70 05/19/2020   CHOLHDL 3.6 05/19/2020    --------------------------------------------------------------------------------------------------  ASSESSMENT AND PLAN: Cough, shortness of breath, and chronic HFrEF: Symptoms are likely multifactorial though Aaron [name] does not appear to be acutely decompensated from a heart failure standpoint.  Volume exam is challenging due to his morbid obesity, but his weight is overall stable.  I do not appreciate crackles on the lung exam today nor is his JVD grossly elevated.  LVEDP at the time of catheterization last week was  only mildly elevated at 15 to 20 mmHg in the setting of an LVEF of 40%.  Only medication change since the time of his catheterization was the addition of spironolactone.  Labs yesterday showed stable renal function and normalized potassium.  We have agreed to add furosemide 40 mg p.o. daily to see if this helps his symptoms.  I will order a PA and lateral chest radiograph today and also refer him to pulmonology for further assessment of underlying lung disease, particularly pulmonary changes associated with his recent COVID-19 infection.  I advised him to reach out to his PCP if his symptoms do not improve to discuss another course of steroids, which helped him in the past.  Hypertension: Blood pressure remains suboptimally controlled today.  As above, we will add furosemide.  We will defer additional medication changes at this time, as I am reluctant to escalate  metoprolol in the setting of first-degree AV block.  At some point, we may need to consider transitioning from losartan to [facility], particularly if blood pressure remains high and/or LVEF does not improve.  Coronary artery disease: Mild nonobstructive CAD noted on recent catheterization.  Continue with medical therapy and risk factor modification to prevent progression of disease.  Obstructive sleep apnea: Aaron [name] currently is without his CPAP machine as it is on recall.  We will refer him to pulmonology for further management.  Morbid obesity: BMI remains greater than 40.  I have encouraged weight loss through diet and exercise.  Follow-up: Return to clinic in 3 to 4 weeks.  BMP should be checked at that time.  Nelva Bush, MD 07/07/2020 10:31 AM

## 2020-07-07 NOTE — Patient Instructions (Signed)
Medication Instructions:  Your physician has recommended you make the following change in your medication:  1- START Furosemide 40 mg by mouth once a day.   *If you need a refill on your cardiac medications before your next appointment, please call your pharmacy*  Lab Work: none If you have labs (blood work) drawn today and your tests are completely normal, you will receive your results only by: Marland Kitchen MyChart Message (if you have MyChart) OR . A paper copy in the mail If you have any lab test that is abnormal or we need to change your treatment, we will call you to review the results.  Testing/Procedures: A chest x-ray takes a picture of the organs and structures inside the chest, including the heart, lungs, and blood vessels. This test can show several things, including, whether the heart is enlarges; whether fluid is building up in the lungs; and whether pacemaker / defibrillator leads are still in place. - Please go to the [facility]. You will check in at the front desk to the right as you walk into the atrium. Valet Parking is offered if needed. - No appointment needed. You may go any day between 7 am and 6 pm.  Follow-Up:  You have been referred to Pulmonology for evaluation of shortness of breath, history of Covid, and sleep apnea. Please call [phone] to schedule an appointment so you will be seen sooner.    At [facility], you and your health needs are our priority.  As part of our continuing mission to provide you with exceptional heart care, we have created designated Provider Care Teams.  These Care Teams include your primary Cardiologist (physician) and Advanced Practice Providers (APPs -  Physician Assistants and Nurse Practitioners) who all work together to provide you with the care you need, when you need it.  We recommend signing up for the patient portal called "MyChart".  Sign up information is provided on this After Visit Summary.  MyChart is used to connect  with patients for Virtual Visits (Telemedicine).  Patients are able to view lab/test results, encounter notes, upcoming appointments, etc.  Non-urgent messages can be sent to your provider as well.   To learn more about what you can do with MyChart, go to [email or web address].    Your next appointment:   3-4 week(s)  The format for your next appointment:   In Person  Provider:   You may see Nelva Bush, MD or one of the following Advanced Practice Providers on your designated Care Team:    Murray Hodgkins, NP  Christell Faith, PA-C  Marrianne Mood, PA-C  Cadence Somerset, [place]  Laurann Montana, NP

## 2020-07-12 ENCOUNTER — Ambulatory Visit: Payer: Medicare Other | Admitting: Family

## 2020-07-13 ENCOUNTER — Ambulatory Visit: Payer: Medicare Other | Admitting: Family

## 2020-07-15 ENCOUNTER — Other Ambulatory Visit: Payer: Self-pay | Admitting: Physician Assistant

## 2020-07-15 DIAGNOSIS — I10 Essential (primary) hypertension: Secondary | ICD-10-CM

## 2020-07-28 ENCOUNTER — Telehealth: Payer: Self-pay | Admitting: Adult Health

## 2020-07-28 ENCOUNTER — Ambulatory Visit (INDEPENDENT_AMBULATORY_CARE_PROVIDER_SITE_OTHER): Payer: Medicare Other | Admitting: Adult Health

## 2020-07-28 ENCOUNTER — Other Ambulatory Visit: Payer: Self-pay

## 2020-07-28 VITALS — BP 142/92 | HR 80 | Temp 97.8°F | Ht 69.0 in | Wt 306.0 lb

## 2020-07-28 DIAGNOSIS — G4733 Obstructive sleep apnea (adult) (pediatric): Secondary | ICD-10-CM | POA: Diagnosis not present

## 2020-07-28 DIAGNOSIS — J9600 Acute respiratory failure, unspecified whether with hypoxia or hypercapnia: Secondary | ICD-10-CM

## 2020-07-28 DIAGNOSIS — J1282 Pneumonia due to coronavirus disease 2019: Secondary | ICD-10-CM | POA: Diagnosis not present

## 2020-07-28 DIAGNOSIS — I2510 Atherosclerotic heart disease of native coronary artery without angina pectoris: Secondary | ICD-10-CM | POA: Diagnosis not present

## 2020-07-28 DIAGNOSIS — U071 COVID-19: Secondary | ICD-10-CM

## 2020-07-28 DIAGNOSIS — R0602 Shortness of breath: Secondary | ICD-10-CM | POA: Diagnosis not present

## 2020-07-28 NOTE — Assessment & Plan Note (Signed)
Resolved.  No longer needing oxygen.  Can DC oxygen

## 2020-07-28 NOTE — Assessment & Plan Note (Signed)
Dyspnea post Covid 19 and Covid pneumonia.  Patient appears to be clinically improving no longer needing oxygen.  Can DC oxygen.\ Check PFTs on return.  Plan  Patient Instructions  Set up Split night sleep study .  Healthy sleep regimen  Do not drive if sleepy  Avoid sedating meds if able .  Mucinex DM Twice daily  As needed  Cough /congestion  Albuterol inhaler As needed   Discontinue Oxygen at home .  Follow up with in 6 weeks with Dr. Patsey Berthold or Archie Shea NP with PFT and As needed

## 2020-07-28 NOTE — Patient Instructions (Addendum)
Set up Split night sleep study .  Healthy sleep regimen  Do not drive if sleepy  Avoid sedating meds if able .  Mucinex DM Twice daily  As needed  Cough /congestion  Albuterol inhaler As needed   Discontinue Oxygen at home .  Follow up with in 6 weeks with Dr. Patsey Berthold or Dorinne Graeff NP with PFT and As needed

## 2020-07-28 NOTE — Progress Notes (Addendum)
@Patient  ID: Aaron [name], male    DOB: 1965/06/15, 55 y.o.   MRN: [id number]  Chief Complaint  Patient presents with  . sleep consult    Referring provider: Nelva Bush, MD  HPI: 55 year old male never smoker presents for sleep and pulmonary consult July 28, 2020 for sleep apnea and dyspnea post COVID-19 infection and pneumonia in December 2021 Medical history significant for hypertension, cardiomyopathy, A. fib  depression, anxiety, ADD, chronic back pain  TEST/EVENTS :  Admitted December 2021 with acute respiratory failure with hypoxemia, [id number] infection complicated by Covid pneumonia treated with remdesivir and IV steroids discharged on oxygen  CT chest May 11, 2020 mild patchy groundglass opacities bilaterally in the lungs.  Negative for PE  Left heart cath and coronary angiography June 27, 2020 mild nonobstructive coronary disease, moderately reduced LVEF around 40%  07/28/2020 Consult : Sleep apnea, dyspnea post COVID-19 Patient presents for a consult for sleep apnea and ongoing dyspnea post COVID-19. Patient has previously been diagnosed with sleep apnea in the past.  Has not been using his CPAP for several years now.  Epworth score is 9.  Patient has associated loud snoring daytime sleepiness.  Typically goes to bed around 11 PM to 2 AM.  Takes about 1 hour to go to sleep.  Gets up about 2 times throughout the night.  And awakes around 9:52 AM.  Does not operate heavy machinery.  No history of narcolepsy or sleepwalking. Caffeine intake 3 sodas a day .   Patient was admitted December 2021 with COVID-19.  He had a acute respiratory failure with hypoxemia and Covid pneumonia.  He was treated with remdesivir and IV steroids.  He did require oxygen at discharge.  CT chest during hospitalization showed mild patchy groundglass opacities.  There was negative for PE.  Patient has made improvement since discharge.  Follow-up chest x-ray on February 4 showed resolved  pulmonary infiltrates.  With possible postpneumonic scarring. Patient says he is no longer using oxygen.  Once oxygen to be discontinued.  O2 saturation 98% on room air. Feeling better , still short of breath with acitivites and cough with thick mucus.  No fever, hemoptysis , edema.  Has albuterol and ipratropium inhaler As needed   Walking at home on room air, O2 sats 92-98%.  Has been vaccinated for COVID-19.  Disabled due from heart and back.   Not active , sedentary lifestyle. Limited due to back pain. Did light walking .    No Known Allergies  Immunization History  Administered Date(s) Administered  . Influenza,inj,Quad PF,6+ Mos 05/20/2018, 06/10/2020  . Tdap 12/14/2019    Past Medical History:  Diagnosis Date  . Chronic back pain    disc  . Coronary artery disease   . HFrEF (heart failure with reduced ejection fraction) ([place])   . Hypertension   . Kidney stone   . MDD (major depressive disorder)     Tobacco History: Social History   Tobacco Use  Smoking Status Never Smoker  Smokeless Tobacco Never Used   Counseling given: Not Answered  SH: never smoker , no etoh. Married. Adult kids , disabled.   FH : cancer hx   Outpatient Medications Prior to Visit  Medication Sig Dispense Refill  . acetaminophen (TYLENOL) 325 MG tablet Take 650-975 mg by mouth every 6 (six) hours as needed for moderate pain or headache (for pain.).    Marland Kitchen albuterol (VENTOLIN HFA) 108 (90 Base) MCG/ACT inhaler Inhale 2 puffs into the lungs every 6 (  six) hours as needed for wheezing or shortness of breath.    . celecoxib (CELEBREX) 100 MG capsule Take 1 capsule (100 mg total) by mouth daily as needed. (Patient taking differently: Take 100 mg by mouth daily.)    . cetirizine (ZYRTEC) 10 MG tablet Take 1 tablet (10 mg total) by mouth daily as needed (allergies.). (Patient taking differently: Take 10 mg by mouth daily.)    . cyclobenzaprine (FLEXERIL) 10 MG tablet Take 10 mg by mouth at bedtime.     . fluticasone (FLONASE) 50 MCG/ACT nasal spray Place 2 sprays into both nostrils daily as needed (allergies.). (Patient taking differently: Place 2 sprays into both nostrils daily as needed for allergies or rhinitis.)    . furosemide (LASIX) 40 MG tablet Take 1 tablet (40 mg total) by mouth daily. 30 tablet 3  . gabapentin (NEURONTIN) 600 MG tablet Take 600 mg by mouth 3 (three) times daily.    Marland Kitchen ibuprofen (ADVIL) 200 MG tablet Take 600 mg by mouth every 6 (six) hours as needed for headache.    . ipratropium (ATROVENT HFA) 17 MCG/ACT inhaler Inhale 2 puffs into the lungs every 6 (six) hours.    Marland Kitchen losartan (COZAAR) 100 MG tablet TAKE 1 TABLET BY MOUTH EVERY DAY 90 tablet 1  . montelukast (SINGULAIR) 10 MG tablet Take 1 tablet (10 mg total) by mouth at bedtime as needed (allergies.). (Patient taking differently: Take 10 mg by mouth at bedtime.)    . spironolactone (ALDACTONE) 25 MG tablet Take 1 tablet (25 mg total) by mouth daily. 30 tablet 5  . metoprolol succinate (TOPROL-XL) 50 MG 24 hr tablet Take 0.5 tablets (25 mg total) by mouth daily. Take with or immediately following a meal. 90 tablet 2   No facility-administered medications prior to visit.     Review of Systems:   Constitutional:   No  weight loss, night sweats,  Fevers, chills, fatigue, or  lassitude.  HEENT:   No headaches,  Difficulty swallowing,  Tooth/dental problems, or  Sore throat,                No sneezing, itching, ear ache, nasal congestion, post nasal drip,   CV:  No chest pain,  Orthopnea, PND, swelling in lower extremities, anasarca, dizziness, palpitations, syncope.   GI  No heartburn, indigestion, abdominal pain, nausea, vomiting, diarrhea, change in bowel habits, loss of appetite, bloody stools.   Resp:    No chest wall deformity  Skin: no rash or lesions.  GU: no dysuria, change in color of urine, no urgency or frequency.  No flank pain, no hematuria   MS:  +back pain     Physical Exam  BP (!)  142/92 (BP Location: Left Arm, Cuff Size: Normal)   Pulse 80   Temp 97.8 F (36.6 C) (Temporal)   Ht 5\' 9"  (1.753 m)   Wt (!) 306 lb (138.8 kg)   SpO2 98%   BMI 45.19 kg/m   GEN: A/Ox3; pleasant , NAD, BM 45    HEENT:  [place]/AT,    NOSE-clear, THROAT-clear, no lesions, no postnasal drip or exudate noted. Class 3 MP   NECK:  Supple w/ fair ROM; no JVD; normal carotid impulses w/o bruits; no thyromegaly or nodules palpated; no lymphadenopathy.    RESP  Clear  P & A; w/o, wheezes/ rales/ or rhonchi. no accessory muscle use, no dullness to percussion  CARD:  RRR, no m/r/g, no peripheral edema, pulses intact, no cyanosis or clubbing.  GI:   Soft & nt; nml bowel sounds; no organomegaly or masses detected.   Musco: Warm bil, no deformities or joint swelling noted.   Neuro: alert, no focal deficits noted.    Skin: Warm, no lesions or rashes    Lab Results:    ProBNP No results found for: PROBNP  Imaging: DG Chest 2 View  Result Date: 07/07/2020 CLINICAL DATA:  Shortness of breath. History of COVID pneumonia in December 2021. EXAM: CHEST - 2 VIEW COMPARISON:  Chest x-ray 05/18/2020 and chest CT 05/11/2020 FINDINGS: The heart is within normal limits in size. Prominent mediastinal contours due to epicardial fat on the CT scan. The pulmonary infiltrates seen on the prior chest x-ray have resolved. I suspect there are some linear areas of post pneumonic scarring or atelectasis. Pleural thickening is due to prominent pleural fat noted on the CT scan. No pleural effusions. No pulmonary lesions. The bony thorax is intact. IMPRESSION: Resolution of pulmonary infiltrates with probable post pneumonic scarring. Electronically Signed   By: Marijo Sanes M.D.   On: 07/07/2020 16:03      No flowsheet data found.  No results found for: NITRICOXIDE      Assessment & Plan:   Pneumonia due to COVID-19 virus Recent chest x-ray shows resolution of pulmonary infiltrates. Patient has some  ongoing dyspnea post COVID.  Will check PFTs on return.  If DLCO is decreased can consider high-resolution CT chest.  But appears to be clinically improving as no longer needing any oxygen.  Plan Patient Instructions  Set up Split night sleep study .  Healthy sleep regimen  Do not drive if sleepy  Avoid sedating meds if able .  Mucinex DM Twice daily  As needed  Cough /congestion  Albuterol inhaler As needed   Discontinue Oxygen at home .  Follow up with in 6 weeks with Dr. Patsey Berthold or Mandrell Vangilder NP with PFT and As needed       Obstructive sleep apnea Known history of obstructive sleep apnea.  Previously poorly tolerated on CPAP.  Patient would like to retry as he has significant daytime symptom burden and multiple comorbidities including congestive heart failure/cardiomyopathy Set up for split-night in lab sleep study. Patient education on sleep apnea treatment options Plan  Patient Instructions  Set up Split night sleep study .  Healthy sleep regimen  Do not drive if sleepy  Avoid sedating meds if able .  Mucinex DM Twice daily  As needed  Cough /congestion  Albuterol inhaler As needed   Discontinue Oxygen at home .  Follow up with in 6 weeks with Dr. Patsey Berthold or Kennisha Qin NP with PFT and As needed       Morbid obesity ([place]) Healthy weight loss discussed  Shortness of breath Dyspnea post Covid 19 and Covid pneumonia.  Patient appears to be clinically improving no longer needing oxygen.  Can DC oxygen.\ Check PFTs on return.  Plan  Patient Instructions  Set up Split night sleep study .  Healthy sleep regimen  Do not drive if sleepy  Avoid sedating meds if able .  Mucinex DM Twice daily  As needed  Cough /congestion  Albuterol inhaler As needed   Discontinue Oxygen at home .  Follow up with in 6 weeks with Dr. Patsey Berthold or Jiovani Mccammon NP with PFT and As needed       Acute respiratory failure due to COVID-19 [facility]) Resolved.  No longer needing oxygen.  Can DC  oxygen     Beaulah Romanek,  NP 07/28/2020

## 2020-07-28 NOTE — Telephone Encounter (Signed)
Repeat sleep study was ordered at today's visit.  Patient had break in therapy. Prior sleep study is not needed.

## 2020-07-28 NOTE — Telephone Encounter (Signed)
Patient had prior sleep around 2018.  Lm for [employer] with sleepmed.

## 2020-07-28 NOTE — Assessment & Plan Note (Signed)
Recent chest x-ray shows resolution of pulmonary infiltrates. Patient has some ongoing dyspnea post COVID.  Will check PFTs on return.  If DLCO is decreased can consider high-resolution CT chest.  But appears to be clinically improving as no longer needing any oxygen.  Plan Patient Instructions  Set up Split night sleep study .  Healthy sleep regimen  Do not drive if sleepy  Avoid sedating meds if able .  Mucinex DM Twice daily  As needed  Cough /congestion  Albuterol inhaler As needed   Discontinue Oxygen at home .  Follow up with in 6 weeks with Dr. Patsey Berthold or Dyanara Cozza NP with PFT and As needed

## 2020-07-28 NOTE — Assessment & Plan Note (Signed)
Healthy weight loss discussed 

## 2020-07-28 NOTE — Assessment & Plan Note (Signed)
Known history of obstructive sleep apnea.  Previously poorly tolerated on CPAP.  Patient would like to retry as he has significant daytime symptom burden and multiple comorbidities including congestive heart failure/cardiomyopathy Set up for split-night in lab sleep study. Patient education on sleep apnea treatment options Plan  Patient Instructions  Set up Split night sleep study .  Healthy sleep regimen  Do not drive if sleepy  Avoid sedating meds if able .  Mucinex DM Twice daily  As needed  Cough /congestion  Albuterol inhaler As needed   Discontinue Oxygen at home .  Follow up with in 6 weeks with Dr. Patsey Berthold or Benno Brensinger NP with PFT and As needed

## 2020-07-28 NOTE — Assessment & Plan Note (Signed)
>>  ASSESSMENT AND PLAN FOR MORBID OBESITY (HCC) WRITTEN ON 07/28/2020  5:12 PM BY PARRETT, TAMMY S, NP  Healthy weight loss discussed

## 2020-07-31 ENCOUNTER — Other Ambulatory Visit: Payer: Self-pay | Admitting: Physician Assistant

## 2020-07-31 NOTE — Progress Notes (Signed)
Agree with the details of the procedure as noted by Tammy Parrett, NP.  C. Laura Amariona Rathje, MD [facility] 

## 2020-08-03 ENCOUNTER — Other Ambulatory Visit: Payer: Self-pay

## 2020-08-03 ENCOUNTER — Ambulatory Visit (INDEPENDENT_AMBULATORY_CARE_PROVIDER_SITE_OTHER): Payer: Medicare Other | Admitting: Family

## 2020-08-03 ENCOUNTER — Encounter: Payer: Self-pay | Admitting: Family

## 2020-08-03 VITALS — BP 122/84 | HR 90 | Ht 69.0 in | Wt 304.0 lb

## 2020-08-03 DIAGNOSIS — G4733 Obstructive sleep apnea (adult) (pediatric): Secondary | ICD-10-CM | POA: Diagnosis not present

## 2020-08-03 DIAGNOSIS — I2510 Atherosclerotic heart disease of native coronary artery without angina pectoris: Secondary | ICD-10-CM | POA: Diagnosis not present

## 2020-08-03 DIAGNOSIS — I10 Essential (primary) hypertension: Secondary | ICD-10-CM

## 2020-08-03 DIAGNOSIS — Z8616 Personal history of COVID-19: Secondary | ICD-10-CM

## 2020-08-03 DIAGNOSIS — I5022 Chronic systolic (congestive) heart failure: Secondary | ICD-10-CM

## 2020-08-03 DIAGNOSIS — R0602 Shortness of breath: Secondary | ICD-10-CM

## 2020-08-03 NOTE — Patient Instructions (Signed)
Medication Instructions:   Continue your current medications.   We may consider adding another medication called Jardiance or Wilder Glade, but this will depend on your lab work today. This new medication would help to strengthen your heart pumping function.   *If you need a refill on your cardiac medications before your next appointment, please call your pharmacy*   Lab Work: Your physician recommends lab work today: BMP  If you have labs (blood work) drawn today and your tests are completely normal, you will receive your results only by: Marland Kitchen MyChart Message (if you have MyChart) OR . A paper copy in the mail If you have any lab test that is abnormal or we need to change your treatment, we will call you to review the results.  Testing/Procedures: None ordered today.   Follow-Up: At [facility], you and your health needs are our priority.  As part of our continuing mission to provide you with exceptional heart care, we have created designated Provider Care Teams.  These Care Teams include your primary Cardiologist (physician) and Advanced Practice Providers (APPs -  Physician Assistants and Nurse Practitioners) who all work together to provide you with the care you need, when you need it.  We recommend signing up for the patient portal called "MyChart".  Sign up information is provided on this After Visit Summary.  MyChart is used to connect with patients for Virtual Visits (Telemedicine).  Patients are able to view lab/test results, encounter notes, upcoming appointments, etc.  Non-urgent messages can be sent to your provider as well.   To learn more about what you can do with MyChart, go to [email or web address].    Your next appointment:   4-6 weeks  The format for your next appointment:   In Person  Provider:   You may see Nelva Bush, MD or one of the following Advanced Practice Providers on your designated Care Team:    Murray Hodgkins, NP  Christell Faith, PA-C  Marrianne Mood, PA-C  Cadence Kensington, [place]  Laurann Montana, NP  Other Instructions   Heart Failure, Diagnosis  Heart failure means that your heart is not able to pump blood in the right way. This makes it hard for your body to work well. Heart failure is usually a long-term (chronic) condition. You must take good care of yourself and follow your treatment plan from your doctor. What are the causes?  High blood pressure.  Buildup of cholesterol and fat in the arteries.  Heart attack. This injures the heart muscle.  Heart valves that do not open and close properly.  Damage of the heart muscle. This is also called cardiomyopathy.  Infection of the heart muscle. This is also called myocarditis.  Lung disease. What increases the risk?  Getting older. The risk of heart failure goes up as a person ages.  Being overweight.  Being male.  Use tobacco or nicotine products.  Abusing alcohol or drugs.  Having taken medicines that can damage the heart.  Having any of these conditions: ? Diabetes. ? Abnormal heart rhythms. ? Thyroid problems. ? Low blood counts (anemia).  Having a family history of heart failure. What are the signs or symptoms?  Shortness of breath.  Coughing.  Swelling of the feet, ankles, legs, or belly.  Losing or gaining weight for no reason.  Trouble breathing.  Waking from sleep because of the need to sit up and get more air.  Fast heartbeat.  Being very tired.  Feeling dizzy, or feeling like you may  pass out (faint).  Having no desire to eat.  Feeling like you may vomit (nauseous).  Peeing (urinating) more at night.  Feeling confused. How is this treated? This condition may be treated with:  Medicines. These can be given to treat blood pressure and to make the heart muscles stronger.  Changes in your daily life. These may include: ? Eating a healthy diet. ? Staying at a healthy body weight. ? Quitting tobacco, alcohol, and drug  use. ? Doing exercises. ? Participating in a cardiac rehabilitation program. This program helps you improve your health through exercise, education, and counseling.  Surgery. Surgery can be done to open blocked valves, or to put devices in the heart, such as pacemakers.  A donor heart (heart transplant). You will receive a healthy heart from a donor. Follow these instructions at home:  Treat other conditions as told by your doctor. These may include high blood pressure, diabetes, thyroid disease, or abnormal heart rhythms.  Learn as much as you can about heart failure.  Get support as you need it.  Keep all follow-up visits. Summary  Heart failure means that your heart is not able to pump blood in the right way.  This condition is often caused by high blood pressure, heart attack, or damage of the heart muscle.  Symptoms of this condition include shortness of breath and swelling of the feet, ankles, legs, or belly. You may also feel very tired or feel like you may vomit.  You may be treated with medicines, surgery, or changes in your daily life.  Treat other health conditions as told by your doctor. This information is not intended to replace advice given to you by your health care provider. Make sure you discuss any questions you have with your health care provider. Document Revised: 12/11/2019 Document Reviewed: 12/11/2019 Elsevier Patient Education  [place].

## 2020-08-03 NOTE — Progress Notes (Signed)
Office Visit    Patient Name: Aaron [name] Date of Encounter: 08/04/2020  Primary Care Provider:  Trinna Post, PA-C Primary Cardiologist:  Nelva Bush, MD Electrophysiologist:  None   Chief Complaint    Aaron [name] is a 55 y.o. male with a hx of HTN, kdiney stones, chronic back pain, depression, morbid obesity, first degree AV block, abnormal EKG  presents today for follow-up after cardiac catheterization.  Past Medical History    Past Medical History:  Diagnosis Date  . Chronic back pain    disc  . Coronary artery disease   . HFrEF (heart failure with reduced ejection fraction) ([place])   . Hypertension   . Kidney stone   . MDD (major depressive disorder)    Past Surgical History:  Procedure Laterality Date  . KIDNEY STONE SURGERY    . LEFT HEART CATH AND CORONARY ANGIOGRAPHY Left 06/27/2020   Procedure: LEFT HEART CATH AND CORONARY ANGIOGRAPHY;  Surgeon: Nelva Bush, MD;  Location: [facility];  Service: Cardiovascular;  Laterality: Left;  Marland Kitchen VASECTOMY      Allergies  No Known Allergies  History of Present Illness    Aaron [name] is a 55 y.o. male with a hx of HTN, kdiney stones, chronic back pain, depression, morbid obesity, first degree AV block, abnormal EKG last seen 07/07/20 by Dr. Saunders Revel  Stress test 01/04/20 with possible area of ischemia involving inferolateral wall. Discussed with Dr. Saunders Revel via telephone and requested to proceed with medical therapy and close outpatient follow up. He was started on Toprol 66m daily.  He had subsequent carotid duplex and echocardiogram.  Echo 01/19/2020 LVEF 55 to 60 percent, no wall motion abnormalities, grade 1 diastolic dysfunction, LA moderately dilated, mild MR.  Carotid duplex 01/19/2020 without evidence of thrombus, dissection, atherosclerotic plaque or stenosis in bilateral carotid arteries.  Lipid panel 11/17/19 with total cholesterol 117, HDL 31, triglycerides 119, LDL 64.   Seen in clinic  01/21/20. Noted stable DOE. He also noted palpitations which had improved somewhat since addition of Metoprolol. Imdur was added to his medication regimen for elevated BP.  He was seen in follow-up 02/11/2020 with palpitations improved though still occurring and as such ZIO monitoring was ordered.  ZIO showed predominantly normal sinus rhythm with a frequent PAC with burden of 13% and 42 atrial runs with longest 11 beats and up to 164 bpm.  Metoprolol was increased to 50 mg daily.  When seen in follow-up 03/22/2020 he noted some dizziness and he had marked first-degree AV block and as such metoprolol was reduced to 25 mg daily.  Noted he may require referral to EP for antiarrhythmic guidance.  He was recommended to consider cardiac catheterization as his myocardial perfusion stress test was inter immediate risk with possible inferolateral ischemia versus infarct.  He was seen 04/26/2020 recommended for cardiac catheterization which was scheduled.  However, he has had to be delayed due to COVID-19 infection.  He was diagnosed 05/10/2020.  He was hospitalized 05/11/2020 - 05/12/2020 and again 12/16-12/18/21.  He was treated with IV remdesivir and steroids.  He was discharged on home oxygen and tells me he has been able to wean off of it they did needed a few days ago. Underwent LHC 06/27/20 with mild nonobstructive coronary disease, moderately reduced LVEF 40%, mildly elevated filling pressures. He was started on Spironolactone 227mQD and recommended to escalate GDMT for nonischemic cardiomyopathy.   Seen by Dr. EnSaunders Revel/4/22 with increasing shortness of breath.  He was started on Lasix 81m daily and referred to pulmonology. He saw pulmonology 07/28/20 and was recommended for PFTs as well as sleep study.  Presents today for follow up with his wife. They recently moved into a new home and are excited. He reports stable intermittent chest discomfort. Reports some improvement in his breathing since last seen though still  dyspneic on exertion. Endorses eating low salt diet and drinking less than 2 liter of fluid per day. However, drinking Pepsi which we discussed is high in sodium. Long discussion regarding optimization of heart failure therapies.   EKGs/Labs/Other Studies Reviewed:   The following studies were reviewed today: LHC 12022-02-02Conclusions: 1. Mild, non-obstructive coronary artery disease. 2. Moderately reduced left ventricular systolic function (LVEF ~~27% with mildly elevated filling pressure (15-20 mmHg).   Recommendations: 1. Primary prevention of coronary artery disease. 2. Escalate goal-directed medical therapy for non-ischemic cardiomyopathy.  I will add spironolactone 25 mg daily with BMP within 1 week. 3. Follow-up in office in ~2 weeks.    Lexiscan Myoview 01/04/20 Abnormal pharmacologic myocardial perfusion stress test. There is a moderate in size, mild in severity, reversible defect involving the inferolateral myocardium that may reflect subtle ischemia but cannot rule out artifact. Left ventricular systolic function is low normal to mildly reduced by visual estimation and Siemens calculation (51%). QGS calculation of 35% is likely artificially low due to gating parameters. There is no significant coronary artery calcification on the attenuation correction CT. Sensitivity and specificity of this study are degraded by body habitus. This is an intermediate risk study.  Carotid Doppler 01/19/20 Summary:  Right Carotid: There was no evidence of thrombus, dissection,  atherosclerotic plaque or stenosis in the cervical carotid system.   Left Carotid: There was no evidence of thrombus, dissection,  atherosclerotic plaque or stenosis in the cervical carotid system.   Echo 01/19/20  1. Left ventricular ejection fraction, by estimation, is 55 to 60%. The  left ventricle has normal function. The left ventricle has no regional  wall motion abnormalities. Left ventricular diastolic parameters  are  consistent with Grade I diastolic  dysfunction (impaired relaxation).   2. Right ventricular systolic function is normal. The right ventricular  size is normal.   3. Left atrial size was moderately dilated.   4. Mild mitral valve regurgitation.   EKG:  No EKG is ordered today.  The ekg independently reviewed 03/30/2020 demonstrated demonstrates SR 77 bpm with 1st degree AV block (PR 296). Noted Prolonged QTc and stable TWI in anterolateral leads.  Recent Labs: 05/19/2020: B Natriuretic Peptide 181.7 05/20/2020: ALT 67; Magnesium 2.6 06/26/2020: Hemoglobin 15.5; Platelets 347 08/03/2020: BUN 14; Creatinine, Ser 1.20; Potassium 3.7; Sodium 143  Recent Lipid Panel    Component Value Date/Time   CHOL 96 05/19/2020 0500   CHOL 117 11/17/2019 1003   TRIG 70 05/19/2020 0500   HDL 27 (L) 05/19/2020 0500   HDL 31 (L) 11/17/2019 1003   CHOLHDL 3.6 05/19/2020 0500   VLDL 14 05/19/2020 0500   LDLCALC 55 05/19/2020 0500   LDLCALC 64 11/17/2019 1003    Home Medications   Current Meds  Medication Sig  . acetaminophen (TYLENOL) 325 MG tablet Take 650-975 mg by mouth every 6 (six) hours as needed for moderate pain or headache (for pain.).  .Marland Kitchenalbuterol (VENTOLIN HFA) 108 (90 Base) MCG/ACT inhaler Inhale 2 puffs into the lungs every 6 (six) hours as needed for wheezing or shortness of breath.  .Marland Kitchenazelastine (ASTELIN) 0.1 % nasal spray SMARTSIG:1-2  Spray(s) Both Nares Twice Daily  . celecoxib (CELEBREX) 100 MG capsule Take 1 capsule (100 mg total) by mouth daily as needed.  . cetirizine (ZYRTEC) 10 MG tablet Take 1 tablet (10 mg total) by mouth daily as needed (allergies.).  Marland Kitchen cyclobenzaprine (FLEXERIL) 10 MG tablet Take 10 mg by mouth at bedtime.  Marland Kitchen dexamethasone (DECADRON) 6 MG tablet dexamethasone 6 mg tablet  . fluticasone (FLONASE) 50 MCG/ACT nasal spray Place 2 sprays into both nostrils daily as needed (allergies.).  Marland Kitchen furosemide (LASIX) 40 MG tablet Take 1 tablet (40 mg total) by mouth  daily.  Marland Kitchen gabapentin (NEURONTIN) 600 MG tablet Take 600 mg by mouth 3 (three) times daily.  Marland Kitchen ibuprofen (ADVIL) 200 MG tablet Take 600 mg by mouth every 6 (six) hours as needed for headache.  . ipratropium (ATROVENT HFA) 17 MCG/ACT inhaler Inhale 2 puffs into the lungs every 6 (six) hours.  . isosorbide mononitrate (IMDUR) 30 MG 24 hr tablet Take 15 mg by mouth daily.  Marland Kitchen losartan (COZAAR) 100 MG tablet TAKE 1 TABLET BY MOUTH EVERY DAY  . meloxicam (MOBIC) 15 MG tablet meloxicam 15 mg tablet  . metoprolol succinate (TOPROL-XL) 50 MG 24 hr tablet Take 0.5 tablets (25 mg total) by mouth daily. Take with or immediately following a meal.  . montelukast (SINGULAIR) 10 MG tablet Take 1 tablet (10 mg total) by mouth at bedtime as needed (allergies.).  Marland Kitchen spironolactone (ALDACTONE) 25 MG tablet Take 1 tablet (25 mg total) by mouth daily.     Review of Systems    All other systems reviewed and are otherwise negative except as noted above.  Physical Exam    VS:  BP 122/84 (BP Location: Left Arm, Patient Position: Sitting, Cuff Size: Large)   Pulse 90   Ht 5' 9"  (1.753 m)   Wt (!) 304 lb (137.9 kg)   SpO2 97%   BMI 44.89 kg/m  , BMI Body mass index is 44.89 kg/m. GEN: Well nourished, overweight, well developed, in no acute distress. HEENT: normal. Neck: Supple, no carotid bruits or masses. Cardiac: RRR, no murmurs, rubs, or gallops. No clubbing, cyanosis, edema.  Radials/PT 2+ and equal bilaterally.  Respiratory:  Respirations regular and unlabored, clear to auscultation bilaterally. GI: Soft, nontender, nondistended. MS: No deformity or atrophy. Skin: Warm and dry, no rash. Neuro:  Strength and sensation are intact. Psych: Normal affect.  Assessment & Plan   1. Mild nonobstructive CAD by cardiac cath 06/27/20 - Persistent stable intermittent chest pain which is atypical for angina. No indication for repeat ischemic evaluation. GDMT includes beta blocker, statin, long acting nitrate. Heart  healthy diet and regular cardiovascular exercise encouraged.   2. HFrEF / Shortness of breath - Some improvement since addition of Lasix. GDMT includes Metoprolol, Spironolactone, Furosemide, Losartan. BMP today. If normal renal function, plan to add Dapagliflozin 68m QD for optimization of HF therapy. Educated on low salt diet and recommend he eliminate Pepsi. Recommend less than 2L of fluid per day. DOE likely multifactorial deconditioning, morbid obesity, HFrEF, s/p COVID 19. [facility] May also benefit from referral to cardiac rehab, discuss at follow up.  3. S/p COVID 19 - Continue to follow with pulmonology. PFT's upcoming.   4. Palpitations - Known PAC by previous monitor.  We have been unable to previously uptitrate his Toprol due to first-degree AV block and fatigue.   Continue present dose of Toprol 280mdaily.  5. HTN - BP well controlled. Continue current  antihypertensive regimen.   6. Morbid obesity - BMI >40.  Weight loss via diet and exercise encouraged.  7. OSA - Encouraged to complete sleep study as ordered by pulmonology.   Disposition: Follow up 4-6 weeks with Dr. Saunders Revel or APP.    Loel Dubonnet, NP 08/04/2020, 3:25 PM

## 2020-08-04 ENCOUNTER — Telehealth: Payer: Self-pay | Admitting: *Deleted

## 2020-08-04 DIAGNOSIS — I5022 Chronic systolic (congestive) heart failure: Secondary | ICD-10-CM

## 2020-08-04 LAB — BASIC METABOLIC PANEL
BUN/Creatinine Ratio: 12 (ref 9–20)
BUN: 14 mg/dL (ref 6–24)
CO2: 21 mmol/L (ref 20–29)
Calcium: 9.4 mg/dL (ref 8.7–10.2)
Chloride: 105 mmol/L (ref 96–106)
Creatinine, Ser: 1.2 mg/dL (ref 0.76–1.27)
Glucose: 122 mg/dL — ABNORMAL HIGH (ref 65–99)
Potassium: 3.7 mmol/L (ref 3.5–5.2)
Sodium: 143 mmol/L (ref 134–144)
eGFR: 72 mL/min/{1.73_m2} (ref >59)

## 2020-08-04 MED ORDER — DAPAGLIFLOZIN PROPANEDIOL 10 MG PO TABS: 10.0000 mg | ORAL_TABLET | Freq: Every day | ORAL | 5 refills | Status: DC

## 2020-08-04 NOTE — Telephone Encounter (Signed)
-----   Message from Loel Dubonnet, NP sent at 08/04/2020  8:28 AM EST ----- Normal kidney function and electrolytes. Recommend starting Dapagliflozin (Farxiga) 54m PO daily for optimization of heart failure (HFrEF) therapies. We discussed that this was likely next step in clinic visit, it will likely require a prior authorization so recommend he wait to pick up until prior auth processed. If we have samples, okay to offer if they wish to pick up.

## 2020-08-04 NOTE — Telephone Encounter (Signed)
Spoke to pt, notified of lab results and provider's recc.  Pt verbalized understanding and has no further questions regarding starting Farxiga.  Rx [facility] to [facility] [place] 23m PO Daily.  Pt aware to wait for prior auth to go through prior to picking up Rx.  Pt states he does not want to pick up samples at this time.  Asked pt to call me back if he has not heard back in approx 1 week.

## 2020-08-08 NOTE — Telephone Encounter (Signed)
Spoke with pharmacist at [facility] location) who states patient's insurance plan will cover Aaron [name], not Aaron [name]. Laurann Montana, PA is prescribing Aaron [name] as treatment for CHF therefore prior authorization has been initiated in [email or web address].  Patient has prescription drug plan through [facility]. ID# [id number] PCN: [id number] BIN: [id number] RxGrp: [id number] [email or web address] KEY: [id number] PA Case ID: [id number] 08/03/2020 office note attached  Per [email or web address]-this request has received an unfavorable outcome: Denied today Request Reference Number: [id number]. FARXIGA TAB 10MG is denied for not meeting the prior authorization requirement(s). Details of this decision are in the notice attached below or have been faxed to you. Appeals are not supported through [place]. Please refer to the fax case notice for appeals information and instructions.

## 2020-08-08 NOTE — Telephone Encounter (Signed)
Farxiga denial letter from [employer] and given to Laurann Montana, PA for review.

## 2020-08-13 ENCOUNTER — Other Ambulatory Visit: Payer: Self-pay | Admitting: Family

## 2020-08-18 MED ORDER — EMPAGLIFLOZIN 10 MG PO TABS: 10.0000 mg | ORAL_TABLET | Freq: Every day | ORAL | 5 refills | Status: DC

## 2020-08-18 NOTE — Addendum Note (Signed)
Addended by: Loel Dubonnet on: 08/18/2020 01:24 PM   Modules accepted: Orders

## 2020-08-18 NOTE — Telephone Encounter (Signed)
Recommend SGLT2i for optimization of HF therapy. Insurance will not cover Wilder Glade - as such, we will discontinue [place]. Will utilize SGLT2i, Jardiance, as replacement as it has HF benefit.   Recommend starting Jardiance 31m once daily. Rx sent to patient pharmacy.   CLoel Dubonnet NP

## 2020-08-21 NOTE — Telephone Encounter (Signed)
Spoke to pt, made aware of medication changes and provider's recc. Pt verbalized understanding, he will start Jardiance 23m once daily and follow up as scheduled on 4/8.  Pt has no further questions at this time.

## 2020-08-25 ENCOUNTER — Telehealth: Payer: Self-pay

## 2020-08-25 NOTE — Telephone Encounter (Signed)
Patient is aware of date/time of covid test prior to PFT.  

## 2020-08-28 ENCOUNTER — Other Ambulatory Visit
Admission: RE | Admit: 2020-08-28 | Discharge: 2020-08-28 | Disposition: A | Discharge status: Home / Self Care | Payer: Medicare Other | Source: Ambulatory Visit | Attending: Adult Health | Admitting: Adult Health

## 2020-08-28 ENCOUNTER — Other Ambulatory Visit: Payer: Self-pay

## 2020-08-28 DIAGNOSIS — Z01812 Encounter for preprocedural laboratory examination: Secondary | ICD-10-CM | POA: Insufficient documentation

## 2020-08-28 DIAGNOSIS — Z20822 Contact with and (suspected) exposure to covid-19: Secondary | ICD-10-CM | POA: Insufficient documentation

## 2020-08-29 ENCOUNTER — Other Ambulatory Visit: Payer: Self-pay

## 2020-08-29 ENCOUNTER — Ambulatory Visit: Payer: Medicare Other | Attending: Adult Health

## 2020-08-29 DIAGNOSIS — R0602 Shortness of breath: Secondary | ICD-10-CM | POA: Diagnosis present

## 2020-08-29 LAB — SARS CORONAVIRUS 2 (TAT 6-24 HRS): SARS Coronavirus 2: NEGATIVE

## 2020-08-29 MED ORDER — ALBUTEROL SULFATE (2.5 MG/3ML) 0.083% IN NEBU
2.5000 mg | INHALATION_SOLUTION | Freq: Once | RESPIRATORY_TRACT | Status: AC
  Administered 2020-08-29: 2.5 mg via RESPIRATORY_TRACT
  Filled 2020-08-29: qty 3

## 2020-09-05 ENCOUNTER — Telehealth: Payer: Self-pay | Admitting: Adult Health

## 2020-09-05 ENCOUNTER — Other Ambulatory Visit: Payer: Self-pay | Admitting: Internal Medicine

## 2020-09-05 ENCOUNTER — Other Ambulatory Visit: Payer: Self-pay | Admitting: Physician Assistant

## 2020-09-05 DIAGNOSIS — J329 Chronic sinusitis, unspecified: Secondary | ICD-10-CM

## 2020-09-05 MED ORDER — ALBUTEROL SULFATE HFA 108 (90 BASE) MCG/ACT IN AERS: 2.0000 | INHALATION_SPRAY | Freq: Four times a day (QID) | RESPIRATORY_TRACT | 5 refills | Status: DC | PRN

## 2020-09-05 NOTE — Telephone Encounter (Signed)
Pulmonary function testing showed moderate restrictive lung disease with no significant airflow obstruction. DLCO was normal at 89%. We will discuss in detail at follow-up visit.  Patient is aware of results and voiced his understanding. Patient is due for an appt. Schedule is not open for May.  Rx for ventolin has been sent to preferred pharmacy as requested.  Will leave encounter open to ensure f/u.

## 2020-09-05 NOTE — Progress Notes (Signed)
Called and spoke with patient, advised of results/recommendations per Rexene Edison NP.  He verbalized understanding.  Follow appointment schedule with Tammy for 09/25/20 at 3 pm, advised to arrive at 2:45 pm.  Nothing further needed.

## 2020-09-08 ENCOUNTER — Encounter: Payer: Self-pay | Admitting: Family

## 2020-09-08 ENCOUNTER — Ambulatory Visit (INDEPENDENT_AMBULATORY_CARE_PROVIDER_SITE_OTHER): Payer: Medicare Other | Admitting: Family

## 2020-09-08 ENCOUNTER — Other Ambulatory Visit: Payer: Self-pay

## 2020-09-08 VITALS — BP 120/98 | HR 70 | Ht 69.0 in | Wt 308.0 lb

## 2020-09-08 DIAGNOSIS — I25118 Atherosclerotic heart disease of native coronary artery with other forms of angina pectoris: Secondary | ICD-10-CM | POA: Diagnosis not present

## 2020-09-08 DIAGNOSIS — I5022 Chronic systolic (congestive) heart failure: Secondary | ICD-10-CM | POA: Diagnosis not present

## 2020-09-08 DIAGNOSIS — I10 Essential (primary) hypertension: Secondary | ICD-10-CM | POA: Diagnosis not present

## 2020-09-08 DIAGNOSIS — Z8616 Personal history of COVID-19: Secondary | ICD-10-CM

## 2020-09-08 DIAGNOSIS — G4733 Obstructive sleep apnea (adult) (pediatric): Secondary | ICD-10-CM

## 2020-09-08 MED ORDER — ENTRESTO 49-51 MG PO TABS: 1.0000 | ORAL_TABLET | Freq: Two times a day (BID) | ORAL | 5 refills | Status: DC

## 2020-09-08 NOTE — Progress Notes (Signed)
Office Visit    Patient Name: [name] Date of Encounter: 09/08/2020  Primary Care Provider:  Trinna Post, PA-C Primary Cardiologist:  Nelva Bush, MD Electrophysiologist:  None   Chief Complaint    Aaron [name] is a 55 y.o. male with a hx of HFrEF, HTN, kdiney stones, chronic back pain, depression, morbid obesity, first degree AV block, abnormal EKG  presents today for follow-up of heart failure  Past Medical History    Past Medical History:  Diagnosis Date  . Chronic back pain    disc  . Coronary artery disease   . HFrEF (heart failure with reduced ejection fraction) ([place])   . Hypertension   . Kidney stone   . MDD (major depressive disorder)    Past Surgical History:  Procedure Laterality Date  . KIDNEY STONE SURGERY    . LEFT HEART CATH AND CORONARY ANGIOGRAPHY Left 06/27/2020   Procedure: LEFT HEART CATH AND CORONARY ANGIOGRAPHY;  Surgeon: Nelva Bush, MD;  Location: [place] [facility];  Service: Cardiovascular;  Laterality: Left;  Marland Kitchen VASECTOMY      Allergies  No Known Allergies  History of Present Illness    Aaron [name] is a 55 y.o. male with a hx of HFrEF, HTN, kdiney stones, chronic back pain, depression, morbid obesity, first degree AV block, abnormal EKG last seen 08/03/20.  Stress test 01/04/20 with possible area of ischemia involving inferolateral wall. Discussed with Dr. Saunders Revel via telephone and requested to proceed with medical therapy and close outpatient follow up. He was started on Toprol 75m daily.  He had subsequent carotid duplex and echocardiogram.  Echo 01/19/2020 LVEF 55 to 60 percent, no wall motion abnormalities, grade 1 diastolic dysfunction, LA moderately dilated, mild MR.  Carotid duplex 01/19/2020 without evidence of thrombus, dissection, atherosclerotic plaque or stenosis in bilateral carotid arteries.  Lipid panel 11/17/19 with total cholesterol 117, HDL 31, triglycerides 119, LDL 64.   Seen in clinic 01/21/20. Noted  stable DOE. He also noted palpitations which had improved somewhat since addition of Metoprolol. Imdur was added to his medication regimen for elevated BP.  He was seen in follow-up 02/11/2020 with palpitations improved though still occurring and as such ZIO monitoring was ordered.  ZIO showed predominantly normal sinus rhythm with a frequent PAC with burden of 13% and 42 atrial runs with longest 11 beats and up to 164 bpm.  Metoprolol was increased to 50 mg daily.  When seen in follow-up 03/22/2020 he noted some dizziness and he had marked first-degree AV block and as such metoprolol was reduced to 25 mg daily.  Noted he may require referral to EP for antiarrhythmic guidance.  He was recommended to consider cardiac catheterization as his myocardial perfusion stress test was inter immediate risk with possible inferolateral ischemia versus infarct.  He was seen 04/26/2020 recommended for cardiac catheterization which was scheduled.  However, he has had to be delayed due to COVID-19 infection.  He was diagnosed 05/10/2020.  He was hospitalized 05/11/2020 - 05/12/2020 and again 12/16-12/18/21.  He was treated with IV remdesivir and steroids.  He was discharged on home oxygen and tells me he has been able to wean off of it they did needed a few days ago. Underwent LHC 06/27/20 with mild nonobstructive coronary disease, moderately reduced LVEF 40%, mildly elevated filling pressures. He was started on Spironolactone 241mQD and recommended to escalate GDMT for nonischemic cardiomyopathy.   Seen in follow-up 08/03/2020.  He and his wife had moved  into a new home and very excited.  He was started on Jardiance 10 mg daily for optimization of heart failure therapies.Since last seen he had pulmonary function testing showing moderate restrictive lung disease with no significant airflow obstruction.  He was started on Ventolin PRN.  Presents today for follow up.  His weight is up 4 pounds since last clinic visit.  Tells me he  has made improvements in his diet.  Reports stable dyspnea on exertion.  Reports no edema, orthopnea, PND.  He has not yet completed sleep study testing through pulmonology.  Tells me he worries about having his wife spend the night by herself and we discussed gust again the importance of sleep study testing and that it would likely help increase his heart pumping function as well as improved symptoms of fatigue.   EKGs/Labs/Other Studies Reviewed:   The following studies were reviewed today: LHC 07/13/20 Conclusions: 1. Mild, non-obstructive coronary artery disease. 2. Moderately reduced left ventricular systolic function (LVEF ~85%) with mildly elevated filling pressure (15-20 mmHg).   Recommendations: 1. Primary prevention of coronary artery disease. 2. Escalate goal-directed medical therapy for non-ischemic cardiomyopathy.  I will add spironolactone 25 mg daily with BMP within 1 week. 3. Follow-up in office in ~2 weeks.    Lexiscan Myoview 01/04/20 Abnormal pharmacologic myocardial perfusion stress test. There is a moderate in size, mild in severity, reversible defect involving the inferolateral myocardium that may reflect subtle ischemia but cannot rule out artifact. Left ventricular systolic function is low normal to mildly reduced by visual estimation and Siemens calculation (51%). QGS calculation of 35% is likely artificially low due to gating parameters. There is no significant coronary artery calcification on the attenuation correction CT. Sensitivity and specificity of this study are degraded by body habitus. This is an intermediate risk study.  Carotid Doppler 01/19/20 Summary:  Right Carotid: There was no evidence of thrombus, dissection,  atherosclerotic plaque or stenosis in the cervical carotid system.   Left Carotid: There was no evidence of thrombus, dissection,  atherosclerotic plaque or stenosis in the cervical carotid system.   Echo 01/19/20  1. Left ventricular  ejection fraction, by estimation, is 55 to 60%. The  left ventricle has normal function. The left ventricle has no regional  wall motion abnormalities. Left ventricular diastolic parameters are  consistent with Grade I diastolic  dysfunction (impaired relaxation).   2. Right ventricular systolic function is normal. The right ventricular  size is normal.   3. Left atrial size was moderately dilated.   4. Mild mitral valve regurgitation.   EKG:  No EKG is ordered today.  The ekg independently reviewed 03/30/2020 demonstrated demonstrates SR 77 bpm with 1st degree AV block (PR 296). Noted Prolonged QTc and stable TWI in anterolateral leads.  Recent Labs: 05/19/2020: B Natriuretic Peptide 181.7 05/20/2020: ALT 67; Magnesium 2.6 06/26/2020: Hemoglobin 15.5; Platelets 347 08/03/2020: BUN 14; Creatinine, Ser 1.20; Potassium 3.7; Sodium 143  Recent Lipid Panel    Component Value Date/Time   CHOL 96 05/19/2020 0500   CHOL 117 11/17/2019 1003   TRIG 70 05/19/2020 0500   HDL 27 (L) 05/19/2020 0500   HDL 31 (L) 11/17/2019 1003   CHOLHDL 3.6 05/19/2020 0500   VLDL 14 05/19/2020 0500   LDLCALC 55 05/19/2020 0500   LDLCALC 64 11/17/2019 1003    Home Medications   Current Meds  Medication Sig  . acetaminophen (TYLENOL) 325 MG tablet Take 650-975 mg by mouth every 6 (six) hours as needed for moderate  pain or headache (for pain.).  Marland Kitchen albuterol (VENTOLIN HFA) 108 (90 Base) MCG/ACT inhaler Inhale 2 puffs into the lungs every 6 (six) hours as needed for wheezing or shortness of breath.  . celecoxib (CELEBREX) 100 MG capsule Take 1 capsule (100 mg total) by mouth daily as needed.  . cetirizine (ZYRTEC) 10 MG tablet Take 1 tablet (10 mg total) by mouth daily as needed (allergies.).  Marland Kitchen cyclobenzaprine (FLEXERIL) 10 MG tablet Take 10 mg by mouth at bedtime.  . empagliflozin (JARDIANCE) 10 MG TABS tablet Take 1 tablet (10 mg total) by mouth daily before breakfast.  . fluticasone (FLONASE) 50 MCG/ACT  nasal spray Place 2 sprays into both nostrils daily as needed (allergies.).  Marland Kitchen furosemide (LASIX) 40 MG tablet Take 1 tablet (40 mg total) by mouth daily.  Marland Kitchen gabapentin (NEURONTIN) 600 MG tablet Take 600 mg by mouth 3 (three) times daily.  Marland Kitchen ibuprofen (ADVIL) 200 MG tablet Take 600 mg by mouth every 6 (six) hours as needed for headache.  . ipratropium (ATROVENT HFA) 17 MCG/ACT inhaler Inhale 2 puffs into the lungs every 6 (six) hours.  . isosorbide mononitrate (IMDUR) 30 MG 24 hr tablet Take 15 mg by mouth daily.  Marland Kitchen losartan (COZAAR) 100 MG tablet TAKE 1 TABLET BY MOUTH EVERY DAY  . meloxicam (MOBIC) 15 MG tablet meloxicam 15 mg tablet  . metoprolol succinate (TOPROL-XL) 50 MG 24 hr tablet Take 0.5 tablets (25 mg total) by mouth daily.  . montelukast (SINGULAIR) 10 MG tablet Take 1 tablet (10 mg total) by mouth at bedtime as needed (allergies.).  Marland Kitchen spironolactone (ALDACTONE) 25 MG tablet Take 1 tablet (25 mg total) by mouth daily.     Review of Systems    All other systems reviewed and are otherwise negative except as noted above.  Physical Exam   VS:  BP (!) 120/98 (BP Location: Left Arm, Patient Position: Sitting, Cuff Size: Large)   Pulse 70   Ht 5' 9"  (1.753 m)   Wt (!) 308 lb (139.7 kg)   SpO2 96%   BMI 45.48 kg/m  , BMI Body mass index is 45.48 kg/m. GEN: Well nourished, overweight, well developed, in no acute distress. HEENT: normal. Neck: Supple, no carotid bruits or masses. Cardiac: RRR, no murmurs, rubs, or gallops. No clubbing, cyanosis, edema.  Radials/PT 2+ and equal bilaterally.  Respiratory:  Respirations regular and unlabored.  Diminished bases bilaterally likely due to body habitus. GI: Soft, nontender, nondistended. MS: No deformity or atrophy. Skin: Warm and dry, no rash. Neuro:  Strength and sensation are intact. Psych: Normal affect.  Assessment & Plan   1. Mild nonobstructive CAD by cardiac cath 06/27/20 - Persistent stable intermittent chest pain which is  atypical for angina. No indication for repeat ischemic evaluation. GDMT includes beta blocker, statin, long acting nitrate. Heart healthy diet and regular cardiovascular exercise encouraged.   2. HFrEF / Shortness of breath -  GDMT includes Metoprolol, Jardiance, Spironolactone, Furosemide.  For optimization of medical therapy we will stop losartan 100 mg daily and start Entresto 49 to 51 mg twice daily.  Free 30-day coupon card provided.  His weight is noted to be up 4 pounds since visit 1 month ago.  Increase Lasix to 40 mg twice daily x3 days then return to 40 mg daily. BMP in 2 weeks for monitoring. Educated on low salt diet and less than 2L of fluid per day. DOE likely multifactorial deconditioning, morbid obesity, HFrEF, s/p COVID 19. May also benefit from  referral to cardiac rehab, discuss at follow up.  3. S/p COVID 19 -likely contributory to dyspnea.  Continue to follow with pulmonology.  PFTs showed moderate restriction.  Started on as needed Ventolin.  Follow-up with them in 2 weeks as scheduled.  4. Palpitations - Known PAC by previous monitor.  Reports occasional tachypalpitations.  We have been unable to previously uptitrate his Toprol due to first-degree AV block and fatigue.  Anticipate this will improve as with treatment of sleep apnea.  Encouraged to avoid triggers such as dehydration and caffeine.  Continue present dose of Toprol 64m daily.  5. HTN - BP well controlled. Continue current antihypertensive regimen.   6. Morbid obesity - BMI >40.  Weight loss via diet and exercise encouraged.  Discussed the implications of this with his heart failure as well as coronary disease.  7. OSA - Encouraged to complete sleep study as ordered by pulmonology.  Discussed the implications of heart failure as well as palpitations.  He has known sleep apnea and previously had difficulties with mask.  Reassurance provided that the masks are much improved compared to many years ago.  Upcoming appointment  09/25/2020 with pulmonology.  Disposition: Follow up in 1 month(s) with Dr. ESaunders Revelor APP for reassessment after addition of Entresto.  CLoel Dubonnet NP 09/08/2020, 9:16 AM

## 2020-09-08 NOTE — Patient Instructions (Addendum)
Medication Instructions:  Your physician has recommended you make the following change in your medication:   CHANGE Furosemide to 33m twice daily (in the morning and at lunch) for 3 days  THEN change back to Furosemide 479monce daily  STOP Losartan  START Entresto [id number] daily  *If you need a refill on your cardiac medications before your next appointment, please call your pharmacy*  Lab Work: Your physician recommends that you return for lab work in: 2 weeks for [facility] desk on the right to check in, past the screening table Lab hours: Monday- Friday (7:30 am- 5:30 pm)   Testing/Procedures: None ordered today.   Follow-Up: [facility] and your health needs are our priority.  As part of our continuing mission to provide you with exceptional heart care, we have created designated Provider Care Teams.  These Care Teams include your primary Cardiologist (physician) and Advanced Practice Providers (APPs -  Physician Assistants and Nurse Practitioners) who all work together to provide you with the care you need, when you need it.  We recommend signing up for the patient portal called "MyChart".  Sign up information is provided on this After Visit Summary.  MyChart is used to connect with patients for Virtual Visits (Telemedicine).  Patients are able to view lab/test results, encounter notes, upcoming appointments, etc.  Non-urgent messages can be sent to your provider as well.   To learn more about what you can do with MyChart, go to [email or web address]  Your next appointment:   1 month(s)  The format for your next appointment:   In Person  Provider:   You may see ChrNelva BushD or one of the following Advanced Practice Providers on your designated Care Team:    ChrMurray HodgkinsP  RyaChristell FaithA-C  JacMarrianne MoodA-C  Cadence FurKathlen ModyA-VermontaiLaurann MontanaP  Other Instructions  Heart Healthy Diet  Recommendations: A low-salt diet is recommended. Meats should be grilled, baked, or boiled. Avoid fried foods. Focus on lean protein sources like fish or chicken with vegetables and fruits. The American Heart Association is a GREMicrobiologistAmerican Heart Association Diet and Lifeystyle Recommendations   Exercise recommendations: The American Heart Association recommends 150 minutes of moderate intensity exercise weekly. Try 30 minutes of moderate intensity exercise 4-5 times per week. This could include walking, jogging, or swimming.

## 2020-09-08 NOTE — Telephone Encounter (Signed)
appt scheduled for 10/17/2020 at 3:00. Patient is aware and voiced his understanding.  Nothing further needed.

## 2020-09-11 ENCOUNTER — Telehealth: Payer: Self-pay | Admitting: Family

## 2020-09-11 NOTE — Telephone Encounter (Signed)
We started pt on Entresto 49 to 51 mg twice daily office visit on 09/08/20.  He was given 30 day free trial coupon but said that he did not use it because he asked the pharmacy how much the med would be after the 30 day trial and it would be $300. So he chose not to start at all since he could not maintain.  I did offer pt assistance application and pt felt sure he would not be approved. Pt states he has not started Entresto at all for these reasons and has continue Losartan 177m daily.   Will forward to provider for further recc.

## 2020-09-11 NOTE — Telephone Encounter (Signed)
Contacted [facility] who said the cost would be $300 regardless of prior authorization. [place], his insurance provider, who said that he has a $2500 out-of-pocket has only met $149.27.  He will not receive a reduced price on this medication until he reaches that $2500 out-of-pocket per my discussion with the representative at [facility]. It would be $300 for 30-day supply at retail pharmacy or through mail order.   As he is not interested in patient assistance and the medication is not affordable based on his present [facility] plan, agreed to continue Losartan 100 mg daily.  Please provide refills and update medication list.  Thank you! Loel Dubonnet, NP

## 2020-09-11 NOTE — Telephone Encounter (Signed)
Pt c/o medication issue:  1. Name of Medication: Entresto   2. How are you currently taking this medication (dosage and times per day)? new  3. Are you having a reaction (difficulty breathing--STAT)? no  4. What is your medication issue? Not affordable please call

## 2020-09-12 MED ORDER — LOSARTAN POTASSIUM 100 MG PO TABS: 100.0000 mg | ORAL_TABLET | Freq: Every day | ORAL | 3 refills | Status: DC

## 2020-09-12 NOTE — Telephone Encounter (Signed)
Spoke to pt to make aware I updated [facility] and that d/t cost issue below, we will continue Losartan 163m daily and will discontinue Entresto. Med list updated. Refills sent for Losartan. Pt has no further questions at this time.

## 2020-09-25 ENCOUNTER — Ambulatory Visit: Payer: Medicare Other | Admitting: Adult Health

## 2020-10-02 ENCOUNTER — Other Ambulatory Visit: Payer: Self-pay | Admitting: Internal Medicine

## 2020-10-17 ENCOUNTER — Encounter: Payer: Self-pay | Admitting: Adult Health

## 2020-10-17 ENCOUNTER — Other Ambulatory Visit: Payer: Self-pay

## 2020-10-17 ENCOUNTER — Ambulatory Visit (INDEPENDENT_AMBULATORY_CARE_PROVIDER_SITE_OTHER): Payer: Medicare Other | Admitting: Adult Health

## 2020-10-17 DIAGNOSIS — I2510 Atherosclerotic heart disease of native coronary artery without angina pectoris: Secondary | ICD-10-CM

## 2020-10-17 DIAGNOSIS — R0602 Shortness of breath: Secondary | ICD-10-CM

## 2020-10-17 DIAGNOSIS — G4733 Obstructive sleep apnea (adult) (pediatric): Secondary | ICD-10-CM | POA: Diagnosis not present

## 2020-10-17 MED ORDER — BREO ELLIPTA 100-25 MCG/INH IN AEPB: 1.0000 | INHALATION_SPRAY | Freq: Once | RESPIRATORY_TRACT | 0 refills | Status: AC

## 2020-10-17 NOTE — Progress Notes (Signed)
@Patient  ID: Aaron [name], male    DOB: 09-Mar-1966, 55 y.o.   MRN: [id number]  Chief Complaint  Patient presents with  . Follow-up    Referring provider: Paulene Floor  HPI: 55 year old male never smoker seen for pulmonary and sleep consult July 28, 2020 for sleep apnea and shortness of breath post COVID-19 infection and COVID-pneumonia in December 2021 Medical history significant for hypertension, cardiomyopathy, A. fib, depression, anxiety, ADD and chronic back pain  TEST/EVENTS :  Admitted December 2021 with acute respiratory failure with hypoxemia, ALPFX-90 infection complicated by Covid pneumonia treated with remdesivir and IV steroids discharged on oxygen  CT chest May 11, 2020 mild patchy groundglass opacities bilaterally in the lungs.  Negative for PE  Left heart cath and coronary angiography June 27, 2020 mild nonobstructive coronary disease, moderately reduced LVEF around 40%  10/17/2020 Follow up : OSA, shortness of breath Patient returns for 2-monthfollow-up.  Patient was seen last visit for a consult for sleep apnea and ongoing dyspnea status post COVID-19 infection in December 2021.  Patient previously been diagnosed with sleep apnea in the past.  Has not been using his CPAP for the last several years.  Has ongoing daytime sleepiness, patient was recommended for a split-night sleep study.  Unfortunately patient had to cancel his study twice and has rescheduled for later in July.  Patient complained of ongoing dyspnea after he had COVID-19 infection and COVID-pneumonia in December 2021.  Patient did require hospitalization he was treated with remdesivir and IV steroids.  He didrequire oxygen at discharge.  Previous CT chest showed mild patchy groundglass opacities and was negative for PE.  After discharge home patient was able to slowly wean off of his oxygen. Since last visit patient says he is feeling some better but gets short of breath with  activities.  Has low activity tolerance.  Patient has multiple medical problems including congestive heart failure, chronic back pain.  Patient has tried albuterol in the past but unclear if it is helped at all.  Patient does have chronic allergies is on Singulair and Flonase. He was set up for pulmonary function testing that was completed August 29, 2020 that showed moderate restriction with an FEV1 at 69%, ratio 76, FVC 70%, no significant bronchodilator response, DLCO 89%    [place].    No Known Allergies  Immunization History  Administered Date(s) Administered  . Influenza,inj,Quad PF,6+ Mos 05/20/2018, 06/10/2020  . Moderna Sars-Covid-2 Vaccination 06/09/2020  . Tdap 12/14/2019    Past Medical History:  Diagnosis Date  . Chronic back pain    disc  . Coronary artery disease   . HFrEF (heart failure with reduced ejection fraction) ([place]   . Hypertension   . Kidney stone   . MDD (major depressive disorder)     Tobacco History: Social History   Tobacco Use  Smoking Status Never Smoker  Smokeless Tobacco Never Used   Counseling given: Not Answered   Outpatient Medications Prior to Visit  Medication Sig Dispense Refill  . acetaminophen (TYLENOL) 325 MG tablet Take 650-975 mg by mouth every 6 (six) hours as needed for moderate pain or headache (for pain.).    .Marland Kitchenalbuterol (VENTOLIN HFA) 108 (90 Base) MCG/ACT inhaler Inhale 2 puffs into the lungs every 6 (six) hours as needed for wheezing or shortness of breath. 18 g 5  . celecoxib (CELEBREX) 100 MG capsule Take 1 capsule (100 mg total) by mouth daily  as needed.    . cetirizine (ZYRTEC) 10 MG tablet Take 1 tablet (10 mg total) by mouth daily as needed (allergies.).    Marland Kitchen cyclobenzaprine (FLEXERIL) 10 MG tablet Take 10 mg by mouth at bedtime.    . empagliflozin (JARDIANCE) 10 MG TABS tablet Take 1 tablet (10 mg total) by mouth daily before breakfast. 30 tablet 5  . fluticasone  (FLONASE) 50 MCG/ACT nasal spray Place 2 sprays into both nostrils daily as needed (allergies.).    Marland Kitchen furosemide (LASIX) 40 MG tablet TAKE 1 TABLET BY MOUTH EVERY DAY 90 tablet 0  . gabapentin (NEURONTIN) 600 MG tablet Take 600 mg by mouth 3 (three) times daily.    Marland Kitchen ibuprofen (ADVIL) 200 MG tablet Take 600 mg by mouth every 6 (six) hours as needed for headache.    . ipratropium (ATROVENT HFA) 17 MCG/ACT inhaler Inhale 2 puffs into the lungs every 6 (six) hours.    . isosorbide mononitrate (IMDUR) 30 MG 24 hr tablet Take 15 mg by mouth daily.    Marland Kitchen losartan (COZAAR) 100 MG tablet Take 1 tablet (100 mg total) by mouth daily. 90 tablet 3  . meloxicam (MOBIC) 15 MG tablet meloxicam 15 mg tablet    . metoprolol succinate (TOPROL-XL) 50 MG 24 hr tablet Take 0.5 tablets (25 mg total) by mouth daily. 90 tablet 0  . montelukast (SINGULAIR) 10 MG tablet Take 1 tablet (10 mg total) by mouth at bedtime as needed (allergies.).    Marland Kitchen spironolactone (ALDACTONE) 25 MG tablet Take 1 tablet (25 mg total) by mouth daily. 30 tablet 5   No facility-administered medications prior to visit.     Review of Systems:   Constitutional:   No  weight loss, night sweats,  Fevers, chills,  +fatigue, or  lassitude.  HEENT:   No headaches,  Difficulty swallowing,  Tooth/dental problems, or  Sore throat,                No sneezing, itching, ear ache,  +nasal congestion, post nasal drip,   CV:  No chest pain,  Orthopnea, PND, swelling in lower extremities, anasarca, dizziness, palpitations, syncope.   GI  No heartburn, indigestion, abdominal pain, nausea, vomiting, diarrhea, change in bowel habits, loss of appetite, bloody stools.   Resp:    No chest wall deformity  Skin: no rash or lesions.  GU: no dysuria, change in color of urine, no urgency or frequency.  No flank pain, no hematuria   MS:  No joint pain or swelling.  No decreased range of motion.  No back pain.    Physical Exam  BP 130/80 (BP Location: Left  Arm, Patient Position: Sitting, Cuff Size: Normal)   Pulse 73   Temp (!) 97.1 F (36.2 C) (Temporal)   Ht 5' 9"  (1.753 m)   Wt (!) 302 lb (137 kg)   SpO2 98%   BMI 44.60 kg/m   GEN: A/Ox3; pleasant , NAD, well nourished    HEENT:  [place]/AT,   NOSE-clear, THROAT-clear, no lesions, no postnasal drip or exudate noted.   NECK:  Supple w/ fair ROM; no JVD; normal carotid impulses w/o bruits; no thyromegaly or nodules palpated; no lymphadenopathy.    RESP  Clear  P & A; w/o, wheezes/ rales/ or rhonchi. no accessory muscle use, no dullness to percussion  CARD:  RRR, no m/r/g, tr  peripheral edema, pulses intact, no cyanosis or clubbing.  GI:   Soft & nt; nml bowel sounds; no organomegaly or  masses detected.   Musco: Warm bil, no deformities or joint swelling noted.   Neuro: alert, no focal deficits noted.    Skin: Warm, no lesions or rashes    Lab Results:  CBC   ProBNP No results found for: PROBNP  Imaging: No results found.  albuterol (PROVENTIL) (2.5 MG/3ML) 0.083% nebulizer solution 2.5 mg    Date Action Dose Route User   08/29/2020 1544 Given 2.5 mg Nebulization Dwaine Deter H, RT      No flowsheet data found.  No results found for: NITRICOXIDE      Assessment & Plan:   Obstructive sleep apnea Previous diagnosis of obstructive sleep apnea.  Patient has multiple symptoms including daytime sleepiness snoring restless sleep and risk factors of obesity. Patient has been set up for split-night sleep study.  This been delayed due to rescheduling.  Encourage patient to keep appointment in July as planned.  Once once results are back will go forward treatment options   Morbid obesity ([place]) Healthy weight loss.  Long discussion regarding weight loss.  Did discuss a possible referral to healthy weight and wellness patient will hold off at this time.  Shortness of breath Dyspnea suspect is multifactorial in nature   Follow-up chest x-ray showed improvement with  resolution of pulmonary infiltrates. Pulmonary function testing shows moderate restriction probably related to his obesity.  DLCO was normal.  Hold off on additional imaging at this time. Patient may have a component of some mild intermittent asthma trial of Breo to see if this might help.  Although he had no bronchodilator response on PFTs.  Plan  Patient Instructions  Go for Split night sleep study .  Healthy sleep regimen  Do not drive if sleepy  Avoid sedating meds if able .  Trial of BREO 1 puff daily , rinse after use.  Mucinex DM Twice daily  As needed  Cough /congestion  Albuterol inhaler As needed   Follow up with in 3 months Dr. Patsey Berthold or Zaccheaus Storlie NP and As needed          Rexene Edison, NP 10/17/2020

## 2020-10-17 NOTE — Assessment & Plan Note (Signed)
>>  ASSESSMENT AND PLAN FOR MORBID OBESITY (HCC) WRITTEN ON 10/17/2020  3:25 PM BY PARRETT, TAMMY S, NP  Healthy weight loss.  Long discussion regarding weight loss.  Did discuss a possible referral to healthy weight and wellness patient will hold off at this time.

## 2020-10-17 NOTE — Assessment & Plan Note (Signed)
Dyspnea suspect is multifactorial in nature   Follow-up chest x-ray showed improvement with resolution of pulmonary infiltrates. Pulmonary function testing shows moderate restriction probably related to his obesity.  DLCO was normal.  Hold off on additional imaging at this time. Patient may have a component of some mild intermittent asthma trial of Breo to see if this might help.  Although he had no bronchodilator response on PFTs.  Plan  Patient Instructions  Go for Split night sleep study .  Healthy sleep regimen  Do not drive if sleepy  Avoid sedating meds if able .  Trial of BREO 1 puff daily , rinse after use.  Mucinex DM Twice daily  As needed  Cough /congestion  Albuterol inhaler As needed   Follow up with in 3 months Dr. Patsey Berthold or Jentry Mcqueary NP and As needed

## 2020-10-17 NOTE — Assessment & Plan Note (Signed)
Previous diagnosis of obstructive sleep apnea.  Patient has multiple symptoms including daytime sleepiness snoring restless sleep and risk factors of obesity. Patient has been set up for split-night sleep study.  This been delayed due to rescheduling.  Encourage patient to keep appointment in July as planned.  Once once results are back will go forward treatment options

## 2020-10-17 NOTE — Patient Instructions (Signed)
Go for Split night sleep study .  Healthy sleep regimen  Do not drive if sleepy  Avoid sedating meds if able .  Trial of BREO 1 puff daily , rinse after use.  Mucinex DM Twice daily  As needed  Cough /congestion  Albuterol inhaler As needed   Follow up with in 3 months Dr. Patsey Berthold or Sharmel Ballantine NP and As needed

## 2020-10-17 NOTE — Assessment & Plan Note (Signed)
Healthy weight loss.  Long discussion regarding weight loss.  Did discuss a possible referral to healthy weight and wellness patient will hold off at this time.

## 2020-10-20 ENCOUNTER — Encounter: Payer: Self-pay | Admitting: Internal Medicine

## 2020-10-20 ENCOUNTER — Ambulatory Visit (INDEPENDENT_AMBULATORY_CARE_PROVIDER_SITE_OTHER): Payer: Medicare Other | Admitting: Internal Medicine

## 2020-10-20 ENCOUNTER — Other Ambulatory Visit: Payer: Self-pay

## 2020-10-20 ENCOUNTER — Other Ambulatory Visit
Admission: RE | Admit: 2020-10-20 | Discharge: 2020-10-20 | Disposition: A | Discharge status: Home / Self Care | Payer: Medicare Other | Source: Ambulatory Visit | Attending: Internal Medicine | Admitting: Internal Medicine

## 2020-10-20 VITALS — BP 120/90 | HR 82 | Ht 69.0 in | Wt 302.0 lb

## 2020-10-20 DIAGNOSIS — I471 Supraventricular tachycardia: Secondary | ICD-10-CM | POA: Diagnosis not present

## 2020-10-20 DIAGNOSIS — I491 Atrial premature depolarization: Secondary | ICD-10-CM

## 2020-10-20 DIAGNOSIS — Z79899 Other long term (current) drug therapy: Secondary | ICD-10-CM | POA: Diagnosis present

## 2020-10-20 DIAGNOSIS — I5022 Chronic systolic (congestive) heart failure: Secondary | ICD-10-CM | POA: Diagnosis present

## 2020-10-20 DIAGNOSIS — R002 Palpitations: Secondary | ICD-10-CM | POA: Diagnosis present

## 2020-10-20 DIAGNOSIS — I25118 Atherosclerotic heart disease of native coronary artery with other forms of angina pectoris: Secondary | ICD-10-CM

## 2020-10-20 DIAGNOSIS — I428 Other cardiomyopathies: Secondary | ICD-10-CM | POA: Diagnosis not present

## 2020-10-20 DIAGNOSIS — I440 Atrioventricular block, first degree: Secondary | ICD-10-CM | POA: Diagnosis not present

## 2020-10-20 DIAGNOSIS — I10 Essential (primary) hypertension: Secondary | ICD-10-CM

## 2020-10-20 LAB — BASIC METABOLIC PANEL
Anion gap: 7 (ref 5–15)
BUN: 15 mg/dL (ref 6–20)
CO2: 23 mmol/L (ref 22–32)
Calcium: 9.5 mg/dL (ref 8.9–10.3)
Chloride: 109 mmol/L (ref 98–111)
Creatinine, Ser: 0.87 mg/dL (ref 0.61–1.24)
GFR, Estimated: >60 mL/min (ref >60)
Glucose, Bld: 118 mg/dL — ABNORMAL HIGH (ref 70–99)
Potassium: 3.8 mmol/L (ref 3.5–5.1)
Sodium: 139 mmol/L (ref 135–145)

## 2020-10-20 MED ORDER — SPIRONOLACTONE 50 MG PO TABS: 50.0000 mg | ORAL_TABLET | Freq: Every day | ORAL | 6 refills | Status: DC

## 2020-10-20 NOTE — Patient Instructions (Signed)
Medication Instructions:  - Your physician has recommended you make the following change in your medication:   1) INCREASE spironolactone to 50 mg- take 1 tablet by mouth once daily  *If you need a refill on your cardiac medications before your next appointment, please call your pharmacy*   Lab Work: - Your physician recommends that you have lab work today in the [place]: [place] recommends that you return for lab work in: 1 week- [place] at [facility] 1st desk on the right to check in, past the screening table Lab hours: Monday- Friday (7:30 am- 5:30 pm)   If you have labs (blood work) drawn today and your tests are completely normal, you will receive your results only by: Marland Kitchen MyChart Message (if you have MyChart) OR . A paper copy in the mail If you have any lab test that is abnormal or we need to change your treatment, we will call you to review the results.   Testing/Procedures: - You have been referred to Cardiac Electrophysiology- Dr. Caryl Comes Dr. Quentin Ore: Evaluation of palpitations/ premature atrial contractions/ 1st degree heart block    Follow-Up: At [facility], you and your health needs are our priority.  As part of our continuing mission to provide you with exceptional heart care, we have created designated Provider Care Teams.  These Care Teams include your primary Cardiologist (physician) and Advanced Practice Providers (APPs -  Physician Assistants and Nurse Practitioners) who all work together to provide you with the care you need, when you need it.  We recommend signing up for the patient portal called "MyChart".  Sign up information is provided on this After Visit Summary.  MyChart is used to connect with patients for Virtual Visits (Telemedicine).  Patients are able to view lab/test results, encounter notes, upcoming appointments, etc.  Non-urgent messages can be sent to your provider as well.   To learn more about what you can do  with MyChart, go to [email or web address].    Your next appointment:   1) 1 month   The format for your next appointment:   In Person  Provider:   You may see Nelva Bush, MD or one of the following Advanced Practice Providers on your designated Care Team:    Murray Hodgkins, NP  Christell Faith, PA-C  Marrianne Mood, PA-C  Cadence Fremont, [place]  Laurann Montana, NP    Other Instructions n/a

## 2020-10-20 NOTE — Progress Notes (Signed)
Follow-up Outpatient Visit Date: 10/20/2020  Primary Care Provider: Trinna Post, PA-C No address on file  Chief Complaint: Follow-up cardiomyopathy  HPI:  Aaron [name] is a 55 y.o. male with history of mild, nonobstructive CAD, nonischemic cardiomyopathy (LVEF ~40% by left ventriculogram in 06/2020), HTN, kidney stones, morbid obesity, obstructive sleep apnea, and COVID-19 infection (05/2020), who presents for follow-up of cardiomyopathy and hypertension.  He was last seen in our office by Laurann Montana, NP, in early April, at which time he reported stable dyspnea on exertion.  He was switched from losartan to [facility].  Furosemide was also increased from 40 mg daily to 40 mg BID x 3 days.  Today, Aaron [name] reports that he does not feel well.  He still has exertional dyspnea and tightness in the chest with mild activity.  He also feels like his heart speeds up very quickly whenever he exerts himself.  It usually takes about 15 minutes for his elevated heart rate to subside with rest.  He remains fairly sedentary on account of the symptoms as well as his chronic back pain.  He has not been able to lose much weight.  He was unable to afford Entresto due to cost and remains on losartan.  He has not had any lightheadedness/syncope nor edema.  --------------------------------------------------------------------------------------------------  Past Medical History:  Diagnosis Date  . Chronic back pain    disc  . Coronary artery disease   . HFrEF (heart failure with reduced ejection fraction) ([place])   . Hypertension   . Kidney stone   . MDD (major depressive disorder)    Past Surgical History:  Procedure Laterality Date  . CARDIAC CATHETERIZATION    . KIDNEY STONE SURGERY    . LEFT HEART CATH AND CORONARY ANGIOGRAPHY Left 06/27/2020   Procedure: LEFT HEART CATH AND CORONARY ANGIOGRAPHY;  Surgeon: Nelva Bush, MD;  Location: [facility];  Service: Cardiovascular;   Laterality: Left;  Marland Kitchen VASECTOMY      Current Meds  Medication Sig  . acetaminophen (TYLENOL) 325 MG tablet Take 650-975 mg by mouth every 6 (six) hours as needed for moderate pain or headache (for pain.).  Marland Kitchen albuterol (VENTOLIN HFA) 108 (90 Base) MCG/ACT inhaler Inhale 2 puffs into the lungs every 6 (six) hours as needed for wheezing or shortness of breath.  . celecoxib (CELEBREX) 100 MG capsule Take 1 capsule (100 mg total) by mouth daily as needed.  . cetirizine (ZYRTEC) 10 MG tablet Take 1 tablet (10 mg total) by mouth daily as needed (allergies.).  Marland Kitchen cyclobenzaprine (FLEXERIL) 10 MG tablet Take 10 mg by mouth at bedtime.  . empagliflozin (JARDIANCE) 10 MG TABS tablet Take 1 tablet (10 mg total) by mouth daily before breakfast.  . fluticasone (FLONASE) 50 MCG/ACT nasal spray Place 2 sprays into both nostrils daily as needed (allergies.).  Marland Kitchen furosemide (LASIX) 40 MG tablet TAKE 1 TABLET BY MOUTH EVERY DAY  . gabapentin (NEURONTIN) 600 MG tablet Take 600 mg by mouth 3 (three) times daily.  Marland Kitchen ibuprofen (ADVIL) 200 MG tablet Take 600 mg by mouth every 6 (six) hours as needed for headache.  . ipratropium (ATROVENT HFA) 17 MCG/ACT inhaler Inhale 2 puffs into the lungs every 6 (six) hours.  . isosorbide mononitrate (IMDUR) 30 MG 24 hr tablet Take 15 mg by mouth daily.  Marland Kitchen losartan (COZAAR) 100 MG tablet Take 1 tablet (100 mg total) by mouth daily.  . meloxicam (MOBIC) 15 MG tablet as needed.  . metoprolol succinate (TOPROL-XL)  50 MG 24 hr tablet Take 0.5 tablets (25 mg total) by mouth daily.  . montelukast (SINGULAIR) 10 MG tablet Take 1 tablet (10 mg total) by mouth at bedtime as needed (allergies.).  Marland Kitchen spironolactone (ALDACTONE) 25 MG tablet Take 1 tablet (25 mg total) by mouth daily.    Allergies: Patient has no known allergies.  Social History   Tobacco Use  . Smoking status: Never Smoker  . Smokeless tobacco: Never Used  Vaping Use  . Vaping Use: Never used  Substance Use Topics  .  Alcohol use: Not Currently  . Drug use: Never    Family History  Problem Relation Age of Onset  . COPD Mother   . Cancer Mother   . Hypertension Father   . Congestive Heart Failure Father   . COPD Father   . Lung cancer Father   . Heart attack Father 60  . Hypertension Sister   . Diabetes Paternal Grandfather   . Heart attack Paternal Grandfather   . Healthy Son   . Healthy Daughter     Review of Systems: A 12-system review of systems was performed and was negative except as noted in the HPI.  --------------------------------------------------------------------------------------------------  Physical Exam: BP 120/90 (BP Location: Left Arm, Patient Position: Sitting, Cuff Size: Large)   Pulse 82   Ht 5' 9"  (1.753 m)   Wt (!) 302 lb (137 kg)   SpO2 98%   BMI 44.60 kg/m   General:  NAD.  Accompanied by significant other. Neck: No JVD or HJR, though body habitus limits evaluation. Lungs: Clear to auscultation bilaterally without wheezes or crackles. Heart: Distant heart sounds.  Regular rate and rhythm without murmurs, rubs, or gallops. Abdomen: Soft, nontender, nondistended. Extremities: No lower extremity edema.  EKG: Normal sinus rhythm with first-degree AV block (PR interval 288 ms), left axis deviation, and lateral ST/T changes most likely related to abnormal repolarization.  No significant change from prior tracing on 07/07/2020.  Lab Results  Component Value Date   WBC 11.1 (H) 06/26/2020   HGB 15.5 06/26/2020   HCT 45.9 06/26/2020   MCV 89.0 06/26/2020   PLT 347 06/26/2020    Lab Results  Component Value Date   NA 143 08/03/2020   K 3.7 08/03/2020   CL 105 08/03/2020   CO2 21 08/03/2020   BUN 14 08/03/2020   CREATININE 1.20 08/03/2020   GLUCOSE 122 (H) 08/03/2020   ALT 67 (H) 05/20/2020    Lab Results  Component Value Date   CHOL 96 05/19/2020   HDL 27 (L) 05/19/2020   LDLCALC 55 05/19/2020   TRIG 70 05/19/2020   CHOLHDL 3.6 05/19/2020     --------------------------------------------------------------------------------------------------  ASSESSMENT AND PLAN: Chronic HFrEF due to nonischemic cardiomyopathy: Aaron [name] continues to have significant limitations related to shortness of breath and chest tightness as well as his back pain.  Symptoms are consistent with NYHA class III heart failure.  Volume assessment is very challenging on account of his morbid obesity, though his weight is relatively stable since February and actually down slightly since early April.  I suspect that deconditioning is playing a significant role in his symptoms, given that catheterization earlier this year showed mild, nonobstructive CAD with only mildly elevated left heart filling pressures.  I have encouraged him to try to increase his activity as tolerated.  I also think that moving forward with sleep study (patient had to defer this due to personal reasons) would be useful as untreated sleep apnea may be contributing  to his symptoms.  In the meantime, we will increase spironolactone to 50 mg daily.  I will check a BMP today and again in a week to ensure stable renal function and potassium.  We will need to consider follow-up echo at some point to reassess LVEF, which was moderately reduced on left ventriculogram in 06/2020.  PSVT, first-degree AV block, and palpitations: Aaron [name] reports increasing palpitations, especially when he is active.  This may be appropriate sinus tachycardia in the setting of activity, though monitor last year was notable for frequent PACs and multiple runs of PSVT lasting up to 11 beats.  His EKG today again shows a PR interval approaching 300 ms on the low-dose metoprolol.  I am reluctant to escalate this further (it may ultimately need to be stopped altogether) and will refer Aaron [name] to EP to discuss other treatment options.  We will defer medication changes today.  Hypertension: Diastolic blood pressure borderline  elevated today.  As above, we will increase spironolactone to 50 mg daily with BMP today and again in a week.  No changes to current doses of metoprolol and losartan (patient was unable to afford Entresto).  Morbid obesity: BMI greater than 40 with multiple comorbidities.  Weight loss through diet and exercise again encouraged.  Follow-up: Return to clinic in 1 month.  Nelva Bush, MD 10/20/2020 4:02 PM

## 2020-10-22 ENCOUNTER — Encounter: Payer: Self-pay | Admitting: Internal Medicine

## 2020-10-22 DIAGNOSIS — I471 Supraventricular tachycardia: Secondary | ICD-10-CM | POA: Insufficient documentation

## 2020-10-22 DIAGNOSIS — I4891 Unspecified atrial fibrillation: Secondary | ICD-10-CM | POA: Diagnosis present

## 2020-10-22 DIAGNOSIS — I428 Other cardiomyopathies: Secondary | ICD-10-CM | POA: Insufficient documentation

## 2020-10-27 ENCOUNTER — Other Ambulatory Visit: Payer: Self-pay

## 2020-10-27 ENCOUNTER — Telehealth: Payer: Self-pay | Admitting: Internal Medicine

## 2020-10-27 ENCOUNTER — Other Ambulatory Visit: Payer: Self-pay | Admitting: *Deleted

## 2020-10-27 ENCOUNTER — Other Ambulatory Visit
Admission: RE | Admit: 2020-10-27 | Discharge: 2020-10-27 | Disposition: A | Discharge status: Home / Self Care | Payer: Medicare Other | Attending: Internal Medicine | Admitting: Internal Medicine

## 2020-10-27 DIAGNOSIS — I471 Supraventricular tachycardia: Secondary | ICD-10-CM | POA: Diagnosis present

## 2020-10-27 DIAGNOSIS — I428 Other cardiomyopathies: Secondary | ICD-10-CM

## 2020-10-27 DIAGNOSIS — I5022 Chronic systolic (congestive) heart failure: Secondary | ICD-10-CM | POA: Diagnosis present

## 2020-10-27 DIAGNOSIS — Z79899 Other long term (current) drug therapy: Secondary | ICD-10-CM

## 2020-10-27 DIAGNOSIS — R002 Palpitations: Secondary | ICD-10-CM

## 2020-10-27 LAB — BASIC METABOLIC PANEL
Anion gap: 8 (ref 5–15)
BUN: 13 mg/dL (ref 6–20)
CO2: 24 mmol/L (ref 22–32)
Calcium: 9.1 mg/dL (ref 8.9–10.3)
Chloride: 106 mmol/L (ref 98–111)
Creatinine, Ser: 0.89 mg/dL (ref 0.61–1.24)
GFR, Estimated: >60 mL/min (ref >60)
Glucose, Bld: 114 mg/dL — ABNORMAL HIGH (ref 70–99)
Potassium: 3.9 mmol/L (ref 3.5–5.1)
Sodium: 138 mmol/L (ref 135–145)

## 2020-10-27 NOTE — Telephone Encounter (Signed)
The patient was seen in office with Dr. Saunders Revel on 10/20/20. He brought in a letter from [place] "[place] in [place]."  Per Dr. Saunders Revel, we needed to fax a copy of his office note to [facility] at [phone] per the patient's request.  I have faxed Dr. Darnelle Bos note from the patient's 10/20/20 office visit to the # listed above. Fax confirmation received.

## 2020-10-27 NOTE — Progress Notes (Signed)
Agree with the details of the visit as noted by Tammy Parrett, NP.  C. Laura Alanta Scobey, MD [facility] 

## 2020-11-01 DIAGNOSIS — J45909 Unspecified asthma, uncomplicated: Secondary | ICD-10-CM | POA: Insufficient documentation

## 2020-11-01 DIAGNOSIS — E782 Mixed hyperlipidemia: Secondary | ICD-10-CM | POA: Insufficient documentation

## 2020-11-01 DIAGNOSIS — E1165 Type 2 diabetes mellitus with hyperglycemia: Secondary | ICD-10-CM | POA: Insufficient documentation

## 2020-11-01 DIAGNOSIS — M5440 Lumbago with sciatica, unspecified side: Secondary | ICD-10-CM | POA: Insufficient documentation

## 2020-11-02 ENCOUNTER — Telehealth: Payer: Self-pay | Admitting: Internal Medicine

## 2020-11-02 NOTE — Telephone Encounter (Signed)
Forms from [employer] received on 11/02/20.   Notified Patient via phone that forms received and to come to office to complete ROI and $29 fee.    Forms placed on Jennifer's desk pending signature and payment.

## 2020-11-07 ENCOUNTER — Telehealth: Payer: Self-pay | Admitting: Internal Medicine

## 2020-11-07 DIAGNOSIS — Z0279 Encounter for issue of other medical certificate: Secondary | ICD-10-CM

## 2020-11-07 NOTE — Telephone Encounter (Signed)
Forms placed in Dr. Darnelle Bos office.

## 2020-11-07 NOTE — Telephone Encounter (Signed)
I agree with stopping Jardiance and being evaluated for UTI or balantitis by PCP. Thanks.  Nelva Bush, MD [facility]

## 2020-11-07 NOTE — Telephone Encounter (Signed)
Patient completed ROI packet and paid $29 fee.  Placed forms in nurse box.    Patient notes he was told by provider he would be out of work for a while and employer will require the forms completed every month unless he specifies on the form a longer date .

## 2020-11-07 NOTE — Telephone Encounter (Signed)
Pt c/o medication issue:  1. Name of Medication: jardiance  2. How are you currently taking this medication (dosage and times per day)?    3. Are you having a reaction (difficulty breathing--STAT)? Itching and burning with urination  4. What is your medication issue? Patient held for a few days and symptoms went away and returned when restarted

## 2020-11-07 NOTE — Telephone Encounter (Signed)
Pt has been taking Jardiance 93m daily since 09/08/20.  Reports itching around urethra that is constant and burning with urination.  Pt held [place] a few days, s/s resolved, s/s returned after restarted medication.   Notified pt that Jardiance can cause higher risk of UTI and advised he have UA at PCP to evaluate s/s.  Pt has appt today with PCP and will discuss his s/s.  Advised pt hold Jardiance and will notify Dr. ESaunders Revelfor further recc.   Pt voiced understanding and no further questions.

## 2020-11-08 ENCOUNTER — Institutional Professional Consult (permissible substitution): Payer: Medicare Other | Admitting: Cardiology

## 2020-11-14 NOTE — Progress Notes (Deleted)
Cardiology Office Note    Date:  11/14/2020   ID:  Aaron [name], DOB 1966/03/10, MRN [id number]  PCP:  [facility]  Cardiologist:  Nelva Bush, MD  Electrophysiologist:  None   Chief Complaint: Follow up  History of Present Illness:   Aaron [name] is a 55 y.o. male with history of nonobstructive CAD by [place] in 06/2020, HFrEF secondary to NICM, COVID infection, HTN, restrictive lung disease by PFTs, nephrolithiasis, first-degree AV block, morbid obesity, OSA, chronic back pain, and depression who presents for follow-up of ***  Stress testing in 01/2020 showed a possible area of ischemia involving the inferolateral wall with recommendation to proceed with medical therapy.  Subsequent echo in 01/2020 showed an EF of 55 to 60%, no regional wall motion abnormalities, grade 1 diastolic dysfunction, moderately dilated left atrium, and mild mitral regurgitation.  In follow-up in 01/2020 he noted palpitations with subsequent Zio patch showing a predominant rhythm of sinus with frequent PACs with a burden of 13% and 42 atrial runs with the longest episode lasting 11 beats.  With this metoprolol was titrated.  In follow-up in 03/2019 when he noted dizziness and had a marked first-degree AV block leading metoprolol to be decreased.  LHC was recommended given the above prior abnormal MPI.  However, this was delayed due to him being hospitalized in 05/2020 with COVID requiring treatment with remdesivir and steroids.  He was discharged on supplemental oxygen though was able to be weaned off.  He subsequently underwent LHC on 06/27/2020 which showed mild nonobstructive CAD, moderately reduced LVEF of 40%, and mildly elevated filling pressures.  Medical therapy was recommended.  He was last seen on 10/20/2020 and indicated he did not feel well.  He reported he continued to have exertional dyspnea and tightness in his chest with mild activity.  He also felt like his heart sped up quickly  whenever he exerted himself.  He indicated it would take approximately 15 minutes for his elevated heart rate to subside with rest.  He remained fairly sedentary on the account of the above symptoms as well as with chronic back pain.  He was unable to afford previously recommended Entresto.  His weight was relatively stable.  It was suspected that deconditioning was playing a significant role in his symptoms given his LHC showed mild nonobstructive CAD with only mildly elevated left heart filling pressures.  He was encouraged to increase his activity level.  Sleep study was also recommended.  Spironolactone was titrated to 50 mg.  With regards to his continued palpitations when active, this was possibly felt to be related to appropriate sinus tachycardia in the setting of physical deconditioning.  Given his continued first-degree AV block approaching 300 ms on low-dose metoprolol this was not titrated any further and it was felt it may need to ultimately be stopped altogether.  He was referred to EP to discuss other treatment options.  We received a phone call earlier this month noting he was being evaluated for UTI/balanitis while on Jardiance and in this setting this medication was discontinued.  ***   Labs independently reviewed: 10/2020 - potassium 3.9, BUN 13, serum creatinine 0.89 06/2020 - Hgb 15.5, PLT 347 05/2020 - magnesium 2.6, albumin 2.9, AST normal, ALT 67, TC 96, TG 70, HDL 27, LDL 55, A1c 6.2  Past Medical History:  Diagnosis Date   Chronic back pain    disc   Coronary artery disease    HFrEF (heart failure with reduced ejection fraction) (  [place])    Hypertension    Kidney stone    MDD (major depressive disorder)     Past Surgical History:  Procedure Laterality Date   CARDIAC CATHETERIZATION     KIDNEY STONE SURGERY     LEFT HEART CATH AND CORONARY ANGIOGRAPHY Left 06/27/2020   Procedure: LEFT HEART CATH AND CORONARY ANGIOGRAPHY;  Surgeon: Nelva Bush, MD;  Location: [facility];  Service: Cardiovascular;  Laterality: Left;   VASECTOMY      Current Medications: No outpatient medications have been marked as taking for the 11/20/20 encounter (Appointment) with Rise Mu, PA-C.    Allergies:   Patient has no known allergies.   Social History   Socioeconomic History   Marital status: Widowed    Spouse name: carisa   Number of children: 2   Years of education: Not on file   Highest education level: High school graduate  Occupational History   Not on file  Tobacco Use   Smoking status: Never   Smokeless tobacco: Never  Vaping Use   Vaping Use: Never used  Substance and Sexual Activity   Alcohol use: Not Currently   Drug use: Never   Sexual activity: Not Currently  Other Topics Concern   Not on file  Social History Narrative   Not on file   Social Determinants of Health   Financial Resource Strain: Not on file  Food Insecurity: Not on file  Transportation Needs: Not on file  Physical Activity: Not on file  Stress: Not on file  Social Connections: Not on file     Family History:  The patient's family history includes COPD in his father and mother; Cancer in his mother; Congestive Heart Failure in his father; Diabetes in his paternal grandfather; Healthy in his daughter and son; Heart attack in his paternal grandfather; Heart attack (age of onset: 75) in his father; Hypertension in his father and sister; Lung cancer in his father.  ROS:   ROS   EKGs/Labs/Other Studies Reviewed:    Studies reviewed were summarized above. The additional studies were reviewed today:  LHC 06/27/2020: Conclusions: Mild, non-obstructive coronary artery disease. Moderately reduced left ventricular systolic function (LVEF ~93%) with mildly elevated filling pressure (15-20 mmHg).   Recommendations: Primary prevention of coronary artery disease. Escalate goal-directed medical therapy for non-ischemic cardiomyopathy.  I will add spironolactone 25 mg  daily with BMP within 1 week. Follow-up in office in ~2 weeks. __________  [facility] Reach patch 02/2020: The patient was monitored for 3 days, 20 hours. The predominant rhythm was sinus with an average rate of 74 bpm (range 46 to 148 bpm in sinus). Underlying first-degree AV block was noted. There were frequent PACs (PAC burden ~13%) and rare PVCs. 42 atrial runs were observed, lasting up to 11 beats with a maximum rate of 164 bpm. No sustained arrhythmia or prolonged pause was observed. Patient triggered events correspond to sinus rhythm with PACs and PVCs.   Predominately sinus rhythm with first-degree AV block with frequent PACs and multiple brief episodes of PSVT.  Rare PVCs also noted. __________  Carotid artery ultrasound 01/19/2020: Right Carotid: There was no evidence of thrombus, dissection,  atherosclerotic                 plaque or stenosis in the cervical carotid system.   Left Carotid: There was no evidence of thrombus, dissection,  atherosclerotic                plaque or stenosis  in the cervical carotid system. __________  2D echo 01/19/2020: 1. Left ventricular ejection fraction, by estimation, is 55 to 60%. The  left ventricle has normal function. The left ventricle has no regional  wall motion abnormalities. Left ventricular diastolic parameters are  consistent with Grade I diastolic  dysfunction (impaired relaxation).   2. Right ventricular systolic function is normal. The right ventricular  size is normal.   3. Left atrial size was moderately dilated.   4. Mild mitral valve regurgitation. __________  [facility] MPI 01/04/2020: Abnormal pharmacologic myocardial perfusion stress test. There is a moderate in size, mild in severity, reversible defect involving the inferolateral myocardium that may reflect subtle ischemia but cannot rule out artifact. Left ventricular systolic function is low normal to mildly reduced by visual estimation and Siemens calculation (51%). QGS  calculation of 35% is likely artificially low due to gating parameters. There is no significant coronary artery calcification on the attenuation correction CT. Sensitivity and specificity of this study are degraded by body habitus. This is an intermediate risk study. _________  Nuclear stress test 10/03/2011:   1.      Equivocal ETT based on baseline electrocardiographic changes. 2.      LV function normal. EF of 68%. 3.      No evidence of reversible ischemia noted.   EKG:  EKG is ordered today.  The EKG ordered today demonstrates ***  Recent Labs: 05/19/2020: B Natriuretic Peptide 181.7 05/20/2020: ALT 67; Magnesium 2.6 06/26/2020: Hemoglobin 15.5; Platelets 347 10/27/2020: BUN 13; Creatinine, Ser 0.89; Potassium 3.9; Sodium 138  Recent Lipid Panel    Component Value Date/Time   CHOL 96 05/19/2020 0500   CHOL 117 11/17/2019 1003   TRIG 70 05/19/2020 0500   HDL 27 (L) 05/19/2020 0500   HDL 31 (L) 11/17/2019 1003   CHOLHDL 3.6 05/19/2020 0500   VLDL 14 05/19/2020 0500   LDLCALC 55 05/19/2020 0500   LDLCALC 64 11/17/2019 1003    PHYSICAL EXAM:    VS:  There were no vitals taken for this visit.  BMI: There is no height or weight on file to calculate BMI.  Physical Exam  Wt Readings from Last 3 Encounters:  10/20/20 (!) 302 lb (137 kg)  10/17/20 (!) 302 lb (137 kg)  09/08/20 (!) 308 lb (139.7 kg)     ASSESSMENT & PLAN:   HFrEF secondary to NICM:  Paroxysmal SVT with underlying first-degree AV block and palpitations:  HTN: Blood pressure ***  Morbid obesity with OSA:  SOB:  Disposition: F/u with Dr. Saunders Revel or an APP in ***.   Medication Adjustments/Labs and Tests Ordered: Current medicines are reviewed at length with the patient today.  Concerns regarding medicines are outlined above. Medication changes, Labs and Tests ordered today are summarized above and listed in the Patient Instructions accessible in Encounters.   Signed, Christell Faith, PA-C 11/14/2020 3:51 PM      [facility] [address] [place] [address] [place], [place] [address] [phone]

## 2020-11-15 NOTE — Telephone Encounter (Signed)
Patient calling stating that Aaron [name] did not receive last office note and any other needed documentation  Please advise

## 2020-11-16 NOTE — Telephone Encounter (Signed)
Previously faxed to number provided and contacted patient on 6/8 to make him aware.     Re faxed today with last ov note .

## 2020-11-20 ENCOUNTER — Ambulatory Visit: Payer: Medicare Other | Admitting: Physician Assistant

## 2020-11-20 ENCOUNTER — Other Ambulatory Visit: Payer: Self-pay

## 2020-11-20 ENCOUNTER — Ambulatory Visit (INDEPENDENT_AMBULATORY_CARE_PROVIDER_SITE_OTHER): Payer: Medicare Other | Admitting: Physician Assistant

## 2020-11-20 ENCOUNTER — Encounter: Payer: Self-pay | Admitting: Physician Assistant

## 2020-11-20 VITALS — BP 100/80 | HR 73 | Ht 69.0 in | Wt 296.0 lb

## 2020-11-20 DIAGNOSIS — I428 Other cardiomyopathies: Secondary | ICD-10-CM | POA: Diagnosis not present

## 2020-11-20 DIAGNOSIS — R0609 Other forms of dyspnea: Secondary | ICD-10-CM

## 2020-11-20 DIAGNOSIS — I471 Supraventricular tachycardia: Secondary | ICD-10-CM

## 2020-11-20 DIAGNOSIS — I10 Essential (primary) hypertension: Secondary | ICD-10-CM

## 2020-11-20 DIAGNOSIS — I5022 Chronic systolic (congestive) heart failure: Secondary | ICD-10-CM

## 2020-11-20 DIAGNOSIS — I2510 Atherosclerotic heart disease of native coronary artery without angina pectoris: Secondary | ICD-10-CM

## 2020-11-20 DIAGNOSIS — Z8616 Personal history of COVID-19: Secondary | ICD-10-CM

## 2020-11-20 DIAGNOSIS — G4733 Obstructive sleep apnea (adult) (pediatric): Secondary | ICD-10-CM

## 2020-11-20 DIAGNOSIS — I440 Atrioventricular block, first degree: Secondary | ICD-10-CM | POA: Diagnosis not present

## 2020-11-20 DIAGNOSIS — R0600 Dyspnea, unspecified: Secondary | ICD-10-CM

## 2020-11-20 DIAGNOSIS — I25118 Atherosclerotic heart disease of native coronary artery with other forms of angina pectoris: Secondary | ICD-10-CM

## 2020-11-20 NOTE — Patient Instructions (Addendum)
Medication Instructions:   Your physician recommends that you continue on your current medications as directed. Please refer to the Current Medication list given to you today.  *If you need a refill on your cardiac medications before your next appointment, please call your pharmacy*   Lab Work:  Labs to be done 11/22/20 at our office at 2:45PM: BMET  These labs are NOT fasting  Testing/Procedures:  Your physician has requested that you have an echocardiogram. Echocardiography is a painless test that uses sound waves to create images of your heart. It provides your doctor with information about the size and shape of your heart and how well your heart's chambers and valves are working. This procedure takes approximately one hour. There are no restrictions for this procedure.   Follow-Up: At [facility], you and your health needs are our priority.  As part of our continuing mission to provide you with exceptional heart care, we have created designated Provider Care Teams.  These Care Teams include your primary Cardiologist (physician) and Advanced Practice Providers (APPs -  Physician Assistants and Nurse Practitioners) who all work together to provide you with the care you need, when you need it.  We recommend signing up for the patient portal called "MyChart".  Sign up information is provided on this After Visit Summary.  MyChart is used to connect with patients for Virtual Visits (Telemedicine).  Patients are able to view lab/test results, encounter notes, upcoming appointments, etc.  Non-urgent messages can be sent to your provider as well.   To learn more about what you can do with MyChart, go to [email or web address].    Your next appointment:   2 - 3  month(s)  The format for your next appointment:   In Person  Provider:   You may see Nelva Bush, MD or one of the following Advanced Practice Providers on your designated Care Team:   Murray Hodgkins, NP Christell Faith,  PA-C Marrianne Mood, PA-C Cadence [place], [place] Laurann Montana, NP
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
## 2020-11-21 ENCOUNTER — Other Ambulatory Visit: Payer: Medicare Other

## 2020-11-22 ENCOUNTER — Ambulatory Visit (INDEPENDENT_AMBULATORY_CARE_PROVIDER_SITE_OTHER): Payer: Medicare Other | Admitting: Cardiology

## 2020-11-22 ENCOUNTER — Encounter: Payer: Self-pay | Admitting: Cardiology

## 2020-11-22 ENCOUNTER — Other Ambulatory Visit (INDEPENDENT_AMBULATORY_CARE_PROVIDER_SITE_OTHER): Payer: Medicare Other

## 2020-11-22 ENCOUNTER — Other Ambulatory Visit: Payer: Self-pay

## 2020-11-22 VITALS — BP 110/80 | HR 88 | Ht 69.0 in | Wt 299.0 lb

## 2020-11-22 DIAGNOSIS — I5022 Chronic systolic (congestive) heart failure: Secondary | ICD-10-CM | POA: Diagnosis not present

## 2020-11-22 DIAGNOSIS — I491 Atrial premature depolarization: Secondary | ICD-10-CM | POA: Diagnosis not present

## 2020-11-22 DIAGNOSIS — I440 Atrioventricular block, first degree: Secondary | ICD-10-CM

## 2020-11-22 DIAGNOSIS — I428 Other cardiomyopathies: Secondary | ICD-10-CM

## 2020-11-22 DIAGNOSIS — R0600 Dyspnea, unspecified: Secondary | ICD-10-CM | POA: Diagnosis not present

## 2020-11-22 DIAGNOSIS — I10 Essential (primary) hypertension: Secondary | ICD-10-CM

## 2020-11-22 DIAGNOSIS — R0609 Other forms of dyspnea: Secondary | ICD-10-CM

## 2020-11-22 MED ORDER — METOPROLOL SUCCINATE ER 50 MG PO TB24: 50.0000 mg | ORAL_TABLET | Freq: Every day | ORAL | 1 refills | Status: DC

## 2020-11-22 NOTE — Progress Notes (Signed)
Electrophysiology Office Note:    Date:  11/22/2020   ID:  Aaron Aaron [name], DOB December 19, 1965, MRN [id number]  PCP:  [place] Cardiologist:  Aaron Bush, MD  [facility] Electrophysiologist:  None   Referring MD: Aaron Bush, MD   Chief Complaint: PACs  History of Present Illness:    Aaron Aaron [name] is a 55 y.o. male who presents for an evaluation of PACs at the request of Dr. Saunders [name]. Their medical history includes morbid obesity, chronic systolic and diastolic heart failure, hypertension.  The patient LAST saw Aaron Quarry, PA-C on November 20, 2020.  The patient is maintained on metoprolol succinate 25 mg by mouth once daily for his frequent PACs and palpitations.  There has been hesitation to further uptitrate his medication given a prolonged PR interval.  For his heart failure he is maintained on spironolactone, metoprolol, losartan, Lasix.  He has history of sleep apnea and previously used CPAP but stopped wearing a mask years ago.  His wife tells me that he is a "terrible snorer".  She is unsure whether or not he has apneic spells at night.  He is not currently using CPAP.  He drinks at least 64 ounces of regular Pepsi every day.  He tells me when his palpitations start he feels his heart start beating erratically and fast.  Symptoms could last seconds to hours.  He feels poorly when they are happening.  No syncope or presyncope.  Past Medical History:  Diagnosis Date   Chronic back pain    disc   Coronary artery disease    HFrEF (heart failure with reduced ejection fraction) (HCC)    Hypertension    Kidney stone    MDD (major depressive disorder)     Past Surgical History:  Procedure Laterality Date   CARDIAC CATHETERIZATION     KIDNEY STONE SURGERY     LEFT HEART CATH AND CORONARY ANGIOGRAPHY Left 06/27/2020   Procedure: LEFT HEART CATH AND CORONARY ANGIOGRAPHY;  Surgeon: Aaron Bush, MD;  Location: [place] [facility];  Service: Cardiovascular;  Laterality: Left;   VASECTOMY      Current Medications: Current Meds  Medication Sig   acetaminophen (TYLENOL) 325 MG tablet Take 650-975 mg by mouth every 6 (six) hours as needed for moderate pain or headache (for pain.).   albuterol (VENTOLIN HFA) 108 (90 Base) MCG/ACT inhaler Inhale 2 puffs into the lungs every 6 (six) hours as needed for wheezing or shortness of breath.   cetirizine (ZYRTEC) 10 MG tablet Take 1 tablet (10 mg total) by mouth daily as needed (allergies.).   fluticasone (FLONASE) 50 MCG/ACT nasal spray Place 2 sprays into Aaron [name] nostrils daily as needed (allergies.).   furosemide (LASIX) 40 MG tablet TAKE 1 TABLET BY MOUTH EVERY DAY   ibuprofen (ADVIL) 200 MG tablet Take 600 mg by mouth every 6 (six) hours as needed for headache.   ipratropium (ATROVENT HFA) 17 MCG/ACT inhaler Inhale 2 puffs into the lungs every 6 (six) hours.   losartan (COZAAR) 100 MG tablet Take 1 tablet (100 mg total) by mouth daily.   metoprolol succinate (TOPROL-XL) 50 MG 24 hr tablet Take 1 tablet (50 mg total) by mouth daily. Take with or immediately following a meal.   montelukast (SINGULAIR) 10 MG tablet Take 1 tablet (10 mg total) by mouth at bedtime as needed (allergies.).   pregabalin (LYRICA) 100 MG capsule Take 100 mg by mouth at bedtime.   spironolactone (ALDACTONE) 50 MG  tablet Take 1 tablet (50 mg total) by mouth daily.   traMADol HCl 100 MG TABS Take 1 tablet by mouth 3 (three) times daily as needed.   Vitamin D, Ergocalciferol, (DRISDOL) 1.25 MG (50000 UNIT) CAPS capsule Take 1 capsule by mouth once a week.   [DISCONTINUED] metoprolol succinate (TOPROL-XL) 50 MG 24 hr tablet Take 0.5 tablets (25 mg total) by mouth daily.     Allergies:   Patient has no known allergies.   Social History   Socioeconomic History   Marital status: Widowed    Spouse name: Aaron [name]   Number of children: 2   Years of education: Not on file   Highest education level: High  school graduate  Occupational History   Not on file  Tobacco Use   Smoking status: Never   Smokeless tobacco: Never  Vaping Use   Vaping Use: Never used  Substance and Sexual Activity   Alcohol use: Not Currently   Drug use: Never   Sexual activity: Not Currently  Other Topics Concern   Not on file  Social History Narrative   Not on file   Social Determinants of Health   Financial Resource Strain: Not on file  Food Insecurity: Not on file  Transportation Needs: Not on file  Physical Activity: Not on file  Stress: Not on file  Social Connections: Not on file     Family History: The patient's family history includes COPD in his father and mother; Cancer in his mother; Congestive Heart Failure in his father; Diabetes in his paternal grandfather; Healthy in his daughter and son; Heart attack in his paternal grandfather; Heart attack (age of onset: 2) in his father; Hypertension in his father and sister; Lung cancer in his father.  ROS:   Please see the history of present illness.    All other systems reviewed and are negative.  EKGs/Labs/Other Studies Reviewed:    The following studies were reviewed today:  November 20, 2020 EKG personally reviewed First-degree AV delay, 280 ms Sinus rhythm    June 27, 2020 left heart catheterization Conclusions: Mild, non-obstructive coronary artery disease. Moderately reduced left ventricular systolic function (LVEF ~14%) with mildly elevated filling pressure (15-20 mmHg).  January 19, 2020 echo Left ventricular function normal, 55% Right ventricular function normal Moderately dilated left atrium Mild MR   March 06, 2020 4-day ZIO First-degree AV block Frequent PACs, burden 13% Rare PVCs No sustained arrhythmias   Recent Labs: 05/19/2020: B Natriuretic Peptide 181.7 05/20/2020: ALT 67; Magnesium 2.6 06/26/2020: Hemoglobin 15.5; Platelets 347 10/27/2020: BUN 13; Creatinine, Ser 0.89; Potassium 3.9; Sodium 138  Recent  Lipid Panel    Component Value Date/Time   CHOL 96 05/19/2020 0500   CHOL 117 11/17/2019 1003   TRIG 70 05/19/2020 0500   HDL 27 (L) 05/19/2020 0500   HDL 31 (L) 11/17/2019 1003   CHOLHDL 3.6 05/19/2020 0500   VLDL 14 05/19/2020 0500   LDLCALC 55 05/19/2020 0500   LDLCALC 64 11/17/2019 1003    Physical Exam:    VS:  BP 110/80 (BP Location: Left Arm, Patient Position: Sitting, Cuff Size: Normal)   Pulse 88   Ht 5' 9"  (1.753 m)   Wt 299 lb (135.6 kg)   SpO2 97%   BMI 44.15 kg/m     Wt Readings from Last 3 Encounters:  11/22/20 299 lb (135.6 kg)  11/20/20 296 lb (134.3 kg)  10/20/20 (!) 302 lb (137 kg)     GEN:  Well nourished, well developed  in no acute distress.  Morbidly obese HEENT: Normal NECK: No JVD; No carotid bruits LYMPHATICS: No lymphadenopathy CARDIAC: RRR, no murmurs, rubs, gallops RESPIRATORY:  Clear to auscultation without rales, wheezing or rhonchi  ABDOMEN: Soft, non-tender, non-distended MUSCULOSKELETAL:  No edema; No deformity  SKIN: Warm and dry NEUROLOGIC:  Alert and oriented x 3 PSYCHIATRIC:  Normal affect   ASSESSMENT:    1. Chronic HFrEF (heart failure with reduced ejection fraction) ([place])   2. DOE (dyspnea on exertion)   3. First degree heart block   4. PAC (premature atrial contraction)   5. Morbid obesity ([place])    PLAN:    In order of problems listed above:  1. Chronic HFrEF (heart failure with reduced ejection fraction) (HCC) NYHA class II.  Warm and relatively euvolemic today. Recommend he continue Lasix, losartan, metoprolol, spironolactone. Uptitrate metoprolol, see #4  2. DOE (dyspnea on exertion) Relatively euvolemic today.  I suspect part of this is related to his obesity.  We will need to keep a close eye on his arrhythmia to make sure it does not progress to atrial fibrillation.  3. First degree heart block Recent ECG shows a PR interval of 280 ms.  For now, we will try to avoid class Ic agents because of this.  I do  think it is safe for him to increase his metoprolol to succinate to 50 mg by mouth once daily to see if we can help suppress some of his atrial ectopy.  4. PAC (premature atrial contraction) Frequent PACs on recent heart monitor.  I do not see any evidence of atrial fibrillation although we did discuss at length during today's visit the possibility that these PACs other early signs of atrial fibrillation.  I have asked him to keep his appointment for sleep study next month.  He should aggressively try to lose weight in an effort to minimize the chances of worsening heart rhythms and heart failure.  We discussed strategies for weight loss during today's visit.  The first thing he should do a stop drinking sugar sweetened beverages.  5.  Obesity Weight loss encouraged.  He should follow-up with Aaron Quarry, PA-C as scheduled on about 2 months.  I will plan to see him back in 4 to 5 months with an echocardiogram prior to that visit to reassess his left ventricular function.    Total time spent with patient today 50 minutes. This includes reviewing records, evaluating the patient and coordinating care.  Medication Adjustments/Labs and Tests Ordered: Current medicines are reviewed at length with the patient today.  Concerns regarding medicines are outlined above.  Orders Placed This Encounter  Procedures   ECHOCARDIOGRAM COMPLETE   Meds ordered this encounter  Medications   metoprolol succinate (TOPROL-XL) 50 MG 24 hr tablet    Sig: Take 1 tablet (50 mg total) by mouth daily. Take with or immediately following a meal.    Dispense:  90 tablet    Refill:  1    Dose increase     Signed, Lysbeth Galas T. Quentin Ore, MD, [facility], [facility] 11/22/2020 8:43 PM    Electrophysiology [facility]

## 2020-11-22 NOTE — Patient Instructions (Addendum)
Medication Instructions:  Your physician has recommended you make the following change in your medication: INCREASE Toprol to 50 mg once daily  *If you need a refill on your cardiac medications before your next appointment, please call your pharmacy*   Lab Work: None ordered   Testing/Procedures: Your physician has requested that you have an echocardiogram in 4 months, prior to your follow up with Dr. Quentin Ore. Echocardiography is a painless test that uses sound waves to create images of your heart. It provides your doctor with information about the size and shape of your heart and how well your heart's chambers and valves are working. This procedure takes approximately one hour. There are no restrictions for this procedure.   Follow-Up: At [facility], you and your health needs are our priority.  As part of our continuing mission to provide you with exceptional heart care, we have created designated Provider Care Teams.  These Care Teams include your primary Cardiologist (physician) and Advanced Practice Providers (APPs -  Physician Assistants and Nurse Practitioners) who all work together to provide you with the care you need, when you need it.  Your next appointment:   4 - 5  month(s) (after you echocardiogram has been completed)  The format for your next appointment:   In Person  Provider:   Lars Mage, MD    Thank you for choosing [facility]!!    Other Instructions

## 2020-11-23 LAB — BASIC METABOLIC PANEL
BUN/Creatinine Ratio: 12 (ref 9–20)
BUN: 14 mg/dL (ref 6–24)
CO2: 20 mmol/L (ref 20–29)
Calcium: 9.5 mg/dL (ref 8.7–10.2)
Chloride: 103 mmol/L (ref 96–106)
Creatinine, Ser: 1.15 mg/dL (ref 0.76–1.27)
Glucose: 114 mg/dL — ABNORMAL HIGH (ref 65–99)
Potassium: 4 mmol/L (ref 3.5–5.2)
Sodium: 141 mmol/L (ref 134–144)
eGFR: 76 mL/min/{1.73_m2} (ref >59)

## 2020-12-01 ENCOUNTER — Other Ambulatory Visit: Payer: Self-pay | Admitting: Internal Medicine

## 2020-12-05 ENCOUNTER — Telehealth: Payer: Self-pay

## 2020-12-05 ENCOUNTER — Ambulatory Visit: Payer: Medicare Other | Admitting: Medical

## 2020-12-05 NOTE — Telephone Encounter (Signed)
[facility] faxed refill request for the following medications:  cetirizine (ZYRTEC) 10 MG tablet   Please advise.

## 2020-12-05 NOTE — Telephone Encounter (Signed)
Patient reports he is now a patient at [facility]. He is aware to call [facility] and have them fax request to [facility].

## 2020-12-05 NOTE — Telephone Encounter (Signed)
Lmtcb, patient's PCP is now listed as [facility], [facility].

## 2020-12-06 ENCOUNTER — Other Ambulatory Visit: Payer: Self-pay

## 2020-12-06 ENCOUNTER — Emergency Department: Payer: Medicare Other

## 2020-12-06 ENCOUNTER — Emergency Department
Admission: EM | Admit: 2020-12-06 | Discharge: 2020-12-06 | Disposition: A | Discharge status: Home / Self Care | Payer: Medicare Other | Attending: Emergency Medicine | Admitting: Emergency Medicine

## 2020-12-06 ENCOUNTER — Encounter: Payer: Self-pay | Admitting: *Deleted

## 2020-12-06 DIAGNOSIS — I2510 Atherosclerotic heart disease of native coronary artery without angina pectoris: Secondary | ICD-10-CM | POA: Diagnosis not present

## 2020-12-06 DIAGNOSIS — Z79899 Other long term (current) drug therapy: Secondary | ICD-10-CM | POA: Insufficient documentation

## 2020-12-06 DIAGNOSIS — I110 Hypertensive heart disease with heart failure: Secondary | ICD-10-CM | POA: Insufficient documentation

## 2020-12-06 DIAGNOSIS — R110 Nausea: Secondary | ICD-10-CM | POA: Diagnosis not present

## 2020-12-06 DIAGNOSIS — R000 Tachycardia, unspecified: Secondary | ICD-10-CM | POA: Diagnosis not present

## 2020-12-06 DIAGNOSIS — I509 Heart failure, unspecified: Secondary | ICD-10-CM | POA: Diagnosis not present

## 2020-12-06 DIAGNOSIS — R002 Palpitations: Secondary | ICD-10-CM | POA: Insufficient documentation

## 2020-12-06 DIAGNOSIS — R61 Generalized hyperhidrosis: Secondary | ICD-10-CM | POA: Insufficient documentation

## 2020-12-06 DIAGNOSIS — Z8616 Personal history of COVID-19: Secondary | ICD-10-CM | POA: Diagnosis not present

## 2020-12-06 DIAGNOSIS — R0789 Other chest pain: Secondary | ICD-10-CM

## 2020-12-06 LAB — CBC
HCT: 47.5 % (ref 39.0–52.0)
Hemoglobin: 16.3 g/dL (ref 13.0–17.0)
MCH: 30.4 pg (ref 26.0–34.0)
MCHC: 34.3 g/dL (ref 30.0–36.0)
MCV: 88.5 fL (ref 80.0–100.0)
Platelets: 345 10*3/uL (ref 150–400)
RBC: 5.37 MIL/uL (ref 4.22–5.81)
RDW: 12 % (ref 11.5–15.5)
WBC: 12.6 10*3/uL — ABNORMAL HIGH (ref 4.0–10.5)
nRBC: 0 % (ref 0.0–0.2)

## 2020-12-06 LAB — BASIC METABOLIC PANEL
Anion gap: 9 (ref 5–15)
BUN: 15 mg/dL (ref 6–20)
CO2: 20 mmol/L — ABNORMAL LOW (ref 22–32)
Calcium: 9.4 mg/dL (ref 8.9–10.3)
Chloride: 107 mmol/L (ref 98–111)
Creatinine, Ser: 0.84 mg/dL (ref 0.61–1.24)
GFR, Estimated: >60 mL/min (ref >60)
Glucose, Bld: 155 mg/dL — ABNORMAL HIGH (ref 70–99)
Potassium: 3.3 mmol/L — ABNORMAL LOW (ref 3.5–5.1)
Sodium: 136 mmol/L (ref 135–145)

## 2020-12-06 LAB — LIPASE, BLOOD: Lipase: 33 U/L (ref 11–51)

## 2020-12-06 LAB — TSH: TSH: 1.097 u[IU]/mL (ref 0.350–4.500)

## 2020-12-06 LAB — HEPATIC FUNCTION PANEL
ALT: 37 U/L (ref 0–44)
AST: 31 U/L (ref 15–41)
Albumin: 3.9 g/dL (ref 3.5–5.0)
Alkaline Phosphatase: 87 U/L (ref 38–126)
Bilirubin, Direct: <0.1 mg/dL (ref 0.0–0.2)
Total Bilirubin: 0.5 mg/dL (ref 0.3–1.2)
Total Protein: 7.8 g/dL (ref 6.5–8.1)

## 2020-12-06 LAB — TROPONIN I (HIGH SENSITIVITY)
Troponin I (High Sensitivity): 12 ng/L (ref <18)
Troponin I (High Sensitivity): 11 ng/L (ref <18)

## 2020-12-06 LAB — MAGNESIUM: Magnesium: 1.9 mg/dL (ref 1.7–2.4)

## 2020-12-06 MED ORDER — LIDOCAINE VISCOUS HCL 2 % MT SOLN
15.0000 mL | Freq: Once | OROMUCOSAL | Status: AC
  Administered 2020-12-06: 15 mL via ORAL
  Filled 2020-12-06: qty 15

## 2020-12-06 MED ORDER — ALUM & MAG HYDROXIDE-SIMETH 200-200-20 MG/5ML PO SUSP
30.0000 mL | Freq: Once | ORAL | Status: AC
  Administered 2020-12-06: 30 mL via ORAL
  Filled 2020-12-06: qty 30

## 2020-12-06 MED ORDER — PANTOPRAZOLE SODIUM 40 MG PO TBEC
40.0000 mg | DELAYED_RELEASE_TABLET | Freq: Once | ORAL | Status: AC
  Administered 2020-12-06: 40 mg via ORAL
  Filled 2020-12-06: qty 1

## 2020-12-06 MED ORDER — POTASSIUM CHLORIDE CRYS ER 20 MEQ PO TBCR
40.0000 meq | EXTENDED_RELEASE_TABLET | Freq: Once | ORAL | Status: AC
  Administered 2020-12-06: 40 meq via ORAL
  Filled 2020-12-06: qty 2

## 2020-12-06 NOTE — ED Provider Notes (Signed)
[facility] Emergency Department Provider Note  ____________________________________________   Event Date/Time   First MD Initiated Contact with Patient 12/06/20 [id number]     (approximate)  I have reviewed the triage vital signs and the nursing notes.   HISTORY  Chief Complaint Palpitations    HPI Aaron [name] is a 55 y.o. male with history of hypertension, obesity, CHF, CAD, chronic back pain who presents to the emergency department with complaints of palpitations.  States that he started feeling his heart was racing when he got out of a hot bath tonight.  States he thinks that he started to panic which made his symptoms worse.  He had nausea and diaphoresis.  No shortness of breath.  Symptoms lasted about 30 minutes at home and 30 minutes here in the waiting room and have now resolved.  States he is having some indigestion and is asking for Tums.  He denies pressure, tightness.  No fever, cough.  No history of MI.  No history of PE, DVT, exogenous estrogen use, recent fractures, surgery, trauma, hospitalization, prolonged travel or other immobilization. No lower extremity swelling or pain. No calf tenderness.        Past Medical History:  Diagnosis Date   Chronic back pain    disc   Coronary artery disease    HFrEF (heart failure with reduced ejection fraction) (HCC)    Hypertension    Kidney stone    MDD (major depressive disorder)     Patient Active Problem List   Diagnosis Date Noted   Nonischemic cardiomyopathy ([place]) 10/22/2020   PSVT (paroxysmal supraventricular tachycardia) ([place]) 10/22/2020   Cough 07/07/2020   Chronic HFrEF (heart failure with reduced ejection fraction) ([place]) 07/07/2020   CAD in native artery 07/07/2020   Obstructive sleep apnea 07/07/2020   Abnormal stress test 06/27/2020   Hypokalemia 05/18/2020   Elevated troponin 05/18/2020   Pneumonia due to COVID-19 virus 05/11/2020   Unspecified atrial fibrillation ([place])  05/11/2020   Acute respiratory failure due to COVID-19 ([place]) 05/11/2020   Palpitations 03/31/2020   Abnormal EKG 03/31/2020   Shortness of breath 03/31/2020   Chest pain 12/17/2019   Amaurosis fugax 12/17/2019   MDD (major depressive disorder), single episode, moderate ([place]) 11/19/2018   Bereavement 11/19/2018   GAD (generalized anxiety disorder) 11/19/2018   Insomnia due to mental condition 11/19/2018   ADD (attention deficit disorder) 09/02/2018   Essential hypertension 09/02/2018   Kidney stones 09/02/2018   Murmur 09/02/2018   Morbid obesity ([place]) 05/21/2018   Morbid obesity with BMI of 45.0-49.9, adult ([place]) 09/20/2015    Past Surgical History:  Procedure Laterality Date   CARDIAC CATHETERIZATION     KIDNEY STONE SURGERY     LEFT HEART CATH AND CORONARY ANGIOGRAPHY Left 06/27/2020   Procedure: LEFT HEART CATH AND CORONARY ANGIOGRAPHY;  Surgeon: Nelva Bush, MD;  Location: [place] [facility];  Service: Cardiovascular;  Laterality: Left;   VASECTOMY      Prior to Admission medications   Medication Sig Start Date End Date Taking? Authorizing Provider  acetaminophen (TYLENOL) 325 MG tablet Take 650-975 mg by mouth every 6 (six) hours as needed for moderate pain or headache (for pain.).    [provider]  albuterol (VENTOLIN HFA) 108 (90 Base) MCG/ACT inhaler Inhale 2 puffs into the lungs every 6 (six) hours as needed for wheezing or shortness of breath. 09/05/20   Parrett, Fonnie Mu, NP  cetirizine (ZYRTEC) 10 MG tablet Take 1 tablet (10 mg  total) by mouth daily as needed (allergies.). 05/12/20   Enzo Bi, MD  fluticasone (FLONASE) 50 MCG/ACT nasal spray Place 2 sprays into both nostrils daily as needed (allergies.). 05/12/20   Enzo Bi, MD  furosemide (LASIX) 40 MG tablet TAKE 1 TABLET BY MOUTH EVERY DAY 10/02/20   End, Harrell Gave, MD  ibuprofen (ADVIL) 200 MG tablet Take 600 mg by mouth every 6 (six) hours as needed for headache.    [provider]   ipratropium (ATROVENT HFA) 17 MCG/ACT inhaler Inhale 2 puffs into the lungs every 6 (six) hours.    [provider]  losartan (COZAAR) 100 MG tablet Take 1 tablet (100 mg total) by mouth daily. 09/12/20 09/07/21  Loel Dubonnet, NP  metoprolol succinate (TOPROL-XL) 50 MG 24 hr tablet Take 1 tablet (50 mg total) by mouth daily. Take with or immediately following a meal. 11/22/20 02/20/21  Vickie Epley, MD  montelukast (SINGULAIR) 10 MG tablet Take 1 tablet (10 mg total) by mouth at bedtime as needed (allergies.). 05/12/20   Enzo Bi, MD  pregabalin (LYRICA) 100 MG capsule Take 100 mg by mouth at bedtime. 11/15/20   [provider]  spironolactone (ALDACTONE) 50 MG tablet Take 1 tablet (50 mg total) by mouth daily. 10/20/20   End, Harrell Gave, MD  traMADol HCl 100 MG TABS Take 1 tablet by mouth 3 (three) times daily as needed. 11/15/20   [provider]  Vitamin D, Ergocalciferol, (DRISDOL) 1.25 MG (50000 UNIT) CAPS capsule Take 1 capsule by mouth once a week. 11/07/20   [provider]    Allergies Patient has no known allergies.  Family History  Problem Relation Age of Onset   COPD Mother    Cancer Mother    Hypertension Father    Congestive Heart Failure Father    COPD Father    Lung cancer Father    Heart attack Father 69   Hypertension Sister    Diabetes Paternal Grandfather    Heart attack Paternal Grandfather    Healthy Son    Healthy Daughter     Social History Social History   Tobacco Use   Smoking status: Never   Smokeless tobacco: Never  Vaping Use   Vaping Use: Never used  Substance Use Topics   Alcohol use: Not Currently   Drug use: Never    Review of Systems Constitutional: No fever. Eyes: No visual changes. ENT: No sore throat. Cardiovascular: + chest pain. Respiratory: Denies shortness of breath. Gastrointestinal: No vomiting, diarrhea. Genitourinary: Negative for dysuria. Musculoskeletal: + for back pain. Skin:  Negative for rash. Neurological: Negative for focal weakness or numbness.  ____________________________________________   PHYSICAL EXAM:  VITAL SIGNS: ED Triage Vitals  Enc Vitals Group     BP 12/06/20 0051 (!) 143/109     Pulse Rate 12/06/20 0049 (!) 115     Resp 12/06/20 0049 20     Temp 12/06/20 0049 98.3 F (36.8 C)     Temp Source 12/06/20 0049 Oral     SpO2 12/06/20 0049 98 %     Weight --      Height --      Head Circumference --      Peak Flow --      Pain Score 12/06/20 0050 5     Pain Loc --      Pain Edu? --      Excl. in [place]? --    CONSTITUTIONAL: Alert and oriented and responds appropriately to questions. Well-appearing;  well-nourished, obese, in no distress HEAD: Normocephalic EYES: Conjunctivae clear, pupils appear equal, EOM appear intact ENT: normal nose; moist mucous membranes NECK: Supple, normal ROM CARD: RRR; S1 and S2 appreciated; no murmurs, no clicks, no rubs, no gallops RESP: Normal chest excursion without splinting or tachypnea; breath sounds clear and equal bilaterally; no wheezes, no rhonchi, no rales, no hypoxia or respiratory distress, speaking full sentences ABD/GI: Normal bowel sounds; non-distended; soft, non-tender, no rebound, no guarding, no peritoneal signs, no hepatosplenomegaly BACK: The back appears normal EXT: Normal ROM in all joints; no deformity noted, no edema; no cyanosis, no calf tenderness or calf swelling SKIN: Normal color for age and race; warm; no rash on exposed skin NEURO: Moves all extremities equally PSYCH: The patient's mood and manner are appropriate.  ____________________________________________   LABS (all labs ordered are listed, but only abnormal results are displayed)  Labs Reviewed  BASIC METABOLIC PANEL - Abnormal; Notable for the following components:      Result Value   Potassium 3.3 (*)    CO2 20 (*)    Glucose, Bld 155 (*)    All other components within normal limits  CBC - Abnormal; Notable for  the following components:   WBC 12.6 (*)    All other components within normal limits  LIPASE, BLOOD  HEPATIC FUNCTION PANEL  MAGNESIUM  TSH  TROPONIN I (HIGH SENSITIVITY)  TROPONIN I (HIGH SENSITIVITY)   ____________________________________________  EKG   EKG Interpretation  Date/Time:  Wednesday December 06 2020 00:54:04 EDT Ventricular Rate:  112 PR Interval:  176 QRS Duration: 96 QT Interval:  368 QTC Calculation: 502 R Axis:   -28 Text Interpretation: Sinus tachycardia ST & T wave abnormality, consider lateral ischemia Abnormal ECG No significant change since last tracing Confirmed by Pryor Curia [id number]) on 12/06/2020 4:51:59 AM         ____________________________________________  RADIOLOGY Jessie Foot Laker Thompson, personally viewed and evaluated these images (plain radiographs) as part of my medical decision making, as well as reviewing the written report by the radiologist.  ED MD interpretation: Chest x-ray clear.  Official radiology report(s): DG Chest 2 View  Result Date: 12/06/2020 CLINICAL DATA:  Abdominal pain, palpitations EXAM: CHEST - 2 VIEW COMPARISON:  07/17/2020 FINDINGS: Lungs are clear.  No pleural effusion or pneumothorax. The heart is normal in size. Degenerative changes of the visualized thoracolumbar spine. IMPRESSION: Normal chest radiographs. Electronically Signed   By: Julian Hy M.D.   On: 12/06/2020 01:16    ____________________________________________   PROCEDURES  Procedure(s) performed (including Critical Care):  Procedures    ____________________________________________   INITIAL IMPRESSION / ASSESSMENT AND PLAN / ED COURSE  As part of my medical decision making, I reviewed the following data within the [place] History obtained from family, Nursing notes reviewed and incorporated, Labs reviewed , EKG interpreted , Old EKG reviewed, Old chart reviewed, Radiograph reviewed , and Notes from prior ED visits          Patient here with palpitations, atypical chest pain.  Symptoms have improved but still complaining of "indigestion".  Labs unrevealing other than slightly low potassium of 3.3.  Will give oral replacement.  Magnesium normal.  Troponin x1 normal.  Chest x-ray clear.  EKG shows lateral T wave inversions but these have been present since 2013.  No new ischemia.  He was in a sinus tachycardia but this has resolved.  No risk factors for PE.  Doubt dissection.  Second troponin, TSH pending.  Will give GI cocktail, Protonix for his indigestion symptoms.  ED PROGRESS  Patient second troponin is normal, flat.  TSH normal.  No longer having palpitations and continues to be in a sinus rhythm, rate controlled on cardiac monitoring.  No arrhythmia noted.  Patient reports feeling better after GI cocktail, Protonix.  I feel he is safe for discharge home with close follow-up with his PCP.  Patient and wife are comfortable with this plan.  Will give cardiology follow-up information as well if symptoms return.  At this time, I do not feel there is any life-threatening condition present. I have reviewed, interpreted and discussed all results (EKG, imaging, lab, urine as appropriate) and exam findings with patient/family. I have reviewed nursing notes and appropriate previous records.  I feel the patient is safe to be discharged home without further emergent workup and can continue workup as an outpatient as needed. Discussed usual and customary return precautions. Patient/family verbalize understanding and are comfortable with this plan.  Outpatient follow-up has been provided as needed. All questions have been answered.  ____________________________________________   FINAL CLINICAL IMPRESSION(S) / ED DIAGNOSES  Final diagnoses:  Palpitations  Atypical chest pain     ED Discharge Orders     None       *Please note:  GALEN RUSSMAN was evaluated in Emergency Department on 12/06/2020 for the symptoms  described in the history of present illness. He was evaluated in the context of the global COVID-19 pandemic, which necessitated consideration that the patient might be at risk for infection with the SARS-CoV-2 virus that causes COVID-19. Institutional protocols and algorithms that pertain to the evaluation of patients at risk for COVID-19 are in a state of rapid change based on information released by regulatory bodies including the CDC and federal and state organizations. These policies and algorithms were followed during the patient's care in the ED.  Some ED evaluations and interventions may be delayed as a result of limited staffing during and the pandemic.*   Note:  This document was prepared using Dragon voice recognition software and may include unintentional dictation errors.    Future [place], Delice Bison, DO 12/06/20 [id number]

## 2020-12-06 NOTE — ED Triage Notes (Signed)
Pt reports he took a shower and when he got out, he broke out in a cold sweat and since then his heart has been racing on and off. Pain in the upper abdomen that started about the same. Some SOB. Stating hx of CHF.

## 2020-12-19 ENCOUNTER — Other Ambulatory Visit: Payer: Self-pay | Admitting: Internal Medicine

## 2020-12-31 ENCOUNTER — Other Ambulatory Visit: Payer: Self-pay | Admitting: Internal Medicine

## 2021-01-08 ENCOUNTER — Other Ambulatory Visit: Payer: Self-pay

## 2021-01-08 ENCOUNTER — Emergency Department
Admission: EM | Admit: 2021-01-08 | Discharge: 2021-01-08 | Disposition: A | Discharge status: Home / Self Care | Payer: Medicare Other | Attending: Emergency Medicine | Admitting: Emergency Medicine

## 2021-01-08 DIAGNOSIS — N400 Enlarged prostate without lower urinary tract symptoms: Secondary | ICD-10-CM | POA: Diagnosis not present

## 2021-01-08 DIAGNOSIS — Z87442 Personal history of urinary calculi: Secondary | ICD-10-CM | POA: Insufficient documentation

## 2021-01-08 DIAGNOSIS — R109 Unspecified abdominal pain: Secondary | ICD-10-CM | POA: Insufficient documentation

## 2021-01-08 DIAGNOSIS — Z5321 Procedure and treatment not carried out due to patient leaving prior to being seen by health care provider: Secondary | ICD-10-CM | POA: Diagnosis not present

## 2021-01-08 DIAGNOSIS — R39198 Other difficulties with micturition: Secondary | ICD-10-CM | POA: Diagnosis not present

## 2021-01-08 LAB — CBC
HCT: 45.2 % (ref 39.0–52.0)
Hemoglobin: 15.7 g/dL (ref 13.0–17.0)
MCH: 30.9 pg (ref 26.0–34.0)
MCHC: 34.7 g/dL (ref 30.0–36.0)
MCV: 89 fL (ref 80.0–100.0)
Platelets: 365 10*3/uL (ref 150–400)
RBC: 5.08 MIL/uL (ref 4.22–5.81)
RDW: 11.9 % (ref 11.5–15.5)
WBC: 14.3 10*3/uL — ABNORMAL HIGH (ref 4.0–10.5)
nRBC: 0 % (ref 0.0–0.2)

## 2021-01-08 LAB — BASIC METABOLIC PANEL
Anion gap: 7 (ref 5–15)
BUN: 14 mg/dL (ref 6–20)
CO2: 27 mmol/L (ref 22–32)
Calcium: 9.1 mg/dL (ref 8.9–10.3)
Chloride: 104 mmol/L (ref 98–111)
Creatinine, Ser: 1.27 mg/dL — ABNORMAL HIGH (ref 0.61–1.24)
GFR, Estimated: >60 mL/min (ref >60)
Glucose, Bld: 118 mg/dL — ABNORMAL HIGH (ref 70–99)
Potassium: 3.6 mmol/L (ref 3.5–5.1)
Sodium: 138 mmol/L (ref 135–145)

## 2021-01-08 NOTE — ED Notes (Signed)
No answer in lobby for vitals signs.  Screener states "Pt left at approx 2030"

## 2021-01-08 NOTE — ED Triage Notes (Signed)
Pt with hx of kidney stones, and with c/o left flank pain.  pt has hydrocodone at home and took 1 at 10 and 1 and 2 pm without relief. Pt states he had a CT scan this past Monday that said he had kidney stones but it has been manageable up to today. Pt states he is having some difficulty urinating but also has an enlarged prostate. Pt denies  blood in urine.

## 2021-01-11 ENCOUNTER — Telehealth: Payer: Self-pay | Admitting: Medical Oncology

## 2021-01-11 NOTE — Telephone Encounter (Signed)
Pt was contacted about LWBS and his elevated creat. Pt advised that he is following up with his PCP. Informed pt that he needed to make sure that he had a plan to see them soon/go to urology. Pt verbally acknowledges.

## 2021-01-17 ENCOUNTER — Other Ambulatory Visit: Payer: Self-pay

## 2021-01-17 ENCOUNTER — Ambulatory Visit (INDEPENDENT_AMBULATORY_CARE_PROVIDER_SITE_OTHER): Payer: Medicare Other

## 2021-01-17 DIAGNOSIS — I5022 Chronic systolic (congestive) heart failure: Secondary | ICD-10-CM | POA: Diagnosis not present

## 2021-01-17 DIAGNOSIS — R0600 Dyspnea, unspecified: Secondary | ICD-10-CM

## 2021-01-17 DIAGNOSIS — R0609 Other forms of dyspnea: Secondary | ICD-10-CM

## 2021-01-17 LAB — ECHOCARDIOGRAM COMPLETE
AR max vel: 4.22 cm2
AV Area VTI: 4.48 cm2
AV Area mean vel: 4.2 cm2
AV Mean grad: 2 mmHg
AV Peak grad: 4.3 mmHg
Ao pk vel: 1.04 m/s
Area-P 1/2: 3.12 cm2

## 2021-01-17 MED ORDER — PERFLUTREN LIPID MICROSPHERE
1.0000 mL | INTRAVENOUS | Status: AC | PRN
  Administered 2021-01-17: 2 mL via INTRAVENOUS

## 2021-02-04 ENCOUNTER — Emergency Department: Payer: Medicare Other

## 2021-02-04 ENCOUNTER — Emergency Department
Admission: EM | Admit: 2021-02-04 | Discharge: 2021-02-04 | Disposition: A | Discharge status: Home / Self Care | Payer: Medicare Other | Attending: Emergency Medicine | Admitting: Emergency Medicine

## 2021-02-04 ENCOUNTER — Other Ambulatory Visit: Payer: Self-pay

## 2021-02-04 DIAGNOSIS — I2510 Atherosclerotic heart disease of native coronary artery without angina pectoris: Secondary | ICD-10-CM | POA: Insufficient documentation

## 2021-02-04 DIAGNOSIS — R109 Unspecified abdominal pain: Secondary | ICD-10-CM

## 2021-02-04 DIAGNOSIS — I110 Hypertensive heart disease with heart failure: Secondary | ICD-10-CM | POA: Insufficient documentation

## 2021-02-04 DIAGNOSIS — Z79899 Other long term (current) drug therapy: Secondary | ICD-10-CM | POA: Diagnosis not present

## 2021-02-04 DIAGNOSIS — R0789 Other chest pain: Secondary | ICD-10-CM | POA: Insufficient documentation

## 2021-02-04 DIAGNOSIS — R1033 Periumbilical pain: Secondary | ICD-10-CM | POA: Insufficient documentation

## 2021-02-04 DIAGNOSIS — Z8616 Personal history of COVID-19: Secondary | ICD-10-CM | POA: Insufficient documentation

## 2021-02-04 DIAGNOSIS — I5022 Chronic systolic (congestive) heart failure: Secondary | ICD-10-CM | POA: Diagnosis not present

## 2021-02-04 HISTORY — DX: Crohn's disease, unspecified, without complications: K50.90

## 2021-02-04 LAB — BASIC METABOLIC PANEL
Anion gap: 11 (ref 5–15)
BUN: 15 mg/dL (ref 6–20)
CO2: 27 mmol/L (ref 22–32)
Calcium: 9.4 mg/dL (ref 8.9–10.3)
Chloride: 99 mmol/L (ref 98–111)
Creatinine, Ser: 1.08 mg/dL (ref 0.61–1.24)
GFR, Estimated: >60 mL/min (ref >60)
Glucose, Bld: 113 mg/dL — ABNORMAL HIGH (ref 70–99)
Potassium: 3 mmol/L — ABNORMAL LOW (ref 3.5–5.1)
Sodium: 137 mmol/L (ref 135–145)

## 2021-02-04 LAB — CBC
HCT: 47.9 % (ref 39.0–52.0)
Hemoglobin: 16.8 g/dL (ref 13.0–17.0)
MCH: 31.2 pg (ref 26.0–34.0)
MCHC: 35.1 g/dL (ref 30.0–36.0)
MCV: 89 fL (ref 80.0–100.0)
Platelets: 400 10*3/uL (ref 150–400)
RBC: 5.38 MIL/uL (ref 4.22–5.81)
RDW: 11.8 % (ref 11.5–15.5)
WBC: 12.9 10*3/uL — ABNORMAL HIGH (ref 4.0–10.5)
nRBC: 0 % (ref 0.0–0.2)

## 2021-02-04 LAB — TROPONIN I (HIGH SENSITIVITY): Troponin I (High Sensitivity): 13 ng/L (ref <18)

## 2021-02-04 MED ORDER — ALUM & MAG HYDROXIDE-SIMETH 200-200-20 MG/5ML PO SUSP
30.0000 mL | Freq: Once | ORAL | Status: AC
  Administered 2021-02-04: 30 mL via ORAL
  Filled 2021-02-04: qty 30

## 2021-02-04 MED ORDER — LIDOCAINE VISCOUS HCL 2 % MT SOLN
15.0000 mL | Freq: Once | OROMUCOSAL | Status: AC
  Administered 2021-02-04: 15 mL via ORAL
  Filled 2021-02-04: qty 15

## 2021-02-04 MED ORDER — LIDOCAINE VISCOUS HCL 2 % MT SOLN: 15.0000 mL | Freq: Four times a day (QID) | OROMUCOSAL | 0 refills | Status: DC | PRN

## 2021-02-04 MED ORDER — SUCRALFATE 1 G PO TABS: 1.0000 g | ORAL_TABLET | Freq: Four times a day (QID) | ORAL | 0 refills | Status: DC

## 2021-02-04 MED ORDER — DICYCLOMINE HCL 10 MG PO CAPS
20.0000 mg | ORAL_CAPSULE | Freq: Once | ORAL | Status: AC
  Administered 2021-02-04: 20 mg via ORAL
  Filled 2021-02-04: qty 2

## 2021-02-04 MED ORDER — DICYCLOMINE HCL 10 MG PO CAPS: 10.0000 mg | ORAL_CAPSULE | Freq: Three times a day (TID) | ORAL | 0 refills | Status: DC | PRN

## 2021-02-04 NOTE — Discharge Instructions (Addendum)
Please seek medical attention for any high fevers, chest pain, shortness of breath, change in behavior, persistent vomiting, bloody stool or any other new or concerning symptoms.  

## 2021-02-04 NOTE — ED Triage Notes (Signed)
Pt states he has been "feeling bad" for about a week- pt now having chest pain, light headedness, and states that "both hands are cold"- pt states he was recently diagnosed with Crohn's and has had diarrhea for the past week

## 2021-02-04 NOTE — ED Provider Notes (Signed)
[facility] Emergency Department Provider Note   ____________________________________________   I have reviewed the triage vital signs and the nursing notes.   HISTORY  Chief Complaint Chest Pain   History limited by: Not Limited   HPI Aaron [name] is a 55 y.o. male who presents to the emergency department today because of concerns for chest and abdominal pain.  Patient states that his symptoms started about 2 weeks ago.  He states the pain will go from his middle chest all the way down to periumbilical region.  The patient states that he has been trying Tums and other similar medications at home without any significant relief.  Patient states he does have a history of kidney stones and did have some pain to his flanks a few weeks ago he denies any similar symptoms recently. No fevers.  Records reviewed. Per medical record review patient has a history of recent ER visit.  Past Medical History:  Diagnosis Date   Chronic back pain    disc   Coronary artery disease    Crohn's disease ([place])    HFrEF (heart failure with reduced ejection fraction) ([place])    Hypertension    Kidney stone    MDD (major depressive disorder)     Patient Active Problem List   Diagnosis Date Noted   Nonischemic cardiomyopathy ([place]) 10/22/2020   PSVT (paroxysmal supraventricular tachycardia) ([place]) 10/22/2020   Cough 07/07/2020   Chronic HFrEF (heart failure with reduced ejection fraction) ([place]) 07/07/2020   CAD in native artery 07/07/2020   Obstructive sleep apnea 07/07/2020   Abnormal stress test 06/27/2020   Hypokalemia 05/18/2020   Elevated troponin 05/18/2020   Pneumonia due to COVID-19 virus 05/11/2020   Unspecified atrial fibrillation ([place]) 05/11/2020   Acute respiratory failure due to COVID-19 ([place]) 05/11/2020   Palpitations 03/31/2020   Abnormal EKG 03/31/2020   Shortness of breath 03/31/2020   Chest pain 12/17/2019   Amaurosis fugax 12/17/2019   MDD (major  depressive disorder), single episode, moderate ([place]) 11/19/2018   Bereavement 11/19/2018   GAD (generalized anxiety disorder) 11/19/2018   Insomnia due to mental condition 11/19/2018   ADD (attention deficit disorder) 09/02/2018   Essential hypertension 09/02/2018   Kidney stones 09/02/2018   Murmur 09/02/2018   Morbid obesity ([place]) 05/21/2018   Morbid obesity with BMI of 45.0-49.9, adult ([place]) 09/20/2015    Past Surgical History:  Procedure Laterality Date   CARDIAC CATHETERIZATION     KIDNEY STONE SURGERY     LEFT HEART CATH AND CORONARY ANGIOGRAPHY Left 06/27/2020   Procedure: LEFT HEART CATH AND CORONARY ANGIOGRAPHY;  Surgeon: Nelva Bush, MD;  Location: [facility] [facility];  Service: Cardiovascular;  Laterality: Left;   VASECTOMY      Prior to Admission medications   Medication Sig Start Date End Date Taking? Authorizing Provider  acetaminophen (TYLENOL) 325 MG tablet Take 650-975 mg by mouth every 6 (six) hours as needed for moderate pain or headache (for pain.).    [provider]  albuterol (VENTOLIN HFA) 108 (90 Base) MCG/ACT inhaler Inhale 2 puffs into the lungs every 6 (six) hours as needed for wheezing or shortness of breath. 09/05/20   Parrett, Fonnie Mu, NP  cetirizine (ZYRTEC) 10 MG tablet Take 1 tablet (10 mg total) by mouth daily as needed (allergies.). 05/12/20   Enzo Bi, MD  fluticasone (FLONASE) 50 MCG/ACT nasal spray Place 2 sprays into both nostrils daily as needed (allergies.). 05/12/20   Enzo Bi, MD  furosemide (LASIX)  40 MG tablet TAKE 1 TABLET BY MOUTH EVERY DAY 01/01/21   End, Harrell Gave, MD  ibuprofen (ADVIL) 200 MG tablet Take 600 mg by mouth every 6 (six) hours as needed for headache.    [provider]  ipratropium (ATROVENT HFA) 17 MCG/ACT inhaler Inhale 2 puffs into the lungs every 6 (six) hours.    [provider]  losartan (COZAAR) 100 MG tablet Take 1 tablet (100 mg total) by mouth daily. 09/12/20 09/07/21  Loel Dubonnet, NP  metoprolol succinate (TOPROL-XL) 50 MG 24 hr tablet Take 1 tablet (50 mg total) by mouth daily. Take with or immediately following a meal. 11/22/20 02/20/21  Vickie Epley, MD  montelukast (SINGULAIR) 10 MG tablet Take 1 tablet (10 mg total) by mouth at bedtime as needed (allergies.). 05/12/20   Enzo Bi, MD  pregabalin (LYRICA) 100 MG capsule Take 100 mg by mouth at bedtime. 11/15/20   [provider]  spironolactone (ALDACTONE) 50 MG tablet Take 1 tablet (50 mg total) by mouth daily. 10/20/20   End, Harrell Gave, MD  traMADol HCl 100 MG TABS Take 1 tablet by mouth 3 (three) times daily as needed. 11/15/20   [provider]  Vitamin D, Ergocalciferol, (DRISDOL) 1.25 MG (50000 UNIT) CAPS capsule Take 1 capsule by mouth once a week. 11/07/20   [provider]    Allergies Patient has no known allergies.  Family History  Problem Relation Age of Onset   COPD Mother    Cancer Mother    Hypertension Father    Congestive Heart Failure Father    COPD Father    Lung cancer Father    Heart attack Father 27   Hypertension Sister    Diabetes Paternal Grandfather    Heart attack Paternal Grandfather    Healthy Son    Healthy Daughter     Social History Social History   Tobacco Use   Smoking status: Never   Smokeless tobacco: Never  Vaping Use   Vaping Use: Never used  Substance Use Topics   Alcohol use: Not Currently   Drug use: Never    Review of Systems Constitutional: No fever/chills Eyes: No visual changes. ENT: No sore throat. Cardiovascular: Positive for chest pain. Respiratory: Positive for shortness of breath. Gastrointestinal: No abdominal pain.  No nausea, no vomiting.  No diarrhea.   Genitourinary: Negative for dysuria. Musculoskeletal: Negative for back pain. Skin: Negative for rash. Neurological: Negative for headaches, focal weakness or numbness.  ____________________________________________   PHYSICAL EXAM:  VITAL SIGNS: ED  Triage Vitals  Enc Vitals Group     BP 02/04/21 1654 (!) 139/119     Pulse Rate 02/04/21 1654 90     Resp 02/04/21 1654 18     Temp 02/04/21 1654 97.9 F (36.6 C)     Temp Source 02/04/21 1654 Oral     SpO2 02/04/21 1654 99 %     Weight 02/04/21 1654 290 lb (131.5 kg)     Height 02/04/21 1654 5' 9"  (1.753 m)     Head Circumference --      Peak Flow --      Pain Score 02/04/21 1702 7   Constitutional: Alert and oriented.  Eyes: Conjunctivae are normal.  ENT      Head: Normocephalic and atraumatic.      Nose: No congestion/rhinnorhea.      Mouth/Throat: Mucous membranes are moist.      Neck: No stridor. Hematological/Lymphatic/Immunilogical: No cervical lymphadenopathy. Cardiovascular: Normal rate, regular  rhythm.  No murmurs, rubs, or gallops.  Respiratory: Normal respiratory effort without tachypnea nor retractions. Breath sounds are clear and equal bilaterally. No wheezes/rales/rhonchi. Gastrointestinal: Soft and non tender. No rebound. No guarding.  Genitourinary: Deferred Musculoskeletal: Normal range of motion in all extremities. No lower extremity edema. Neurologic:  Normal speech and language. No gross focal neurologic deficits are appreciated.  Skin:  Skin is warm, dry and intact. No rash noted. Psychiatric: Mood and affect are normal. Speech and behavior are normal. Patient exhibits appropriate insight and judgment.  ____________________________________________    LABS (pertinent positives/negatives)  BMP na 137, k 3.0, glu 113, cr 1.08 CBC wbc 12.9, hgb 16.8, plt 400 Trop hs 13  ____________________________________________   EKG  I, Nance Pear, attending physician, personally viewed and interpreted this EKG  EKG Time: 1659 Rate: 89 Rhythm: sinus rhythm with 1st degree av block and pac Axis: left axis deviation Intervals: qtc 472 QRS: narrow ST changes: no st elevation Impression: abnormal ekg  ____________________________________________     RADIOLOGY CXR No acute findings  ____________________________________________   PROCEDURES  Procedures  ____________________________________________   INITIAL IMPRESSION / ASSESSMENT AND PLAN / ED COURSE  Pertinent labs & imaging results that were available during my care of the patient were reviewed by me and considered in my medical decision making (see chart for details).   Patient presented to the emergency department today with concerns for both chest and abdominal pain.  On exam no significant abdominal tenderness.  Heart and lung sounds normal.  Blood work without any findings that would suggest cardiac etiology of the patient's symptoms.  Additionally EKG without any concerning findings.  Chest x-ray without any pneumonia or pneumothorax.  Given the length of symptoms I have lower suspicion for PE given lack of hypoxia or tachycardia.  Additionally I would have a low suspicion for dissection.  Patient was given GI cocktail and Bentyl and did feel some relief.  At this time I do think either GERD or IBS type symptoms likely.  I discussed this with the patient.  He does have follow-up appointment already scheduled with GI. ____________________________________________   FINAL CLINICAL IMPRESSION(S) / ED DIAGNOSES  Final diagnoses:  Abdominal pain, unspecified abdominal location     Note: This dictation was prepared with Dragon dictation. Any transcriptional errors that result from this process are unintentional     Nance Pear, MD 02/04/21 2349

## 2021-02-05 ENCOUNTER — Other Ambulatory Visit: Payer: Self-pay | Admitting: Internal Medicine

## 2021-02-09 ENCOUNTER — Ambulatory Visit (INDEPENDENT_AMBULATORY_CARE_PROVIDER_SITE_OTHER): Payer: Medicare Other | Admitting: Internal Medicine

## 2021-02-09 ENCOUNTER — Encounter: Payer: Self-pay | Admitting: Internal Medicine

## 2021-02-09 ENCOUNTER — Other Ambulatory Visit: Payer: Self-pay

## 2021-02-09 VITALS — BP 130/80 | HR 78 | Ht 69.0 in | Wt 290.0 lb

## 2021-02-09 DIAGNOSIS — I4891 Unspecified atrial fibrillation: Secondary | ICD-10-CM

## 2021-02-09 DIAGNOSIS — I10 Essential (primary) hypertension: Secondary | ICD-10-CM

## 2021-02-09 DIAGNOSIS — I440 Atrioventricular block, first degree: Secondary | ICD-10-CM

## 2021-02-09 DIAGNOSIS — R109 Unspecified abdominal pain: Secondary | ICD-10-CM

## 2021-02-09 DIAGNOSIS — R079 Chest pain, unspecified: Secondary | ICD-10-CM | POA: Diagnosis not present

## 2021-02-09 DIAGNOSIS — I5022 Chronic systolic (congestive) heart failure: Secondary | ICD-10-CM

## 2021-02-09 MED ORDER — PANTOPRAZOLE SODIUM 40 MG PO TBEC: 40.0000 mg | DELAYED_RELEASE_TABLET | Freq: Every day | ORAL | 11 refills | Status: DC

## 2021-02-09 NOTE — Progress Notes (Signed)
Follow-up Outpatient Visit Date: 02/09/2021  Primary Care Provider: [facility] [address] [place] [address]  Chief Complaint: Follow-up chest pain and cardiomyopathy  HPI:  Mr. Mcguire is a 55 y.o. male with history of  mild, nonobstructive CAD, nonischemic cardiomyopathy (LVEF ~40% by left ventriculogram in 06/2020), HTN, kidney stones, morbid obesity, obstructive sleep apnea, and COVID-19 infection (05/2020), who presents for follow-up of nonischemic cardiomyopathy.  He was last seen in our office by Marrianne Mood, PA, in late June, followed shortly thereafter by Dr. Quentin Ore for assessment of frequent PACs and first-degree AV block.  No medication changes were made.  Today, Mr. Brandt reports that he is feeling a little better.  He has continued to have episodic chest and abdominal pain, recently having been diagnosed with some sort of an underlying GI illness driving his symptoms (he was told it could be IBS, Crohn's disease, or ulcerative colitis).  He is scheduled for GI consultation later this month.  He has been eating better and feels like this is helping his symptoms.  He is also taking omeprazole and sucralfate with some improvement.  He denies exertional chest pain.  He does not have any significant shortness of breath unless he is experiencing chest and abdominal discomfort.  He also denies palpitations, lightheadedness, and edema.  --------------------------------------------------------------------------------------------------  Past Medical History:  Diagnosis Date   Chronic back pain    disc   Coronary artery disease    Crohn's disease ([facility])    HFrEF (heart failure with reduced ejection fraction) ([place])    Hypertension    Kidney stone    MDD (major depressive disorder)    Past Surgical History:  Procedure Laterality Date   CARDIAC CATHETERIZATION     KIDNEY STONE SURGERY     LEFT HEART CATH AND CORONARY ANGIOGRAPHY Left 06/27/2020    Procedure: LEFT HEART CATH AND CORONARY ANGIOGRAPHY;  Surgeon: Nelva Bush, MD;  Location: [facility];  Service: Cardiovascular;  Laterality: Left;   VASECTOMY       Current Meds  Medication Sig   acetaminophen (TYLENOL) 325 MG tablet Take 650-975 mg by mouth every 6 (six) hours as needed for moderate pain or headache (for pain.).   albuterol (VENTOLIN HFA) 108 (90 Base) MCG/ACT inhaler Inhale 2 puffs into the lungs every 6 (six) hours as needed for wheezing or shortness of breath.   cetirizine (ZYRTEC) 10 MG tablet Take 1 tablet (10 mg total) by mouth daily as needed (allergies.).   dicyclomine (BENTYL) 10 MG capsule Take 1 capsule (10 mg total) by mouth 3 (three) times daily as needed for up to 14 days (abdominal pain).   fluticasone (FLONASE) 50 MCG/ACT nasal spray Place 2 sprays into both nostrils daily as needed (allergies.).   furosemide (LASIX) 40 MG tablet TAKE 1 TABLET BY MOUTH EVERY DAY   ibuprofen (ADVIL) 200 MG tablet Take 600 mg by mouth every 6 (six) hours as needed for headache.   ipratropium (ATROVENT HFA) 17 MCG/ACT inhaler Inhale 2 puffs into the lungs every 6 (six) hours.   losartan (COZAAR) 100 MG tablet Take 1 tablet (100 mg total) by mouth daily.   metoprolol succinate (TOPROL-XL) 50 MG 24 hr tablet Take 1 tablet (50 mg total) by mouth daily. Take with or immediately following a meal.   montelukast (SINGULAIR) 10 MG tablet Take 1 tablet (10 mg total) by mouth at bedtime as needed (allergies.).   pregabalin (LYRICA) 100 MG capsule Take 100 mg by mouth at  bedtime.   spironolactone (ALDACTONE) 50 MG tablet TAKE 1 TABLET BY MOUTH EVERY DAY   sucralfate (CARAFATE) 1 g tablet Take 1 tablet (1 g total) by mouth 4 (four) times daily.   traMADol HCl 100 MG TABS Take 1 tablet by mouth 3 (three) times daily as needed.   Vitamin D, Ergocalciferol, (DRISDOL) 1.25 MG (50000 UNIT) CAPS capsule Take 1 capsule by mouth once a week.    Allergies: Patient has no known  allergies.  Social History   Tobacco Use   Smoking status: Never   Smokeless tobacco: Never  Vaping Use   Vaping Use: Never used  Substance Use Topics   Alcohol use: Not Currently   Drug use: Never    Family History  Problem Relation Age of Onset   COPD Mother    Cancer Mother    Hypertension Father    Congestive Heart Failure Father    COPD Father    Lung cancer Father    Heart attack Father 37   Hypertension Sister    Diabetes Paternal Grandfather    Heart attack Paternal Grandfather    Healthy Son    Healthy Daughter     Review of Systems: A 12-system review of systems was performed and was negative except as noted in the HPI.  --------------------------------------------------------------------------------------------------  Physical Exam: BP 130/80 (BP Location: Left Arm, Patient Position: Sitting, Cuff Size: Large)   Pulse 78   Ht 5' 9"  (1.753 m)   Wt 290 lb (131.5 kg)   SpO2 98%   BMI 42.83 kg/m   General:  NAD. Neck: No JVD or HJR. Lungs: Clear to auscultation bilaterally without wheezes or crackles. Heart: Regular rate and rhythm without murmurs, rubs, or gallops. Abdomen: Soft with mild epigastric tenderness. Extremities: No lower extremity edema.  EKG: Normal sinus rhythm with first-degree AV block and anterolateral ST/T changes.  No significant change from prior tracing on 11/20/2020.  Lab Results  Component Value Date   WBC 12.9 (H) 02/04/2021   HGB 16.8 02/04/2021   HCT 47.9 02/04/2021   MCV 89.0 02/04/2021   PLT 400 02/04/2021    Lab Results  Component Value Date   NA 137 02/04/2021   K 3.0 (L) 02/04/2021   CL 99 02/04/2021   CO2 27 02/04/2021   BUN 15 02/04/2021   CREATININE 1.08 02/04/2021   GLUCOSE 113 (H) 02/04/2021   ALT 37 12/06/2020    Lab Results  Component Value Date   CHOL 96 05/19/2020   HDL 27 (L) 05/19/2020   LDLCALC 55 05/19/2020   TRIG 70 05/19/2020   CHOLHDL 3.6 05/19/2020     --------------------------------------------------------------------------------------------------  ASSESSMENT AND PLAN: Chronic HFrEF: Mr. Gloster is doing better from a symptom standpoint (NYHA class II-III) and appears grossly euvolemic on examination.  We will continue his current regimen of metoprolol, losartan, spironolactone, and furosemide.  We could consider addition of an SGLT2 inhibitor in the future, though I would like to defer this until he has completed his GI consultation.  First-degree AV block and PAC's: PR interval slightly less than on prior EKGs.  Continue current dose of metoprolol with ongoing EP follow-up.  Hypertension: Blood pressure upper normal today.  Continue current medications.  Chest/abdominal pain: Symptoms most likely related to underlying GI pathology.  GI consultation scheduled later this month.  We will switch from omeprazole to pantoprazole 40 mg daily to see if this offers some additional relief pending GI visit.  Morbid obesity: BMI remains greater than 40.  Weight loss encouraged through diet and exercise.  Follow-up: Return to clinic in 4 months.  Nelva Bush, MD 02/09/2021 4:10 PM

## 2021-02-09 NOTE — Patient Instructions (Signed)
Medication Instructions:   Your physician has recommended you make the following change in your medication:   STOP Taking Prilosec   START Protonix (Pantoprazole) 40 mg daily - An Rx has been sent to your pharmacy  *If you need a refill on your cardiac medications before your next appointment, please call your pharmacy*   Lab Work:  None ordered  Testing/Procedures:  None ordered   Follow-Up: At [facility], you and your health needs are our priority.  As part of our continuing mission to provide you with exceptional heart care, we have created designated Provider Care Teams.  These Care Teams include your primary Cardiologist (physician) and Advanced Practice Providers (APPs -  Physician Assistants and Nurse Practitioners) who all work together to provide you with the care you need, when you need it.  We recommend signing up for the patient portal called "MyChart".  Sign up information is provided on this After Visit Summary.  MyChart is used to connect with patients for Virtual Visits (Telemedicine).  Patients are able to view lab/test results, encounter notes, upcoming appointments, etc.  Non-urgent messages can be sent to your provider as well.   To learn more about what you can do with MyChart, go to [email or web address].    Your next appointment:   4 month(s)  The format for your next appointment:   In Person  Provider:   You may see Nelva Bush, MD or one of the following Advanced Practice Providers on your designated Care Team:   Murray Hodgkins, NP Christell Faith, PA-C Marrianne Mood, PA-C Cadence [place], [place]

## 2021-02-10 ENCOUNTER — Encounter: Payer: Self-pay | Admitting: Internal Medicine

## 2021-02-10 DIAGNOSIS — R109 Unspecified abdominal pain: Secondary | ICD-10-CM

## 2021-02-10 DIAGNOSIS — I440 Atrioventricular block, first degree: Secondary | ICD-10-CM | POA: Insufficient documentation

## 2021-02-10 HISTORY — DX: Unspecified abdominal pain: R10.9

## 2021-02-12 NOTE — Progress Notes (Signed)
02/13/21 2:31 PM   [name] 12-22-1965 [id number]  Referring provider:  [facility] [address] [place],  [place] [address] Chief Complaint  Patient presents with   Benign Prostatic Hypertrophy     HPI: Aaron [name] is a 55 y.o.male who presents today for further evaluation of enlarged prostate and calcifications.   He underwent a CT abdomen and pelvis noncontrast on 01/02/2021 that revealed an enlarged prostate gland measuring 5.0 cm in transverse diameter with focal calcifications. It also revealed 5 mm proximal left ureteral calculus without significant ureterectasis or pyelocaliectasis, a 5 mm nonobstructive right upper pole renal calculus and a 7.25 cm exophytic left upper pole renal cyst.   PSA 0.61 on 01/01/2021.   His most recent creatinine 1.08 on 02/04/2021.   Urine today shows microscopic blood in urine.   He is accompanied by is wife today.Marland Kitchen He reports today that he initially sought care for urology due to heart problems which lead to the CT scan; stones were an incidental finding.   He reports that he had surgery for kidney stones about 20 years ago. He has had 5 incidences of kidney stones.  He reports that he was recently diagnosed with ulcerative colitis.  He has lost 25 pounds due to his UC.    IPSS     Row Name 02/13/21 1400         International Prostate Symptom Score   How often have you had the sensation of not emptying your bladder? Not at All     How often have you had to urinate less than every two hours? Less than half the time     How often have you found you stopped and started again several times when you urinated? Not at All     How often have you found it difficult to postpone urination? Less than half the time     How often have you had a weak urinary stream? About half the time     How often have you had to strain to start urination? Not at All     How many times did you typically get up at night to urinate? 1  Time     Total IPSS Score 8           Quality of Life due to urinary symptoms   If you were to spend the rest of your life with your urinary condition just the way it is now how would you feel about that? Mostly Satisfied              Score:  1-7 Mild 8-19 Moderate 20-35 Severe   PMH: Past Medical History:  Diagnosis Date   Chronic back pain    disc   Coronary artery disease    Crohn's disease (HCC)    HFrEF (heart failure with reduced ejection fraction) ([place])    Hypertension    Kidney stone    MDD (major depressive disorder)     Surgical History: Past Surgical History:  Procedure Laterality Date   CARDIAC CATHETERIZATION     KIDNEY STONE SURGERY     LEFT HEART CATH AND CORONARY ANGIOGRAPHY Left 06/27/2020   Procedure: LEFT HEART CATH AND CORONARY ANGIOGRAPHY;  Surgeon: Nelva Bush, MD;  Location: [facility];  Service: Cardiovascular;  Laterality: Left;   VASECTOMY      Home Medications:  Allergies as of 02/13/2021   No Known Allergies      Medication List  Accurate as of February 13, 2021  2:31 PM. If you have any questions, ask your nurse or doctor.          acetaminophen 325 MG tablet Commonly known as: TYLENOL Take 650-975 mg by mouth every 6 (six) hours as needed for moderate pain or headache (for pain.).   albuterol 108 (90 Base) MCG/ACT inhaler Commonly known as: VENTOLIN HFA Inhale 2 puffs into the lungs every 6 (six) hours as needed for wheezing or shortness of breath.   cetirizine 10 MG tablet Commonly known as: ZYRTEC Take 1 tablet (10 mg total) by mouth daily as needed (allergies.).   dicyclomine 10 MG capsule Commonly known as: Bentyl Take 1 capsule (10 mg total) by mouth 3 (three) times daily as needed for up to 14 days (abdominal pain).   fluticasone 50 MCG/ACT nasal spray Commonly known as: FLONASE Place 2 sprays into both nostrils daily as needed (allergies.).   furosemide 40 MG tablet Commonly known  as: LASIX TAKE 1 TABLET BY MOUTH EVERY DAY   ibuprofen 200 MG tablet Commonly known as: ADVIL Take 600 mg by mouth every 6 (six) hours as needed for headache.   ipratropium 17 MCG/ACT inhaler Commonly known as: ATROVENT HFA Inhale 2 puffs into the lungs every 6 (six) hours.   lidocaine 2 % solution Commonly known as: XYLOCAINE Use as directed 15 mLs in the mouth or throat every 6 (six) hours as needed (abdominal pain).   losartan 100 MG tablet Commonly known as: COZAAR Take 1 tablet (100 mg total) by mouth daily.   metoprolol succinate 50 MG 24 hr tablet Commonly known as: TOPROL-XL Take 1 tablet (50 mg total) by mouth daily. Take with or immediately following a meal.   montelukast 10 MG tablet Commonly known as: SINGULAIR Take 1 tablet (10 mg total) by mouth at bedtime as needed (allergies.).   pantoprazole 40 MG tablet Commonly known as: PROTONIX Take 1 tablet (40 mg total) by mouth daily.   pregabalin 100 MG capsule Commonly known as: LYRICA Take 100 mg by mouth at bedtime.   spironolactone 50 MG tablet Commonly known as: ALDACTONE TAKE 1 TABLET BY MOUTH EVERY DAY   sucralfate 1 g tablet Commonly known as: Carafate Take 1 tablet (1 g total) by mouth 4 (four) times daily.   traMADol HCl 100 MG Tabs Take 1 tablet by mouth 3 (three) times daily as needed.   Vitamin D (Ergocalciferol) 1.25 MG (50000 UNIT) Caps capsule Commonly known as: DRISDOL Take 1 capsule by mouth once a week.        Allergies: No Known Allergies  Family History: Family History  Problem Relation Age of Onset   COPD Mother    Cancer Mother    Hypertension Father    Congestive Heart Failure Father    COPD Father    Lung cancer Father    Heart attack Father 22   Hypertension Sister    Diabetes Paternal Grandfather    Heart attack Paternal Grandfather    Healthy Son    Healthy Daughter     Social History:  reports that he has never smoked. He has never used smokeless tobacco. He  reports that he does not currently use alcohol. He reports that he does not use drugs.   Physical Exam: BP (!) 149/106   Pulse 76   Ht 5' 9"  (1.753 m)   Wt 290 lb (131.5 kg)   BMI 42.83 kg/m   Constitutional:  Alert and oriented, No acute  distress. HEENT: [place] AT, moist mucus membranes.  Trachea midline, no masses. Cardiovascular: No clubbing, cyanosis, or edema. Respiratory: Normal respiratory effort, no increased work of breathing. Skin: No rashes, bruises or suspicious lesions. Neurologic: Grossly intact, no focal deficits, moving all 4 extremities. Psychiatric: Normal mood and affect.  Laboratory Data:  Lab Results  Component Value Date   CREATININE 1.08 02/04/2021    Lab Results  Component Value Date   HGBA1C 6.2 (H) 05/19/2020    Urinalysis - Microscopic blood, 3-10 RBC, and otherwise unremarkable   Pertinent Imaging:  CT abdomen and pelvis found in referral note from [employer] . Was done in an outside.     I have personally reviewed CT from 2020 for comparison and he has the same  7 cm left upper pole renal cyst the. I agree with images and agree with radiologist interpretation.   Results for orders placed or performed in visit on 02/13/21  BLADDER SCAN AMB NON-IMAGING  Result Value Ref Range   Scan Result 0 ml      Assessment & Plan:    Left ureteral calculus / microscopic hematuria - Probable interval passage of left-sided stone given absence of symptoms, size of stone - Urine today showed microscopic blood; report urinalysis in 2 weeks to ensure this has cleared, likely related to acute stone - KUB to check if stone is still present or if it has passed; 2 weeks  Right kidney stone  - We discussed general stone prevention techniques including drinking plenty water with goal of producing 2.5 L urine daily, increased citric acid intake, avoidance of high oxalate containing foods, and decreased salt intake.  Information about dietary  recommendations given today.    BPH  - PSA low and normal  - PSA redraw today; pending  - incidentally noted on CT  - minimal urinary symptoms; will not treat  - PVR 0 mL today  -   4. Left renal cyst  - seen on previous imaging, stable and benign; no further evaluation needed.  - MRI in 2019 for further characterization was benign   Return in about 2 weeks (around 02/27/2021) for UA and KUB.  I,Kailey Littlejohn,acting as a Education administrator for Hollice Espy, MD.,have documented all relevant documentation on the behalf of Hollice Espy, MD,as directed by  Hollice Espy, MD while in the presence of Hollice Espy, MD.  I have reviewed the above documentation for accuracy and completeness, and I agree with the above.   Hollice Espy, MD   [facility] [address], [place], [place] [address] [phone]

## 2021-02-13 ENCOUNTER — Other Ambulatory Visit: Payer: Self-pay

## 2021-02-13 ENCOUNTER — Ambulatory Visit (INDEPENDENT_AMBULATORY_CARE_PROVIDER_SITE_OTHER): Payer: Medicare Other | Admitting: Urology

## 2021-02-13 VITALS — BP 149/106 | HR 76 | Ht 69.0 in | Wt 290.0 lb

## 2021-02-13 DIAGNOSIS — N200 Calculus of kidney: Secondary | ICD-10-CM

## 2021-02-13 DIAGNOSIS — R3129 Other microscopic hematuria: Secondary | ICD-10-CM | POA: Diagnosis not present

## 2021-02-13 DIAGNOSIS — I2510 Atherosclerotic heart disease of native coronary artery without angina pectoris: Secondary | ICD-10-CM | POA: Diagnosis not present

## 2021-02-13 DIAGNOSIS — N400 Enlarged prostate without lower urinary tract symptoms: Secondary | ICD-10-CM | POA: Diagnosis not present

## 2021-02-13 DIAGNOSIS — N281 Cyst of kidney, acquired: Secondary | ICD-10-CM | POA: Diagnosis not present

## 2021-02-13 LAB — BLADDER SCAN AMB NON-IMAGING: Scan Result: 0

## 2021-02-14 ENCOUNTER — Telehealth: Payer: Self-pay | Admitting: *Deleted

## 2021-02-14 LAB — URINALYSIS, COMPLETE
Bilirubin, UA: NEGATIVE
Glucose, UA: NEGATIVE
Ketones, UA: NEGATIVE
Leukocytes,UA: NEGATIVE
Nitrite, UA: NEGATIVE
RBC, UA: NEGATIVE
Specific Gravity, UA: 1.015 (ref 1.005–1.030)
Urobilinogen, Ur: 1 mg/dL (ref 0.2–1.0)
pH, UA: 8.5 — ABNORMAL HIGH (ref 5.0–7.5)

## 2021-02-14 LAB — MICROSCOPIC EXAMINATION: Bacteria, UA: NONE SEEN

## 2021-02-14 LAB — PSA: Prostate Specific Ag, Serum: 0.8 ng/mL (ref 0.0–4.0)

## 2021-02-14 NOTE — Telephone Encounter (Addendum)
Patient aware, voiced understanding.   ----- Message from Hollice Espy, MD sent at 02/14/2021  8:02 AM EDT ----- PSA is low, stable at 0.8   [facility]

## 2021-02-26 NOTE — Progress Notes (Signed)
02/27/2021 4:21 PM   [name] [birth year] [id number]  Referring provider: [facility] [address] [place],  [place] [address]  Chief Complaint  Patient presents with   Nephrolithiasis   Urological history: 1. BPH with LU TS -PSA 0.8 in 02/2021  2. Nephrolithiasis -surgery for kidney stones about 20 years ago -5 incidences of kidney stones -KUB no stone seen   3. Intermediate Risk Hematuria -non-smoker -CT abdomen and pelvis noncontrast on 01/02/2021 that revealed an enlarged prostate gland measuring 5.0 cm in transverse diameter with focal calcifications. It also revealed 5 mm proximal left ureteral calculus without significant ureterectasis or pyelocaliectasis, a 5 mm nonobstructive right upper pole renal calculus and a 7.25 cm exophytic left upper pole renal cyst -no reports of gross heme  -UA negative micro heme  4. Left renal cyst - seen on previous imaging, stable and benign; no further evaluation needed.  - MRI in 2019 for further characterization was benign   HPI: Aaron [name] is a 55 y.o. male who presents today for a two week follow up.  He has not had nay further renal colic.  He has not passed a fragment.  Patient denies any modifying or aggravating factors.  Patient denies any gross hematuria, dysuria or suprapubic/flank pain.  Patient denies any fevers, chills, nausea or vomiting.    UA negative for micro heme  KUB no stones seen     PMH: Past Medical History:  Diagnosis Date   Chronic back pain    disc   Coronary artery disease    Crohn's disease (HCC)    HFrEF (heart failure with reduced ejection fraction) (HCC)    Hypertension    Kidney stone    MDD (major depressive disorder)     Surgical History: Past Surgical History:  Procedure Laterality Date   CARDIAC CATHETERIZATION     KIDNEY STONE SURGERY     LEFT HEART CATH AND CORONARY ANGIOGRAPHY Left 06/27/2020   Procedure: LEFT HEART CATH AND CORONARY  ANGIOGRAPHY;  Surgeon: Nelva Bush, MD;  Location: [facility];  Service: Cardiovascular;  Laterality: Left;   VASECTOMY      Home Medications:  Allergies as of 02/27/2021   No Known Allergies      Medication List        Accurate as of February 27, 2021 11:59 PM. If you have any questions, ask your nurse or doctor.          STOP taking these medications    dicyclomine 10 MG capsule Commonly known as: Bentyl Stopped by: Zykee Avakian, PA-C   lidocaine 2 % solution Commonly known as: XYLOCAINE Stopped by: Hamlin Devine, PA-C   pantoprazole 40 MG tablet Commonly known as: PROTONIX Stopped by: Miko Markwood, PA-C   sucralfate 1 g tablet Commonly known as: Carafate Stopped by: Zara Council, PA-C       TAKE these medications    acetaminophen 325 MG tablet Commonly known as: TYLENOL Take 650-975 mg by mouth every 6 (six) hours as needed for moderate pain or headache (for pain.).   albuterol 108 (90 Base) MCG/ACT inhaler Commonly known as: VENTOLIN HFA Inhale 2 puffs into the lungs every 6 (six) hours as needed for wheezing or shortness of breath.   cetirizine 10 MG tablet Commonly known as: ZYRTEC Take 1 tablet (10 mg total) by mouth daily as needed (allergies.).   fluticasone 50 MCG/ACT nasal spray Commonly known as: FLONASE Place 2 sprays into both nostrils daily as  needed (allergies.).   furosemide 40 MG tablet Commonly known as: LASIX TAKE 1 TABLET BY MOUTH EVERY DAY   ibuprofen 200 MG tablet Commonly known as: ADVIL Take 600 mg by mouth every 6 (six) hours as needed for headache.   ipratropium 17 MCG/ACT inhaler Commonly known as: ATROVENT HFA Inhale 2 puffs into the lungs every 6 (six) hours.   losartan 100 MG tablet Commonly known as: COZAAR Take 1 tablet (100 mg total) by mouth daily.   metoprolol succinate 50 MG 24 hr tablet Commonly known as: TOPROL-XL Take 1 tablet (50 mg total) by mouth daily. Take with or  immediately following a meal.   montelukast 10 MG tablet Commonly known as: SINGULAIR Take 1 tablet (10 mg total) by mouth at bedtime as needed (allergies.).   pregabalin 100 MG capsule Commonly known as: LYRICA Take 100 mg by mouth at bedtime.   spironolactone 50 MG tablet Commonly known as: ALDACTONE TAKE 1 TABLET BY MOUTH EVERY DAY   traMADol HCl 100 MG Tabs Take 1 tablet by mouth 3 (three) times daily as needed.   Vitamin D (Ergocalciferol) 1.25 MG (50000 UNIT) Caps capsule Commonly known as: DRISDOL Take 1 capsule by mouth once a week.        Allergies: No Known Allergies  Family History: Family History  Problem Relation Age of Onset   COPD Mother    Cancer Mother    Hypertension Father    Congestive Heart Failure Father    COPD Father    Lung cancer Father    Heart attack Father 72   Hypertension Sister    Diabetes Paternal Grandfather    Heart attack Paternal Grandfather    Healthy Son    Healthy Daughter     Social History:  reports that he has never smoked. He has never used smokeless tobacco. He reports that he does not currently use alcohol. He reports that he does not use drugs.  ROS: Pertinent ROS in HPI  Physical Exam: BP 140/88   Pulse 69   Ht 5' 9"  (1.753 m)   Wt 295 lb (133.8 kg)   BMI 43.56 kg/m   Constitutional:  Well nourished. Alert and oriented, No acute distress. HEENT: [place] AT, mask in place.  Trachea midline Cardiovascular: No clubbing, cyanosis, or edema. Respiratory: Normal respiratory effort, no increased work of breathing. Neurologic: Grossly intact, no focal deficits, moving all 4 extremities. Psychiatric: Normal mood and affect.  Laboratory Data: Urinalysis Component     Latest Ref Rng & Units 02/27/2021  Specific Gravity, UA     1.005 - 1.030 1.025  pH, UA     5.0 - 7.5 5.5  Color, UA     Yellow Yellow  Appearance Ur     Clear Clear  Leukocytes,UA     Negative Negative  Protein,UA     Negative/Trace Negative   Glucose, UA     Negative Negative  Ketones, UA     Negative Negative  RBC, UA     Negative Negative  Bilirubin, UA     Negative Negative  Urobilinogen, Ur     0.2 - 1.0 mg/dL 0.2  Nitrite, UA     Negative Negative  Microscopic Examination      See below:   Component     Latest Ref Rng & Units 02/27/2021  WBC, UA     0 - 5 /hpf 0-5  RBC     0 - 2 /hpf 0-2  Epithelial Cells (non renal)  0 - 10 /hpf 0-10  Bacteria, UA     None seen/Few None seen    I have reviewed the labs.   Pertinent Imaging: CLINICAL DATA:  Kidney stones.   EXAM: ABDOMEN - 1 VIEW   COMPARISON:  Abdominal radiograph dated 10/19/2018.   FINDINGS: Evaluate station or evidence of obstruction. No free air or radiopaque calculi. Degenerative changes of the spine. The soft tissues are unremarkable.   IMPRESSION: Negative.     Electronically Signed   By: Anner Crete M.D.   On: 03/01/2021 01:30 I have independently reviewed the films.    Assessment & Plan:    1. Nephrolithiasis -no stones seen on KUB -RUS to rule out hydronephrosis  2. Microscopic hematuria -resolved   Return for I will call patient with results.  These notes generated with voice recognition software. I apologize for typographical errors.  Zara Council, PA-C  [facility] [address]  [place], [place] [address] [phone]

## 2021-02-27 ENCOUNTER — Ambulatory Visit
Admission: RE | Admit: 2021-02-27 | Discharge: 2021-02-27 | Disposition: A | Discharge status: Home / Self Care | Payer: Medicare Other | Attending: Urology | Admitting: Urology

## 2021-02-27 ENCOUNTER — Ambulatory Visit (INDEPENDENT_AMBULATORY_CARE_PROVIDER_SITE_OTHER): Payer: Medicare Other | Admitting: Urology

## 2021-02-27 ENCOUNTER — Other Ambulatory Visit: Payer: Self-pay

## 2021-02-27 ENCOUNTER — Encounter: Payer: Self-pay | Admitting: Urology

## 2021-02-27 ENCOUNTER — Ambulatory Visit
Admission: RE | Admit: 2021-02-27 | Discharge: 2021-02-27 | Disposition: A | Discharge status: Home / Self Care | Payer: Medicare Other | Source: Ambulatory Visit | Attending: Urology | Admitting: Urology

## 2021-02-27 VITALS — BP 140/88 | HR 69 | Ht 69.0 in | Wt 295.0 lb

## 2021-02-27 DIAGNOSIS — N200 Calculus of kidney: Secondary | ICD-10-CM

## 2021-02-27 DIAGNOSIS — N400 Enlarged prostate without lower urinary tract symptoms: Secondary | ICD-10-CM | POA: Insufficient documentation

## 2021-02-27 DIAGNOSIS — I2510 Atherosclerotic heart disease of native coronary artery without angina pectoris: Secondary | ICD-10-CM

## 2021-02-27 DIAGNOSIS — R3129 Other microscopic hematuria: Secondary | ICD-10-CM | POA: Diagnosis not present

## 2021-02-28 LAB — URINALYSIS, COMPLETE
Bilirubin, UA: NEGATIVE
Glucose, UA: NEGATIVE
Ketones, UA: NEGATIVE
Leukocytes,UA: NEGATIVE
Nitrite, UA: NEGATIVE
Protein,UA: NEGATIVE
RBC, UA: NEGATIVE
Specific Gravity, UA: 1.025 (ref 1.005–1.030)
Urobilinogen, Ur: 0.2 mg/dL (ref 0.2–1.0)
pH, UA: 5.5 (ref 5.0–7.5)

## 2021-02-28 LAB — MICROSCOPIC EXAMINATION: Bacteria, UA: NONE SEEN

## 2021-03-12 ENCOUNTER — Telehealth: Payer: Self-pay | Admitting: Urology

## 2021-03-12 NOTE — Telephone Encounter (Signed)
Would you check on the status of his RUS?  I put the orders in on 09/27 and it still hasn't been scheduled.

## 2021-03-16 ENCOUNTER — Other Ambulatory Visit: Payer: Self-pay

## 2021-03-16 ENCOUNTER — Encounter: Payer: Self-pay | Admitting: Emergency Medicine

## 2021-03-16 ENCOUNTER — Emergency Department: Payer: Medicare Other

## 2021-03-16 DIAGNOSIS — I2510 Atherosclerotic heart disease of native coronary artery without angina pectoris: Secondary | ICD-10-CM | POA: Insufficient documentation

## 2021-03-16 DIAGNOSIS — K5792 Diverticulitis of intestine, part unspecified, without perforation or abscess without bleeding: Secondary | ICD-10-CM | POA: Diagnosis not present

## 2021-03-16 DIAGNOSIS — R1010 Upper abdominal pain, unspecified: Secondary | ICD-10-CM | POA: Diagnosis present

## 2021-03-16 DIAGNOSIS — I5022 Chronic systolic (congestive) heart failure: Secondary | ICD-10-CM | POA: Insufficient documentation

## 2021-03-16 DIAGNOSIS — I110 Hypertensive heart disease with heart failure: Secondary | ICD-10-CM | POA: Insufficient documentation

## 2021-03-16 DIAGNOSIS — Z79899 Other long term (current) drug therapy: Secondary | ICD-10-CM | POA: Diagnosis not present

## 2021-03-16 LAB — COMPREHENSIVE METABOLIC PANEL
ALT: 28 U/L (ref 0–44)
AST: 22 U/L (ref 15–41)
Albumin: 4.2 g/dL (ref 3.5–5.0)
Alkaline Phosphatase: 87 U/L (ref 38–126)
Anion gap: 12 (ref 5–15)
BUN: 16 mg/dL (ref 6–20)
CO2: 27 mmol/L (ref 22–32)
Calcium: 9.5 mg/dL (ref 8.9–10.3)
Chloride: 95 mmol/L — ABNORMAL LOW (ref 98–111)
Creatinine, Ser: 1.22 mg/dL (ref 0.61–1.24)
GFR, Estimated: >60 mL/min (ref >60)
Glucose, Bld: 101 mg/dL — ABNORMAL HIGH (ref 70–99)
Potassium: 3.6 mmol/L (ref 3.5–5.1)
Sodium: 134 mmol/L — ABNORMAL LOW (ref 135–145)
Total Bilirubin: 0.9 mg/dL (ref 0.3–1.2)
Total Protein: 8.5 g/dL — ABNORMAL HIGH (ref 6.5–8.1)

## 2021-03-16 LAB — CBC WITH DIFFERENTIAL/PLATELET
Abs Immature Granulocytes: 0.06 10*3/uL (ref 0.00–0.07)
Basophils Absolute: 0.1 10*3/uL (ref 0.0–0.1)
Basophils Relative: 0 %
Eosinophils Absolute: 0 10*3/uL (ref 0.0–0.5)
Eosinophils Relative: 0 %
HCT: 48.8 % (ref 39.0–52.0)
Hemoglobin: 16.6 g/dL (ref 13.0–17.0)
Immature Granulocytes: 1 %
Lymphocytes Relative: 14 %
Lymphs Abs: 1.6 10*3/uL (ref 0.7–4.0)
MCH: 30.1 pg (ref 26.0–34.0)
MCHC: 34 g/dL (ref 30.0–36.0)
MCV: 88.4 fL (ref 80.0–100.0)
Monocytes Absolute: 1.1 10*3/uL — ABNORMAL HIGH (ref 0.1–1.0)
Monocytes Relative: 9 %
Neutro Abs: 8.5 10*3/uL — ABNORMAL HIGH (ref 1.7–7.7)
Neutrophils Relative %: 76 %
Platelets: 392 10*3/uL (ref 150–400)
RBC: 5.52 MIL/uL (ref 4.22–5.81)
RDW: 12 % (ref 11.5–15.5)
WBC: 11.3 10*3/uL — ABNORMAL HIGH (ref 4.0–10.5)
nRBC: 0 % (ref 0.0–0.2)

## 2021-03-16 LAB — URINALYSIS, COMPLETE (UACMP) WITH MICROSCOPIC
Bacteria, UA: NONE SEEN
Bilirubin Urine: NEGATIVE
Glucose, UA: NEGATIVE mg/dL
Ketones, ur: NEGATIVE mg/dL
Leukocytes,Ua: NEGATIVE
Nitrite: NEGATIVE
Protein, ur: NEGATIVE mg/dL
RBC / HPF: >50 RBC/hpf — ABNORMAL HIGH (ref 0–5)
Specific Gravity, Urine: 1.017 (ref 1.005–1.030)
pH: 6 (ref 5.0–8.0)

## 2021-03-16 LAB — LIPASE, BLOOD: Lipase: 31 U/L (ref 11–51)

## 2021-03-16 MED ORDER — IOHEXOL 350 MG/ML SOLN
100.0000 mL | Freq: Once | INTRAVENOUS | Status: AC | PRN
  Administered 2021-03-16: 100 mL via INTRAVENOUS

## 2021-03-16 NOTE — ED Triage Notes (Signed)
Pt reports that he is having abd pain and nausea for a month, it went away for 2 days then came back about 4 days ago. He took his medication that his GI MD prescribed for him but they have not helped. They told him if the pills did not work that he needed to come and be evaluated.

## 2021-03-16 NOTE — ED Provider Notes (Signed)
Emergency Medicine Provider Triage Evaluation Note  Aaron [name] , a 55 y.o. male  was evaluated in triage.  Pt complains of epigastric abdominal pain, right upper quadrant abdominal pain.  Patient presents with worsening abdominal pain.  He has seen GI, scheduled for both an endoscopy and colonoscopy in 2 weeks time.  Patient was informed that the pain is worsening to present to the ED.  Pain has worsened.  He states that last night he had some fever and body aches which he attributes to his flu shot and COVID booster.  No fever or body aches today.  He has had ongoing upper epigastric and right upper quadrant abdominal pain.  No emesis.  No diarrhea..  Patient does have his gallbladder and his appendix.  Patient states that he also has some right shoulder/flank pain.  He does have a history of kidney stones, states that he has passed 203 stones, is being followed by urology for kidney stones as well.  Review of Systems  Positive: Epigastric and right upper quadrant abdominal pain. Negative: Emesis, diarrhea.  Physical Exam  BP 126/76 (BP Location: Left Arm)   Pulse 85   Temp 98.1 F (36.7 C) (Oral)   Resp 20   SpO2 98%  Gen:   Awake, no distress   Resp:  Normal effort  MSK:   Moves extremities without difficulty  Other:  Patient is tender in the epigastric and right upper quadrant.  Slight tenderness in the right flank region as well.  Bowel sounds x4 quadrants.  Medical Decision Making  Medically screening exam initiated at 5:23 PM.  Appropriate orders placed.  Aaron [name] was informed that the remainder of the evaluation will be completed by another provider, this initial triage assessment does not replace that evaluation, and the importance of remaining in the ED until their evaluation is complete.  Patient presents with epigastric, right upper quadrant and right flank pain.  Patient is being followed by both urology as well as gastroenterology for ongoing symptoms of a kidney  stone.  Patient is scheduled for endoscopy and colonoscopy for her ongoing abdominal complaints..  At this time patient will have labs, urine, CT scan.   Darletta Moll, PA-C 03/16/21 Isaac Laud, MD 03/17/21 [phone]

## 2021-03-17 ENCOUNTER — Emergency Department
Admission: EM | Admit: 2021-03-17 | Discharge: 2021-03-17 | Disposition: A | Discharge status: Home / Self Care | Payer: Medicare Other | Attending: Emergency Medicine | Admitting: Emergency Medicine

## 2021-03-17 DIAGNOSIS — K5792 Diverticulitis of intestine, part unspecified, without perforation or abscess without bleeding: Secondary | ICD-10-CM

## 2021-03-17 DIAGNOSIS — R109 Unspecified abdominal pain: Secondary | ICD-10-CM

## 2021-03-17 MED ORDER — PIPERACILLIN-TAZOBACTAM 3.375 G IVPB 30 MIN
3.3750 g | Freq: Once | INTRAVENOUS | Status: AC
  Administered 2021-03-17: 3.375 g via INTRAVENOUS
  Filled 2021-03-17: qty 50

## 2021-03-17 MED ORDER — SODIUM CHLORIDE 0.9 % IV BOLUS
1000.0000 mL | Freq: Once | INTRAVENOUS | Status: AC
  Administered 2021-03-17: 1000 mL via INTRAVENOUS

## 2021-03-17 MED ORDER — ONDANSETRON 4 MG PO TBDP: 4.0000 mg | ORAL_TABLET | Freq: Three times a day (TID) | ORAL | 0 refills | Status: DC | PRN

## 2021-03-17 MED ORDER — MORPHINE SULFATE (PF) 4 MG/ML IV SOLN
4.0000 mg | Freq: Once | INTRAVENOUS | Status: AC
  Administered 2021-03-17: 4 mg via INTRAVENOUS
  Filled 2021-03-17: qty 1

## 2021-03-17 MED ORDER — ONDANSETRON HCL 4 MG/2ML IJ SOLN
4.0000 mg | Freq: Once | INTRAMUSCULAR | Status: AC
  Administered 2021-03-17: 4 mg via INTRAVENOUS
  Filled 2021-03-17: qty 2

## 2021-03-17 MED ORDER — AMOXICILLIN-POT CLAVULANATE 875-125 MG PO TABS: 1.0000 | ORAL_TABLET | Freq: Two times a day (BID) | ORAL | 0 refills | Status: DC

## 2021-03-17 MED ORDER — OXYCODONE-ACETAMINOPHEN 5-325 MG PO TABS
1.0000 | ORAL_TABLET | Freq: Once | ORAL | Status: AC
  Administered 2021-03-17: 1 via ORAL
  Filled 2021-03-17: qty 1

## 2021-03-17 MED ORDER — OXYCODONE-ACETAMINOPHEN 5-325 MG PO TABS: 1.0000 | ORAL_TABLET | ORAL | 0 refills | Status: AC | PRN

## 2021-03-17 NOTE — Discharge Instructions (Signed)
1.  Take antibiotic as prescribed (Augmentin 875 mg twice daily x7 days). 2.  You may take pain and nausea medicines as needed (Percocet/Zofran #20). 3.  Return to the ER for worsening symptoms, persistent vomiting, fever, difficulty breathing or other concerns.

## 2021-03-17 NOTE — ED Provider Notes (Signed)
[facility] Emergency Department Provider Note   ____________________________________________   Event Date/Time   First MD Initiated Contact with Patient 03/17/21 0150     (approximate)  I have reviewed the triage vital signs and the nursing notes.   HISTORY  Chief Complaint Abdominal Pain    HPI Aaron [name] is a 55 y.o. male who presents to the ED from home with a chief complaint of abdominal pain and nausea.  Patient reports upper abdominal pain and nausea x1 month.  Placed on acid reducers and Zofran by GI doctor and scheduled for EGD/colonoscopy.  Reports increased pain diffusely yesterday.  Denies associated fever, cough, chest pain, shortness of breath, vomiting, dysuria or diarrhea.     Past Medical History:  Diagnosis Date   Chronic back pain    disc   Coronary artery disease    Crohn's disease ([place])    HFrEF (heart failure with reduced ejection fraction) ([place])    Hypertension    Kidney stone    MDD (major depressive disorder)     Patient Active Problem List   Diagnosis Date Noted   Abdominal pain 02/10/2021   First degree heart block 02/10/2021   Nonischemic cardiomyopathy ([place]) 10/22/2020   PSVT (paroxysmal supraventricular tachycardia) ([place]) 10/22/2020   Cough 07/07/2020   Chronic HFrEF (heart failure with reduced ejection fraction) ([place]) 07/07/2020   CAD in native artery 07/07/2020   Obstructive sleep apnea 07/07/2020   Abnormal stress test 06/27/2020   Hypokalemia 05/18/2020   Elevated troponin 05/18/2020   Pneumonia due to COVID-19 virus 05/11/2020   Acute respiratory failure due to COVID-19 ([place]) 05/11/2020   Palpitations 03/31/2020   Abnormal EKG 03/31/2020   Shortness of breath 03/31/2020   Chest pain 12/17/2019   Amaurosis fugax 12/17/2019   MDD (major depressive disorder), single episode, moderate ([place]) 11/19/2018   Bereavement 11/19/2018   GAD (generalized anxiety disorder) 11/19/2018   Insomnia due to mental  condition 11/19/2018   ADD (attention deficit disorder) 09/02/2018   Essential hypertension 09/02/2018   Kidney stones 09/02/2018   Murmur 09/02/2018   Morbid obesity ([place]) 05/21/2018   Morbid obesity with BMI of 45.0-49.9, adult ([place]) 09/20/2015    Past Surgical History:  Procedure Laterality Date   CARDIAC CATHETERIZATION     KIDNEY STONE SURGERY     LEFT HEART CATH AND CORONARY ANGIOGRAPHY Left 06/27/2020   Procedure: LEFT HEART CATH AND CORONARY ANGIOGRAPHY;  Surgeon: Nelva Bush, MD;  Location: [facility];  Service: Cardiovascular;  Laterality: Left;   VASECTOMY      Prior to Admission medications   Medication Sig Start Date End Date Taking? Authorizing Provider  amoxicillin-clavulanate (AUGMENTIN) 875-125 MG tablet Take 1 tablet by mouth 2 (two) times daily. 03/17/21  Yes Paulette Blanch, MD  ondansetron (ZOFRAN ODT) 4 MG disintegrating tablet Take 1 tablet (4 mg total) by mouth every 8 (eight) hours as needed for nausea or vomiting. 03/17/21  Yes Paulette Blanch, MD  oxyCODONE-acetaminophen (PERCOCET/ROXICET) 5-325 MG tablet Take 1 tablet by mouth every 4 (four) hours as needed for severe pain. 03/17/21  Yes Paulette Blanch, MD  acetaminophen (TYLENOL) 325 MG tablet Take 650-975 mg by mouth every 6 (six) hours as needed for moderate pain or headache (for pain.).    [provider]  albuterol (VENTOLIN HFA) 108 (90 Base) MCG/ACT inhaler Inhale 2 puffs into the lungs every 6 (six) hours as needed for wheezing or shortness of breath. 09/05/20   Parrett,  Tammy S, NP  cetirizine (ZYRTEC) 10 MG tablet Take 1 tablet (10 mg total) by mouth daily as needed (allergies.). 05/12/20   Enzo Bi, MD  fluticasone (FLONASE) 50 MCG/ACT nasal spray Place 2 sprays into both nostrils daily as needed (allergies.). 05/12/20   Enzo Bi, MD  furosemide (LASIX) 40 MG tablet TAKE 1 TABLET BY MOUTH EVERY DAY 01/01/21   End, Harrell Gave, MD  ibuprofen (ADVIL) 200 MG tablet Take 600 mg by mouth  every 6 (six) hours as needed for headache.    [provider]  ipratropium (ATROVENT HFA) 17 MCG/ACT inhaler Inhale 2 puffs into the lungs every 6 (six) hours.    [provider]  losartan (COZAAR) 100 MG tablet Take 1 tablet (100 mg total) by mouth daily. 09/12/20 09/07/21  Loel Dubonnet, NP  metoprolol succinate (TOPROL-XL) 50 MG 24 hr tablet Take 1 tablet (50 mg total) by mouth daily. Take with or immediately following a meal. 11/22/20 02/27/21  Vickie Epley, MD  montelukast (SINGULAIR) 10 MG tablet Take 1 tablet (10 mg total) by mouth at bedtime as needed (allergies.). 05/12/20   Enzo Bi, MD  pregabalin (LYRICA) 100 MG capsule Take 100 mg by mouth at bedtime. 11/15/20   [provider]  spironolactone (ALDACTONE) 50 MG tablet TAKE 1 TABLET BY MOUTH EVERY DAY 02/06/21   End, Harrell Gave, MD  traMADol HCl 100 MG TABS Take 1 tablet by mouth 3 (three) times daily as needed. 11/15/20   [provider]  Vitamin D, Ergocalciferol, (DRISDOL) 1.25 MG (50000 UNIT) CAPS capsule Take 1 capsule by mouth once a week. 11/07/20   [provider]    Allergies Patient has no known allergies.  Family History  Problem Relation Age of Onset   COPD Mother    Cancer Mother    Hypertension Father    Congestive Heart Failure Father    COPD Father    Lung cancer Father    Heart attack Father 89   Hypertension Sister    Diabetes Paternal Grandfather    Heart attack Paternal Grandfather    Healthy Son    Healthy Daughter     Social History Social History   Tobacco Use   Smoking status: Never   Smokeless tobacco: Never  Vaping Use   Vaping Use: Never used  Substance Use Topics   Alcohol use: Not Currently   Drug use: Never    Review of Systems  Constitutional: No fever/chills Eyes: No visual changes. ENT: No sore throat. Cardiovascular: Denies chest pain. Respiratory: Denies shortness of breath. Gastrointestinal: Positive for abdominal pain.   Positive for nausea, no vomiting.  No diarrhea.  No constipation. Genitourinary: Negative for dysuria. Musculoskeletal: Negative for back pain. Skin: Negative for rash. Neurological: Negative for headaches, focal weakness or numbness.   ____________________________________________   PHYSICAL EXAM:  VITAL SIGNS: ED Triage Vitals  Enc Vitals Group     BP 03/16/21 1701 126/76     Pulse Rate 03/16/21 1701 85     Resp 03/16/21 1701 20     Temp 03/16/21 1701 98.1 F (36.7 C)     Temp Source 03/16/21 1701 Oral     SpO2 03/16/21 1701 98 %     Weight 03/16/21 1730 295 lb (133.8 kg)     Height 03/16/21 1730 5' 9"  (1.753 m)     Head Circumference --      Peak Flow --      Pain Score 03/16/21 1730 9  Pain Loc --      Pain Edu? --      Excl. in [place]? --     Constitutional: Alert and oriented. Well appearing and in mild acute distress. Eyes: Conjunctivae are normal. PERRL. EOMI. Head: Atraumatic. Nose: No congestion/rhinnorhea. Mouth/Throat: Mucous membranes are mildly dry. Neck: No stridor.   Cardiovascular: Normal rate, regular rhythm. Grossly normal heart sounds.  Good peripheral circulation. Respiratory: Normal respiratory effort.  No retractions. Lungs CTAB. Gastrointestinal: Soft with mild diffuse tenderness to palpation without rebound or guarding. No distention. No abdominal bruits. No CVA tenderness. Musculoskeletal: No lower extremity tenderness nor edema.  No joint effusions. Neurologic:  Normal speech and language. No gross focal neurologic deficits are appreciated. No gait instability. Skin:  Skin is warm, dry and intact. No rash noted. Psychiatric: Mood and affect are normal. Speech and behavior are normal.  ____________________________________________   LABS (all labs ordered are listed, but only abnormal results are displayed)  Labs Reviewed  COMPREHENSIVE METABOLIC PANEL - Abnormal; Notable for the following components:      Result Value   Sodium 134 (*)     Chloride 95 (*)    Glucose, Bld 101 (*)    Total Protein 8.5 (*)    All other components within normal limits  CBC WITH DIFFERENTIAL/PLATELET - Abnormal; Notable for the following components:   WBC 11.3 (*)    Neutro Abs 8.5 (*)    Monocytes Absolute 1.1 (*)    All other components within normal limits  URINALYSIS, COMPLETE (UACMP) WITH MICROSCOPIC - Abnormal; Notable for the following components:   Color, Urine YELLOW (*)    APPearance CLEAR (*)    Hgb urine dipstick MODERATE (*)    RBC / HPF >50 (*)    All other components within normal limits  URINE CULTURE  LIPASE, BLOOD   ____________________________________________  EKG  None ____________________________________________  RADIOLOGY I, Lisa Milian J, personally viewed and evaluated these images (plain radiographs) as part of my medical decision making, as well as reviewing the written report by the radiologist.  ED MD interpretation: Diverticulitis  Official radiology report(s): CT ABDOMEN PELVIS W CONTRAST  Result Date: 03/16/2021 CLINICAL DATA:  Epigastric and right upper quadrant abdominal pain. EXAM: CT ABDOMEN AND PELVIS WITH CONTRAST TECHNIQUE: Multidetector CT imaging of the abdomen and pelvis was performed using the standard protocol following bolus administration of intravenous contrast. CONTRAST:  19m OMNIPAQUE IOHEXOL 350 MG/ML SOLN COMPARISON:  CT Oct 23, 2018 FINDINGS: Lower chest: No acute abnormality. Hepatobiliary: No suspicious hepatic lesion. Gallbladder is unremarkable. No biliary ductal dilation. Pancreas: Unremarkable. No pancreatic ductal dilatation or surrounding inflammatory changes. Spleen: Normal in size without focal abnormality. Adrenals/Urinary Tract: Benign 1.4 cm left adrenal myelolipoma. Right adrenal glands unremarkable. No hydronephrosis. Nonobstructive 2-3 mm right upper pole renal stone. Subtle area of hypoenhancement along the interpolar region of the right kidney with some adjacent fat  stranding which may reflect focal pyelonephritis. No obstructive ureteral or bladder calculi visualized. Largest left renal cyst. Hypodense subcentimeter bilateral renal lesions which are technically too small to accurately characterize but statistically likely represent cysts. Urinary bladder is unremarkable for degree of distension. Stomach/Bowel: Stomach is unremarkable for degree of distension. No pathologic dilation of small or large bowel. The appendix and terminal ileum appear normal. Colonic diverticulosis with a mildly inflamed sigmoid colonic diverticulum on image 41/5. Vascular/Lymphatic: No significant vascular findings are present. No pathologically enlarged abdominal or pelvic lymph nodes. Reproductive: Dystrophic prostatic calcifications. Other: Fat containing bilateral inguinal hernias.  No abdominopelvic ascites. No pneumoperitoneum. No portal venous gas. No walled off fluid collections. 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 Multilevel degenerative changes spine. No acute osseous abnormality. IMPRESSION: 1. Colonic diverticulosis with mild acute uncomplicated sigmoid diverticulitis. 2. Subtle area of hypoenhancement along the interpolar region of the right kidney with some adjacent fat stranding which may reflect focal pyelonephritis. Correlation with urinalysis recommended. 3. Nonobstructive right nephrolithiasis. No obstructive ureteral or bladder calculi visualized. 4. Fat containing bilateral inguinal hernias. Electronically Signed   By: Dahlia Bailiff M.D.   On: 03/16/2021 20:05    ____________________________________________   PROCEDURES  Procedure(s) performed (including Critical Care):  Procedures   ____________________________________________   INITIAL IMPRESSION / ASSESSMENT AND PLAN / ED COURSE  As part of my medical decision making, I reviewed the following data within the [place] History obtained from family, Nursing notes  reviewed and incorporated, Labs reviewed, Old chart reviewed, Radiograph reviewed, Notes from prior ED visits, and [place] Controlled Substance Database     55 year old male presenting with abdominal pain and nausea. Differential diagnosis includes, but is not limited to, acute appendicitis, renal colic, testicular torsion, urinary tract infection/pyelonephritis, prostatitis,  epididymitis, diverticulitis, small bowel obstruction or ileus, colitis, abdominal aortic aneurysm, gastroenteritis, hernia, etc.   Laboratory results unremarkable.  CT demonstrates uncomplicated diverticulitis.  Will administer IV fluids, IV analgesia, antiemetic, antibiotic.  Will reassess.  Clinical Course as of 03/17/21 0602  Sat Mar 17, 2021  0447 Patient feeling better.  IV fluids and antibiotics completed.  Will discharge home on Augmentin, Percocet and Zofran to use as needed.  He will follow-up with his GI doctor.  Strict return precautions given.  Patient and spouse verbalized understanding agree with plan of care. [JS]    Clinical Course User Index [JS] Paulette Blanch, MD     ____________________________________________   FINAL CLINICAL IMPRESSION(S) / ED DIAGNOSES  Final diagnoses:  Abdominal pain, unspecified abdominal location  Diverticulitis     ED Discharge Orders          Ordered    amoxicillin-clavulanate (AUGMENTIN) 875-125 MG tablet  2 times daily        03/17/21 0227    oxyCODONE-acetaminophen (PERCOCET/ROXICET) 5-325 MG tablet  Every 4 hours PRN        03/17/21 0227    ondansetron (ZOFRAN ODT) 4 MG disintegrating tablet  Every 8 hours PRN        03/17/21 0227             Note:  This document was prepared using Dragon voice recognition software and may include unintentional dictation errors.    Paulette Blanch, MD 03/17/21 [phone]

## 2021-03-18 LAB — URINE CULTURE: Culture: <10000 — AB

## 2021-03-22 ENCOUNTER — Ambulatory Visit: Payer: Medicare Other

## 2021-03-23 ENCOUNTER — Other Ambulatory Visit: Payer: Medicare Other

## 2021-03-26 NOTE — Progress Notes (Signed)
03/27/2021 1:10 PM   Aaron [name] Nov 19, 1965 [id number]  Referring provider: [facility] [address] [place],  [place] [address]  Chief Complaint  Patient presents with   Hematuria    Urological history: 1. BPH with LU TS -PSA 0.8 in 02/2021  2. Nephrolithiasis -surgery for kidney stones about 20 years ago -5 incidences of kidney stones -KUB no stone seen   3. Intermediate Risk Hematuria -non-smoker -CT abdomen and pelvis noncontrast on 01/02/2021 that revealed an enlarged prostate gland measuring 5.0 cm in transverse diameter with focal calcifications. It also revealed 5 mm proximal left ureteral calculus without significant ureterectasis or pyelocaliectasis, a 5 mm nonobstructive right upper pole renal calculus and a 7.25 cm exophytic left upper pole renal cyst -no reports of gross heme  -UA negative micro heme  4. Left renal cyst - seen on previous imaging, stable and benign; no further evaluation needed.  - MRI in 2019 for further characterization was benign   HPI: Aaron [name] is a 55 y.o. male who presents today for a two week follow up.  He was seen in the ED since his last appointment and diagnosed with diverticulitis.  Contrast CT 03/16/2021 Nonobstructive 2-3 mm right upper pole renal stone. Subtle area of hypoenhancement along the interpolar region of the right kidney with some adjacent fat stranding which may reflect focal pyelonephritis. No obstructive ureteral or bladder calculi visualized. Largest left renal cyst. Hypodense subcentimeter bilateral renal lesions which are technically too small to accurately characterize but statistically likely represent cysts. Urinary bladder is unremarkable for degree of distension.  Dystrophic prostatic calcifications  His UA in the ED > 50 RBC's.    He has no urinary complaints at this visit.  Patient denies any modifying or aggravating factors.  Patient denies any gross hematuria, dysuria or  suprapubic/flank pain.  Patient denies any fevers, chills, nausea or vomiting.    UA today negative.    PMH: Past Medical History:  Diagnosis Date   Chronic back pain    disc   Coronary artery disease    Crohn's disease (HCC)    HFrEF (heart failure with reduced ejection fraction) (HCC)    Hypertension    Kidney stone    MDD (major depressive disorder)     Surgical History: Past Surgical History:  Procedure Laterality Date   CARDIAC CATHETERIZATION     KIDNEY STONE SURGERY     LEFT HEART CATH AND CORONARY ANGIOGRAPHY Left 06/27/2020   Procedure: LEFT HEART CATH AND CORONARY ANGIOGRAPHY;  Surgeon: Nelva Bush, MD;  Location: [facility];  Service: Cardiovascular;  Laterality: Left;   VASECTOMY      Home Medications:  Allergies as of 03/27/2021   No Known Allergies      Medication List        Accurate as of March 27, 2021 11:59 PM. If you have any questions, ask your nurse or doctor.          STOP taking these medications    ibuprofen 200 MG tablet Commonly known as: ADVIL Stopped by: Cylus Douville, PA-C   ondansetron 4 MG disintegrating tablet Commonly known as: Zofran ODT Stopped by: Zara Council, PA-C       TAKE these medications    acetaminophen 325 MG tablet Commonly known as: TYLENOL Take 650-975 mg by mouth every 6 (six) hours as needed for moderate pain or headache (for pain.).   albuterol 108 (90 Base) MCG/ACT inhaler Commonly known as: VENTOLIN HFA  Inhale 2 puffs into the lungs every 6 (six) hours as needed for wheezing or shortness of breath.   amoxicillin-clavulanate 875-125 MG tablet Commonly known as: Augmentin Take 1 tablet by mouth 2 (two) times daily.   cetirizine 10 MG tablet Commonly known as: ZYRTEC Take 1 tablet (10 mg total) by mouth daily as needed (allergies.).   fluticasone 50 MCG/ACT nasal spray Commonly known as: FLONASE Place 2 sprays into both nostrils daily as needed (allergies.).    furosemide 40 MG tablet Commonly known as: LASIX TAKE 1 TABLET BY MOUTH EVERY DAY   ipratropium 17 MCG/ACT inhaler Commonly known as: ATROVENT HFA Inhale 2 puffs into the lungs every 6 (six) hours.   losartan 100 MG tablet Commonly known as: COZAAR Take 1 tablet (100 mg total) by mouth daily.   metoprolol succinate 50 MG 24 hr tablet Commonly known as: TOPROL-XL Take 1 tablet (50 mg total) by mouth daily. Take with or immediately following a meal.   montelukast 10 MG tablet Commonly known as: SINGULAIR Take 1 tablet (10 mg total) by mouth at bedtime as needed (allergies.).   omeprazole 40 MG capsule Commonly known as: PRILOSEC Take by mouth.   oxyCODONE-acetaminophen 5-325 MG tablet Commonly known as: PERCOCET/ROXICET Take 1 tablet by mouth every 4 (four) hours as needed for severe pain.   pregabalin 100 MG capsule Commonly known as: LYRICA Take 100 mg by mouth at bedtime.   spironolactone 50 MG tablet Commonly known as: ALDACTONE TAKE 1 TABLET BY MOUTH EVERY DAY   traMADol HCl 100 MG Tabs Take 1 tablet by mouth 3 (three) times daily as needed.   Vitamin D (Ergocalciferol) 1.25 MG (50000 UNIT) Caps capsule Commonly known as: DRISDOL Take 1 capsule by mouth once a week.        Allergies: No Known Allergies  Family History: Family History  Problem Relation Age of Onset   COPD Mother    Cancer Mother    Hypertension Father    Congestive Heart Failure Father    COPD Father    Lung cancer Father    Heart attack Father 81   Hypertension Sister    Diabetes Paternal Grandfather    Heart attack Paternal Grandfather    Healthy Son    Healthy Daughter     Social History:  reports that he has never smoked. He has never used smokeless tobacco. He reports that he does not currently use alcohol. He reports that he does not use drugs.  ROS: Pertinent ROS in HPI  Physical Exam: BP 120/84   Pulse 62   Ht 5' 9"  (1.753 m)   Wt 290 lb (131.5 kg)   BMI 42.83  kg/m   Constitutional:  Well nourished. Alert and oriented, No acute distress. HEENT: [place] AT, mask in place.  Trachea midline Cardiovascular: No clubbing, cyanosis, or edema. Respiratory: Normal respiratory effort, no increased work of breathing. Neurologic: Grossly intact, no focal deficits, moving all 4 extremities. Psychiatric: Normal mood and affect.   Laboratory Data: Component     Latest Ref Rng & Units 03/16/2021  WBC     4.0 - 10.5 K/uL 11.3 (H)  RBC     4.22 - 5.81 MIL/uL 5.52  Hemoglobin     13.0 - 17.0 g/dL 16.6  HCT     39.0 - 52.0 % 48.8  MCV     80.0 - 100.0 fL 88.4  MCH     26.0 - 34.0 pg 30.1  MCHC  30.0 - 36.0 g/dL 34.0  RDW     11.5 - 15.5 % 12.0  Platelets     150 - 400 K/uL 392  nRBC     0.0 - 0.2 % 0.0  Neutrophils     % 76  NEUT#     1.7 - 7.7 K/uL 8.5 (H)  Lymphocytes     % 14  Lymphocyte #     0.7 - 4.0 K/uL 1.6  Monocytes Relative     % 9  Monocyte #     0.1 - 1.0 K/uL 1.1 (H)  Eosinophil     % 0  Eosinophils Absolute     0.0 - 0.5 K/uL 0.0  Basophil     % 0  Basophils Absolute     0.0 - 0.1 K/uL 0.1  Immature Granulocytes     % 1  Abs Immature Granulocytes     0.00 - 0.07 K/uL 0.06  Lymphs     Not Estab. %   Monocytes     Not Estab. %   Eos     Not Estab. %   Basos     Not Estab. %   Monocytes Absolute     0.1 - 0.9 x10E3/uL   EOS (ABSOLUTE)     0.0 - 0.4 x10E3/uL   Immature Grans (Abs)     0.0 - 0.1 x10E3/uL   WBC, UA     0 - 5 /hpf   Epithelial Cells (non renal)     0 - 10 /hpf   Bacteria, UA     None seen/Few    Component     Latest Ref Rng & Units 03/16/2021  Sodium     135 - 145 mmol/L 134 (L)  Potassium     3.5 - 5.1 mmol/L 3.6  Chloride     98 - 111 mmol/L 95 (L)  CO2     22 - 32 mmol/L 27  Glucose     70 - 99 mg/dL 101 (H)  BUN     6 - 20 mg/dL 16  Creatinine     0.61 - 1.24 mg/dL 1.22  Calcium     8.9 - 10.3 mg/dL 9.5  Total Protein     6.5 - 8.1 g/dL 8.5 (H)  Albumin     3.5 - 5.0  g/dL 4.2  AST     15 - 41 U/L 22  ALT     0 - 44 U/L 28  Alkaline Phosphatase     38 - 126 U/L 87  Total Bilirubin     0.3 - 1.2 mg/dL 0.9  GFR, Estimated     >60 mL/min >60  Anion gap     5 - 15 12   Component     Latest Ref Rng & Units 03/16/2021          Specimen Description      URINE, RANDOM . Marland Kitchen Marland Kitchen  Special Requests      NONE . Marland Kitchen .  Culture      <10,000 COLONIES/mL INSIGNIFICANT GROWTH (A) . . .  Report Status      03/18/2021 FINAL   Component     Latest Ref Rng & Units 03/16/2021  Color, Urine     YELLOW YELLOW (A)  Appearance     CLEAR CLEAR (A)  Specific Gravity, Urine     1.005 - 1.030 1.017  pH     5.0 - 8.0 6.0  Glucose, UA  NEGATIVE mg/dL NEGATIVE  Hgb urine dipstick     NEGATIVE MODERATE (A)  Bilirubin Urine     NEGATIVE NEGATIVE  Ketones, ur     NEGATIVE mg/dL NEGATIVE  Protein     NEGATIVE mg/dL NEGATIVE  Nitrite     NEGATIVE NEGATIVE  Leukocytes,Ua     NEGATIVE NEGATIVE  RBC / HPF     0 - 5 RBC/hpf >50 (H)  WBC, UA     0 - 5 WBC/hpf 0-5  Bacteria, UA     NONE SEEN NONE SEEN  Squamous Epithelial / LPF     0 - 5 0-5  Mucus      PRESENT  Hyaline Casts, UA      PRESENT  Urinalysis Component     Latest Ref Rng & Units 03/27/2021  Specific Gravity, UA     1.005 - 1.030 1.025  pH, UA     5.0 - 7.5 5.5  Color, UA     Yellow Yellow  Appearance Ur     Clear Clear  Leukocytes,UA     Negative Negative  Protein,UA     Negative/Trace Negative  Glucose, UA     Negative Negative  Ketones, UA     Negative Negative  RBC, UA     Negative 3+ (A)  Bilirubin, UA     Negative Negative  Urobilinogen, Ur     0.2 - 1.0 mg/dL 0.2  Nitrite, UA     Negative Negative  Microscopic Examination      See below:   Component     Latest Ref Rng & Units 03/27/2021  WBC, UA     0 - 5 /hpf 0-5  RBC     0 - 2 /hpf 0-2  Epithelial Cells (non renal)     0 - 10 /hpf 0-10  Mucus, UA     Not Estab. Present (A)  Bacteria, UA     None seen/Few  None seen  I have reviewed the labs.   Pertinent Imaging: CLINICAL DATA:  Epigastric and right upper quadrant abdominal pain.   EXAM: CT ABDOMEN AND PELVIS WITH CONTRAST   TECHNIQUE: Multidetector CT imaging of the abdomen and pelvis was performed using the standard protocol following bolus administration of intravenous contrast.   CONTRAST:  158m OMNIPAQUE IOHEXOL 350 MG/ML SOLN   COMPARISON:  CT Oct 23, 2018   FINDINGS: Lower chest: No acute abnormality.   Hepatobiliary: No suspicious hepatic lesion. Gallbladder is unremarkable. No biliary ductal dilation.   Pancreas: Unremarkable. No pancreatic ductal dilatation or surrounding inflammatory changes.   Spleen: Normal in size without focal abnormality.   Adrenals/Urinary Tract: Benign 1.4 cm left adrenal myelolipoma. Right adrenal glands unremarkable.   No hydronephrosis. Nonobstructive 2-3 mm right upper pole renal stone. Subtle area of hypoenhancement along the interpolar region of the right kidney with some adjacent fat stranding which may reflect focal pyelonephritis. No obstructive ureteral or bladder calculi visualized. Largest left renal cyst. Hypodense subcentimeter bilateral renal lesions which are technically too small to accurately characterize but statistically likely represent cysts. Urinary bladder is unremarkable for degree of distension.   Stomach/Bowel: Stomach is unremarkable for degree of distension. No pathologic dilation of small or large bowel. The appendix and terminal ileum appear normal. Colonic diverticulosis with a mildly inflamed sigmoid colonic diverticulum on image 41/5.   Vascular/Lymphatic: No significant vascular findings are present. No pathologically enlarged abdominal or pelvic lymph nodes.   Reproductive: Dystrophic prostatic calcifications.   Other: Fat containing  bilateral inguinal hernias. No abdominopelvic ascites. No pneumoperitoneum. No portal venous gas. No walled  off fluid collections.   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 Multilevel degenerative changes spine. No acute osseous abnormality.   IMPRESSION: 1. Colonic diverticulosis with mild acute uncomplicated sigmoid diverticulitis. 2. Subtle area of hypoenhancement along the interpolar region of the right kidney with some adjacent fat stranding which may reflect focal pyelonephritis. Correlation with urinalysis recommended. 3. Nonobstructive right nephrolithiasis. No obstructive ureteral or bladder calculi visualized. 4. Fat containing bilateral inguinal hernias.     Electronically Signed   By: Dahlia Bailiff M.D.   On: 03/16/2021 20:05 I have independently reviewed the films.    Assessment & Plan:    1. Nephrolithiasis -no stones seen on KUB -RUS to rule out hydronephrosis  2. Microscopic hematuria -resolved   Return in about 6 months (around 09/25/2021) for UA and office visit .  These notes generated with voice recognition software. I apologize for typographical errors.  Zara Council, PA-C  [facility] [address]  [place], [place] [address] [phone]

## 2021-03-27 ENCOUNTER — Ambulatory Visit (INDEPENDENT_AMBULATORY_CARE_PROVIDER_SITE_OTHER): Payer: Medicare Other | Admitting: Urology

## 2021-03-27 ENCOUNTER — Other Ambulatory Visit: Payer: Self-pay

## 2021-03-27 ENCOUNTER — Encounter: Payer: Self-pay | Admitting: Urology

## 2021-03-27 VITALS — BP 120/84 | HR 62 | Ht 69.0 in | Wt 290.0 lb

## 2021-03-27 DIAGNOSIS — I2510 Atherosclerotic heart disease of native coronary artery without angina pectoris: Secondary | ICD-10-CM

## 2021-03-27 DIAGNOSIS — R3129 Other microscopic hematuria: Secondary | ICD-10-CM

## 2021-03-27 LAB — MICROSCOPIC EXAMINATION: Bacteria, UA: NONE SEEN

## 2021-03-27 LAB — URINALYSIS, COMPLETE
Bilirubin, UA: NEGATIVE
Glucose, UA: NEGATIVE
Ketones, UA: NEGATIVE
Leukocytes,UA: NEGATIVE
Nitrite, UA: NEGATIVE
Protein,UA: NEGATIVE
Specific Gravity, UA: 1.025 (ref 1.005–1.030)
Urobilinogen, Ur: 0.2 mg/dL (ref 0.2–1.0)
pH, UA: 5.5 (ref 5.0–7.5)

## 2021-03-31 ENCOUNTER — Other Ambulatory Visit: Payer: Self-pay | Admitting: Internal Medicine

## 2021-04-12 ENCOUNTER — Other Ambulatory Visit: Payer: Self-pay | Admitting: Gastroenterology

## 2021-04-12 DIAGNOSIS — K5732 Diverticulitis of large intestine without perforation or abscess without bleeding: Secondary | ICD-10-CM

## 2021-04-30 DIAGNOSIS — S62002A Unspecified fracture of navicular [scaphoid] bone of left wrist, initial encounter for closed fracture: Secondary | ICD-10-CM | POA: Insufficient documentation

## 2021-05-07 ENCOUNTER — Ambulatory Visit
Admission: RE | Admit: 2021-05-07 | Discharge: 2021-05-07 | Disposition: A | Discharge status: Home / Self Care | Payer: Medicare Other | Source: Ambulatory Visit | Attending: Gastroenterology | Admitting: Gastroenterology

## 2021-05-07 ENCOUNTER — Other Ambulatory Visit: Payer: Self-pay

## 2021-05-07 DIAGNOSIS — K5732 Diverticulitis of large intestine without perforation or abscess without bleeding: Secondary | ICD-10-CM | POA: Insufficient documentation

## 2021-05-07 LAB — POCT I-STAT CREATININE: Creatinine, Ser: 1.1 mg/dL (ref 0.61–1.24)

## 2021-05-07 MED ORDER — IOHEXOL 350 MG/ML SOLN
100.0000 mL | Freq: Once | INTRAVENOUS | Status: AC | PRN
  Administered 2021-05-07: 100 mL via INTRAVENOUS

## 2021-06-11 ENCOUNTER — Encounter: Payer: Self-pay | Admitting: Medical

## 2021-06-11 ENCOUNTER — Other Ambulatory Visit: Payer: Self-pay

## 2021-06-11 ENCOUNTER — Ambulatory Visit (INDEPENDENT_AMBULATORY_CARE_PROVIDER_SITE_OTHER): Payer: Medicare Other | Admitting: Medical

## 2021-06-11 VITALS — BP 118/76 | HR 66 | Ht 69.0 in | Wt 291.0 lb

## 2021-06-11 DIAGNOSIS — R079 Chest pain, unspecified: Secondary | ICD-10-CM

## 2021-06-11 DIAGNOSIS — I440 Atrioventricular block, first degree: Secondary | ICD-10-CM | POA: Diagnosis not present

## 2021-06-11 DIAGNOSIS — I5022 Chronic systolic (congestive) heart failure: Secondary | ICD-10-CM | POA: Diagnosis not present

## 2021-06-11 DIAGNOSIS — R002 Palpitations: Secondary | ICD-10-CM | POA: Diagnosis not present

## 2021-06-11 DIAGNOSIS — I4940 Unspecified premature depolarization: Secondary | ICD-10-CM

## 2021-06-11 NOTE — Patient Instructions (Signed)
Medication Instructions:  Your physician has recommended you make the following change in your medication:   RESUME taking metoprolol succinate (Toprol XL) 50 mg daily   *If you need a refill on your cardiac medications before your next appointment, please call your pharmacy*   Lab Work:  Drawn today: TSH, magnesium, BMET, CBC  If you have labs (blood work) drawn today and your tests are completely normal, you will receive your results only by: [facility] (if you have MyChart) OR A paper copy in the mail If you have any lab test that is abnormal or we need to change your treatment, we will call you to review the results.   Testing/Procedures:  Your physician has recommended that you wear a Zio monitor for 14 days, this will be sent to your home. This monitor is a medical device that records the hearts electrical activity. Doctors most often use these monitors to diagnose arrhythmias. Arrhythmias are problems with the speed or rhythm of the heartbeat. The monitor is a small device applied to your chest. You can wear one while you do your normal daily activities. While wearing this monitor if you have any symptoms to push the button and record what you felt. Once you have worn this monitor for the period of time provider prescribed (Usually 14 days), you will return the monitor device in the postage paid box. Once it is returned they will download the data collected and provide [place] with a report which the provider will then review and we will call you with those results. Important tips:  Avoid showering during the first 24 hours of wearing the monitor. Avoid excessive sweating to help maximize wear time. Do not submerge the device, no hot tubs, and no swimming pools. Keep any lotions or oils away from the patch. After 24 hours you may shower with the patch on. Take brief showers with your back facing the shower head.  Do not remove patch once it has been placed because that will  interrupt data and decrease adhesive wear time. Push the button when you have any symptoms and write down what you were feeling. Once you have completed wearing your monitor, remove and place into box which has postage paid and place in your outgoing mailbox.  If for some reason you have misplaced your box then call our office and we can provide another box and/or mail it off for you.        Follow-Up: At [facility], you and your health needs are our priority.  As part of our continuing mission to provide you with exceptional heart care, we have created designated Provider Care Teams.  These Care Teams include your primary Cardiologist (physician) and Advanced Practice Providers (APPs -  Physician Assistants and Nurse Practitioners) who all work together to provide you with the care you need, when you need it.  We recommend signing up for the patient portal called "MyChart".  Sign up information is provided on this After Visit Summary.  MyChart is used to connect with patients for Virtual Visits (Telemedicine).  Patients are able to view lab/test results, encounter notes, upcoming appointments, etc.  Non-urgent messages can be sent to your provider as well.   To learn more about what you can do with MyChart, go to [email or web address].    Your next appointment:   6-8 week(s)  The format for your next appointment:   In Person  Provider:   You may see Nelva Bush, MD or one of the following  Advanced Practice Providers on your designated Care Team:   Murray Hodgkins, NP Christell Faith, PA-C Cadence Kathlen Mody, PA-C  :1}    Other Instructions N/A

## 2021-06-11 NOTE — Progress Notes (Signed)
Cardiology Office Note:    Date:  06/11/2021   ID:  Aaron [name], DOB [birth year], MRN [id number]  PCP:  [facility] Cardiologist:  Nelva Bush, MD  [facility] Electrophysiologist:  None   Referring MD: [facility]   Chief Complaint: 4 month follow-up  History of Present Illness:    Aaron [name] is a 56 y.o. male with a hx of mild, nonobstructive CAD, nonischemic cardiomyopathy (LVEF 40% by echo 06/2020, HTN, kidney stones, morbid obesity, OSA and COVID-19 infection 05/2020 who presents for follow-up.   Saw EP prior for frequent PACs and 1st degree AV block. NO medication changes were made.   Last seen 02/09/21 and was feeling better. He had continued episodic chest and abdominal pain, recent GI illness, and planning to follow-up with GI. He was euvolemic on exam, no other changes were made.   ED visit for abdominal pain for kidney stone. GI planning EGD/colonoscopy. CT showed uncomplicated diverticulitis.   Today, the patient patient reports he is still having some abdominal issues, plan for EGD and colonoscopy Thursday. Pain is better. Says his heart rate races when he eats a lot. Can get up to 120s. Heart can race for about an hour. He has to sit and relax and drink water before it can settle down. This occurs every time he eats. No dizziness, lightheadedness, chest pain, shortness of breath. No LLE, orthopnea, pnd. He is only taking half of Toprol-XL 50mg  and half of spironolactone.   Past Medical History:  Diagnosis Date   Chronic back pain    disc   Coronary artery disease    Crohn's disease (HCC)    HFrEF (heart failure with reduced ejection fraction) ([place])    Hypertension    Kidney stone    MDD (major depressive disorder)     Past Surgical History:  Procedure Laterality Date   CARDIAC CATHETERIZATION     KIDNEY STONE SURGERY     LEFT HEART CATH AND CORONARY ANGIOGRAPHY Left 06/27/2020   Procedure: LEFT HEART  CATH AND CORONARY ANGIOGRAPHY;  Surgeon: Nelva Bush, MD;  Location: [facility];  Service: Cardiovascular;  Laterality: Left;   VASECTOMY      Current Medications: Current Meds  Medication Sig   acetaminophen (TYLENOL) 325 MG tablet Take 650-975 mg by mouth every 6 (six) hours as needed for moderate pain or headache (for pain.).   albuterol (VENTOLIN HFA) 108 (90 Base) MCG/ACT inhaler Inhale 2 puffs into the lungs every 6 (six) hours as needed for wheezing or shortness of breath.   cetirizine (ZYRTEC) 10 MG tablet Take 1 tablet (10 mg total) by mouth daily as needed (allergies.).   fluticasone (FLONASE) 50 MCG/ACT nasal spray Place 2 sprays into both nostrils daily as needed (allergies.).   furosemide (LASIX) 40 MG tablet TAKE 1 TABLET BY MOUTH EVERY DAY   ipratropium (ATROVENT HFA) 17 MCG/ACT inhaler Inhale 2 puffs into the lungs every 6 (six) hours.   losartan (COZAAR) 100 MG tablet Take 1 tablet (100 mg total) by mouth daily.   metoprolol succinate (TOPROL-XL) 50 MG 24 hr tablet Take 1 tablet (50 mg total) by mouth daily. Take with or immediately following a meal.   montelukast (SINGULAIR) 10 MG tablet Take 1 tablet (10 mg total) by mouth at bedtime as needed (allergies.).   oxyCODONE-acetaminophen (PERCOCET/ROXICET) 5-325 MG tablet Take 1 tablet by mouth every 4 (four) hours as needed for severe pain.   pregabalin (LYRICA) 100 MG  capsule Take 100 mg by mouth at bedtime.   spironolactone (ALDACTONE) 50 MG tablet TAKE 1 TABLET BY MOUTH EVERY DAY   Vitamin D, Ergocalciferol, (DRISDOL) 1.25 MG (50000 UNIT) CAPS capsule Take 1 capsule by mouth once a week.     Allergies:   Patient has no known allergies.   Social History   Socioeconomic History   Marital status: Widowed    Spouse name: carisa   Number of children: 2   Years of education: Not on file   Highest education level: High school graduate  Occupational History   Not on file  Tobacco Use   Smoking status: Never    Smokeless tobacco: Never  Vaping Use   Vaping Use: Never used  Substance and Sexual Activity   Alcohol use: Not Currently   Drug use: Never   Sexual activity: Not Currently  Other Topics Concern   Not on file  Social History Narrative   Not on file   Social Determinants of Health   Financial Resource Strain: Not on file  Food Insecurity: Not on file  Transportation Needs: Not on file  Physical Activity: Not on file  Stress: Not on file  Social Connections: Not on file     Family History: The patient's family history includes COPD in his father and mother; Cancer in his mother; Congestive Heart Failure in his father; Diabetes in his paternal grandfather; Healthy in his daughter and son; Heart attack in his paternal grandfather; Heart attack (age of onset: 31) in his father; Hypertension in his father and sister; Lung cancer in his father.  ROS:   Please see the history of present illness.     All other systems reviewed and are negative.  EKGs/Labs/Other Studies Reviewed:    The following studies were reviewed today:  Echo 01/2021   1. Left ventricular ejection fraction, by estimation, is 55 to 60%. The  left ventricle has normal function. The left ventricle has no regional  wall motion abnormalities. Left ventricular diastolic parameters are  consistent with Grade I diastolic  dysfunction (impaired relaxation).   2. Right ventricular systolic function is normal. The right ventricular  size is normal.   3. The mitral valve is grossly normal. Mild mitral valve regurgitation.   4. The aortic valve was not well visualized. Aortic valve regurgitation  is not visualized.   5. Aortic dilatation noted. There is mild dilatation of the aortic root,  measuring 42 mm.   6. The inferior vena cava is normal in size with greater than 50%  respiratory variability, suggesting right atrial pressure of 3 mmHg.   LHC 06/2020 Conclusions: Mild, non-obstructive coronary artery  disease. Moderately reduced left ventricular systolic function (LVEF ~58%) with mildly elevated filling pressure (15-20 mmHg).   Recommendations: Primary prevention of coronary artery disease. Escalate goal-directed medical therapy for non-ischemic cardiomyopathy.  I will add spironolactone 25 mg daily with BMP within 1 week. Follow-up in office in ~2 weeks.   Nelva Bush, MD [facility]  EKG:  EKG is  ordered today.  The ekg ordered today demonstrates NSR, 66bpm, first degree AV block, TWI anterior/inf leads, no changes  Recent Labs: 12/06/2020: Magnesium 1.9; TSH 1.097 03/16/2021: ALT 28; BUN 16; Hemoglobin 16.6; Platelets 392; Potassium 3.6; Sodium 134 05/07/2021: Creatinine, Ser 1.10  Recent Lipid Panel    Component Value Date/Time   CHOL 96 05/19/2020 0500   CHOL 117 11/17/2019 1003   TRIG 70 05/19/2020 0500   HDL 27 (L) 05/19/2020 0500  HDL 31 (L) 11/17/2019 1003   CHOLHDL 3.6 05/19/2020 0500   VLDL 14 05/19/2020 0500   LDLCALC 55 05/19/2020 0500   LDLCALC 64 11/17/2019 1003     Physical Exam:    VS:  BP 118/76 (BP Location: Left Arm, Patient Position: Sitting, Cuff Size: Large)    Pulse 66    Ht 5\' 9"  (1.753 m)    Wt 291 lb (132 kg)    SpO2 97%    BMI 42.97 kg/m     Wt Readings from Last 3 Encounters:  06/11/21 291 lb (132 kg)  03/27/21 290 lb (131.5 kg)  03/16/21 295 lb (133.8 kg)     GEN:  Well nourished, well developed in no acute distress HEENT: Normal NECK: No JVD; No carotid bruits LYMPHATICS: No lymphadenopathy CARDIAC: RRR, no murmurs, rubs, gallops RESPIRATORY:  Clear to auscultation without rales, wheezing or rhonchi  ABDOMEN: Soft, non-tender, non-distended MUSCULOSKELETAL:  No edema; No deformity  SKIN: Warm and dry NEUROLOGIC:  Alert and oriented x 3 PSYCHIATRIC:  Normal affect   ASSESSMENT:    1. Palpitations   2. Chronic systolic heart failure ([place])   3. First degree heart block   4. Chest pain, unspecified type   5. Unspecified  premature depolarization    PLAN:    In order of problems listed above:  Palpitations H/o First degree AV block and PACs Reports palpitations/increased heart rate after eating. No associated symptoms. Resolves after an hour. He is only taking Toprol 25mg  daily. EKG today shows NSR with first degree AV block. Prior heart monitor 2021 showed NSR with PAC burden 13%, rare pSVT. I will check labs, BMET, Mag, TSH, CBC. I will get a 2 week heart monitor. I recommend taking Toprol-XL 50mg  daily. We will reassess symptoms at follow-up.   Chronic HFimpEF Patient is euvolemic on exam. Echo 01/2021 showed LVEF 55-60%, no WMA, G1DD, mild MR, aortic dilation is noted, mild dilation of the aortic root measuring 3mm. Continue Toprol, Losartan, spironolactone, and lasix.   Chest/abdominal pain No further chest pain. He is currently undergoing GI work-up and has EGD/colonoscopy scheduled Thursday.    Disposition: Follow up in 6-8 week(s) with MD/APP    Signed, Salvador Bigbee Ninfa Meeker, PA-C  06/11/2021 4:18 PM    [facility]

## 2021-06-12 LAB — MAGNESIUM: Magnesium: 2.2 mg/dL (ref 1.6–2.3)

## 2021-06-12 LAB — CBC
Hematocrit: 48.1 % (ref 37.5–51.0)
Hemoglobin: 16.4 g/dL (ref 13.0–17.7)
MCH: 29.9 pg (ref 26.6–33.0)
MCHC: 34.1 g/dL (ref 31.5–35.7)
MCV: 88 fL (ref 79–97)
Platelets: 413 10*3/uL (ref 150–450)
RBC: 5.48 x10E6/uL (ref 4.14–5.80)
RDW: 12.2 % (ref 11.6–15.4)
WBC: 10 10*3/uL (ref 3.4–10.8)

## 2021-06-12 LAB — BASIC METABOLIC PANEL
BUN/Creatinine Ratio: 14 (ref 9–20)
BUN: 14 mg/dL (ref 6–24)
CO2: 20 mmol/L (ref 20–29)
Calcium: 10 mg/dL (ref 8.7–10.2)
Chloride: 102 mmol/L (ref 96–106)
Creatinine, Ser: 1 mg/dL (ref 0.76–1.27)
Glucose: 89 mg/dL (ref 70–99)
Potassium: 4.1 mmol/L (ref 3.5–5.2)
Sodium: 139 mmol/L (ref 134–144)
eGFR: 89 mL/min/{1.73_m2} (ref >59)

## 2021-06-12 LAB — TSH: TSH: 1.09 u[IU]/mL (ref 0.450–4.500)

## 2021-06-13 ENCOUNTER — Encounter: Payer: Self-pay | Admitting: Gastroenterology

## 2021-06-13 NOTE — H&P (Signed)
Pre-Procedure H&P   Patient ID: Aaron [name] is a 56 y.o. male.  Gastroenterology Provider: Annamaria Helling, DO  PCP: [facility]  Date: 06/14/2021  HPI Mr. Aaron [name] is a 56 y.o. male who presents today for Esophagogastroduodenoscopy and Colonoscopy for abdominal pain, diarrhea, diverticulitis episodes.  Pt with LUQ and suprapubic pain. Lost 25 lbs in 1.56m prior to being seen in the office. He is gaining this weight back. Had episode of diverticulitis and was treated with antibiotics. With improvement. Now having normal bm w/o blood or melena and are well formed. Dark stool was noted with heavy pepto bismol use. Still has gas and indigestion, but reports reflux is controlled with ppi. Was started by pcp on zen pep and carafate but stopped when didn't notice relief. Also underwent IBD lab panel which was minimally increased for s cerevisiae.    Currently dealing with kidney stone and hematuria. No fhx crc or colon polyps or ibd.   Past Medical History:  Diagnosis Date   ADD (attention deficit disorder)    Chronic back pain    disc   Coronary artery disease    Crohn's disease ([place])    Heart murmur    HFrEF (heart failure with reduced ejection fraction) ([place])    Hypertension    Kidney stone    MDD (major depressive disorder)    Obesity     Past Surgical History:  Procedure Laterality Date   CARDIAC CATHETERIZATION     KIDNEY STONE SURGERY     LEFT HEART CATH AND CORONARY ANGIOGRAPHY Left 06/27/2020   Procedure: LEFT HEART CATH AND CORONARY ANGIOGRAPHY;  Surgeon: Nelva Bush, MD;  Location: [place] [facility];  Service: Cardiovascular;  Laterality: Left;   VASECTOMY      Family History No h/o GI disease or malignancy  Review of Systems  Constitutional:  Positive for unexpected weight change. Negative for activity change, appetite change, chills, diaphoresis, fatigue and fever.  HENT:  Negative for trouble swallowing and voice  change.   Respiratory:  Negative for shortness of breath and wheezing.   Cardiovascular:  Negative for chest pain, palpitations and leg swelling.  Gastrointestinal:  Positive for abdominal pain and diarrhea. Negative for abdominal distention, anal bleeding, blood in stool, constipation, nausea and vomiting.  Genitourinary:  Positive for dysuria, frequency and hematuria.  Musculoskeletal:  Negative for arthralgias and myalgias.  Skin:  Negative for color change and pallor.  Neurological:  Negative for dizziness, syncope and weakness.  Psychiatric/Behavioral:  Negative for confusion. The patient is not nervous/anxious.   All other systems reviewed and are negative.   Medications No current facility-administered medications on file prior to encounter.   Current Outpatient Medications on File Prior to Encounter  Medication Sig Dispense Refill   albuterol (VENTOLIN HFA) 108 (90 Base) MCG/ACT inhaler Inhale 2 puffs into the lungs every 6 (six) hours as needed for wheezing or shortness of breath. 18 g 5   ibuprofen (ADVIL) 600 MG tablet Take 600 mg by mouth every 6 (six) hours as needed.     losartan (COZAAR) 100 MG tablet Take 1 tablet (100 mg total) by mouth daily. 90 tablet 3   metoprolol succinate (TOPROL-XL) 50 MG 24 hr tablet Take 1 tablet (50 mg total) by mouth daily. Take with or immediately following a meal. 90 tablet 1   Pancrelipase, Lip-Prot-Amyl, (ZENPEP) 40000-126000 units CPEP Take by mouth 3 (three) times daily.     pregabalin (LYRICA) 100 MG capsule Take  100 mg by mouth at bedtime.     Vitamin D, Ergocalciferol, (DRISDOL) 1.25 MG (50000 UNIT) CAPS capsule Take 1 capsule by mouth once a week.     acetaminophen (TYLENOL) 325 MG tablet Take 650-975 mg by mouth every 6 (six) hours as needed for moderate pain or headache (for pain.).     cetirizine (ZYRTEC) 10 MG tablet Take 1 tablet (10 mg total) by mouth daily as needed (allergies.).     dicyclomine (BENTYL) 10 MG capsule Take 10 mg  by mouth 4 (four) times daily -  before meals and at bedtime. (Patient not taking: Reported on 06/14/2021)     fluticasone (FLONASE) 50 MCG/ACT nasal spray Place 2 sprays into both nostrils daily as needed (allergies.).     gabapentin (NEURONTIN) 300 MG capsule Take 300 mg by mouth daily. 1 capsule in morning And 2 capsule at bedtime (Patient not taking: Reported on 06/14/2021)     ipratropium (ATROVENT HFA) 17 MCG/ACT inhaler Inhale 2 puffs into the lungs every 6 (six) hours.     montelukast (SINGULAIR) 10 MG tablet Take 1 tablet (10 mg total) by mouth at bedtime as needed (allergies.).     spironolactone (ALDACTONE) 50 MG tablet TAKE 1 TABLET BY MOUTH EVERY DAY 90 tablet 2   traMADol HCl 100 MG TABS Take 1 tablet by mouth 3 (three) times daily as needed. (Patient not taking: Reported on 06/11/2021)      Pertinent medications related to GI and procedure were reviewed by me with the patient prior to the procedure   Current Facility-Administered Medications:    0.9 %  sodium chloride infusion, , Intravenous, Continuous, Annamaria Helling, DO, Last Rate: 20 mL/hr at 06/14/21 0957, New Bag at 06/14/21 0957      No Known Allergies Allergies were reviewed by me prior to the procedure  Objective    Vitals:   06/14/21 0936  BP: (!) 148/103  Pulse: 80  Resp: 16  Temp: (!) 97.1 F (36.2 C)  TempSrc: Temporal  SpO2: 99%  Weight: 131.5 kg  Height: 5\' 9"  (1.753 m)     Physical Exam Vitals and nursing note reviewed.  Constitutional:      General: He is not in acute distress.    Appearance: Normal appearance. He is obese. He is not ill-appearing, toxic-appearing or diaphoretic.  HENT:     Head: Normocephalic and atraumatic.     Nose: Nose normal.     Mouth/Throat:     Mouth: Mucous membranes are moist.     Pharynx: Oropharynx is clear.  Eyes:     General: No scleral icterus.    Extraocular Movements: Extraocular movements intact.  Cardiovascular:     Rate and Rhythm: Normal rate  and regular rhythm.     Heart sounds: Normal heart sounds. No murmur heard.   No friction rub. No gallop.  Pulmonary:     Effort: Pulmonary effort is normal. No respiratory distress.     Breath sounds: Normal breath sounds. No wheezing, rhonchi or rales.  Abdominal:     General: Bowel sounds are normal. There is no distension.     Palpations: Abdomen is soft.     Tenderness: There is no abdominal tenderness. There is no guarding or rebound.  Musculoskeletal:     Cervical back: Neck supple.     Right lower leg: No edema.     Left lower leg: No edema.  Skin:    General: Skin is warm and dry.  Coloration: Skin is not jaundiced or pale.  Neurological:     General: No focal deficit present.     Mental Status: He is alert and oriented to person, place, and time. Mental status is at baseline.  Psychiatric:        Mood and Affect: Mood normal.        Behavior: Behavior normal.        Thought Content: Thought content normal.        Judgment: Judgment normal.     Assessment:  Mr. Aaron [name] is a 56 y.o. male  who presents today for Esophagogastroduodenoscopy and Colonoscopy for abdominal pain, diarrhea, diverticulitis episodes.  Plan:  Esophagogastroduodenoscopy and Colonoscopy with possible intervention today  Esophagogastroduodenoscopy and Colonoscopy with possible biopsy, control of bleeding, polypectomy, and interventions as necessary has been discussed with the patient/patient representative. Informed consent was obtained from the patient/patient representative after explaining the indication, nature, and risks of the procedure including but not limited to death, bleeding, perforation, missed neoplasm/lesions, cardiorespiratory compromise, and reaction to medications. Opportunity for questions was given and appropriate answers were provided. Patient/patient representative has verbalized understanding is amenable to undergoing the procedure.   Annamaria Helling, DO   [facility]  Portions of the record may have been created with voice recognition software. Occasional wrong-word or 'sound-a-like' substitutions may have occurred due to the inherent limitations of voice recognition software.  Read the chart carefully and recognize, using context, where substitutions may have occurred.

## 2021-06-14 ENCOUNTER — Ambulatory Visit
Admission: RE | Admit: 2021-06-14 | Discharge: 2021-06-14 | Disposition: A | Discharge status: Home / Self Care | Payer: Medicare Other | Attending: Gastroenterology | Admitting: Gastroenterology

## 2021-06-14 ENCOUNTER — Encounter: Payer: Self-pay | Admitting: Gastroenterology

## 2021-06-14 ENCOUNTER — Ambulatory Visit: Payer: Medicare Other | Admitting: Registered Nurse

## 2021-06-14 ENCOUNTER — Encounter
Admission: RE | Disposition: A | Discharge status: Home / Self Care | Payer: Self-pay | Source: Home / Self Care | Attending: Gastroenterology

## 2021-06-14 ENCOUNTER — Other Ambulatory Visit: Payer: Self-pay

## 2021-06-14 DIAGNOSIS — E669 Obesity, unspecified: Secondary | ICD-10-CM | POA: Insufficient documentation

## 2021-06-14 DIAGNOSIS — G8929 Other chronic pain: Secondary | ICD-10-CM | POA: Insufficient documentation

## 2021-06-14 DIAGNOSIS — F418 Other specified anxiety disorders: Secondary | ICD-10-CM | POA: Diagnosis not present

## 2021-06-14 DIAGNOSIS — K648 Other hemorrhoids: Secondary | ICD-10-CM | POA: Diagnosis not present

## 2021-06-14 DIAGNOSIS — K2950 Unspecified chronic gastritis without bleeding: Secondary | ICD-10-CM | POA: Insufficient documentation

## 2021-06-14 DIAGNOSIS — D1779 Benign lipomatous neoplasm of other sites: Secondary | ICD-10-CM | POA: Diagnosis not present

## 2021-06-14 DIAGNOSIS — I2510 Atherosclerotic heart disease of native coronary artery without angina pectoris: Secondary | ICD-10-CM | POA: Diagnosis not present

## 2021-06-14 DIAGNOSIS — K2970 Gastritis, unspecified, without bleeding: Secondary | ICD-10-CM | POA: Insufficient documentation

## 2021-06-14 DIAGNOSIS — K621 Rectal polyp: Secondary | ICD-10-CM | POA: Diagnosis not present

## 2021-06-14 DIAGNOSIS — R319 Hematuria, unspecified: Secondary | ICD-10-CM | POA: Insufficient documentation

## 2021-06-14 DIAGNOSIS — R011 Cardiac murmur, unspecified: Secondary | ICD-10-CM | POA: Insufficient documentation

## 2021-06-14 DIAGNOSIS — K219 Gastro-esophageal reflux disease without esophagitis: Secondary | ICD-10-CM | POA: Insufficient documentation

## 2021-06-14 DIAGNOSIS — K2289 Other specified disease of esophagus: Secondary | ICD-10-CM | POA: Diagnosis not present

## 2021-06-14 DIAGNOSIS — F329 Major depressive disorder, single episode, unspecified: Secondary | ICD-10-CM | POA: Insufficient documentation

## 2021-06-14 DIAGNOSIS — D127 Benign neoplasm of rectosigmoid junction: Secondary | ICD-10-CM | POA: Insufficient documentation

## 2021-06-14 DIAGNOSIS — F988 Other specified behavioral and emotional disorders with onset usually occurring in childhood and adolescence: Secondary | ICD-10-CM | POA: Insufficient documentation

## 2021-06-14 DIAGNOSIS — K317 Polyp of stomach and duodenum: Secondary | ICD-10-CM | POA: Insufficient documentation

## 2021-06-14 DIAGNOSIS — K529 Noninfective gastroenteritis and colitis, unspecified: Secondary | ICD-10-CM | POA: Insufficient documentation

## 2021-06-14 DIAGNOSIS — K640 First degree hemorrhoids: Secondary | ICD-10-CM | POA: Insufficient documentation

## 2021-06-14 DIAGNOSIS — K5792 Diverticulitis of intestine, part unspecified, without perforation or abscess without bleeding: Secondary | ICD-10-CM | POA: Insufficient documentation

## 2021-06-14 DIAGNOSIS — K5090 Crohn's disease, unspecified, without complications: Secondary | ICD-10-CM | POA: Insufficient documentation

## 2021-06-14 DIAGNOSIS — R1012 Left upper quadrant pain: Secondary | ICD-10-CM | POA: Diagnosis present

## 2021-06-14 DIAGNOSIS — R102 Pelvic and perineal pain: Secondary | ICD-10-CM | POA: Insufficient documentation

## 2021-06-14 DIAGNOSIS — N200 Calculus of kidney: Secondary | ICD-10-CM | POA: Diagnosis not present

## 2021-06-14 DIAGNOSIS — D124 Benign neoplasm of descending colon: Secondary | ICD-10-CM | POA: Insufficient documentation

## 2021-06-14 DIAGNOSIS — M549 Dorsalgia, unspecified: Secondary | ICD-10-CM | POA: Insufficient documentation

## 2021-06-14 DIAGNOSIS — Z6841 Body Mass Index (BMI) 40.0 and over, adult: Secondary | ICD-10-CM | POA: Insufficient documentation

## 2021-06-14 DIAGNOSIS — I509 Heart failure, unspecified: Secondary | ICD-10-CM | POA: Diagnosis not present

## 2021-06-14 DIAGNOSIS — D125 Benign neoplasm of sigmoid colon: Secondary | ICD-10-CM | POA: Insufficient documentation

## 2021-06-14 DIAGNOSIS — Z79899 Other long term (current) drug therapy: Secondary | ICD-10-CM | POA: Diagnosis not present

## 2021-06-14 DIAGNOSIS — I110 Hypertensive heart disease with heart failure: Secondary | ICD-10-CM | POA: Diagnosis not present

## 2021-06-14 DIAGNOSIS — D122 Benign neoplasm of ascending colon: Secondary | ICD-10-CM | POA: Insufficient documentation

## 2021-06-14 HISTORY — DX: Other specified behavioral and emotional disorders with onset usually occurring in childhood and adolescence: F98.8

## 2021-06-14 HISTORY — DX: Cardiac murmur, unspecified: R01.1

## 2021-06-14 HISTORY — DX: Obesity, unspecified: E66.9

## 2021-06-14 HISTORY — PX: COLONOSCOPY: SHX5424

## 2021-06-14 HISTORY — PX: ESOPHAGOGASTRODUODENOSCOPY: SHX5428

## 2021-06-14 SURGERY — COLONOSCOPY
Anesthesia: General

## 2021-06-14 MED ORDER — LIDOCAINE HCL (CARDIAC) PF 100 MG/5ML IV SOSY
PREFILLED_SYRINGE | INTRAVENOUS | Status: DC | PRN
  Administered 2021-06-14: 40 mg via INTRAVENOUS

## 2021-06-14 MED ORDER — PROPOFOL 500 MG/50ML IV EMUL
INTRAVENOUS | Status: DC | PRN
  Administered 2021-06-14: 150 ug/kg/min via INTRAVENOUS

## 2021-06-14 MED ORDER — GLYCOPYRROLATE 0.2 MG/ML IJ SOLN
INTRAMUSCULAR | Status: DC | PRN
  Administered 2021-06-14: .2 mg via INTRAVENOUS

## 2021-06-14 MED ORDER — DEXMEDETOMIDINE HCL IN NACL 200 MCG/50ML IV SOLN
INTRAVENOUS | Status: AC
  Filled 2021-06-14: qty 50

## 2021-06-14 MED ORDER — PROPOFOL 500 MG/50ML IV EMUL
INTRAVENOUS | Status: AC
  Filled 2021-06-14: qty 50

## 2021-06-14 MED ORDER — GLYCOPYRROLATE 0.2 MG/ML IJ SOLN
INTRAMUSCULAR | Status: AC
  Filled 2021-06-14: qty 1

## 2021-06-14 MED ORDER — PROPOFOL 10 MG/ML IV BOLUS
INTRAVENOUS | Status: DC | PRN
Start: 2021-06-14 — End: 2021-06-14
  Administered 2021-06-14: 100 mg via INTRAVENOUS

## 2021-06-14 MED ORDER — LIDOCAINE HCL (PF) 2 % IJ SOLN
INTRAMUSCULAR | Status: AC
  Filled 2021-06-14: qty 5

## 2021-06-14 MED ORDER — SODIUM CHLORIDE 0.9 % IV SOLN: INTRAVENOUS | Status: DC

## 2021-06-14 NOTE — Anesthesia Preprocedure Evaluation (Signed)
Anesthesia Evaluation  Patient identified by MRN, date of birth, ID band Patient awake    Reviewed: Allergy & Precautions, H&P , NPO status , Patient's Chart, lab work & pertinent test results, reviewed documented beta blocker date and time   Airway Mallampati: I  TM Distance: >3 FB Neck ROM: limited    Dental  (+) Dental Advidsory Given, Missing, Teeth Intact   Pulmonary neg shortness of breath, sleep apnea , neg COPD, neg recent URI,    Pulmonary exam normal breath sounds clear to auscultation       Cardiovascular Exercise Tolerance: Good hypertension, (-) angina+ CAD and +CHF  (-) Past MI and (-) Cardiac Stents Normal cardiovascular exam+ dysrhythmias + Valvular Problems/Murmurs  Rhythm:regular Rate:Normal     Neuro/Psych PSYCHIATRIC DISORDERS Anxiety Depression negative neurological ROS     GI/Hepatic negative GI ROS, Neg liver ROS,   Endo/Other  neg diabetesMorbid obesity  Renal/GU Renal disease (kidney stones)  negative genitourinary   Musculoskeletal   Abdominal   Peds  Hematology negative hematology ROS (+)   Anesthesia Other Findings Past Medical History: No date: ADD (attention deficit disorder) No date: Chronic back pain     Comment:  disc No date: Coronary artery disease No date: Crohn's disease (HCC) No date: Heart murmur No date: HFrEF (heart failure with reduced ejection fraction) (HCC) No date: Hypertension No date: Kidney stone No date: MDD (major depressive disorder) No date: Obesity   Reproductive/Obstetrics negative OB ROS                             Anesthesia Physical Anesthesia Plan  ASA: 3  Anesthesia Plan: General   Post-op Pain Management:    Induction: Intravenous  PONV Risk Score and Plan: 2 and Propofol infusion and TIVA  Airway Management Planned: Natural Airway and Nasal Cannula  Additional Equipment:   Intra-op Plan:   Post-operative  Plan:   Informed Consent: I have reviewed the patients History and Physical, chart, labs and discussed the procedure including the risks, benefits and alternatives for the proposed anesthesia with the patient or authorized representative who has indicated his/her understanding and acceptance.     Dental Advisory Given  Plan Discussed with: Anesthesiologist, CRNA and Surgeon  Anesthesia Plan Comments:         Anesthesia Quick Evaluation

## 2021-06-14 NOTE — Transfer of Care (Signed)
Immediate Anesthesia Transfer of Care Note  Patient: Aaron [name]  Procedure(s) Performed: COLONOSCOPY ESOPHAGOGASTRODUODENOSCOPY (EGD)  Patient Location: PACU and Endoscopy Unit  Anesthesia Type:General  Level of Consciousness: drowsy  Airway & Oxygen Therapy: Patient Spontanous Breathing  Post-op Assessment: Report given to RN and Post -op Vital signs reviewed and stable  Post vital signs: Reviewed and stable  Last Vitals:  Vitals Value Taken Time  BP 124/83 06/14/21 1105  Temp    Pulse 74 06/14/21 1107  Resp 15 06/14/21 1107  SpO2 100 % 06/14/21 1107  Vitals shown include unvalidated device data.  Last Pain:  Vitals:   06/14/21 1100  TempSrc: Temporal  PainSc:          Complications: No notable events documented.

## 2021-06-14 NOTE — Anesthesia Procedure Notes (Signed)
Date/Time: 06/14/2021 10:23 AM Performed by: Doreen Salvage, CRNA Pre-anesthesia Checklist: Patient identified, Emergency Drugs available, Suction available and Patient being monitored Patient Re-evaluated:Patient Re-evaluated prior to induction Oxygen Delivery Method: Supernova nasal CPAP Induction Type: IV induction Dental Injury: Teeth and Oropharynx as per pre-operative assessment  Comments: Nasal cannula with etCO2 monitoring

## 2021-06-14 NOTE — Op Note (Addendum)
[facility] Gastroenterology Patient Name: Aaron [name] Procedure Date: 06/14/2021 10:08 AM MRN: [id number] Account #: [id number] Date of Birth: January 12, 1966 Admit Type: Outpatient Age: 56 Room: [facility] ENDO ROOM 1 Gender: Male Note Status: Supervisor Override Instrument Name: Jasper Riling [id number] Procedure:             Colonoscopy Indications:           Screening for colorectal malignant neoplasm, Family                         history of colon polyps Providers:             Rueben Bash, DO Referring MD:          Perrin Maltese, MD (Referring MD) Medicines:             Monitored Anesthesia Care Complications:         No immediate complications. Estimated blood loss:                         Minimal. Procedure:             Pre-Anesthesia Assessment:                        - Prior to the procedure, a History and Physical was                         performed, and patient medications and allergies were                         reviewed. The patient is competent. The risks and                         benefits of the procedure and the sedation options and                         risks were discussed with the patient. All questions                         were answered and informed consent was obtained.                         Patient identification and proposed procedure were                         verified by the physician, the nurse, the anesthetist                         and the technician in the endoscopy suite. Mental                         Status Examination: alert and oriented. Airway                         Examination: normal oropharyngeal airway and neck                         mobility. Respiratory Examination: clear to  auscultation. CV Examination: RRR, no murmurs, no S3                         or S4. Prophylactic Antibiotics: The patient does not                         require prophylactic antibiotics. Prior                          Anticoagulants: The patient has taken no previous                         anticoagulant or antiplatelet agents. ASA Grade                         Assessment: III - A patient with severe systemic                         disease. After reviewing the risks and benefits, the                         patient was deemed in satisfactory condition to                         undergo the procedure. The anesthesia plan was to use                         monitored anesthesia care (MAC). Immediately prior to                         administration of medications, the patient was                         re-assessed for adequacy to receive sedatives. The                         heart rate, respiratory rate, oxygen saturations,                         blood pressure, adequacy of pulmonary ventilation, and                         response to care were monitored throughout the                         procedure. The physical status of the patient was                         re-assessed after the procedure.                        After obtaining informed consent, the colonoscope was                         passed under direct vision. Throughout the procedure,                         the patient's blood pressure, pulse, and oxygen  saturations were monitored continuously. The                         Colonoscope was introduced through the anus and                         advanced to the the terminal ileum, with                         identification of the appendiceal orifice and IC                         valve. The colonoscopy was performed without                         difficulty. The patient tolerated the procedure well.                         The quality of the bowel preparation was evaluated                         using the BBPS [facility] Bowel Preparation Scale) with                         scores of: Right Colon = 3, Transverse Colon = 3 and                         Left  Colon = 3 (entire mucosa seen well with no                         residual staining, small fragments of stool or opaque                         liquid). The total BBPS score equals 9. The terminal                         ileum, ileocecal valve, appendiceal orifice, and                         rectum were photographed. Findings:      The perianal and digital rectal examinations were normal. Pertinent       negatives include normal sphincter tone.      The terminal ileum appeared normal. Estimated blood loss: none.      Non-bleeding internal hemorrhoids were found during retroflexion. The       hemorrhoids were Grade I (internal hemorrhoids that do not prolapse).      Normal mucosa was found in the entire colon. Biopsies for histology were       taken with a cold forceps from the right colon and left colon for       evaluation of microscopic colitis. Estimated blood loss was minimal. no       signs of inflammatory bowel disease.      A 8 to 10 mm polyp was found in the sigmoid colon. The polyp was       pedunculated. The polyp was removed with a hot snare. Resection and       retrieval were complete. Estimated blood loss was minimal.  Two sessile polyps were found in the rectum and ascending colon. The       polyps were 1 to 2 mm in size. These polyps were removed with a cold       biopsy forceps. Resection and retrieval were complete. Estimated blood       loss was minimal.      A 5 to 7 mm polyp was found in the descending colon. The polyp was       sessile. The polyp was removed with a cold snare. Resection and       retrieval were complete. Estimated blood loss was minimal.      No signs of diverticula on endoscopic exam. Estimated blood loss: none.      There was a small lipoma, in the transverse colon. Biopsies were taken       with a cold forceps for histology. Estimated blood loss was minimal.      The exam was otherwise without abnormality on direct and retroflexion        views. Impression:            - The examined portion of the ileum was normal.                        - Non-bleeding internal hemorrhoids.                        - Normal mucosa in the entire examined colon. Biopsied.                        - One 8 to 10 mm polyp in the sigmoid colon, removed                         with a hot snare. Resected and retrieved.                        - Two 1 to 2 mm polyps in the rectum and in the                         ascending colon, removed with a cold biopsy forceps.                         Resected and retrieved.                        - One 5 to 7 mm polyp in the descending colon, removed                         with a cold snare. Resected and retrieved.                        - Small lipoma in the transverse colon. Biopsied.                        - The examination was otherwise normal on direct and                         retroflexion views. Recommendation:        - Discharge patient to home.                        -  Resume previous diet.                        - Continue present medications.                        - No aspirin, ibuprofen, naproxen, or other                         non-steroidal anti-inflammatory drugs for 5 days after                         polyp removal.                        - Await pathology results.                        - Repeat colonoscopy for surveillance based on                         pathology results.                        - Return to referring physician as previously                         scheduled. Procedure Code(s):     --- Professional ---                        [id number], Colonoscopy, flexible; with removal of                         tumor(s), polyp(s), or other lesion(s) by snare                         technique                        45380, 69, Colonoscopy, flexible; with biopsy, single                         or multiple Diagnosis Code(s):     --- Professional ---                        K64.0, First degree  hemorrhoids                        K63.5, Polyp of colon                        K62.1, Rectal polyp                        K52.9, Noninfective gastroenteritis and colitis,                         unspecified CPT copyright 2019 American Medical Association. All rights reserved. The codes documented in this report are preliminary and upon coder review may  be revised to meet current compliance requirements. Attending Participation:      I personally performed the entire procedure. Volney American, DO Annamaria Helling DO, DO 06/14/2021  11:07:54 AM This report has been signed electronically. Number of Addenda: 0 Note Initiated On: 06/14/2021 10:08 AM Scope Withdrawal Time: 0 hours 15 minutes 5 seconds  Total Procedure Duration: 0 hours 21 minutes 41 seconds  Estimated Blood Loss:  Estimated blood loss was minimal.      [facility]

## 2021-06-14 NOTE — Op Note (Signed)
[facility] Gastroenterology Patient Name: Josephanthony Tindel Procedure Date: 06/14/2021 10:10 AM MRN: [id number] Account #: [id number] Date of Birth: Aug 01, 1965 Admit Type: Outpatient Age: 56 Room: [facility] ENDO ROOM 1 Gender: Male Note Status: Finalized Instrument Name: Upper Endoscope [id number] Procedure:             Upper GI endoscopy Indications:           Abdominal pain in the left upper quadrant Providers:             Rueben Bash, DO Referring MD:          Perrin Maltese, MD (Referring MD) Medicines:             Monitored Anesthesia Care Complications:         No immediate complications. Estimated blood loss:                         Minimal. Procedure:             Pre-Anesthesia Assessment:                        - Prior to the procedure, a History and Physical was                         performed, and patient medications and allergies were                         reviewed. The patient is competent. The risks and                         benefits of the procedure and the sedation options and                         risks were discussed with the patient. All questions                         were answered and informed consent was obtained.                         Patient identification and proposed procedure were                         verified by the physician, the nurse, the anesthetist                         and the technician in the endoscopy suite. Mental                         Status Examination: alert and oriented. Airway                         Examination: normal oropharyngeal airway and neck                         mobility. Respiratory Examination: clear to                         auscultation. CV Examination: RRR, no murmurs, no S3  or S4. Prophylactic Antibiotics: The patient does not                         require prophylactic antibiotics. Prior                         Anticoagulants: The patient has taken no  previous                         anticoagulant or antiplatelet agents. ASA Grade                         Assessment: III - A patient with severe systemic                         disease. After reviewing the risks and benefits, the                         patient was deemed in satisfactory condition to                         undergo the procedure. The anesthesia plan was to use                         monitored anesthesia care (MAC). Immediately prior to                         administration of medications, the patient was                         re-assessed for adequacy to receive sedatives. The                         heart rate, respiratory rate, oxygen saturations,                         blood pressure, adequacy of pulmonary ventilation, and                         response to care were monitored throughout the                         procedure. The physical status of the patient was                         re-assessed after the procedure.                        After obtaining informed consent, the endoscope was                         passed under direct vision. Throughout the procedure,                         the patient's blood pressure, pulse, and oxygen                         saturations were monitored continuously. The Endoscope  was introduced through the mouth, and advanced to the                         second part of duodenum. The upper GI endoscopy was                         accomplished without difficulty. The patient tolerated                         the procedure well. Findings:      The ampulla, duodenal bulb, first portion of the duodenum and second       portion of the duodenum were normal. Estimated blood loss: none.      A few less than 5 mm sessile polyps with no bleeding and no stigmata of       recent bleeding were found in the gastric antrum. Biopsies were taken       with a cold forceps for histology. Estimated blood loss was  minimal.      Localized mild inflammation characterized by erosions was found in the       gastric antrum. Biopsies were taken with a cold forceps for Helicobacter       pylori testing. Estimated blood loss was minimal.      The Z-line was variable. Biopsies were taken with a cold forceps for       histology. Estimated blood loss was minimal.      Esophagogastric landmarks were identified: the gastroesophageal junction       was found at 36 cm from the incisors.      The exam of the esophagus was otherwise normal.      The exam was otherwise without abnormality. Impression:            - Normal ampulla, duodenal bulb, first portion of the                         duodenum and second portion of the duodenum.                        - A few gastric polyps. Biopsied.                        - Gastritis. Biopsied.                        - Z-line variable. Biopsied.                        - Esophagogastric landmarks identified.                        - The examination was otherwise normal. Recommendation:        - Discharge patient to home.                        - Resume previous diet.                        - Continue present medications.                        - Await pathology results.                        -  Return to referring physician as previously                         scheduled. Procedure Code(s):     --- Professional ---                        [id number], Esophagogastroduodenoscopy, flexible,                         transoral; with biopsy, single or multiple Diagnosis Code(s):     --- Professional ---                        K31.7, Polyp of stomach and duodenum                        K29.70, Gastritis, unspecified, without bleeding                        K22.8, Other specified diseases of esophagus                        R10.12, Left upper quadrant pain CPT copyright 2019 American Medical Association. All rights reserved. The codes documented in this report are preliminary and upon  coder review may  be revised to meet current compliance requirements. Attending Participation:      I personally performed the entire procedure. Volney American, DO Annamaria Helling DO, DO 06/14/2021 11:11:03 AM This report has been signed electronically. Number of Addenda: 0 Note Initiated On: 06/14/2021 10:10 AM Estimated Blood Loss:  Estimated blood loss was minimal.      [facility]

## 2021-06-14 NOTE — Interval H&P Note (Signed)
History and Physical Interval Note: Preprocedure H&P from 06/14/21  was reviewed and there was no interval change after seeing and examining the patient.  Written consent was obtained from the patient after discussion of risks, benefits, and alternatives. Patient has consented to proceed with Esophagogastroduodenoscopy and Colonoscopy with possible intervention   06/14/2021 10:18 AM  Aaron [name]  has presented today for surgery, with the diagnosis of Left upper quadrant abdominal pain (R10.12) Family hx colonic polyps (Z83.71) Screening for colon cancer (Z12.11).  The various methods of treatment have been discussed with the patient and family. After consideration of risks, benefits and other options for treatment, the patient has consented to  Procedure(s): COLONOSCOPY (N/A) ESOPHAGOGASTRODUODENOSCOPY (EGD) (N/A) as a surgical intervention.  The patient's history has been reviewed, patient examined, no change in status, stable for surgery.  I have reviewed the patient's chart and labs.  Questions were answered to the patient's satisfaction.     Annamaria Helling

## 2021-06-15 ENCOUNTER — Encounter: Payer: Self-pay | Admitting: Gastroenterology

## 2021-06-15 LAB — SURGICAL PATHOLOGY

## 2021-06-16 NOTE — Anesthesia Postprocedure Evaluation (Signed)
Anesthesia Post Note  Patient: Aaron [name]  Procedure(s) Performed: COLONOSCOPY ESOPHAGOGASTRODUODENOSCOPY (EGD)  Anesthesia Type: General Anesthetic complications: no   No notable events documented.   Last Vitals:  Vitals:   06/14/21 1120 06/14/21 1140  BP: 131/87 (!) 147/99  Pulse: 68   Resp: 18 20  Temp:    SpO2: 99%     Last Pain:  Vitals:   06/15/21 0746  TempSrc:   PainSc: 0-No pain                 Martha Clan

## 2021-06-19 ENCOUNTER — Other Ambulatory Visit: Payer: Self-pay | Admitting: Internal Medicine

## 2021-06-20 ENCOUNTER — Telehealth: Payer: Self-pay | Admitting: Internal Medicine

## 2021-06-20 ENCOUNTER — Ambulatory Visit (INDEPENDENT_AMBULATORY_CARE_PROVIDER_SITE_OTHER): Payer: Medicare Other

## 2021-06-20 DIAGNOSIS — I440 Atrioventricular block, first degree: Secondary | ICD-10-CM

## 2021-06-20 DIAGNOSIS — I4940 Unspecified premature depolarization: Secondary | ICD-10-CM

## 2021-06-20 NOTE — Telephone Encounter (Signed)
STAT if HR is under 50 or over 120 (normal HR is 60-100 beats per minute)  What is your heart rate? 82 now  Do you have a log of your heart rate readings (document readings)? Over 100 earlier  Do you have any other symptoms? 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  Would like to know if they should schedule an appt. Please call to discuss.

## 2021-06-20 NOTE — Telephone Encounter (Signed)
Called and spoke to pt's wife, Aaron [name] [facility] approved). Pt with incr HR earlier today ~100 per home pulse ox.  Pt's wife took him to local [facility] for evaluation. Was told he was in NSR.  Pt's HR is now 65 while on the phone.  Pt denies SOB, denies chest pain.   Pt continues Metoprolol Succinate 50 mg daily and has not missed any doses.   Pt last seen 06/11/21 with Cadence Kathlen Mody, PA-C and Zio XT ordered for two weeks.  Pt's wife states that she never received in the mail.  Completed Zio order in pt's chart and called Zio rep Otto Herb.  Almyra Free will make sure monitor is expedited and overnighted to pt.   Advised pt's wife to continue to monitor pt's HR and s/s at home.  If incr HR with SOB, chest pain, or becomes unstable call 911.  Pt's wife voiced understanding.   Will forward to Cadence to make aware and for any further recc.

## 2021-06-21 NOTE — Telephone Encounter (Signed)
Was able to return call to Aaron [name], he reports no increase HR since yesterday afternoon. Reported was driving and felt his heart racing, stated HR 120s, lasted for a good 30 mins. Went to FD and they reported HR "normal" and HR 60s.  Has not had anymore issues since yesterday afternoon. Continues to take his Toprol 50 mg daily.  Advised Megan called ZIO rep, they were going to make sure ZIO monitor was expedited and overnighted to pt, hopefully arrive by Sat. Once has ZIO in place, will be able to see 14 days worth of heart rhythm for review once monitor is return to determine HR and rhythms, at that time will be able to discuss POC and medication adjustments. Mr. Weigelt verbalized understanding.   Reminded pt to continue to monitor HR and s/s at home. If increase HR with SOB, chest pain, or becomes unstable call 911 or have wife drive to ER for interventions. Mr. Standage voiced understanding. Otherwise all questions were address and no additional concerns at this time. Mr. Stiefel thankful for the return call and advice. Agreeable to plan, will await ZIO monitor, and will call back for anything further.

## 2021-06-21 NOTE — Telephone Encounter (Signed)
Patient calling with update call back   States he had to call ems for eval  Still has not received zio

## 2021-06-22 DIAGNOSIS — I4940 Unspecified premature depolarization: Secondary | ICD-10-CM | POA: Diagnosis not present

## 2021-06-22 DIAGNOSIS — I440 Atrioventricular block, first degree: Secondary | ICD-10-CM

## 2021-07-24 ENCOUNTER — Telehealth: Payer: Self-pay | Admitting: Emergency Medicine

## 2021-07-24 NOTE — Telephone Encounter (Signed)
-----   Message from [place], PA-C sent at 07/24/2021  2:24 PM EST ----- Heart monitor showed predominately normal rhythm. Rare PAC/PVCs, 13 atrial runs lasting up to 15.2 seconds, no sustained arrhythmia.  Overall unremarkable. BB previously increased.

## 2021-07-24 NOTE — Telephone Encounter (Signed)
Called patient to go over results of monitor. Pt verbalized understanding, but wanted to know if the monitor showed that his heart rate speeds up after eating. I explained that the monitor results did not show this specifically, and that his HR ranged from 34-136 bpm in sinus. Pt again asked if that was after eating, because he stated that he has a pulse ox monitor at home that shows that his heart rate increases after eating. Explained that I could not see from the reports when he may have had increases in his heart rate and that he could discuss these concerns further at his scheduled follow up appointment. Pt verbalized understanding and voiced appreciation for the call.

## 2021-07-28 ENCOUNTER — Other Ambulatory Visit: Payer: Self-pay | Admitting: Cardiology

## 2021-07-28 ENCOUNTER — Other Ambulatory Visit: Payer: Self-pay | Admitting: Family

## 2021-07-30 ENCOUNTER — Other Ambulatory Visit: Payer: Self-pay | Admitting: Internal Medicine

## 2021-07-30 NOTE — Telephone Encounter (Signed)
Patient not sure this appt is needed will wait until 3/8 visit with APP and decide

## 2021-07-30 NOTE — Telephone Encounter (Signed)
Please schedule overdue F/U appointment with Dr. Quentin Ore. Thank you!

## 2021-07-30 NOTE — Telephone Encounter (Signed)
Rx(s) sent to pharmacy electronically.  

## 2021-08-08 ENCOUNTER — Other Ambulatory Visit: Payer: Self-pay

## 2021-08-08 ENCOUNTER — Encounter: Payer: Self-pay | Admitting: Medical

## 2021-08-08 ENCOUNTER — Ambulatory Visit (INDEPENDENT_AMBULATORY_CARE_PROVIDER_SITE_OTHER): Payer: Medicare Other | Admitting: Medical

## 2021-08-08 VITALS — BP 110/80 | HR 76 | Ht 69.0 in | Wt 292.5 lb

## 2021-08-08 DIAGNOSIS — I428 Other cardiomyopathies: Secondary | ICD-10-CM

## 2021-08-08 DIAGNOSIS — R002 Palpitations: Secondary | ICD-10-CM | POA: Diagnosis not present

## 2021-08-08 DIAGNOSIS — I5022 Chronic systolic (congestive) heart failure: Secondary | ICD-10-CM

## 2021-08-08 MED ORDER — PANTOPRAZOLE SODIUM 40 MG PO TBEC: 40.0000 mg | DELAYED_RELEASE_TABLET | Freq: Every day | ORAL | 11 refills | Status: DC

## 2021-08-08 NOTE — Patient Instructions (Signed)
Medication Instructions:  ?Your physician has recommended you make the following change in your medication:  ? ?START taking pantoprazole (Protonix) 40 mg daily  ? ?*If you need a refill on your cardiac medications before your next appointment, please call your pharmacy* ? ? ?Lab Work: ?None ordered ? ?If you have labs (blood work) drawn today and your tests are completely normal, you will receive your results only by: ?MyChart Message (if you have MyChart) OR ?A paper copy in the mail ?If you have any lab test that is abnormal or we need to change your treatment, we will call you to review the results. ? ? ?Testing/Procedures: ?None ordered ? ? ?Follow-Up: ?At [facility], you and your health needs are our priority.  As part of our continuing mission to provide you with exceptional heart care, we have created designated Provider Care Teams.  These Care Teams include your primary Cardiologist (physician) and Advanced Practice Providers (APPs -  Physician Assistants and Nurse Practitioners) who all work together to provide you with the care you need, when you need it. ? ?We recommend signing up for the patient portal called "MyChart".  Sign up information is provided on this After Visit Summary.  MyChart is used to connect with patients for Virtual Visits (Telemedicine).  Patients are able to view lab/test results, encounter notes, upcoming appointments, etc.  Non-urgent messages can be sent to your provider as well.   ?To learn more about what you can do with MyChart, go to [email or web address].   ? ?Your next appointment:   ?3 month(s) ? ?The format for your next appointment:   ?In Person ? ?Provider:   ?You may see Nelva Bush, MD or one of the following Advanced Practice Providers on your designated Care Team:   ?Murray Hodgkins, NP ?Christell Faith, PA-C ?Cadence Kathlen Mody, PA-C ? ? ?Other Instructions ?N/A ?

## 2021-08-08 NOTE — Progress Notes (Signed)
Cardiology Office Note:    Date:  08/08/2021   ID:  Aaron [name], DOB 11/12/1965, MRN [id number]  PCP:  [place] Cardiologist:  Nelva Bush, MD  [place] Electrophysiologist:  None   Referring MD: [facility]   Chief Complaint: Zio follow-up  History of Present Illness:    Aaron [name] is a 56 y.o. male with a hx of  mild, nonobstructive CAD, nonischemic cardiomyopathy (LVEF 40% by echo 06/2020, HTN, kidney stones, morbid obesity, OSA and COVID-19 infection 05/2020 who presents for follow-up.    Saw EP prior for frequent PACs and 1st degree AV block. NO medication changes were made.    Last seen 02/09/21 and was feeling better. He had continued episodic chest and abdominal pain, recent GI illness, and planning to follow-up with GI. He was euvolemic on exam, no other changes were made.    ED visit for abdominal pain for kidney stone. GI planning EGD/colonoscopy. CT showed uncomplicated diverticulitis.   Last seen 06/11/20 and was still having abdominal issues with plan for EGD and colonoscopy. He reported increased heart rate after eating. Zio was ordered. Zio showed predominately NSR with occasional PACs and rare PVCs, occasional PSVT up to 15 seconds.   Today, the patient reports heart rate is still having elevated heart rates after eating. Seems to be worse after eating carbs or when he eats a lot. Heart monitor reviewed in detail. Does not appear to be on a PPI, says GI gave him something for GERD, but is unsure of the name. Heart rate today in the 70s. No chest pain or SOB. Says he is avoiding carbs and walking throughout the week.   Past Medical History:  Diagnosis Date   ADD (attention deficit disorder)    Chronic back pain    disc   Coronary artery disease    Crohn's disease ([place])    Heart murmur    HFrEF (heart failure with reduced ejection fraction) ([place])    Hypertension    Kidney stone    MDD (major  depressive disorder)    Obesity     Past Surgical History:  Procedure Laterality Date   CARDIAC CATHETERIZATION     COLONOSCOPY N/A 06/14/2021   Procedure: COLONOSCOPY;  Surgeon: Annamaria Helling, DO;  Location: [facility];  Service: Gastroenterology;  Laterality: N/A;   ESOPHAGOGASTRODUODENOSCOPY N/A 06/14/2021   Procedure: ESOPHAGOGASTRODUODENOSCOPY (EGD);  Surgeon: Annamaria Helling, DO;  Location: [facility] ENDOSCOPY;  Service: Gastroenterology;  Laterality: N/A;   KIDNEY STONE SURGERY     LEFT HEART CATH AND CORONARY ANGIOGRAPHY Left 06/27/2020   Procedure: LEFT HEART CATH AND CORONARY ANGIOGRAPHY;  Surgeon: Nelva Bush, MD;  Location: [facility];  Service: Cardiovascular;  Laterality: Left;   VASECTOMY      Current Medications: Current Meds  Medication Sig   acetaminophen (TYLENOL) 325 MG tablet Take 650-975 mg by mouth every 6 (six) hours as needed for moderate pain or headache (for pain.).   albuterol (VENTOLIN HFA) 108 (90 Base) MCG/ACT inhaler Inhale 2 puffs into the lungs every 6 (six) hours as needed for wheezing or shortness of breath.   cetirizine (ZYRTEC) 10 MG tablet Take 1 tablet (10 mg total) by mouth daily as needed (allergies.).   dicyclomine (BENTYL) 10 MG capsule Take 10 mg by mouth 4 (four) times daily -  before meals and at bedtime.   fluticasone (FLONASE) 50 MCG/ACT nasal spray Place 2 sprays into both nostrils daily as  needed (allergies.).   furosemide (LASIX) 40 MG tablet TAKE 1 TABLET BY MOUTH EVERY DAY   ibuprofen (ADVIL) 600 MG tablet Take 600 mg by mouth every 6 (six) hours as needed.   ipratropium (ATROVENT HFA) 17 MCG/ACT inhaler Inhale 2 puffs into the lungs every 6 (six) hours.   losartan (COZAAR) 100 MG tablet TAKE 1 TABLET BY MOUTH EVERY DAY   metoprolol succinate (TOPROL-XL) 50 MG 24 hr tablet TAKE 1 TABLET BY MOUTH DAILY. TAKE WITH OR IMMEDIATELY FOLLOWING A MEAL.   montelukast (SINGULAIR) 10 MG tablet Take 1 tablet (10 mg  total) by mouth at bedtime as needed (allergies.).   oxyCODONE-acetaminophen (PERCOCET/ROXICET) 5-325 MG tablet Take 1 tablet by mouth every 4 (four) hours as needed for severe pain.   Pancrelipase, Lip-Prot-Amyl, (ZENPEP) 40000-126000 units CPEP Take by mouth 3 (three) times daily.   pantoprazole (PROTONIX) 40 MG tablet Take 1 tablet (40 mg total) by mouth daily.   pregabalin (LYRICA) 100 MG capsule Take 100 mg by mouth at bedtime.   spironolactone (ALDACTONE) 50 MG tablet TAKE 1 TABLET BY MOUTH EVERY DAY   traMADol HCl 100 MG TABS Take 1 tablet by mouth 3 (three) times daily as needed.   Vitamin D, Ergocalciferol, (DRISDOL) 1.25 MG (50000 UNIT) CAPS capsule Take 1 capsule by mouth once a week.     Allergies:   Patient has no known allergies.   Social History   Socioeconomic History   Marital status: Widowed    Spouse name: carisa   Number of children: 2   Years of education: Not on file   Highest education level: High school graduate  Occupational History   Not on file  Tobacco Use   Smoking status: Never   Smokeless tobacco: Never  Vaping Use   Vaping Use: Never used  Substance and Sexual Activity   Alcohol use: Not Currently   Drug use: Never   Sexual activity: Not Currently  Other Topics Concern   Not on file  Social History Narrative   Not on file   Social Determinants of Health   Financial Resource Strain: Not on file  Food Insecurity: Not on file  Transportation Needs: Not on file  Physical Activity: Not on file  Stress: Not on file  Social Connections: Not on file     Family History: The patient's family history includes COPD in his father and mother; Cancer in his mother; Congestive Heart Failure in his father; Diabetes in his paternal grandfather; Healthy in his daughter and son; Heart attack in his paternal grandfather; Heart attack (age of onset: 56) in his father; Hypertension in his father and sister; Lung cancer in his father.  ROS:   Please see the  history of present illness.     All other systems reviewed and are negative.  EKGs/Labs/Other Studies Reviewed:    The following studies were reviewed today:  Heart monitor 06/2020 The patient was monitored for 19 days 1 hour; 12 days, 22 hours were adequate for analysis. Two separate monitors were worn. The predominant rhythm was sinus with an average rate of 62 bpm (range 34-136 bpm in sinus). There were occasional PAC's and rare PVC's. There were 13 atrial runs lasting up to 15.2 seconds with a maximum rate of 160 bpm. No sustained arrhythmia or prolonged pause was observed. Patient triggered events correspond to sinus rhythm, PAC's, PVC's, and PSVT.   Predominantly sinus rhythm with occasional PAC's and rare PVC's.  Occasional episodes of PSVT noted, lasting up to  15 seconds.  Echo 01/2021  1. Left ventricular ejection fraction, by estimation, is 55 to 60%. The  left ventricle has normal function. The left ventricle has no regional  wall motion abnormalities. Left ventricular diastolic parameters are  consistent with Grade I diastolic  dysfunction (impaired relaxation).   2. Right ventricular systolic function is normal. The right ventricular  size is normal.   3. The mitral valve is grossly normal. Mild mitral valve regurgitation.   4. The aortic valve was not well visualized. Aortic valve regurgitation  is not visualized.   5. Aortic dilatation noted. There is mild dilatation of the aortic root,  measuring 42 mm.   6. The inferior vena cava is normal in size with greater than 50%  respiratory variability, suggesting right atrial pressure of 3 mmHg.   EKG:  EKG is not ordered today.    Recent Labs: 03/16/2021: ALT 28 06/11/2021: BUN 14; Creatinine, Ser 1.00; Hemoglobin 16.4; Magnesium 2.2; Platelets 413; Potassium 4.1; Sodium 139; TSH 1.090  Recent Lipid Panel    Component Value Date/Time   CHOL 96 05/19/2020 0500   CHOL 117 11/17/2019 1003   TRIG 70 05/19/2020 0500   HDL  27 (L) 05/19/2020 0500   HDL 31 (L) 11/17/2019 1003   CHOLHDL 3.6 05/19/2020 0500   VLDL 14 05/19/2020 0500   LDLCALC 55 05/19/2020 0500   LDLCALC 64 11/17/2019 1003    Physical Exam:    VS:  BP 110/80 (BP Location: Left Arm, Patient Position: Sitting, Cuff Size: Large)    Pulse 76    Ht 5' 9"  (1.753 m)    Wt 292 lb 8 oz (132.7 kg)    SpO2 98%    BMI 43.19 kg/m     Wt Readings from Last 3 Encounters:  08/08/21 292 lb 8 oz (132.7 kg)  06/14/21 290 lb (131.5 kg)  06/11/21 291 lb (132 kg)     GEN:  Well nourished, well developed in no acute distress HEENT: Normal NECK: No JVD; No carotid bruits LYMPHATICS: No lymphadenopathy CARDIAC: RRR, no murmurs, rubs, gallops RESPIRATORY:  Clear to auscultation without rales, wheezing or rhonchi  ABDOMEN: Soft, non-tender, non-distended MUSCULOSKELETAL:  No edema; No deformity  SKIN: Warm and dry NEUROLOGIC:  Alert and oriented x 3 PSYCHIATRIC:  Normal affect   ASSESSMENT:    1. Palpitations   2. Nonischemic cardiomyopathy ([place])   3. Chronic HFrEF (heart failure with reduced ejection fraction) ([facility])    PLAN:    In order of problems listed above:  Palpitations H/o 1st degeee AV block and PACs Heart monitor with NSR and rare PACs/PVCs, 13 atrial runs longest 15 seconds. Reports persistently elevated heart rates after eating, worse after carbs or bigger meals. He is trying to avoid carbs and make some diet changes. I recommended exercise and encouraged diet changes. I will start him on PPI. Cannot uptitrate BB with h/o 1st degree AV block.   Chronic HFimpEF  NICM EF 55-60% He is euvolemic on exam today. Continue Toprol 66m daily, Losartan 555mdaily, spironolactone 5059maily and lasix 92m16mily.   Disposition: Follow up in 3 month(s) with MD/APP    Signed, Zakyla Tonche H FuNinfa Meeker-C  08/08/2021 4:09 PM    [facility]

## 2021-08-22 ENCOUNTER — Other Ambulatory Visit: Payer: Self-pay | Admitting: Gastroenterology

## 2021-08-22 DIAGNOSIS — R6881 Early satiety: Secondary | ICD-10-CM

## 2021-08-22 DIAGNOSIS — R110 Nausea: Secondary | ICD-10-CM

## 2021-08-23 IMAGING — CR DG CHEST 2V
2 series · 2 of 2 positions shown · non-contrast
Comparison: 07/17/2020

CLINICAL DATA: Abdominal pain, palpitations

EXAM:
CHEST - 2 VIEW

[chest pa]
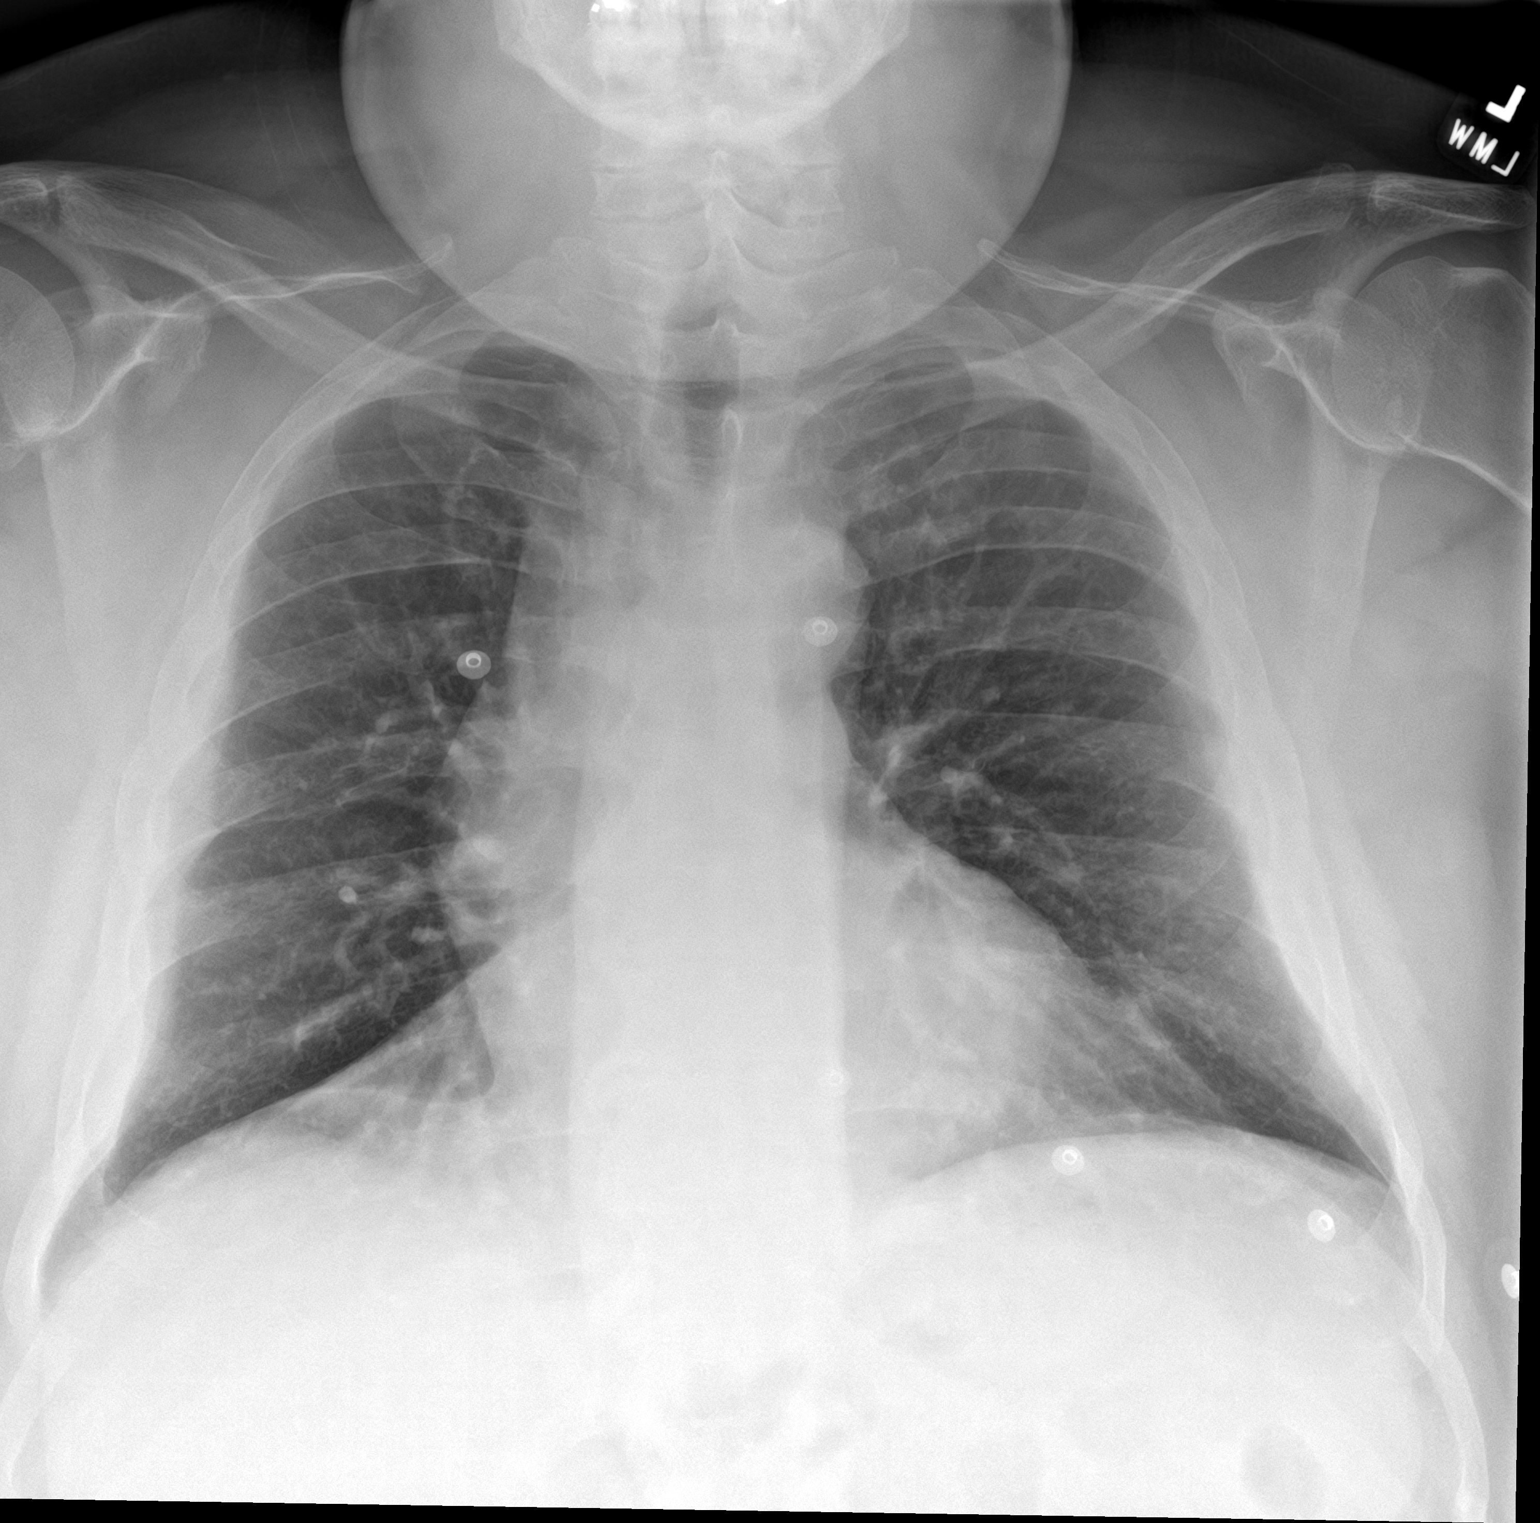

[chest lat]
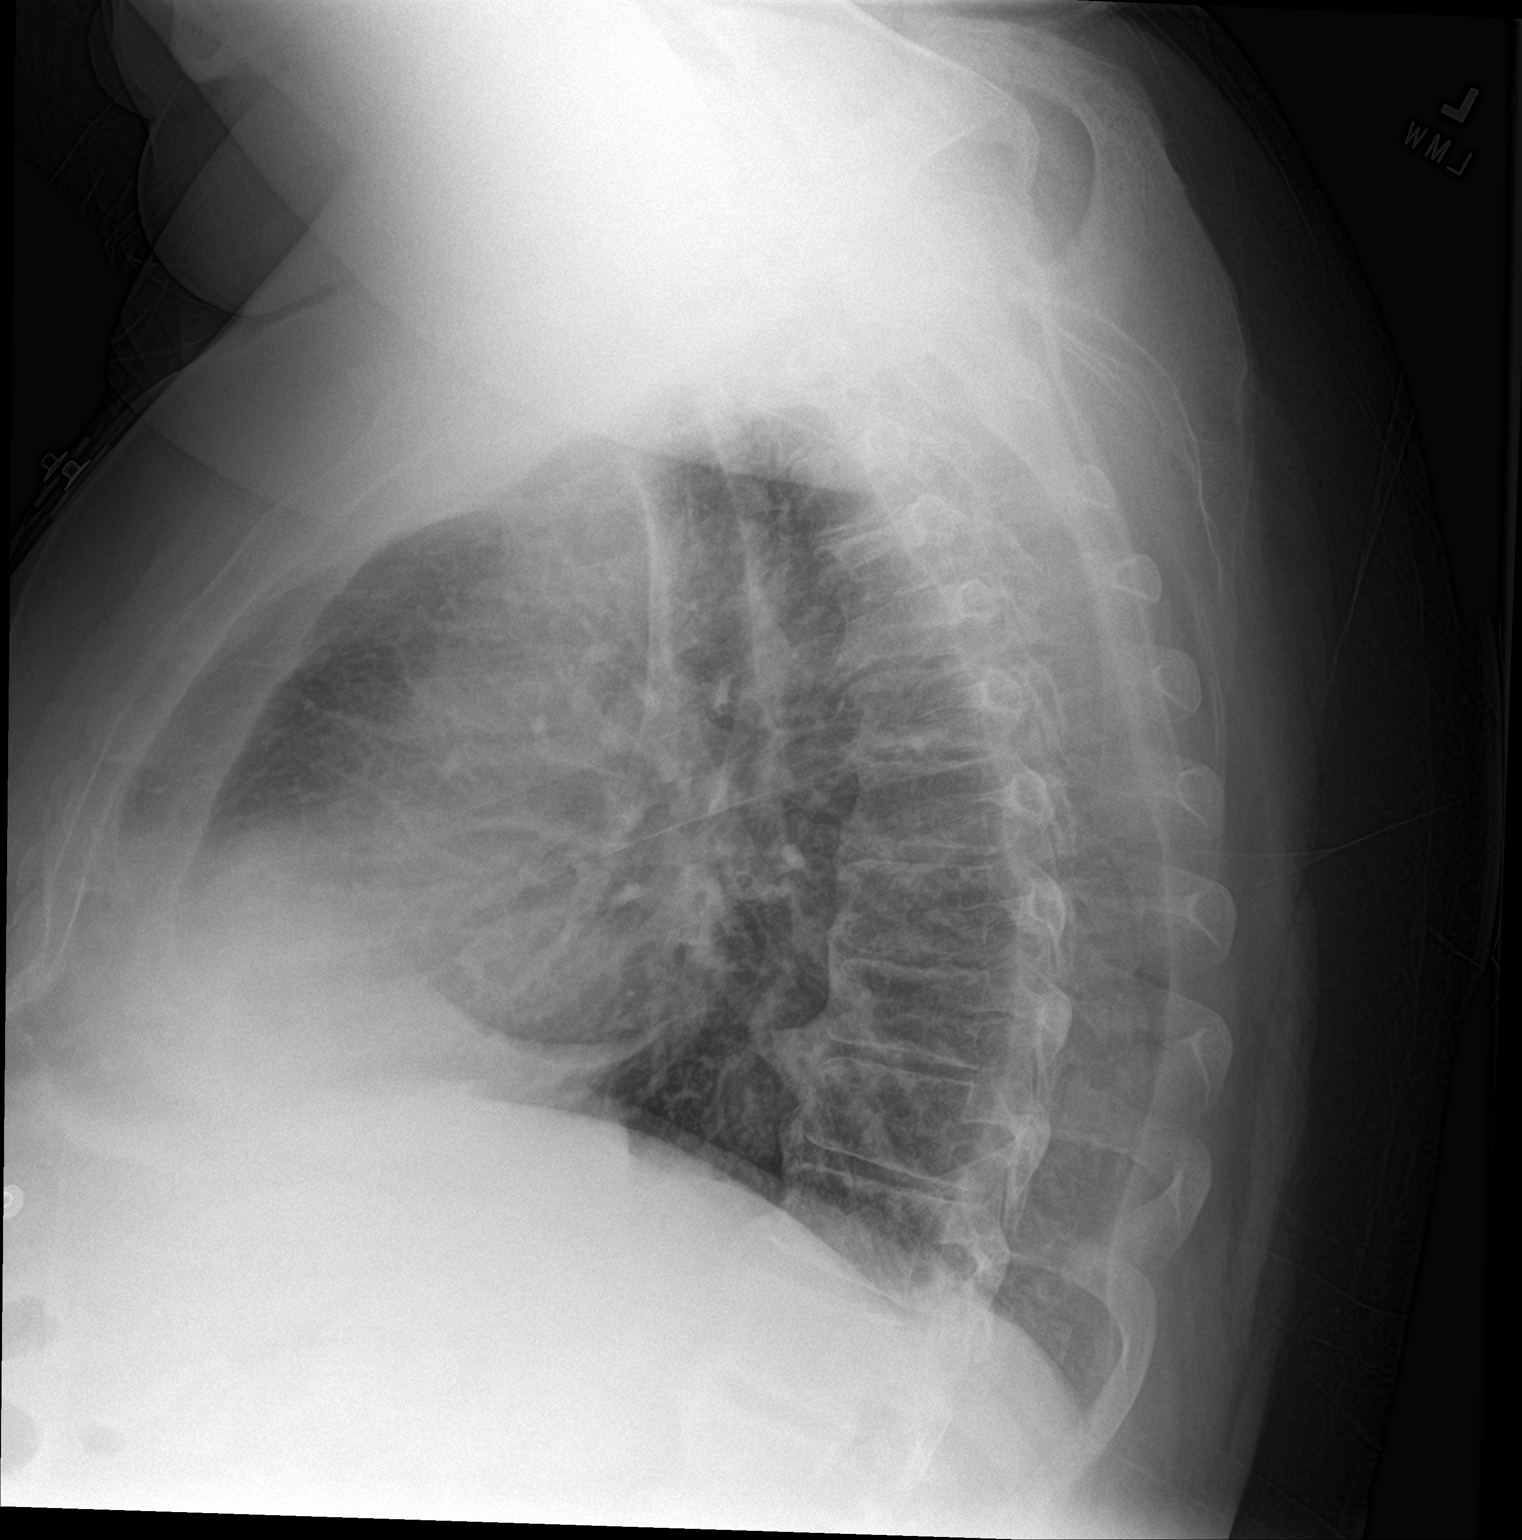

[2 of 2 positions shown; findings below may reference images not displayed]

FINDINGS: Lungs are clear.  No pleural effusion or pneumothorax.

The heart is normal in size.

Degenerative changes of the visualized thoracolumbar spine.
IMPRESSION: Normal chest radiographs.

## 2021-08-31 ENCOUNTER — Other Ambulatory Visit: Payer: Self-pay | Admitting: Internal Medicine

## 2021-09-14 ENCOUNTER — Ambulatory Visit
Admission: RE | Admit: 2021-09-14 | Discharge: 2021-09-14 | Disposition: A | Discharge status: Home / Self Care | Payer: Medicare Other | Source: Ambulatory Visit | Attending: Gastroenterology | Admitting: Gastroenterology

## 2021-09-14 DIAGNOSIS — R6881 Early satiety: Secondary | ICD-10-CM

## 2021-09-14 DIAGNOSIS — R110 Nausea: Secondary | ICD-10-CM | POA: Diagnosis present

## 2021-09-14 MED ORDER — TECHNETIUM TC 99M SULFUR COLLOID: 2.0000 | Freq: Once | INTRAVENOUS | Status: DC | PRN

## 2021-09-22 ENCOUNTER — Emergency Department: Payer: Medicare Other

## 2021-09-22 ENCOUNTER — Other Ambulatory Visit: Payer: Self-pay

## 2021-09-22 ENCOUNTER — Telehealth: Payer: Self-pay | Admitting: Cardiology

## 2021-09-22 ENCOUNTER — Emergency Department
Admission: EM | Admit: 2021-09-22 | Discharge: 2021-09-22 | Disposition: A | Discharge status: Home / Self Care | Payer: Medicare Other | Attending: Emergency Medicine | Admitting: Emergency Medicine

## 2021-09-22 DIAGNOSIS — R000 Tachycardia, unspecified: Secondary | ICD-10-CM | POA: Insufficient documentation

## 2021-09-22 DIAGNOSIS — I110 Hypertensive heart disease with heart failure: Secondary | ICD-10-CM | POA: Diagnosis not present

## 2021-09-22 DIAGNOSIS — R7989 Other specified abnormal findings of blood chemistry: Secondary | ICD-10-CM | POA: Diagnosis not present

## 2021-09-22 DIAGNOSIS — R002 Palpitations: Secondary | ICD-10-CM | POA: Diagnosis present

## 2021-09-22 DIAGNOSIS — I509 Heart failure, unspecified: Secondary | ICD-10-CM | POA: Insufficient documentation

## 2021-09-22 DIAGNOSIS — R0600 Dyspnea, unspecified: Secondary | ICD-10-CM | POA: Insufficient documentation

## 2021-09-22 DIAGNOSIS — I2510 Atherosclerotic heart disease of native coronary artery without angina pectoris: Secondary | ICD-10-CM | POA: Diagnosis not present

## 2021-09-22 DIAGNOSIS — R079 Chest pain, unspecified: Secondary | ICD-10-CM | POA: Insufficient documentation

## 2021-09-22 LAB — COMPREHENSIVE METABOLIC PANEL
ALT: 32 U/L (ref 0–44)
AST: 33 U/L (ref 15–41)
Albumin: 3.9 g/dL (ref 3.5–5.0)
Alkaline Phosphatase: 81 U/L (ref 38–126)
Anion gap: 11 (ref 5–15)
BUN: 20 mg/dL (ref 6–20)
CO2: 24 mmol/L (ref 22–32)
Calcium: 9 mg/dL (ref 8.9–10.3)
Chloride: 103 mmol/L (ref 98–111)
Creatinine, Ser: 1.2 mg/dL (ref 0.61–1.24)
GFR, Estimated: >60 mL/min (ref >60)
Glucose, Bld: 138 mg/dL — ABNORMAL HIGH (ref 70–99)
Potassium: 4.1 mmol/L (ref 3.5–5.1)
Sodium: 138 mmol/L (ref 135–145)
Total Bilirubin: 0.7 mg/dL (ref 0.3–1.2)
Total Protein: 8.1 g/dL (ref 6.5–8.1)

## 2021-09-22 LAB — CBC WITH DIFFERENTIAL/PLATELET
Abs Immature Granulocytes: 0.04 10*3/uL (ref 0.00–0.07)
Basophils Absolute: 0.1 10*3/uL (ref 0.0–0.1)
Basophils Relative: 1 %
Eosinophils Absolute: 0.2 10*3/uL (ref 0.0–0.5)
Eosinophils Relative: 1 %
HCT: 50.4 % (ref 39.0–52.0)
Hemoglobin: 16.6 g/dL (ref 13.0–17.0)
Immature Granulocytes: 0 %
Lymphocytes Relative: 28 %
Lymphs Abs: 3.7 10*3/uL (ref 0.7–4.0)
MCH: 29.6 pg (ref 26.0–34.0)
MCHC: 32.9 g/dL (ref 30.0–36.0)
MCV: 90 fL (ref 80.0–100.0)
Monocytes Absolute: 1 10*3/uL (ref 0.1–1.0)
Monocytes Relative: 8 %
Neutro Abs: 8.2 10*3/uL — ABNORMAL HIGH (ref 1.7–7.7)
Neutrophils Relative %: 62 %
Platelets: 373 10*3/uL (ref 150–400)
RBC: 5.6 MIL/uL (ref 4.22–5.81)
RDW: 12.2 % (ref 11.5–15.5)
WBC: 13.1 10*3/uL — ABNORMAL HIGH (ref 4.0–10.5)
nRBC: 0 % (ref 0.0–0.2)

## 2021-09-22 LAB — MAGNESIUM: Magnesium: 2.3 mg/dL (ref 1.7–2.4)

## 2021-09-22 LAB — TSH: TSH: 0.609 u[IU]/mL (ref 0.350–4.500)

## 2021-09-22 LAB — TROPONIN I (HIGH SENSITIVITY): Troponin I (High Sensitivity): 18 ng/L — ABNORMAL HIGH (ref <18)

## 2021-09-22 MED ORDER — METOPROLOL TARTRATE 5 MG/5ML IV SOLN
5.0000 mg | INTRAVENOUS | Status: DC | PRN
  Administered 2021-09-22 (×2): 5 mg via INTRAVENOUS
  Filled 2021-09-22 (×2): qty 5

## 2021-09-22 NOTE — ED Triage Notes (Signed)
Reports "high heart rate" ~150bpm felt light headed and was having LUQ abdominal tightness beginning about 3 hors pta.  ?

## 2021-09-22 NOTE — ED Triage Notes (Signed)
FIRST NURSE NOTE:  Pt to registration desk wearing personal portable pulse ox states his heart rate has been in the 150s and has L chest pain. Pt was referred to ED by cardiology office.  ?

## 2021-09-22 NOTE — ED Provider Notes (Signed)
? ?[facility] ?Provider Note ? ? ? Event Date/Time  ? First MD Initiated Contact with Patient 09/22/21 2054   ?  (approximate) ? ? ?History  ? ?Chief Complaint ?Tachycardia ? ? ?HPI ? ?Aaron [name] is a 56 y.o. male with past medical history of hypertension, CAD, CHF who presents to the ED complaining of palpitations.  Patient reports that around 6:00 he had gotten up to get something to drink at home when he suddenly started to feel like his heart was racing.  When he checked his heart rate on his home pulse ox, it consistently measured at around 150.  He reports a dull aching discomfort in the left lower part of his chest along with some mild difficulty breathing.  He states he has dealt with similar palpitations in the past but they usually go away on their own after a few minutes.  He denies any history of abnormal heart rhythms such as atrial fibrillation or atrial flutter.  He has been feeling well recently with no fevers, cough, nausea, or vomiting. ?  ? ? ?Physical Exam  ? ?Triage Vital Signs: ?ED Triage Vitals [09/22/21 2054]  ?Enc Vitals Group  ?   BP   ?   Pulse   ?   Resp   ?   Temp   ?   Temp src   ?   SpO2   ?   Weight 295 lb (133.8 kg)  ?   Height 5' 9"  (1.753 m)  ?   Head Circumference   ?   Peak Flow   ?   Pain Score 0  ?   Pain Loc   ?   Pain Edu?   ?   Excl. in [place]?   ? ? ?Most recent vital signs: ?Vitals:  ? 09/22/21 2220 09/22/21 2223  ?BP: (!) 128/99 (!) 128/99  ?Pulse: 79 78  ?Resp: (!) 23 20  ?Temp:    ?SpO2: 98% 96%  ? ? ?Constitutional: Alert and oriented. ?Eyes: Conjunctivae are normal. ?Head: Atraumatic. ?Nose: No congestion/rhinnorhea. ?Mouth/Throat: Mucous membranes are moist.  ?Cardiovascular: Tachycardic, regular rhythm. Grossly normal heart sounds.  2+ radial pulses bilaterally. ?Respiratory: Normal respiratory effort.  No retractions. Lungs CTAB. ?Gastrointestinal: Soft and nontender. No distention. ?Musculoskeletal: No lower extremity tenderness nor edema.   ?Neurologic:  Normal speech and language. No gross focal neurologic deficits are appreciated. ? ? ? ?ED Results / Procedures / Treatments  ? ?Labs ?(all labs ordered are listed, but only abnormal results are displayed) ?Labs Reviewed  ?CBC WITH DIFFERENTIAL/PLATELET - Abnormal; Notable for the following components:  ?    Result Value  ? WBC 13.1 (*)   ? Neutro Abs 8.2 (*)   ? All other components within normal limits  ?COMPREHENSIVE METABOLIC PANEL - Abnormal; Notable for the following components:  ? Glucose, Bld 138 (*)   ? All other components within normal limits  ?TROPONIN I (HIGH SENSITIVITY) - Abnormal; Notable for the following components:  ? Troponin I (High Sensitivity) 18 (*)   ? All other components within normal limits  ?TSH  ?MAGNESIUM  ? ? ? ?EKG ? ?ED ECG REPORT ?Tempie Hoist, the attending physician, personally viewed and interpreted this ECG. ? ? Date: 09/22/2021 ? EKG Time: 20:45 ? Rate: 137 ? Rhythm: Atrial flutter vs SVT ? Axis: Normal ? Intervals:none ? ST&T Change: None ? ?ED ECG REPORT ?Tempie Hoist, the attending physician, personally viewed and interpreted this ECG. ? ? Date:  09/22/2021 ? EKG Time: 21:46 ? Rate: 126 ? Rhythm: Sinus tachycardia vs Atrial flutter ? Axis: Normal ? Intervals:none ? ST&T Change: None ? ?ED ECG REPORT ?Tempie Hoist, the attending physician, personally viewed and interpreted this ECG. ? ? Date: 09/22/2021 ? EKG Time: 22:23 ? Rate: 78 ? Rhythm: normal sinus rhythm ? Axis: None ? Intervals:first-degree A-V block  ? ST&T Change: None ? ? ?RADIOLOGY ?Chest x-ray reviewed by me with no infiltrate, edema, or effusion. ? ?PROCEDURES: ? ?Critical Care performed: Yes, see critical care procedure note(s) ? ?.Critical Care ?Performed by: Blake Divine, MD ?Authorized by: Blake Divine, MD  ? ?Critical care provider statement:  ?  Critical care time (minutes):  30 ?  Critical care time was exclusive of:  Separately billable procedures and treating other  patients and teaching time ?  Critical care was necessary to treat or prevent imminent or life-threatening deterioration of the following conditions:  Cardiac failure ?  Critical care was time spent personally by me on the following activities:  Development of treatment plan with patient or surrogate, discussions with consultants, evaluation of patient's response to treatment, examination of patient, ordering and review of laboratory studies, ordering and review of radiographic studies, ordering and performing treatments and interventions, pulse oximetry, re-evaluation of patient's condition and review of old charts ?  I assumed direction of critical care for this patient from another provider in my specialty: no   ?  Care discussed with: admitting provider   ?.1-3 Lead EKG Interpretation ?Performed by: Blake Divine, MD ?Authorized by: Blake Divine, MD  ? ?  Interpretation: abnormal   ?  ECG rate:  135 ?  ECG rate assessment: tachycardic   ?  Rhythm: atrial flutter   ?  Ectopy: none   ?  Conduction: normal   ? ? ?MEDICATIONS ORDERED IN ED: ?Medications  ?metoprolol tartrate (LOPRESSOR) injection 5 mg (5 mg Intravenous Given 09/22/21 2149)  ? ? ? ?IMPRESSION / MDM / ASSESSMENT AND PLAN / ED COURSE  ?I reviewed the triage vital signs and the nursing notes. ?             ?               ? ?56 y.o. male with past medical history of hypertension, CAD, and CHF who presents to the ED complaining of sudden onset of palpitations around 6:00 this evening along with dull aching pain in the left lower part of his chest with some mild difficulty breathing. ? ?Differential diagnosis includes, but is not limited to, atrial fibrillation, atrial flutter, SVT, ACS, PE, pneumonia, electrolyte abnormality. ? ?Patient nontoxic-appearing and in no acute distress, vitals remarkable for persistent tachycardia with stable blood pressure.  Patient's heart rate is consistently right around 135 and initial EKG is concerning for atrial  flutter with 2-1 block, no ischemic changes noted.  We will attempt to control rate with IV metoprolol given his history of CHF, screen labs and chest x-ray in the meantime. ? ?Labs are reassuring with mild leukocytosis and no anemia, no electrolyte abnormality or AKI noted.  Troponin mildly elevated but I doubt ACS at this time.  Patient with minimal improvement in heart rate following first dose of IV metoprolol.  Repeat EKG was discussed with Dr. Harrell Gave of cardiology, who recommends additional IV metoprolol and adenosine if patient were to become unstable.  Following additional 5 mg IV metoprolol, patient appears to have converted to normal sinus rhythm with heart rate in the 70s,  now states chest pain and shortness of breath have resolved.  Dr. Harrell Gave cardiology agrees with plan for close outpatient follow-up with cardiology, no changes needed to his metoprolol regimen at this time.  He was counseled to return to the ED for new worsening symptoms, patient agrees with plan. ? ?The patient is on the cardiac monitor to evaluate for evidence of arrhythmia and/or significant heart rate changes. ? ?  ? ? ?FINAL CLINICAL IMPRESSION(S) / ED DIAGNOSES  ? ?Final diagnoses:  ?Palpitations  ?Tachycardia  ? ? ? ?Rx / DC Orders  ? ?ED Discharge Orders   ? ? None  ? ?  ? ? ? ?Note:  This document was prepared using Dragon voice recognition software and may include unintentional dictation errors. ?  ?Blake Divine, MD ?09/22/21 2319 ? ?

## 2021-09-22 NOTE — Telephone Encounter (Signed)
Patient calling and reporting that he came back from work and was sitting in the couch- suddenly felt heart palpitations- gone upto 160s and then felt near syncope. The HR slowed down to 120s but now back again in 150s. He is feeling a bit better but still has persisent palpitations  ? - his office visit on 3/8 shows he has 13 runs of atrial tachycardia, PACs, 1st degree AV block. He has h/o chronic HFimpEF, 55% and is on gdmt- toprol 18m daily ? ?I suggested him to go to the nearest ER and get full set of labs, EKG and check what rhythm he is in so that we can treat him appropriately. He will go to the ER soon with someone driving him their. ? ?RRenae Fickle MD ?Cardiology coverage ?

## 2021-09-25 NOTE — Progress Notes (Deleted)
09/26/2021 12:25 PM   [name] [birth year] [id number]  Referring provider: [facility] [address] [place],  [place] [address]  No chief complaint on file.  Urological history: 1. BPH with LU TS -PSA 0.8 in 02/2021  2. Nephrolithiasis -surgery for kidney stones about 20 years ago -contrast CT 05/2021 - 3 mm nonobstructive right renal upper pole stone  3. Intermediate Risk Hematuria -non-smoker -CT abdomen and pelvis noncontrast on 01/02/2021 that revealed an enlarged prostate gland measuring 5.0 cm in transverse diameter with focal calcifications. It also revealed 5 mm proximal left ureteral calculus without significant ureterectasis or pyelocaliectasis, a 5 mm nonobstructive right upper pole renal calculus and a 7.25 cm exophytic left upper pole renal cyst -contrast CT 05/2021 - left adrenal adenoma, renal cysts and small right renal stone -no reports of gross heme  -UA ***  4. Bilateral renal cysts - seen on previous imaging, stable and benign; no further evaluation needed.  - MRI in 2019 for further characterization was benign  -contrast CT 05/2021 - there is a 7.5 cm cyst in the left kidney and a small right renal inferior pole hypodense lesion is too small to characterize but likely a cyst  5. 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 -contrast CT 05/2021 - 14 mm left adrenal adenoma   HPI: Aaron [name] is a 56 y.o. male who presents today for a 6 month follow up.   PMH: Past Medical History:  Diagnosis Date   ADD (attention deficit disorder)    Chronic back pain    disc   Coronary artery disease    Crohn's disease ([place])    Heart murmur    HFrEF (heart failure with reduced ejection fraction) ([place])    Hypertension    Kidney stone    MDD (major depressive disorder)    Obesity     Surgical History: Past Surgical History:  Procedure Laterality Date   CARDIAC CATHETERIZATION      COLONOSCOPY N/A 06/14/2021   Procedure: COLONOSCOPY;  Surgeon: Annamaria Helling, DO;  Location: [facility] ENDOSCOPY;  Service: Gastroenterology;  Laterality: N/A;   ESOPHAGOGASTRODUODENOSCOPY N/A 06/14/2021   Procedure: ESOPHAGOGASTRODUODENOSCOPY (EGD);  Surgeon: Annamaria Helling, DO;  Location: [facility] ENDOSCOPY;  Service: Gastroenterology;  Laterality: N/A;   KIDNEY STONE SURGERY     LEFT HEART CATH AND CORONARY ANGIOGRAPHY Left 06/27/2020   Procedure: LEFT HEART CATH AND CORONARY ANGIOGRAPHY;  Surgeon: Nelva Bush, MD;  Location: [place] [facility];  Service: Cardiovascular;  Laterality: Left;   VASECTOMY      Home Medications:  Allergies as of 09/26/2021   No Known Allergies      Medication List        Accurate as of September 25, 2021 12:25 PM. If you have any questions, ask your nurse or doctor.          acetaminophen 325 MG tablet Commonly known as: TYLENOL Take 650-975 mg by mouth every 6 (six) hours as needed for moderate pain or headache (for pain.).   albuterol 108 (90 Base) MCG/ACT inhaler Commonly known as: VENTOLIN HFA Inhale 2 puffs into the lungs every 6 (six) hours as needed for wheezing or shortness of breath.   cetirizine 10 MG tablet Commonly known as: ZYRTEC Take 1 tablet (10 mg total) by mouth daily as needed (allergies.).   dicyclomine 10 MG capsule Commonly known as: BENTYL Take 10 mg by mouth 4 (four) times daily -  before  meals and at bedtime.   fluticasone 50 MCG/ACT nasal spray Commonly known as: FLONASE Place 2 sprays into both nostrils daily as needed (allergies.).   furosemide 40 MG tablet Commonly known as: LASIX TAKE 1 TABLET BY MOUTH EVERY DAY   ibuprofen 600 MG tablet Commonly known as: ADVIL Take 600 mg by mouth every 6 (six) hours as needed.   ipratropium 17 MCG/ACT inhaler Commonly known as: ATROVENT HFA Inhale 2 puffs into the lungs every 6 (six) hours.   losartan 100 MG tablet Commonly known as: COZAAR TAKE 1 TABLET  BY MOUTH EVERY DAY   metoprolol succinate 50 MG 24 hr tablet Commonly known as: TOPROL-XL TAKE 1 TABLET BY MOUTH DAILY. TAKE WITH OR IMMEDIATELY FOLLOWING A MEAL.   montelukast 10 MG tablet Commonly known as: SINGULAIR Take 1 tablet (10 mg total) by mouth at bedtime as needed (allergies.).   oxyCODONE-acetaminophen 5-325 MG tablet Commonly known as: PERCOCET/ROXICET Take 1 tablet by mouth every 4 (four) hours as needed for severe pain.   pantoprazole 40 MG tablet Commonly known as: PROTONIX Take 1 tablet (40 mg total) by mouth daily.   pregabalin 100 MG capsule Commonly known as: LYRICA Take 100 mg by mouth at bedtime.   spironolactone 50 MG tablet Commonly known as: ALDACTONE TAKE 1 TABLET BY MOUTH EVERY DAY   traMADol HCl 100 MG Tabs Take 1 tablet by mouth 3 (three) times daily as needed.   Vitamin D (Ergocalciferol) 1.25 MG (50000 UNIT) Caps capsule Commonly known as: DRISDOL Take 1 capsule by mouth once a week.   Zenpep 40000-126000 units Cpep Generic drug: Pancrelipase (Lip-Prot-Amyl) Take by mouth 3 (three) times daily.        Allergies: No Known Allergies  Family History: Family History  Problem Relation Age of Onset   COPD Mother    Cancer Mother    Hypertension Father    Congestive Heart Failure Father    COPD Father    Lung cancer Father    Heart attack Father 52   Hypertension Sister    Diabetes Paternal Grandfather    Heart attack Paternal Grandfather    Healthy Son    Healthy Daughter     Social History:  reports that he has never smoked. He has never used smokeless tobacco. He reports that he does not currently use alcohol. He reports that he does not use drugs.  ROS: Pertinent ROS in HPI  Physical Exam: There were no vitals taken for this visit.  Constitutional:  Well nourished. Alert and oriented, No acute distress. HEENT: [place] AT, moist mucus membranes.  Trachea midline Cardiovascular: No clubbing, cyanosis, or edema. Respiratory:  Normal respiratory effort, no increased work of breathing. GU: No CVA tenderness.  No bladder fullness or masses.  Patient with circumcised/uncircumcised phallus. ***Foreskin easily retracted***  Urethral meatus is patent.  No penile discharge. No penile lesions or rashes. Scrotum without lesions, cysts, rashes and/or edema.  Testicles are located scrotally bilaterally. No masses are appreciated in the testicles. Left and right epididymis are normal. Rectal: Patient with  normal sphincter tone. Anus and perineum without scarring or rashes. No rectal masses are appreciated. Prostate is approximately *** grams, *** nodules are appreciated. Seminal vesicles are normal. Neurologic: Grossly intact, no focal deficits, moving all 4 extremities. Psychiatric: Normal mood and affect.   Laboratory Data: Component     Latest Ref Rng 09/22/2021  TSH     0.350 - 4.500 uIU/mL 0.609       Latest Ref  Rng & Units 09/22/2021    9:00 PM 06/11/2021    4:00 PM 05/07/2021   11:36 AM  CMP  Glucose 70 - 99 mg/dL 138   89     BUN 6 - 20 mg/dL 20   14     Creatinine 0.61 - 1.24 mg/dL 1.20   1.00   1.10    Sodium 135 - 145 mmol/L 138   139     Potassium 3.5 - 5.1 mmol/L 4.1   4.1     Chloride 98 - 111 mmol/L 103   102     CO2 22 - 32 mmol/L 24   20     Calcium 8.9 - 10.3 mg/dL 9.0   10.0     Total Protein 6.5 - 8.1 g/dL 8.1      Total Bilirubin 0.3 - 1.2 mg/dL 0.7      Alkaline Phos 38 - 126 U/L 81      AST 15 - 41 U/L 33      ALT 0 - 44 U/L 32           Latest Ref Rng & Units 09/22/2021    9:00 PM 06/11/2021    4:00 PM 03/16/2021    5:22 PM  CBC  WBC 4.0 - 10.5 K/uL 13.1   10.0   11.3    Hemoglobin 13.0 - 17.0 g/dL 16.6   16.4   16.6    Hematocrit 39.0 - 52.0 % 50.4   48.1   48.8    Platelets 150 - 400 K/uL 373   413   392     Urinalysis *** I have reviewed the labs.   Pertinent Imaging:  CLINICAL DATA:  Abdominal pain.   EXAM: CT ABDOMEN AND PELVIS WITH CONTRAST   TECHNIQUE: Multidetector CT  imaging of the abdomen and pelvis was performed using the standard protocol following bolus administration of intravenous contrast.   CONTRAST:  126m OMNIPAQUE IOHEXOL 350 MG/ML SOLN   COMPARISON:  CT abdomen pelvis dated 03/16/2021.   FINDINGS: Lower chest: The visualized lung bases are clear.   No intra-abdominal free air or free fluid.   Hepatobiliary: No focal liver abnormality is seen. No gallstones, gallbladder wall thickening, or biliary dilatation.   Pancreas: Unremarkable. No pancreatic ductal dilatation or surrounding inflammatory changes.   Spleen: Normal in size without focal abnormality.   Adrenals/Urinary Tract: There is a 14 mm left adrenal adenoma. The right adrenal gland is unremarkable. There is a 7.5 cm cyst in the left kidney. There is a 3 mm nonobstructive right renal upper pole stone. There is no hydronephrosis on either side. There is symmetric enhancement and excretion of contrast by both kidneys. A small right renal inferior pole hypodense lesion is too small to characterize but likely a cyst. The visualized ureters and urinary bladder appear unremarkable.   Stomach/Bowel: There is sigmoid diverticulosis without active inflammatory changes. There is no bowel obstruction or active inflammation. The appendix is normal.   Vascular/Lymphatic: The abdominal aorta and IVC are unremarkable. No portal venous gas. There is no adenopathy.   Reproductive: The prostate and seminal vesicles are grossly unremarkable. No pelvic mass.   Other: Small fat containing bilateral inguinal hernias.   Musculoskeletal: Mild degenerative changes of the spine. No acute osseous pathology.   IMPRESSION: 1. No acute intra-abdominal or pelvic pathology. 2. Sigmoid diverticulosis. No bowel obstruction. Normal appendix. 3. A 3 mm nonobstructive right renal upper pole stone. No hydronephrosis.     Electronically  Signed   By: Anner Crete M.D.   On: 05/08/2021  01:58 I have independently reviewed the films.  See HPI.   Assessment & Plan:    1. Nephrolithiasis -no stones seen on KUB -RUS to rule out hydronephrosis  2. Microscopic hematuria -resolved   No follow-ups on file.  These notes generated with voice recognition software. I apologize for typographical errors.  Zara Council, PA-C  [facility] [address]  [place], [place] [address] [phone]

## 2021-09-26 ENCOUNTER — Ambulatory Visit: Payer: Medicare Other | Admitting: Urology

## 2021-09-26 DIAGNOSIS — N400 Enlarged prostate without lower urinary tract symptoms: Secondary | ICD-10-CM

## 2021-09-26 DIAGNOSIS — N200 Calculus of kidney: Secondary | ICD-10-CM

## 2021-09-28 ENCOUNTER — Encounter: Payer: Self-pay | Admitting: Internal Medicine

## 2021-09-28 ENCOUNTER — Ambulatory Visit (INDEPENDENT_AMBULATORY_CARE_PROVIDER_SITE_OTHER): Payer: Medicare Other | Admitting: Internal Medicine

## 2021-09-28 VITALS — BP 110/90 | HR 75 | Ht 69.0 in | Wt 295.0 lb

## 2021-09-28 DIAGNOSIS — R002 Palpitations: Secondary | ICD-10-CM | POA: Diagnosis not present

## 2021-09-28 DIAGNOSIS — I428 Other cardiomyopathies: Secondary | ICD-10-CM

## 2021-09-28 DIAGNOSIS — I5022 Chronic systolic (congestive) heart failure: Secondary | ICD-10-CM

## 2021-09-28 DIAGNOSIS — I2510 Atherosclerotic heart disease of native coronary artery without angina pectoris: Secondary | ICD-10-CM

## 2021-09-28 DIAGNOSIS — I10 Essential (primary) hypertension: Secondary | ICD-10-CM

## 2021-09-28 DIAGNOSIS — I471 Supraventricular tachycardia: Secondary | ICD-10-CM | POA: Diagnosis not present

## 2021-09-28 NOTE — Progress Notes (Signed)
? ?Follow-up Outpatient Visit ?Date: 09/28/2021 ? ?Primary Care Provider: ?[facility] ?[place] ?[place] [address] ? ?Chief Complaint: Follow-up ED visit for palpitations ? ?HPI:  Aaron [name] is a 56 y.o. male with history of mild, nonobstructive CAD, nonischemic cardiomyopathy (LVEF ~40% by left ventriculogram in 06/2020), HTN, kidney stones, morbid obesity, obstructive sleep apnea, and COVID-19 infection (05/2020), who presents for evaluation of palpitations and follow-up of nonischemic cardiomyopathy.  Aaron [name] was last seen in our office in early March by Cadence Furth, [place], at which time he reported continued elevated heart rates after eating.  He had an episode of sudden palpitations and lightheadedness while at rest last week, prompting referral to the emergency department.  EKG was suspicious for atrial flutter with 2:1 AV block.  He converted to sinus rhythm with IV metoprolol.  Previous event monitor had shown sinus rhythm with occasional PACs and rare PVCs as well as occasional PSVT lasting up to 15 seconds. ? ?Aaron [name] reports that he was in his usual state of health until the afternoon of 09/22/2021, when he suddenly began to feel his heart race when he got up to get something to drink.  He checked his HR with a pulseoximeter and found it to be 150-160 bpm.  The palpitations were associated with lightheadedness and shortness of breath, as well as left lower chest/upper abdominal pain.  He continued to monitor his HR and found it to fluctuate between 120-140 bpm.  He eventually contacted our on-call provider, who recommended that Aaron [name] proceed to the ED for further evaluation.  There, EKG's were concerning for atrial flutter.  He was given 2 doses of IV metoprolol and suddenly felt his HR slow down to around 60 bpm.  He has not had any further palpitations.  He reports having been compliant with his medications.  There was nothing out of the ordinary on 4/22 that  he recalls to explain this episode. ? ?-------------------------------------------------------------------------------------------------- ? ?Past Medical History:  ?Diagnosis Date  ? ADD (attention deficit disorder)   ? Chronic back pain   ? disc  ? Coronary artery disease   ? Crohn's disease ([place])   ? Heart murmur   ? HFrEF (heart failure with reduced ejection fraction) ([place])   ? Hypertension   ? Kidney stone   ? MDD (major depressive disorder)   ? Obesity   ? ?Past Surgical History:  ?Procedure Laterality Date  ? CARDIAC CATHETERIZATION    ? COLONOSCOPY N/A 06/14/2021  ? Procedure: COLONOSCOPY;  Surgeon: Annamaria Helling, DO;  Location: [facility] ENDOSCOPY;  Service: Gastroenterology;  Laterality: N/A;  ? ESOPHAGOGASTRODUODENOSCOPY N/A 06/14/2021  ? Procedure: ESOPHAGOGASTRODUODENOSCOPY (EGD);  Surgeon: Annamaria Helling, DO;  Location[facility] ENDOSCOPY;  Service: Gastroenterology;  Laterality: N/A;  ? KIDNEY STONE SURGERY    ? LEFT HEART CATH AND CORONARY ANGIOGRAPHY Left 06/27/2020  ? Procedure: LEFT HEART CATH AND CORONARY ANGIOGRAPHY;  Surgeon: Nelva Bush, MD;  Location: [place] [facility] LAB;  Service: Cardiovascular;  Laterality: Left;  ? VASECTOMY    ? ? ?Current Meds  ?Medication Sig  ? acetaminophen (TYLENOL) 325 MG tablet Take 650-975 mg by mouth every 6 (six) hours as needed for moderate pain or headache (for pain.).  ? albuterol (VENTOLIN HFA) 108 (90 Base) MCG/ACT inhaler Inhale 2 puffs into the lungs every 6 (six) hours as needed for wheezing or shortness of breath.  ? cetirizine (ZYRTEC) 10 MG tablet Take 1 tablet (10 mg total) by mouth daily as needed (  allergies.).  ? dicyclomine (BENTYL) 10 MG capsule Take 10 mg by mouth 4 (four) times daily -  before meals and at bedtime.  ? fluticasone (FLONASE) 50 MCG/ACT nasal spray Place 2 sprays into both nostrils daily as needed (allergies.).  ? furosemide (LASIX) 40 MG tablet TAKE 1 TABLET BY MOUTH EVERY DAY  ? ibuprofen (ADVIL) 600 MG tablet Take 600  mg by mouth every 6 (six) hours as needed.  ? ipratropium (ATROVENT HFA) 17 MCG/ACT inhaler Inhale 2 puffs into the lungs every 6 (six) hours.  ? losartan (COZAAR) 100 MG tablet TAKE 1 TABLET BY MOUTH EVERY DAY  ? metoprolol succinate (TOPROL-XL) 50 MG 24 hr tablet TAKE 1 TABLET BY MOUTH DAILY. TAKE WITH OR IMMEDIATELY FOLLOWING A MEAL.  ? montelukast (SINGULAIR) 10 MG tablet Take 1 tablet (10 mg total) by mouth at bedtime as needed (allergies.).  ? oxyCODONE-acetaminophen (PERCOCET/ROXICET) 5-325 MG tablet Take 1 tablet by mouth every 4 (four) hours as needed for severe pain.  ? pantoprazole (PROTONIX) 40 MG tablet Take 1 tablet (40 mg total) by mouth daily.  ? pregabalin (LYRICA) 100 MG capsule Take 100 mg by mouth at bedtime.  ? spironolactone (ALDACTONE) 50 MG tablet TAKE 1 TABLET BY MOUTH EVERY DAY  ? Vitamin D, Ergocalciferol, (DRISDOL) 1.25 MG (50000 UNIT) CAPS capsule Take 1 capsule by mouth once a week.  ? ? ?Allergies: Patient has no known allergies. ? ?Social History  ? ?Tobacco Use  ? Smoking status: Never  ? Smokeless tobacco: Never  ?Vaping Use  ? Vaping Use: Never used  ?Substance Use Topics  ? Alcohol use: Not Currently  ? Drug use: Never  ? ? ?Family History  ?Problem Relation Age of Onset  ? COPD Mother   ? Cancer Mother   ? Hypertension Father   ? Congestive Heart Failure Father   ? COPD Father   ? Lung cancer Father   ? Heart attack Father 3  ? Hypertension Sister   ? Diabetes Paternal Grandfather   ? Heart attack Paternal Grandfather   ? Healthy Son   ? Healthy Daughter   ? ? ?Review of Systems: ?A 12-system review of systems was performed and was negative except as noted in the HPI. ? ?-------------------------------------------------------------------------------------------------- ? ?Physical Exam: ?BP 110/90 (BP Location: Left Arm, Patient Position: Sitting, Cuff Size: Large)   Pulse 75   Ht 5' 9"  (1.753 m)   Wt 295 lb (133.8 kg)   SpO2 98%   BMI 43.56 kg/m?  ? ?General:  NAD.   Accompanied by his wife. ?Neck: No gross JVD, though body habitus limits evaluation. ?Lungs: Clear to auscultation bilaterally without wheezes or crackles. ?Heart: Regular rate and rhythm without murmurs, rubs, or gallops. ?Abdomen: Soft, nontender, nondistended. ?Extremities: No lower extremity edema. ? ?EKG:  Normal sinus rhythm with first degree AV block (PR interval 272 ms) and anterolateral T wave inversions.  No significant change since 06/11/2021. ? ?Lab Results  ?Component Value Date  ? WBC 13.1 (H) 09/22/2021  ? HGB 16.6 09/22/2021  ? HCT 50.4 09/22/2021  ? MCV 90.0 09/22/2021  ? PLT 373 09/22/2021  ? ? ?Lab Results  ?Component Value Date  ? NA 138 09/22/2021  ? K 4.1 09/22/2021  ? CL 103 09/22/2021  ? CO2 24 09/22/2021  ? BUN 20 09/22/2021  ? CREATININE 1.20 09/22/2021  ? GLUCOSE 138 (H) 09/22/2021  ? ALT 32 09/22/2021  ? ? ?Lab Results  ?Component Value Date  ? CHOL 96 05/19/2020  ?  HDL 27 (L) 05/19/2020  ? [place] 55 05/19/2020  ? TRIG 70 05/19/2020  ? CHOLHDL 3.6 05/19/2020  ? ? ?-------------------------------------------------------------------------------------------------- ? ?ASSESSMENT AND PLAN: ?Palpitations and PSVT: ?Aaron [name] has a history of episodic palpitations, though episode last weekend was more prolonged and severe than in the past.  I have reviewed his EKG's obtained during the admission; I feel that they are most consistent with sinus tachycardia with 1st degree AV block.  Atypical atrial flutter versus SVT seems less likely, though prior event monitor did show short burst of PSVT (lasting up to 15 seconds).  Aaron [name]' report of an abrupt drop in his heart rate would favor SVT/atrial flutter over sinus tachycardia.  I do not believe that we have enough evidence to call this atrial flutter at the moment and initiate lifelong anticoagulation in the setting of a CHADSVASC score of at least 2-3 (HTN, cardiomyopathy, and non-obstructive CAD).  Aaron [name] was seen by Dr. Quentin Ore in  11/2020; we will have him follow-up with Dr. Quentin Ore at his earliest convenience so that Dr. Quentin Ore can render an opinion about his recent ED EKG's as well as discuss utility for further monitoring versus loop reco

## 2021-09-28 NOTE — Patient Instructions (Signed)
Medication Instructions:  ? ?Your physician recommends that you continue on your current medications as directed. Please refer to the Current Medication list given to you today. ? ?*If you need a refill on your cardiac medications before your next appointment, please call your pharmacy* ? ? ?Lab Work: ? ?None ordered ? ?Testing/Procedures: ? ?None ordered ? ? ?Follow-Up: ?At [facility], you and your health needs are our priority.  As part of our continuing mission to provide you with exceptional heart care, we have created designated Provider Care Teams.  These Care Teams include your primary Cardiologist (physician) and Advanced Practice Providers (APPs -  Physician Assistants and Nurse Practitioners) who all work together to provide you with the care you need, when you need it. ? ?We recommend signing up for the patient portal called "MyChart".  Sign up information is provided on this After Visit Summary.  MyChart is used to connect with patients for Virtual Visits (Telemedicine).  Patients are able to view lab/test results, encounter notes, upcoming appointments, etc.  Non-urgent messages can be sent to your provider as well.   ?To learn more about what you can do with MyChart, go to [email or web address].   ? ?Your next appointment:   ? ?1) Follow up with Dr. Quentin Ore first available (SVT vs A flutter) ? ?2) Follow up with Dr. Saunders Revel 3-4 months ? ?Important Information About Sugar ? ? ? ? ? ? ?

## 2021-09-29 ENCOUNTER — Encounter: Payer: Self-pay | Admitting: Internal Medicine

## 2021-10-18 ENCOUNTER — Ambulatory Visit (INDEPENDENT_AMBULATORY_CARE_PROVIDER_SITE_OTHER): Payer: Medicare Other | Admitting: Urology

## 2021-10-18 VITALS — BP 103/71 | HR 56 | Ht 69.0 in | Wt 291.0 lb

## 2021-10-18 DIAGNOSIS — Z87442 Personal history of urinary calculi: Secondary | ICD-10-CM | POA: Diagnosis not present

## 2021-10-18 DIAGNOSIS — N401 Enlarged prostate with lower urinary tract symptoms: Secondary | ICD-10-CM

## 2021-10-18 DIAGNOSIS — R319 Hematuria, unspecified: Secondary | ICD-10-CM | POA: Diagnosis not present

## 2021-10-18 DIAGNOSIS — R3129 Other microscopic hematuria: Secondary | ICD-10-CM

## 2021-10-18 DIAGNOSIS — I2510 Atherosclerotic heart disease of native coronary artery without angina pectoris: Secondary | ICD-10-CM

## 2021-10-18 DIAGNOSIS — N200 Calculus of kidney: Secondary | ICD-10-CM

## 2021-10-18 NOTE — Progress Notes (Signed)
10/18/21 9:56 AM   Aaron [name] 1966/01/03 [id number]  Referring provider:  [facility] [address] [place],  [place] [address]   Urological history  1. BPH with LU TS -PSA 0.8 in 02/2021   2. Nephrolithiasis -surgery for kidney stones about 20 years ago -5 incidences of kidney stones -KUB no stone seen    3. Intermediate Risk Hematuria -non-smoker -CT abdomen and pelvis noncontrast on 01/02/2021 that revealed an enlarged prostate gland measuring 5.0 cm in transverse diameter with focal calcifications. It also revealed 5 mm proximal left ureteral calculus without significant ureterectasis or pyelocaliectasis, a 5 mm nonobstructive right upper pole renal calculus and a 7.25 cm exophytic left upper pole renal cyst -no reports of gross heme  -UA negative micro heme   4. Left renal cyst - seen on previous imaging, stable and benign; no further evaluation needed.  - MRI in 2019 for further characterization was benign   Chief Complaint  Patient presents with   Hematuria     HPI: Aaron [name] is a 56 y.o.male who presents today for a 6 month follow-up  Patient denies any modifying or aggravating factors.  Patient denies any gross hematuria, dysuria or suprapubic/flank pain.  Patient denies any fevers, chills, nausea or vomiting.   He has some urinary frequency but attributes this to fluid pills   UA benign   PMH: Past Medical History:  Diagnosis Date   ADD (attention deficit disorder)    Chronic back pain    disc   Coronary artery disease    Crohn's disease ([place])    Heart murmur    HFrEF (heart failure with reduced ejection fraction) ([place])    Hypertension    Kidney stone    MDD (major depressive disorder)    Obesity     Surgical History: Past Surgical History:  Procedure Laterality Date   CARDIAC CATHETERIZATION     COLONOSCOPY N/A 06/14/2021   Procedure: COLONOSCOPY;  Surgeon: Annamaria Helling, DO;  Location: [place];   Service: Gastroenterology;  Laterality: N/A;   ESOPHAGOGASTRODUODENOSCOPY N/A 06/14/2021   Procedure: ESOPHAGOGASTRODUODENOSCOPY (EGD);  Surgeon: Annamaria Helling, DO;  [facility] END[facility]COPY;  Service: Gastroenterology;  Laterality: N/A;   KIDNEY STONE SURGERY     LEFT HEART CATH AND CORONARY ANGIOGRAPHY Left 06/27/2020   Procedure: LEFT HEART CATH AND CORONARY ANGIOGRAPHY;  Surgeon: Nelva Bush, MD;  Location: [place] [facility];  Service: Cardiovascular;  Laterality: Left;   VASECTOMY      Home Medications:  Allergies as of 10/18/2021   No Known Allergies      Medication List        Accurate as of Oct 18, 2021  9:56 AM. If you have any questions, ask your nurse or doctor.          acetaminophen 325 MG tablet Commonly known as: TYLENOL Take 650-975 mg by mouth every 6 (six) hours as needed for moderate pain or headache (for pain.).   albuterol 108 (90 Base) MCG/ACT inhaler Commonly known as: VENTOLIN HFA Inhale 2 puffs into the lungs every 6 (six) hours as needed for wheezing or shortness of breath.   cetirizine 10 MG tablet Commonly known as: ZYRTEC Take 1 tablet (10 mg total) by mouth daily as needed (allergies.).   dicyclomine 10 MG capsule Commonly known as: BENTYL Take 10 mg by mouth 4 (four) times daily -  before meals and at bedtime.   fluticasone 50 MCG/ACT nasal spray Commonly known as: [place]  2 sprays into both nostrils daily as needed (allergies.).   furosemide 40 MG tablet Commonly known as: LASIX TAKE 1 TABLET BY MOUTH EVERY DAY   ibuprofen 600 MG tablet Commonly known as: ADVIL Take 600 mg by mouth every 6 (six) hours as needed.   ipratropium 17 MCG/ACT inhaler Commonly known as: ATROVENT HFA Inhale 2 puffs into the lungs every 6 (six) hours.   losartan 100 MG tablet Commonly known as: COZAAR TAKE 1 TABLET BY MOUTH EVERY DAY   metoprolol succinate 50 MG 24 hr tablet Commonly known as: TOPROL-XL TAKE 1 TABLET BY MOUTH  DAILY. TAKE WITH OR IMMEDIATELY FOLLOWING A MEAL.   montelukast 10 MG tablet Commonly known as: SINGULAIR Take 1 tablet (10 mg total) by mouth at bedtime as needed (allergies.).   oxyCODONE-acetaminophen 5-325 MG tablet Commonly known as: PERCOCET/ROXICET Take 1 tablet by mouth every 4 (four) hours as needed for severe pain.   pantoprazole 40 MG tablet Commonly known as: PROTONIX Take 1 tablet (40 mg total) by mouth daily.   pregabalin 100 MG capsule Commonly known as: LYRICA Take 100 mg by mouth at bedtime.   spironolactone 50 MG tablet Commonly known as: ALDACTONE TAKE 1 TABLET BY MOUTH EVERY DAY   Vitamin D (Ergocalciferol) 1.25 MG (50000 UNIT) Caps capsule Commonly known as: DRISDOL Take 1 capsule by mouth once a week.        Allergies:  No Known Allergies  Family History: Family History  Problem Relation Age of Onset   COPD Mother    Cancer Mother    Hypertension Father    Congestive Heart Failure Father    COPD Father    Lung cancer Father    Heart attack Father 14   Hypertension Sister    Diabetes Paternal Grandfather    Heart attack Paternal Grandfather    Healthy Son    Healthy Daughter     Social History:  reports that he has never smoked. He has never used smokeless tobacco. He reports that he does not currently use alcohol. He reports that he does not use drugs.   Physical Exam: There were no vitals taken for this visit.  Constitutional:  Alert and oriented, No acute distress. HEENT: [place] AT, moist mucus membranes.  Trachea midline, no masses. Cardiovascular: No clubbing, cyanosis, or edema. Respiratory: Normal respiratory effort, no increased work of breathing. Skin: No rashes, bruises or suspicious lesions. Neurologic: Grossly intact, no focal deficits, moving all 4 extremities. Psychiatric: Normal mood and affect.  Laboratory Data: Lab Results  Component Value Date   CREATININE 1.20 09/22/2021   Lab Results  Component Value Date    HGBA1C 6.2 (H) 05/19/2020   Component     Latest Ref Rng 09/22/2021  Glucose     70 - 99 mg/dL 138 (H)   BUN     6 - 20 mg/dL 20   Creatinine     0.61 - 1.24 mg/dL 1.20   Sodium     135 - 145 mmol/L 138   Potassium     3.5 - 5.1 mmol/L 4.1   Chloride     98 - 111 mmol/L 103   CO2     22 - 32 mmol/L 24   Calcium     8.9 - 10.3 mg/dL 9.0   Total Protein     6.5 - 8.1 g/dL 8.1   Albumin     3.5 - 5.0 g/dL 3.9   Total Bilirubin     0.3 -  1.2 mg/dL 0.7   Alkaline Phosphatase     38 - 126 U/L 81   AST     15 - 41 U/L 33   ALT     0 - 44 U/L 32   GFR, Estimated     >60 mL/min >60   Anion gap     5 - 15  11     (H) High  Component     Latest Ref Rng 09/22/2021  WBC     4.0 - 10.5 K/uL 13.1 (H)   RBC     4.22 - 5.81 MIL/uL 5.60   Hemoglobin     13.0 - 17.0 g/dL 16.6   HCT     39.0 - 52.0 % 50.4   MCV     80.0 - 100.0 fL 90.0   MCH     26.0 - 34.0 pg 29.6   MCHC     30.0 - 36.0 g/dL 32.9   RDW     11.5 - 15.5 % 12.2   Platelets     150 - 400 K/uL 373   Neutrophils     % 62   Lymphs     Not Estab. %   Monocytes     Not Estab. %   Eos     Not Estab. %   Basos     Not Estab. %   NEUT#     1.7 - 7.7 K/uL 8.2 (H)   Lymphocyte #     0.7 - 4.0 K/uL 3.7   Monocytes Absolute     0.1 - 0.9 x10E3/uL   EOS (ABSOLUTE)     0.0 - 0.4 x10E3/uL   Basophils Absolute     0.0 - 0.1 K/uL 0.1   Immature Granulocytes     % 0   Immature Grans (Abs)     0.0 - 0.1 x10E3/uL   nRBC     0.0 - 0.2 % 0.0   Lymphocytes     % 28   Monocytes Relative     % 8   Monocyte #     0.1 - 1.0 K/uL 1.0   Eosinophil     % 1   Eosinophils Absolute     0.0 - 0.5 K/uL 0.2   Basophil     % 1   Abs Immature Granulocytes     0.00 - 0.07 K/uL 0.04   WBC, UA     0 - 5 /hpf   Epithelial Cells (non renal)     0 - 10 /hpf   Bacteria, UA     None seen/Few    Mucus, UA     Not Estab.      Results for orders placed or performed in visit on 10/18/21  Microscopic Examination    Urine  Result Value Ref Range   WBC, UA 0-5 0 - 5 /hpf   RBC None seen 0 - 2 /hpf   Epithelial Cells (non renal) 0-10 0 - 10 /hpf   Bacteria, UA None seen None seen/Few  Urinalysis, Complete  Result Value Ref Range   Specific Gravity, UA 1.025 1.005 - 1.030   pH, UA 6.0 5.0 - 7.5   Color, UA Yellow Yellow   Appearance Ur Clear Clear   Leukocytes,UA Negative Negative   Protein,UA Negative Negative/Trace   Glucose, UA Negative Negative   Ketones, UA Negative Negative   RBC, UA Negative Negative   Bilirubin, UA Negative Negative   Urobilinogen, Ur 0.2 0.2 -  1.0 mg/dL   Nitrite, UA Negative Negative   Microscopic Examination See below:   I have reviewed the labs.   Assessment & Plan:    Nephrolithiasis  -no complaints of renal colic and/or passage of fragments - CT scan in December 2022 noted tiny 3 mm right renal stone   2. Intermediate risk hematuria  - UA negative for micro heme today and no reports of gross hematuria.   No follow-ups on file.  [facility] [address] [place], [place] [address] [phone]  I,Kailey Littlejohn,acting as a scribe for [facility], PA-C.,have documented all relevant documentation on the behalf of Ahkeem Goede, PA-C,as directed by  [facility], PA-C while in the presence of [place], PA-C.

## 2021-10-19 ENCOUNTER — Encounter: Payer: Self-pay | Admitting: Urology

## 2021-10-19 LAB — MICROSCOPIC EXAMINATION
Bacteria, UA: NONE SEEN
RBC, Urine: NONE SEEN /hpf (ref 0–2)

## 2021-10-19 LAB — URINALYSIS, COMPLETE
Bilirubin, UA: NEGATIVE
Glucose, UA: NEGATIVE
Ketones, UA: NEGATIVE
Leukocytes,UA: NEGATIVE
Nitrite, UA: NEGATIVE
Protein,UA: NEGATIVE
RBC, UA: NEGATIVE
Specific Gravity, UA: 1.025 (ref 1.005–1.030)
Urobilinogen, Ur: 0.2 mg/dL (ref 0.2–1.0)
pH, UA: 6 (ref 5.0–7.5)

## 2021-10-20 ENCOUNTER — Other Ambulatory Visit: Payer: Self-pay | Admitting: Family

## 2021-10-20 ENCOUNTER — Other Ambulatory Visit: Payer: Self-pay | Admitting: Internal Medicine

## 2021-10-20 DIAGNOSIS — I5022 Chronic systolic (congestive) heart failure: Secondary | ICD-10-CM

## 2021-10-21 ENCOUNTER — Emergency Department
Admission: EM | Admit: 2021-10-21 | Discharge: 2021-10-22 | Disposition: A | Discharge status: Home / Self Care | Payer: Medicare Other | Attending: Emergency Medicine | Admitting: Emergency Medicine

## 2021-10-21 ENCOUNTER — Other Ambulatory Visit: Payer: Self-pay

## 2021-10-21 ENCOUNTER — Emergency Department: Payer: Medicare Other

## 2021-10-21 DIAGNOSIS — R0789 Other chest pain: Secondary | ICD-10-CM

## 2021-10-21 DIAGNOSIS — R0781 Pleurodynia: Secondary | ICD-10-CM | POA: Diagnosis present

## 2021-10-21 DIAGNOSIS — I10 Essential (primary) hypertension: Secondary | ICD-10-CM | POA: Insufficient documentation

## 2021-10-21 LAB — COMPREHENSIVE METABOLIC PANEL
ALT: 40 U/L (ref 0–44)
AST: 32 U/L (ref 15–41)
Albumin: 3.8 g/dL (ref 3.5–5.0)
Alkaline Phosphatase: 97 U/L (ref 38–126)
Anion gap: 9 (ref 5–15)
BUN: 17 mg/dL (ref 6–20)
CO2: 23 mmol/L (ref 22–32)
Calcium: 9.7 mg/dL (ref 8.9–10.3)
Chloride: 106 mmol/L (ref 98–111)
Creatinine, Ser: 1.09 mg/dL (ref 0.61–1.24)
GFR, Estimated: >60 mL/min (ref >60)
Glucose, Bld: 132 mg/dL — ABNORMAL HIGH (ref 70–99)
Potassium: 3.8 mmol/L (ref 3.5–5.1)
Sodium: 138 mmol/L (ref 135–145)
Total Bilirubin: 0.7 mg/dL (ref 0.3–1.2)
Total Protein: 7.7 g/dL (ref 6.5–8.1)

## 2021-10-21 LAB — CBC WITH DIFFERENTIAL/PLATELET
Abs Immature Granulocytes: 0.05 10*3/uL (ref 0.00–0.07)
Basophils Absolute: 0.1 10*3/uL (ref 0.0–0.1)
Basophils Relative: 1 %
Eosinophils Absolute: 0.1 10*3/uL (ref 0.0–0.5)
Eosinophils Relative: 1 %
HCT: 47.9 % (ref 39.0–52.0)
Hemoglobin: 16.1 g/dL (ref 13.0–17.0)
Immature Granulocytes: 0 %
Lymphocytes Relative: 20 %
Lymphs Abs: 2.4 10*3/uL (ref 0.7–4.0)
MCH: 29.8 pg (ref 26.0–34.0)
MCHC: 33.6 g/dL (ref 30.0–36.0)
MCV: 88.5 fL (ref 80.0–100.0)
Monocytes Absolute: 1.1 10*3/uL — ABNORMAL HIGH (ref 0.1–1.0)
Monocytes Relative: 9 %
Neutro Abs: 8.1 10*3/uL — ABNORMAL HIGH (ref 1.7–7.7)
Neutrophils Relative %: 69 %
Platelets: 365 10*3/uL (ref 150–400)
RBC: 5.41 MIL/uL (ref 4.22–5.81)
RDW: 12 % (ref 11.5–15.5)
WBC: 11.8 10*3/uL — ABNORMAL HIGH (ref 4.0–10.5)
nRBC: 0 % (ref 0.0–0.2)

## 2021-10-21 LAB — LIPASE, BLOOD: Lipase: 39 U/L (ref 11–51)

## 2021-10-21 LAB — TROPONIN I (HIGH SENSITIVITY): Troponin I (High Sensitivity): 11 ng/L (ref <18)

## 2021-10-21 MED ORDER — METOPROLOL TARTRATE 5 MG/5ML IV SOLN
5.0000 mg | Freq: Once | INTRAVENOUS | Status: AC
  Administered 2021-10-21: 5 mg via INTRAVENOUS
  Filled 2021-10-21: qty 5

## 2021-10-21 NOTE — ED Provider Notes (Signed)
[facility] Provider Note    Event Date/Time   First MD Initiated Contact with Patient 10/21/21 2209     (approximate)   History   Chief Complaint: Chest Pain   HPI  Aaron [name] is a 56 y.o. male with a history of morbid obesity, hypertension, generalized anxiety disorder, paroxysmal SVT who comes ED complaining of left-sided chest pain that started at about 8:00 PM tonight after eating dinner.  He reports this is a recurrent syndrome for him and has extensive work-up in the past by cardiology and gastroenterology.  He is currently awaiting an appointment with electrophysiology which is in 3 weeks.  Pain is nonradiating, no shortness of breath diaphoresis or vomiting.  No exertional or pleuritic symptoms.  No cough fevers or chills.  Pain feels dull, not sharp or pressure or heavy.  He was given full dose aspirin prior to arrival in the hospital.  He notes the pain seems reproducible with palpation of his left lower rib edge.  He also notes that the symptoms are associated with tachycardia commonly.     Physical Exam   Triage Vital Signs: ED Triage Vitals [10/21/21 2209]  Enc Vitals Group     BP 129/85     Pulse Rate (!) 101     Resp 16     Temp 98 F (36.7 C)     Temp Source Oral     SpO2 97 %     Weight 290 lb (131.5 kg)     Height 5' 9"  (1.753 m)     Head Circumference      Peak Flow      Pain Score 4     Pain Loc      Pain Edu?      Excl. in [place]?     Most recent vital signs: Vitals:   10/21/21 2209 10/21/21 2230  BP: 129/85 (!) 103/58  Pulse: (!) 101 (!) 112  Resp: 16 17  Temp: 98 F (36.7 C)   SpO2: 97% 98%    General: Awake, no distress.  CV:  Good peripheral perfusion.  Tachycardia, heart rate 100 Resp:  Normal effort.  Clear to auscultation bilaterally Abd:  No distention.  Soft nontender Other:  No lower extremity edema or calf tenderness.  Symmetric calf circumference.   ED Results / Procedures / Treatments    Labs (all labs ordered are listed, but only abnormal results are displayed) Labs Reviewed  COMPREHENSIVE METABOLIC PANEL - Abnormal; Notable for the following components:      Result Value   Glucose, Bld 132 (*)    All other components within normal limits  CBC WITH DIFFERENTIAL/PLATELET - Abnormal; Notable for the following components:   WBC 11.8 (*)    Neutro Abs 8.1 (*)    Monocytes Absolute 1.1 (*)    All other components within normal limits  LIPASE, BLOOD  D-DIMER, QUANTITATIVE (NOT AT [facility])  MAGNESIUM  TROPONIN I (HIGH SENSITIVITY)     EKG Interpreted by me Sinus rhythm, rate of 94.  Normal axis, prolonged QTc of 556 ms.  Normal QRS ST segments and T waves.   RADIOLOGY Chest x-ray viewed and interpreted by me, appears normal.  Radiology report reviewed.   PROCEDURES:  Procedures   MEDICATIONS ORDERED IN ED: Medications  metoprolol tartrate (LOPRESSOR) injection 5 mg (5 mg Intravenous Given 10/21/21 2239)     IMPRESSION / MDM / ASSESSMENT AND PLAN / ED COURSE  I reviewed the triage vital  signs and the nursing notes.                              Differential diagnosis includes, but is not limited to, anxiety, GERD, non-STEMI, pulmonary embolism, cardiac mass, anemia, dehydration, renal insufficiency, pancreatitis, myocarditis  Patient's presentation is most consistent with acute presentation with potential threat to life or bodily function.  Patient presents with atypical left-sided chest pain.  It is reproducible on exam and suspicious for him musculoskeletal origin of his pain.  However, patient has tachycardia, multiple comorbidities and risk factors for heart disease.  Despite previous work-ups, will need to trend troponins, obtain D-dimer to screen for VTE, check electrolytes.  EKG is reassuring except for prolonged QTc.  Exam is nonfocal.  If labs are reassuring, I think patient will be suitable for outpatient management.  I will give a dose of IV  metoprolol to attempt to improve rate control of his sinus tachycardia.  I doubt infection or sepsis, aortic dissection, ruptured heart valve, acute heart failure, pericardial effusion, or pneumothorax.  Care of patient signed out to Dr. Leonides Schanz at 11:00 PM.      FINAL CLINICAL IMPRESSION(S) / ED DIAGNOSES   Final diagnoses:  Atypical chest pain     Rx / DC Orders   ED Discharge Orders     None        Note:  This document was prepared using Dragon voice recognition software and may include unintentional dictation errors.   Carrie Mew, MD 10/22/21 [place]

## 2021-10-21 NOTE — ED Triage Notes (Signed)
Patient arrived via EMS for chest pain x 2 hours. EMS report initial HR sinus tach at 130, BP 130/90. 18 G left AC. Nitro spray x 1 and 324 aspirin. BP after nitro 90/60. Patient AOX4 with spontaneous regular respiration on RA. Reports pain of 4/10 to left chest. Denies shortness of breath.

## 2021-10-22 ENCOUNTER — Emergency Department: Payer: Medicare Other

## 2021-10-22 ENCOUNTER — Encounter: Payer: Self-pay | Admitting: Radiology

## 2021-10-22 DIAGNOSIS — R0781 Pleurodynia: Secondary | ICD-10-CM | POA: Diagnosis not present

## 2021-10-22 LAB — MAGNESIUM: Magnesium: 2.6 mg/dL — ABNORMAL HIGH (ref 1.7–2.4)

## 2021-10-22 LAB — D-DIMER, QUANTITATIVE: D-Dimer, Quant: 0.43 ug/mL-FEU (ref 0.00–0.50)

## 2021-10-22 LAB — TROPONIN I (HIGH SENSITIVITY): Troponin I (High Sensitivity): 12 ng/L (ref <18)

## 2021-10-22 MED ORDER — IOHEXOL 300 MG/ML  SOLN
75.0000 mL | Freq: Once | INTRAMUSCULAR | Status: AC | PRN
  Administered 2021-10-22: 75 mL via INTRAVENOUS

## 2021-10-22 MED ORDER — SODIUM CHLORIDE 0.9 % IV BOLUS (SEPSIS)
1000.0000 mL | Freq: Once | INTRAVENOUS | Status: AC
  Administered 2021-10-22: 1000 mL via INTRAVENOUS

## 2021-10-22 NOTE — ED Provider Notes (Signed)
11:15 PM  Assumed care at shift change.  Patient here with chest pain, tachycardia.  Labs, chest x-ray pending.    2:00 AM  Pt's labs reviewed.  Troponin x2 negative.  Negative D-dimer.  Normal electrolytes.  Chest x-ray showed no acute abnormality when reviewed and interpreted by myself radiology but there was progressive widening of the right paratracheal soft tissues which could be vascular shadow or artifactual but radiology recommended proceeding with CT of the chest with IV contrast.  Patient and family agreed with this plan.  CT scan completed, reviewed and interpreted by myself and radiologist and shows no acute abnormality.  Patient's heart rate now in the 70s.  He has no current symptoms.  He is being followed closely by cardiology as an outpatient and has an appointment with electrophysiology on June 14.  I feel he is safe for discharge home.    At this time, I do not feel there is any life-threatening condition present. I reviewed all nursing notes, vitals, pertinent previous records.  All lab and urine results, EKGs, imaging ordered have been independently reviewed and interpreted by myself.  I reviewed all available radiology reports from any imaging ordered this visit.  Based on my assessment, I feel the patient is safe to be discharged home without further emergent workup and can continue workup as an outpatient as needed. Discussed all findings, treatment plan as well as usual and customary return precautions with patient and wife.  They verbalize understanding and are comfortable with this plan.  Outpatient follow-up has been provided as needed.  All questions have been answered.    Yashvi Jasinski, Delice Bison, DO 10/22/21 [phone]

## 2021-10-22 NOTE — Telephone Encounter (Signed)
LMVOM to verify if pt is taking Jardiance. Medication is not on current medication list.

## 2021-10-23 ENCOUNTER — Telehealth: Payer: Self-pay | Admitting: Internal Medicine

## 2021-10-23 NOTE — Telephone Encounter (Signed)
Pt isn't taking Jaridance 10 mg. D/C due to UTI's.

## 2021-10-23 NOTE — Telephone Encounter (Signed)
Name from pharmacy: JARDIANCE 10 [place]       Will file in chart as: JARDIANCE 10 MG TABS tablet   The original prescription was discontinued on 11/20/2020 by Ronaldo Miyamoto, CMA. Renewing this prescription may not be appropriate.   Sig: TAKE 1 TABLET BY MOUTH DAILY BEFORE BREAKFAST - STOP FARXIGA   Disp:  30 tablet    Refills:  5   Start: 10/20/2021   Class: Normal   Non-formulary For: Chronic systolic heart failure ([place])   Last ordered: 1 year ago by Loel Dubonnet, NP Last refill: 10/22/2020   Rx #: [id number]     To be filled at: [id number] [place] [place] [address]      Medication Refill (Newest Message First) MOthelia Pulling CMA  22 hours ago (1:11 PM)   LMVOM to verify if pt is taking Jardiance. Medication is not on current medication list.      Note

## 2021-10-23 NOTE — Telephone Encounter (Signed)
Patient states he received a call from a Lenda Kelp asking him to call her back to go over his medications, he said she called from out number.  He also wanted to stay that he was at the emergency room the night before last because his heart went up to 125 bpm. They gave him metoprolol IV and that helped bring his HR down.  They advised him to make [place] aware.

## 2021-11-10 ENCOUNTER — Emergency Department: Payer: Medicare Other

## 2021-11-10 ENCOUNTER — Encounter: Payer: Self-pay | Admitting: Internal Medicine

## 2021-11-10 ENCOUNTER — Other Ambulatory Visit: Payer: Self-pay

## 2021-11-10 ENCOUNTER — Observation Stay
Admission: EM | Admit: 2021-11-10 | Discharge: 2021-11-11 | Disposition: A | Discharge status: Home / Self Care | Payer: Medicare Other | Attending: Internal Medicine | Admitting: Internal Medicine

## 2021-11-10 DIAGNOSIS — I10 Essential (primary) hypertension: Secondary | ICD-10-CM | POA: Diagnosis present

## 2021-11-10 DIAGNOSIS — I110 Hypertensive heart disease with heart failure: Secondary | ICD-10-CM | POA: Diagnosis not present

## 2021-11-10 DIAGNOSIS — I2510 Atherosclerotic heart disease of native coronary artery without angina pectoris: Secondary | ICD-10-CM | POA: Diagnosis not present

## 2021-11-10 DIAGNOSIS — I5042 Chronic combined systolic (congestive) and diastolic (congestive) heart failure: Secondary | ICD-10-CM | POA: Diagnosis not present

## 2021-11-10 DIAGNOSIS — Z8616 Personal history of COVID-19: Secondary | ICD-10-CM | POA: Diagnosis not present

## 2021-11-10 DIAGNOSIS — R0609 Other forms of dyspnea: Secondary | ICD-10-CM

## 2021-11-10 DIAGNOSIS — I4891 Unspecified atrial fibrillation: Secondary | ICD-10-CM | POA: Diagnosis not present

## 2021-11-10 DIAGNOSIS — Z79899 Other long term (current) drug therapy: Secondary | ICD-10-CM | POA: Insufficient documentation

## 2021-11-10 DIAGNOSIS — I4892 Unspecified atrial flutter: Secondary | ICD-10-CM | POA: Diagnosis present

## 2021-11-10 DIAGNOSIS — G4733 Obstructive sleep apnea (adult) (pediatric): Secondary | ICD-10-CM | POA: Diagnosis present

## 2021-11-10 DIAGNOSIS — R002 Palpitations: Secondary | ICD-10-CM | POA: Diagnosis present

## 2021-11-10 DIAGNOSIS — I5032 Chronic diastolic (congestive) heart failure: Secondary | ICD-10-CM | POA: Diagnosis present

## 2021-11-10 DIAGNOSIS — F411 Generalized anxiety disorder: Secondary | ICD-10-CM | POA: Diagnosis present

## 2021-11-10 DIAGNOSIS — I25118 Atherosclerotic heart disease of native coronary artery with other forms of angina pectoris: Secondary | ICD-10-CM | POA: Diagnosis present

## 2021-11-10 LAB — CBC
HCT: 48.2 % (ref 39.0–52.0)
Hemoglobin: 16.3 g/dL (ref 13.0–17.0)
MCH: 30.1 pg (ref 26.0–34.0)
MCHC: 33.8 g/dL (ref 30.0–36.0)
MCV: 89.1 fL (ref 80.0–100.0)
Platelets: 341 10*3/uL (ref 150–400)
RBC: 5.41 MIL/uL (ref 4.22–5.81)
RDW: 12 % (ref 11.5–15.5)
WBC: 12.5 10*3/uL — ABNORMAL HIGH (ref 4.0–10.5)
nRBC: 0 % (ref 0.0–0.2)

## 2021-11-10 LAB — BASIC METABOLIC PANEL
Anion gap: 8 (ref 5–15)
BUN: 25 mg/dL — ABNORMAL HIGH (ref 6–20)
CO2: 23 mmol/L (ref 22–32)
Calcium: 9.3 mg/dL (ref 8.9–10.3)
Chloride: 105 mmol/L (ref 98–111)
Creatinine, Ser: 1.01 mg/dL (ref 0.61–1.24)
GFR, Estimated: >60 mL/min (ref >60)
Glucose, Bld: 110 mg/dL — ABNORMAL HIGH (ref 70–99)
Potassium: 4 mmol/L (ref 3.5–5.1)
Sodium: 136 mmol/L (ref 135–145)

## 2021-11-10 LAB — TROPONIN I (HIGH SENSITIVITY)
Troponin I (High Sensitivity): 11 ng/L (ref <18)
Troponin I (High Sensitivity): 15 ng/L (ref <18)

## 2021-11-10 LAB — BRAIN NATRIURETIC PEPTIDE: B Natriuretic Peptide: 122.5 pg/mL — ABNORMAL HIGH (ref 0.0–100.0)

## 2021-11-10 LAB — PROTIME-INR
INR: 1.1 (ref 0.8–1.2)
Prothrombin Time: 13.6 seconds (ref 11.4–15.2)

## 2021-11-10 LAB — D-DIMER, QUANTITATIVE: D-Dimer, Quant: 0.34 ug/mL-FEU (ref 0.00–0.50)

## 2021-11-10 LAB — T4, FREE: Free T4: 1.56 ng/dL — ABNORMAL HIGH (ref 0.61–1.12)

## 2021-11-10 LAB — TSH: TSH: 0.427 u[IU]/mL (ref 0.350–4.500)

## 2021-11-10 MED ORDER — SODIUM CHLORIDE 0.9 % IV BOLUS
1000.0000 mL | Freq: Once | INTRAVENOUS | Status: AC
  Administered 2021-11-10: 1000 mL via INTRAVENOUS

## 2021-11-10 MED ORDER — AMIODARONE HCL IN DEXTROSE 360-4.14 MG/200ML-% IV SOLN
60.0000 mg/h | INTRAVENOUS | Status: AC
Start: 2021-11-10 — End: 2021-11-11
  Administered 2021-11-10: 60 mg/h via INTRAVENOUS
  Filled 2021-11-10: qty 200

## 2021-11-10 MED ORDER — PANTOPRAZOLE SODIUM 40 MG IV SOLR
40.0000 mg | Freq: Two times a day (BID) | INTRAVENOUS | Status: DC
Start: 2021-11-10 — End: 2021-11-11
  Administered 2021-11-10: 40 mg via INTRAVENOUS
  Filled 2021-11-10: qty 10

## 2021-11-10 MED ORDER — HEPARIN (PORCINE) 25000 UT/250ML-% IV SOLN
1400.0000 [IU]/h | INTRAVENOUS | Status: DC
  Administered 2021-11-10: 1400 [IU]/h via INTRAVENOUS
  Filled 2021-11-10: qty 250

## 2021-11-10 MED ORDER — ALBUTEROL SULFATE HFA 108 (90 BASE) MCG/ACT IN AERS: 2.0000 | INHALATION_SPRAY | Freq: Four times a day (QID) | RESPIRATORY_TRACT | Status: DC | PRN

## 2021-11-10 MED ORDER — HEPARIN BOLUS VIA INFUSION
5000.0000 [IU] | Freq: Once | INTRAVENOUS | Status: AC
  Administered 2021-11-10: 5000 [IU] via INTRAVENOUS
  Filled 2021-11-10: qty 5000

## 2021-11-10 MED ORDER — ONDANSETRON HCL 4 MG/2ML IJ SOLN: 4.0000 mg | Freq: Four times a day (QID) | INTRAMUSCULAR | Status: DC | PRN

## 2021-11-10 MED ORDER — MONTELUKAST SODIUM 10 MG PO TABS: 10.0000 mg | ORAL_TABLET | Freq: Every evening | ORAL | Status: DC | PRN

## 2021-11-10 MED ORDER — AMIODARONE HCL IN DEXTROSE 360-4.14 MG/200ML-% IV SOLN
30.0000 mg/h | INTRAVENOUS | Status: DC
  Administered 2021-11-11: 30 mg/h via INTRAVENOUS
  Filled 2021-11-10: qty 200

## 2021-11-10 MED ORDER — DILTIAZEM HCL-DEXTROSE 125-5 MG/125ML-% IV SOLN (PREMIX)
5.0000 mg/h | INTRAVENOUS | Status: DC
  Administered 2021-11-10: 5 mg/h via INTRAVENOUS
  Filled 2021-11-10: qty 125

## 2021-11-10 MED ORDER — AMIODARONE HCL 200 MG PO TABS: 200.0000 mg | ORAL_TABLET | Freq: Two times a day (BID) | ORAL | Status: DC

## 2021-11-10 MED ORDER — ACETAMINOPHEN 325 MG PO TABS
650.0000 mg | ORAL_TABLET | ORAL | Status: DC | PRN
Start: 2021-11-10 — End: 2021-11-11

## 2021-11-10 MED ORDER — FLUTICASONE PROPIONATE 50 MCG/ACT NA SUSP: 2.0000 | Freq: Every day | NASAL | Status: DC | PRN

## 2021-11-10 MED ORDER — AMIODARONE HCL 200 MG PO TABS: 200.0000 mg | ORAL_TABLET | Freq: Every day | ORAL | Status: DC

## 2021-11-10 NOTE — ED Triage Notes (Signed)
BIB [facility] FROM home. 1500 got up off couch and got dizzy. HR was `135ish on scene with home pulse ox. Pt has been seen a few times for same. Pt has apt coming up for same. Took Metoprolol extra dose at 330 then called ems.  118/80 97.7 T 152BGL 20G RAC

## 2021-11-10 NOTE — Hospital Course (Addendum)
56 year old male past medical history of morbid obesity systolic heart failure with ejection fraction of 40% and hypertension who presented to the emergency room with irregular heartbeat 6/10 rapid atrial flutter with a heart rate of 133.  Patient started on Cardizem drip & cardiology consulted.  Patient still having some hypotension and was changed from Cardizem over to amiodarone and infusion.  By night of 6/10, patient had converted to normal sinus rhythm.  Seen by cardiology in the morning and echocardiogram done which noted normal ejection fraction.  Patient started on flecainide twice daily and felt to be stable for discharge.  Patient to keep already scheduled cardiology appointment for this week.

## 2021-11-10 NOTE — Assessment & Plan Note (Addendum)
Initially requiring Cardizem drip and then with hypotension, changed over to amiodarone.  Now sinus rhythm.Aaron [name]

## 2021-11-10 NOTE — Assessment & Plan Note (Signed)
>>  ASSESSMENT AND PLAN FOR MORBID OBESITY (HCC) WRITTEN ON 11/11/2021  2:13 PM BY Hollice Espy, MD  .  Meets criteria BMI greater than 40

## 2021-11-10 NOTE — ED Provider Notes (Signed)
[facility] Provider Note   Event Date/Time   First MD Initiated Contact with Patient 11/10/21 1642     (approximate) History  Irregular Heart Beat  HPI Aaron [name] is a 56 y.o. male  Location: Chest Duration: Began this afternoon Timing: Stable since onset Severity: 5/10 Quality: Palpitations, lightheadedness, dyspnea on exertion Context: Patient states that around 2:00 today he began feeling palpitations, dyspnea on exertion, and found his heart rate to be in the 150s Modifying factors: Patient states that his heart rate has not responded to an extra half dose of his metoprolol.  Patient denies any other exacerbating or relieving symptoms other than exertion worsening his dyspnea Associated Symptoms: Denies ROS: Patient currently denies any vision changes, tinnitus, difficulty speaking, facial droop, sore throat, chest pain, shortness of breath, abdominal pain, nausea/vomiting/diarrhea, dysuria, or weakness/numbness/paresthesias in any extremity   Physical Exam  Triage Vital Signs: ED Triage Vitals  Enc Vitals Group     BP 11/10/21 1647 125/65     Pulse Rate 11/10/21 1647 (!) 132     Resp 11/10/21 1647 18     Temp 11/10/21 1647 98 F (36.7 C)     Temp Source 11/10/21 1647 Oral     SpO2 11/10/21 1647 98 %     Weight 11/10/21 1648 290 lb (131.5 kg)     Height 11/10/21 1648 5' 9"  (1.753 m)     Head Circumference --      Peak Flow --      Pain Score 11/10/21 1647 5     Pain Loc --      Pain Edu? --      Excl. in [place]? --    Most recent vital signs: Vitals:   11/10/21 1830 11/10/21 1900  BP: 111/87 95/73  Pulse: (!) 131 (!) 132  Resp:  16  Temp:    SpO2: 99% 98%   General: Awake, oriented x4. CV:  Good peripheral perfusion.  Resp:  Normal effort.  Abd:  No distention.  Other:  Middle-aged obese Caucasian male laying in bed in no acute distress ED Results / Procedures / Treatments  Labs (all labs ordered are listed, but only abnormal  results are displayed) Labs Reviewed  BASIC METABOLIC PANEL - Abnormal; Notable for the following components:      Result Value   Glucose, Bld 110 (*)    BUN 25 (*)    All other components within normal limits  CBC - Abnormal; Notable for the following components:   WBC 12.5 (*)    All other components within normal limits  PROTIME-INR  HEMOGLOBIN A1C  TROPONIN I (HIGH SENSITIVITY)  TROPONIN I (HIGH SENSITIVITY)   EKG ED ECG REPORT I, Naaman Plummer, the attending physician, personally viewed and interpreted this ECG. Date: 11/10/2021 EKG Time: 1646 Rate: 132 Rhythm: Atrial flutter QRS Axis: normal Intervals: normal ST/T Wave abnormalities: normal Narrative Interpretation: no evidence of acute ischemia RADIOLOGY ED MD interpretation: 2 view chest x-ray interpreted by me shows no evidence of acute abnormalities including no pneumonia, pneumothorax, or widened mediastinum -Agree with radiology assessment Official radiology report(s): DG Chest 2 View  Result Date: 11/10/2021 CLINICAL DATA:  Chest pain EXAM: CHEST - 2 VIEW COMPARISON:  10/21/2021 FINDINGS: Normal heart size and vascularity. Lateral pleural thickening again noted bilaterally. Minor basilar atelectasis versus scarring. Negative for pneumonia, edema, effusion, or pneumothorax. Trachea midline. Spondylosis noted of the spine. IMPRESSION: Stable exam.  No acute chest finding by plain radiography Electronically  Signed   By: Jerilynn Mages.  Shick M.D.   On: 11/10/2021 17:11   PROCEDURES: Critical Care performed: Yes, see critical care procedure note(s) .1-3 Lead EKG Interpretation  Performed by: Naaman Plummer, MD Authorized by: Naaman Plummer, MD     Interpretation: abnormal     ECG rate:  133   ECG rate assessment: tachycardic     Rhythm: atrial flutter     Ectopy: none     Conduction: normal   CRITICAL CARE Performed by: Naaman Plummer  Total critical care time: 37 minutes  Critical care time was exclusive of  separately billable procedures and treating other patients.  Critical care was necessary to treat or prevent imminent or life-threatening deterioration.  Critical care was time spent personally by me on the following activities: development of treatment plan with patient and/or surrogate as well as nursing, discussions with consultants, evaluation of patient's response to treatment, examination of patient, obtaining history from patient or surrogate, ordering and performing treatments and interventions, ordering and review of laboratory studies, ordering and review of radiographic studies, pulse oximetry and re-evaluation of patient's condition.  MEDICATIONS ORDERED IN ED: Medications  diltiazem (CARDIZEM) 125 mg in dextrose 5% 125 mL (1 mg/mL) infusion (5 mg/hr Intravenous New Bag/Given 11/10/21 1832)  sodium chloride 0.9 % bolus 1,000 mL (1,000 mLs Intravenous New Bag/Given 11/10/21 1842)   IMPRESSION / MDM / ASSESSMENT AND PLAN / ED COURSE  I reviewed the triage vital signs and the nursing notes.                             The patient is on the cardiac monitor to evaluate for evidence of arrhythmia and/or significant heart rate changes. Patient's presentation is most consistent with acute presentation with potential threat to life or bodily function. + Atrial flutter with RVR DDx: Pneumothorax, Pneumonia, Pulmonary Embolus, Tamponade, ACS, Thyrotoxicosis.  No history or evidence decompensated heart failure. Given their history and exam it is likely this patient is unlikely to spontaneously revert to a rate controlled rhythm and necessitates a thorough workup for their arrhythmia. Workup: ECG, CXR, CBC, BMP, UA, Troponin, BNP, TSH, Ca-Mag-Phos Interventions: Defer Cardioversion (uncertain historical reliability with time of onset, increased risk of thromboembolic stroke).  Start diltiazem bolus and drip Consult: I spoke to Dr. Rockey Situ in cardiology who agrees with plan and possible  cardioversion if patient does not convert on his own to normal sinus rhythm Disposition: Admit   FINAL CLINICAL IMPRESSION(S) / ED DIAGNOSES   Final diagnoses:  Atrial flutter with rapid ventricular response ([place])  Palpitations  Dyspnea on exertion   Rx / DC Orders   ED Discharge Orders     None      Note:  This document was prepared using Dragon voice recognition software and may include unintentional dictation errors.   Naaman Plummer, MD 11/10/21 1911

## 2021-11-10 NOTE — Assessment & Plan Note (Addendum)
.    Echocardiogram done this hospitalization note similar to past with preserved ejection fraction and grade 1 diastolic dysfunction.  Euvolemic.  Patient will continue diuretic and ACE inhibitor plus beta-blocker

## 2021-11-10 NOTE — H&P (Addendum)
History and Physical    Patient: Aaron [name] JEH:[id number] DOB: 04-Aug-1965 DOA: 11/10/2021 DOS: the patient was seen and examined on 11/10/2021 PCP: [facility]  Patient coming from: Home  Chief Complaint:  Chief Complaint  Patient presents with   Irregular Heart Beat   HPI: ADIEN [name] is a 56 y.o. male with medical history significant of htn, obesity, nonischemic cardiomyopathy with a EF of 40% by left ventriculogram that was done in January 2000 22nd, kidney stones, obstructive sleep apnea, COVID-19 coming today for palpitations and dizziness.  Chart review shows patient has had a visit on September 22, 2021 for tachycardia and then followed up with cardiology on 28th.  Patient was again seen on May 21, and May 22 at which point patient  has an appointment with an EP cardiology. Patient was discharged after thorough evaluation with negative blood work and negative imaging concerning for any coronary ischemia.  Symptoms started about 2 pm this afternoon and HR was 150;s.  EKG showed a.flutter at 133.   Advised, d/w, and recommended : -Treatment for sleep apnea and Compliance with Bipap or to obtain oral appliance.  -lose weight, and suspect pt is a diabetic and would benefit from wegovy.  -sleep on side until OSA is treated . -recommended anticoagulation for a.fib/aflutter as he has a CHA2DS2-VASc score of 6 and is at high risk of stroke and VTE. -Pt also needs ENT eval for nasal septal deviation for osa treatment.     Review of Systems  Respiratory:  Positive for shortness of breath.   Cardiovascular:  Positive for palpitations.  Neurological:  Positive for dizziness.  All other systems reviewed and are negative.   Past Medical History:  Diagnosis Date   ADD (attention deficit disorder)    Chronic back pain    disc   Coronary artery disease    Crohn's disease ([place])    Heart murmur    HFrEF (heart failure with reduced ejection fraction) ([place])     Hypertension    Kidney stone    MDD (major depressive disorder)    Obesity    Past Surgical History:  Procedure Laterality Date   CARDIAC CATHETERIZATION     COLONOSCOPY N/A 06/14/2021   Procedure: COLONOSCOPY;  Surgeon: Annamaria Helling, DO;  Location: [facility];  Service: Gastroenterology;  Laterality: N/A;   ESOPHAGOGASTRODUODENOSCOPY N/A 06/14/2021   Procedure: ESOPHAGOGASTRODUODENOSCOPY (EGD);  Surgeon: Annamaria Helling, DO;  Location: [facility] ENDOSCOPY;  Service: Gastroenterology;  Laterality: N/A;   KIDNEY STONE SURGERY     LEFT HEART CATH AND CORONARY ANGIOGRAPHY Left 06/27/2020   Procedure: LEFT HEART CATH AND CORONARY ANGIOGRAPHY;  Surgeon: Nelva Bush, MD;  Location: [facility];  Service: Cardiovascular;  Laterality: Left;   VASECTOMY     Social History:  reports that he has never smoked. He has never used smokeless tobacco. He reports that he does not currently use alcohol. He reports that he does not use drugs.  No Known Allergies  Family History  Problem Relation Age of Onset   COPD Mother    Cancer Mother    Hypertension Father    Congestive Heart Failure Father    COPD Father    Lung cancer Father    Heart attack Father 46   Hypertension Sister    Diabetes Paternal Grandfather    Heart attack Paternal Grandfather    Healthy Son    Healthy Daughter     Prior to Admission medications   Medication  Sig Start Date End Date Taking? Authorizing Provider  acetaminophen (TYLENOL) 325 MG tablet Take 650-975 mg by mouth every 6 (six) hours as needed for moderate pain or headache (for pain.).    [provider]  albuterol (VENTOLIN HFA) 108 (90 Base) MCG/ACT inhaler Inhale 2 puffs into the lungs every 6 (six) hours as needed for wheezing or shortness of breath. 09/05/20   Parrett, Fonnie Mu, NP  cetirizine (ZYRTEC) 10 MG tablet Take 1 tablet (10 mg total) by mouth daily as needed (allergies.). 05/12/20   Enzo Bi, MD  dicyclomine (BENTYL) 10  MG capsule Take 10 mg by mouth 4 (four) times daily -  before meals and at bedtime. Patient not taking: Reported on 10/18/2021    [provider]  fluticasone (FLONASE) 50 MCG/ACT nasal spray Place 2 sprays into both nostrils daily as needed (allergies.). 05/12/20   Enzo Bi, MD  furosemide (LASIX) 40 MG tablet TAKE 1 TABLET BY MOUTH EVERY DAY 10/22/21   End, Harrell Gave, MD  ibuprofen (ADVIL) 600 MG tablet Take 600 mg by mouth every 6 (six) hours as needed. Patient not taking: Reported on 10/18/2021    [provider]  ipratropium (ATROVENT HFA) 17 MCG/ACT inhaler Inhale 2 puffs into the lungs every 6 (six) hours.    [provider]  losartan (COZAAR) 100 MG tablet TAKE 1 TABLET BY MOUTH EVERY DAY 07/30/21   End, Harrell Gave, MD  metoprolol succinate (TOPROL-XL) 50 MG 24 hr tablet TAKE 1 TABLET BY MOUTH DAILY. TAKE WITH OR IMMEDIATELY FOLLOWING A MEAL. 07/31/21   Vickie Epley, MD  montelukast (SINGULAIR) 10 MG tablet Take 1 tablet (10 mg total) by mouth at bedtime as needed (allergies.). 05/12/20   Enzo Bi, MD  oxyCODONE-acetaminophen (PERCOCET/ROXICET) 5-325 MG tablet Take 1 tablet by mouth every 4 (four) hours as needed for severe pain. 03/17/21   Paulette Blanch, MD  pantoprazole (PROTONIX) 40 MG tablet Take 1 tablet (40 mg total) by mouth daily. 08/08/21   Furth, Cadence H, PA-C  pregabalin (LYRICA) 100 MG capsule Take 100 mg by mouth at bedtime. 11/15/20   [provider]  spironolactone (ALDACTONE) 50 MG tablet TAKE 1 TABLET BY MOUTH EVERY DAY 10/22/21   End, Harrell Gave, MD  Vitamin D, Ergocalciferol, (DRISDOL) 1.25 MG (50000 UNIT) CAPS capsule Take 1 capsule by mouth once a week. 11/07/20   [provider]    Physical Exam: Vitals:   11/10/21 1830 11/10/21 1900 11/10/21 1911 11/10/21 1940  BP: 111/87 95/73 102/88 110/80  Pulse: (!) 131 (!) 132 (!) 131 (!) 129  Resp:  16    Temp:      TempSrc:      SpO2: 99% 98% 100% 98%  Weight:      Height:        Physical Exam Vitals and nursing note reviewed.  Constitutional:      General: He is not in acute distress.    Appearance: Normal appearance. He is obese. He is not ill-appearing, toxic-appearing or diaphoretic.  HENT:     Head: Normocephalic and atraumatic.     Right Ear: Hearing and external ear normal.     Left Ear: Hearing and external ear normal.     Nose: Nasal deformity and septal deviation present.     Mouth/Throat:     Lips: Pink.     Mouth: Mucous membranes are moist.     Tongue: No lesions.     Comments: Mallampati -4 Eyes:  Extraocular Movements: Extraocular movements intact.     Pupils: Pupils are equal, round, and reactive to light.  Neck:     Vascular: No carotid bruit.  Cardiovascular:     Rate and Rhythm: Tachycardia present. Rhythm irregularly irregular.     Pulses: Normal pulses.          Dorsalis pedis pulses are 2+ on the right side and 2+ on the left side.     Heart sounds: Normal heart sounds.  Pulmonary:     Effort: Pulmonary effort is normal.     Breath sounds: Normal breath sounds.  Abdominal:     General: Bowel sounds are normal. There is no distension.     Palpations: Abdomen is soft. There is no mass.     Tenderness: There is no abdominal tenderness. There is no guarding.     Hernia: No hernia is present.  Musculoskeletal:     Right lower leg: No edema.     Left lower leg: No edema.  Skin:    General: Skin is warm.  Neurological:     General: No focal deficit present.     Mental Status: He is alert and oriented to person, place, and time.     Cranial Nerves: Cranial nerves 2-12 are intact.     Motor: Motor function is intact.  Psychiatric:        Attention and Perception: Attention normal.        Mood and Affect: Mood normal.        Speech: Speech normal.        Behavior: Behavior normal. Behavior is cooperative.        Cognition and Memory: Cognition normal.     Data Reviewed: Results for orders placed or performed during  the hospital encounter of 11/10/21 (from the past 24 hour(s))  Basic metabolic panel     Status: Abnormal   Collection Time: 11/10/21  4:52 PM  Result Value Ref Range   Sodium 136 135 - 145 mmol/L   Potassium 4.0 3.5 - 5.1 mmol/L   Chloride 105 98 - 111 mmol/L   CO2 23 22 - 32 mmol/L   Glucose, Bld 110 (H) 70 - 99 mg/dL   BUN 25 (H) 6 - 20 mg/dL   Creatinine, Ser 1.01 0.61 - 1.24 mg/dL   Calcium 9.3 8.9 - 10.3 mg/dL   GFR, Estimated >60 >60 mL/min   Anion gap 8 5 - 15  CBC     Status: Abnormal   Collection Time: 11/10/21  4:52 PM  Result Value Ref Range   WBC 12.5 (H) 4.0 - 10.5 K/uL   RBC 5.41 4.22 - 5.81 MIL/uL   Hemoglobin 16.3 13.0 - 17.0 g/dL   HCT 48.2 39.0 - 52.0 %   MCV 89.1 80.0 - 100.0 fL   MCH 30.1 26.0 - 34.0 pg   MCHC 33.8 30.0 - 36.0 g/dL   RDW 12.0 11.5 - 15.5 %   Platelets 341 150 - 400 K/uL   nRBC 0.0 0.0 - 0.2 %  Troponin I (High Sensitivity)     Status: None   Collection Time: 11/10/21  4:52 PM  Result Value Ref Range   Troponin I (High Sensitivity) 11 <18 ng/L  Protime-INR (order if Patient is taking Coumadin / Warfarin)     Status: None   Collection Time: 11/10/21  4:52 PM  Result Value Ref Range   Prothrombin Time 13.6 11.4 - 15.2 seconds   INR 1.1 0.8 - 1.2  Assessment and Plan: * Atrial fibrillation with rapid ventricular response ([place]) .  Atrial flutter ([place]) .  Obstructive sleep apnea .  CAD in native artery .  Chronic HFrEF (heart failure with reduced ejection fraction) ([place]) .  GAD (generalized anxiety disorder) .  Essential hypertension .  Morbid obesity ([place]) .   >>A.fib rvr and a.flutter: Due to htn and obesity.  Continue Diltiazem drip. Start heparin gtt for vte.  Cardiology consulted- Dr. Rockey Situ. Pt needs ablation.  Thyroid function test.  Treat underlying etiology: Htn/ obesity./ osa. 2 L Post until Rate is stable.   >>OSA: Cpap per home settings.   >> CAD/ C/H HRrEF 40%: Prn lasix.  Repeat Echo with  bubble as pt is at high risk for strokes. Continue diltiazem. Hold cozaar/ metoprolol / aldactone to allow room for drip to prevent hypotension.  Strict I/O.  >>GAD: No anxiety meds in chart. Resume any once med rec is available.    >>HTN: Blood pressure 110/80, pulse (!) 129, temperature 98 F (36.7 C), temperature source Oral, resp. rate 16, height 5' 9"  (1.753 m), weight 131.5 kg, SpO2 98 %. Hold home regimen as above.   >>Morbid Obesity: Suspect underlying diabetes. D/w pt about weight loss.  We will repeat a1c. Last one was 6.2 in 2021.     Advance Care Planning:    Code Status: Full Code    Consults:  Cardiology: Dr. Candis Musa.   Family Communication:  AVENIR, LOZINSKI (Spouse)  [phone] (Mobile)  Severity of Illness: The appropriate patient status for this patient is OBSERVATION. Observation status is judged to be reasonable and necessary in order to provide the required intensity of service to ensure the patient's safety. The patient's presenting symptoms, physical exam findings, and initial radiographic and laboratory data in the context of their medical condition is felt to place them at decreased risk for further clinical deterioration. Furthermore, it is anticipated that the patient will be medically stable for discharge from the hospital within 2 midnights of admission.   Author: Para Skeans, MD 11/10/2021 8:19 PM  For on call review [email or web address].

## 2021-11-10 NOTE — Progress Notes (Signed)
@  2239 Pt converted to NSR.

## 2021-11-10 NOTE — ED Notes (Signed)
Received from [employer]. Pt AOX4, speaking in full sentences, in Afib rate 127, breathing easy and unlabored, speaking in full sentences, will continue to monitor.

## 2021-11-10 NOTE — Assessment & Plan Note (Addendum)
.    Meets criteria BMI greater than 40

## 2021-11-10 NOTE — Consult Note (Signed)
ANTICOAGULATION CONSULT NOTE - Initial Consult  Pharmacy Consult for Heparin infusion Indication: atrial fibrillation  No Known Allergies  Patient Measurements: Height: 5' 9"  (175.3 cm) Weight: 131.5 kg (290 lb) IBW/kg (Calculated) : 70.7 Heparin Dosing Weight: 101.3 kg  Vital Signs: Temp: 98 F (36.7 C) (06/10 1647) Temp Source: Oral (06/10 1647) BP: 110/80 (06/10 1940) Pulse Rate: 129 (06/10 1940)  Labs: Recent Labs    11/10/21 1652  HGB 16.3  HCT 48.2  PLT 341  LABPROT 13.6  INR 1.1  CREATININE 1.01  TROPONINIHS 11    Estimated Creatinine Clearance: 111 mL/min (by C-G formula based on SCr of 1.01 mg/dL).   Medical History: Past Medical History:  Diagnosis Date   ADD (attention deficit disorder)    Chronic back pain    disc   Coronary artery disease    Crohn's disease ([facility])    Heart murmur    HFrEF (heart failure with reduced ejection fraction) ([facility])    Hypertension    Kidney stone    MDD (major depressive disorder)    Obesity    Assessment: Patient with PMH of HTN, non-ischemic cardiomyopathy (EF 40%), CAD, and OSA. Presents to Emergency Department with new onset A.Fib with RVR. Pharmacy consulted to manage heparin infusion for A.Fib.  Goal of Therapy:  Heparin level 0.3-0.7 units/ml aPTT 66-102 seconds Monitor platelets by anticoagulation protocol: Yes   Plan:  Give 5000 units bolus x 1 Start heparin infusion at 1400 units/hr Check anti-Xa level in 6 hours and daily while on heparin Continue to monitor H&H and platelets  Corrin Sieling Rodriguez-Guzman PharmD, BCPS 11/10/2021 8:26 PM

## 2021-11-10 NOTE — Assessment & Plan Note (Addendum)
.    Started on flecainide by cardiology.  Outpatient follow-up.  Continue beta-blocker.

## 2021-11-10 NOTE — Assessment & Plan Note (Addendum)
Stable

## 2021-11-11 ENCOUNTER — Observation Stay (HOSPITAL_BASED_OUTPATIENT_CLINIC_OR_DEPARTMENT_OTHER)
Admit: 2021-11-11 | Discharge: 2021-11-11 | Disposition: A | Discharge status: Home / Self Care | Payer: Medicare Other | Attending: Internal Medicine | Admitting: Internal Medicine

## 2021-11-11 DIAGNOSIS — R002 Palpitations: Secondary | ICD-10-CM

## 2021-11-11 DIAGNOSIS — I471 Supraventricular tachycardia: Secondary | ICD-10-CM | POA: Diagnosis not present

## 2021-11-11 DIAGNOSIS — I4891 Unspecified atrial fibrillation: Secondary | ICD-10-CM | POA: Diagnosis not present

## 2021-11-11 DIAGNOSIS — I10 Essential (primary) hypertension: Secondary | ICD-10-CM | POA: Diagnosis not present

## 2021-11-11 DIAGNOSIS — E1169 Type 2 diabetes mellitus with other specified complication: Secondary | ICD-10-CM

## 2021-11-11 DIAGNOSIS — R0609 Other forms of dyspnea: Secondary | ICD-10-CM

## 2021-11-11 DIAGNOSIS — G4733 Obstructive sleep apnea (adult) (pediatric): Secondary | ICD-10-CM | POA: Diagnosis not present

## 2021-11-11 DIAGNOSIS — I5032 Chronic diastolic (congestive) heart failure: Secondary | ICD-10-CM

## 2021-11-11 LAB — HEPARIN LEVEL (UNFRACTIONATED)
Heparin Unfractionated: 0.52 IU/mL (ref 0.30–0.70)
Heparin Unfractionated: 0.47 IU/mL (ref 0.30–0.70)

## 2021-11-11 LAB — HIV ANTIBODY (ROUTINE TESTING W REFLEX): HIV Screen 4th Generation wRfx: NONREACTIVE

## 2021-11-11 LAB — ECHOCARDIOGRAM COMPLETE
AR max vel: 5.7 cm2
AV Peak grad: 2.5 mmHg
Ao pk vel: 0.79 m/s
Area-P 1/2: 2.71 cm2
Calc EF: 51.5 %
Single Plane A2C EF: 52.9 %
Single Plane A4C EF: 50.3 %

## 2021-11-11 LAB — HEMOGLOBIN A1C
Hgb A1c MFr Bld: 5.7 % — ABNORMAL HIGH (ref 4.8–5.6)
Mean Plasma Glucose: 116.89 mg/dL

## 2021-11-11 LAB — APTT: aPTT: >160 seconds (ref 24–36)

## 2021-11-11 MED ORDER — FLECAINIDE ACETATE 50 MG PO TABS: 50.0000 mg | ORAL_TABLET | Freq: Two times a day (BID) | ORAL | Status: DC

## 2021-11-11 MED ORDER — PREGABALIN 50 MG PO CAPS
100.0000 mg | ORAL_CAPSULE | Freq: Every day | ORAL | Status: DC
  Administered 2021-11-11: 50 mg via ORAL
  Filled 2021-11-11: qty 2

## 2021-11-11 MED ORDER — PANTOPRAZOLE SODIUM 40 MG PO TBEC
40.0000 mg | DELAYED_RELEASE_TABLET | Freq: Two times a day (BID) | ORAL | Status: DC
  Administered 2021-11-11: 40 mg via ORAL
  Filled 2021-11-11: qty 1

## 2021-11-11 MED ORDER — PERFLUTREN LIPID MICROSPHERE
1.0000 mL | INTRAVENOUS | Status: AC | PRN
  Administered 2021-11-11: 4.5 mL via INTRAVENOUS

## 2021-11-11 MED ORDER — METOPROLOL SUCCINATE ER 50 MG PO TB24
50.0000 mg | ORAL_TABLET | Freq: Every day | ORAL | Status: DC
  Administered 2021-11-11: 50 mg via ORAL
  Filled 2021-11-11: qty 1

## 2021-11-11 MED ORDER — OXYCODONE-ACETAMINOPHEN 5-325 MG PO TABS
1.0000 | ORAL_TABLET | ORAL | Status: DC | PRN
  Administered 2021-11-11: 1 via ORAL
  Filled 2021-11-11: qty 1

## 2021-11-11 MED ORDER — FLECAINIDE ACETATE 100 MG PO TABS: 100.0000 mg | ORAL_TABLET | Freq: Two times a day (BID) | ORAL | 2 refills | Status: DC

## 2021-11-11 NOTE — Progress Notes (Signed)
Critical aPtt >160 received @0318 . Sharion Settler, on-call for attending, paged to inform of critical. Provider promptly responded and endorsed to contact pharmacy. Pharmacist on-call notified. No new orders received. Will continue to monitor.

## 2021-11-11 NOTE — Discharge Summary (Signed)
Physician Discharge Summary   Patient: Aaron [name] MRN: [id number] DOB: 21-Feb-1966  Admit date:     11/10/2021  Discharge date: 11/11/21  Discharge Physician: Aaron [name]   PCP: [facility]   Recommendations at discharge:   New medication: Flecainide 100 mg p.o. twice daily Patient already has cardiology appointment scheduled for this week which she will keep  Discharge Diagnoses: Principal Problem:   Atrial fibrillation with rapid ventricular response ([place]) Active Problems:   Atrial flutter (HCC)   Obstructive sleep apnea   GAD (generalized anxiety disorder)   Essential hypertension   Chronic diastolic CHF (congestive heart failure) ([name])   CAD in native artery   Morbid obesity ([name])  Resolved Problems:   * No resolved hospital problems. *  Hospital Course: 56 year old male past medical history of morbid obesity systolic heart failure with ejection fraction of 40% and hypertension who presented to the emergency room with irregular heartbeat 6/10 rapid atrial flutter with a heart rate of 133.  Patient started on Cardizem drip & cardiology consulted.  Patient still having some hypotension and was changed from Cardizem over to amiodarone and infusion.  By night of 6/10, patient had converted to normal sinus rhythm.  Seen by cardiology in the morning and echocardiogram done which noted normal ejection fraction.  Patient started on flecainide twice daily and felt to be stable for discharge.  Patient to keep already scheduled cardiology appointment for this week.   Assessment and Plan: * Atrial fibrillation with rapid ventricular response (HCC) Initially requiring Cardizem drip and then with hypotension, changed over to amiodarone.  Now sinus rhythm..  Atrial flutter ([name]) .  Started on flecainide by cardiology.  Outpatient follow-up.  Continue beta-blocker.  Obstructive sleep apnea .  GAD (generalized anxiety disorder) .  Chronic diastolic CHF  (congestive heart failure) ([name]) .  Echocardiogram done this hospitalization note similar to past with preserved ejection fraction and grade 1 diastolic dysfunction.  Euvolemic.  Patient will continue diuretic and ACE inhibitor plus beta-blocker  Essential hypertension .  CAD in native artery .  Stable  Morbid obesity ([name]) .  Meets criteria BMI greater than 40         Consultants: Cardiology Procedures performed: Echocardiogram noting grade 1 diastolic dysfunction, preserved ejection fraction Disposition: Home Diet recommendation:  Discharge Diet Orders (From admission, onward)     Start     Ordered   11/11/21 0000  Diet - low sodium heart healthy        11/11/21 1410           Cardiac diet DISCHARGE MEDICATION: Allergies as of 11/11/2021   No Known Allergies      Medication List     TAKE these medications    acetaminophen 325 MG tablet Commonly known as: TYLENOL Take 650-975 mg by mouth every 6 (six) hours as needed for moderate pain or headache (for pain.).   albuterol 108 (90 Base) MCG/ACT inhaler Commonly known as: VENTOLIN HFA Inhale 2 puffs into the lungs every 6 (six) hours as needed for wheezing or shortness of breath.   cetirizine 10 MG tablet Commonly known as: ZYRTEC Take 1 tablet (10 mg total) by mouth daily as needed (allergies.).   flecainide 100 MG tablet Commonly known as: TAMBOCOR Take 1 tablet (100 mg total) by mouth 2 (two) times daily.   fluticasone 50 MCG/ACT nasal spray Commonly known as: FLONASE Place 2 sprays into both nostrils daily as needed (allergies.).   furosemide 40 MG tablet  Commonly known as: LASIX TAKE 1 TABLET BY MOUTH EVERY DAY   ipratropium 17 MCG/ACT inhaler Commonly known as: ATROVENT HFA Inhale 2 puffs into the lungs every 6 (six) hours.   losartan 100 MG tablet Commonly known as: COZAAR TAKE 1 TABLET BY MOUTH EVERY DAY   metoprolol succinate 50 MG 24 hr tablet Commonly known as: TOPROL-XL TAKE 1  TABLET BY MOUTH DAILY. TAKE WITH OR IMMEDIATELY FOLLOWING A MEAL. What changed: additional instructions   montelukast 10 MG tablet Commonly known as: SINGULAIR Take 1 tablet (10 mg total) by mouth at bedtime as needed (allergies.).   ondansetron 4 MG tablet Commonly known as: ZOFRAN Take 4 mg by mouth every 8 (eight) hours as needed for nausea/vomiting.   oxyCODONE-acetaminophen 5-325 MG tablet Commonly known as: PERCOCET/ROXICET Take 1 tablet by mouth every 4 (four) hours as needed for severe pain.   pregabalin 100 MG capsule Commonly known as: LYRICA Take 100 mg by mouth 2 (two) times daily.   spironolactone 50 MG tablet Commonly known as: ALDACTONE TAKE 1 TABLET BY MOUTH EVERY DAY   Vitamin D (Ergocalciferol) 1.25 MG (50000 UNIT) Caps capsule Commonly known as: DRISDOL Take 1 capsule by mouth once a week.        Follow-up Information     Aaron Epley, MD Follow up.   Specialties: Cardiology, Radiology Why: Keep appointment that you have this week with Dr. Vonzell [name] information: [facility] [place] [id number] [phone]                Discharge Exam: Danley Danker Weights   11/10/21 1648  Weight: 131.5 kg   General: Alert and oriented x3, no acute distress Cardiovascular: Regular rate and rhythm, S1-S2 Lungs: Clear to auscultation bilaterally  Condition at discharge: good  The results of significant diagnostics from this hospitalization (including imaging, microbiology, ancillary and laboratory) are listed below for reference.   Imaging Studies: ECHOCARDIOGRAM COMPLETE  Result Date: 11/11/2021    ECHOCARDIOGRAM REPORT   Patient Name:   Aaron [name] Date of Exam: 11/11/2021 Medical Rec #:  [id number]        Height:       69.0 in Accession #:    [id number]       Weight:       290.0 lb Date of Birth:  1966/05/31        BSA:          2.419 m Patient Age:    17 years         BP:           123/86 mmHg Patient Gender: M                 HR:           60 bpm. Exam Location:  [facility] Procedure: 2D Echo and Intracardiac Opacification Agent Indications:     Atrial Fibrillation I48.91  History:         Patient has no prior history of Echocardiogram examinations.  Sonographer:     Aaron [name] RDCS Referring Phys:  [place] Diagnosing Phys: Ida Rogue MD  Sonographer Comments: Technically challenging study due to limited acoustic windows and patient is morbidly obese. Image acquisition challenging due to patient body habitus. IMPRESSIONS  1. Left ventricular ejection fraction, by estimation, is 55%. The left ventricle has normal function. The left ventricle has no regional wall motion abnormalities. There is mild left ventricular hypertrophy. Left ventricular diastolic  parameters are consistent with Grade I diastolic dysfunction (impaired relaxation).  2. Right ventricular systolic function is normal. The right ventricular size is normal.  3. The mitral valve is normal in structure. Mild mitral valve regurgitation. No evidence of mitral stenosis.  4. The aortic valve is normal in structure. Aortic valve regurgitation is not visualized. No aortic stenosis is present.  5. There is mild dilatation of the aortic root, measuring 42 mm.  6. The inferior vena cava is normal in size with greater than 50% respiratory variability, suggesting right atrial pressure of 3 mmHg. FINDINGS  Left Ventricle: Left ventricular ejection fraction, by estimation, is 55 %. The left ventricle has normal function. The left ventricle has no regional wall motion abnormalities. Definity contrast agent was given IV to delineate the left ventricular endocardial borders. The left ventricular internal cavity size was normal in size. There is mild left ventricular hypertrophy. Left ventricular diastolic parameters are consistent with Grade I diastolic dysfunction (impaired relaxation). Right Ventricle: The right ventricular size is normal. No increase in right  ventricular wall thickness. Right ventricular systolic function is normal. Left Atrium: Left atrial size was normal in size. Right Atrium: Right atrial size was normal in size. Pericardium: There is no evidence of pericardial effusion. Mitral Valve: The mitral valve is normal in structure. Mild mitral valve regurgitation. No evidence of mitral valve stenosis. Tricuspid Valve: The tricuspid valve is normal in structure. Tricuspid valve regurgitation is not demonstrated. No evidence of tricuspid stenosis. Aortic Valve: The aortic valve is normal in structure. Aortic valve regurgitation is not visualized. No aortic stenosis is present. Aortic valve peak gradient measures 2.5 mmHg. Pulmonic Valve: The pulmonic valve was normal in structure. Pulmonic valve regurgitation is not visualized. No evidence of pulmonic stenosis. Aorta: The aortic root is normal in size and structure. There is mild dilatation of the aortic root, measuring 42 mm. Venous: The inferior vena cava is normal in size with greater than 50% respiratory variability, suggesting right atrial pressure of 3 mmHg. IAS/Shunts: No atrial level shunt detected by color flow Doppler.  LEFT VENTRICLE PLAX 2D LVOT diam:     2.50 cm     Diastology LV SV:         99          LV e' medial:    5.11 cm/s LV SV Index:   41          LV E/e' medial:  14.7 LVOT Area:     4.91 cm    LV e' lateral:   7.83 cm/s                            LV E/e' lateral: 9.6  LV Volumes (MOD) LV vol d, MOD A2C: 91.6 ml LV vol d, MOD A4C: 96.1 ml LV vol s, MOD A2C: 43.1 ml LV vol s, MOD A4C: 47.8 ml LV SV MOD A2C:     48.5 ml LV SV MOD A4C:     96.1 ml LV SV MOD BP:      50.3 ml RIGHT VENTRICLE RV Basal diam:  2.65 cm RV S prime:     12.30 cm/s TAPSE (M-mode): 1.6 cm LEFT ATRIUM           Index        RIGHT ATRIUM          Index LA Vol (A2C): 19.4 ml 8.02 ml/m   RA Area:  8.35 cm LA Vol (A4C): 37.1 ml 15.33 ml/m  RA Volume:   14.20 ml 5.87 ml/m  AORTIC VALVE AV Area (Vmax): 5.70 cm AV  Vmax:        78.70 cm/s AV Peak Grad:   2.5 mmHg LVOT Vmax:      91.40 cm/s LVOT Vmean:     55.900 cm/s LVOT VTI:       0.202 m  AORTA Ao Root diam: 4.20 cm MITRAL VALVE MV Area (PHT): 2.71 cm    SHUNTS MV Decel Time: 280 msec    Systemic VTI:  0.20 m MV E velocity: 75.00 cm/s  Systemic Diam: 2.50 cm MV A velocity: 80.60 cm/s MV E/A ratio:  0.93 Ida Rogue MD Electronically signed by Ida Rogue MD Signature Date/Time: 11/11/2021/1:49:58 PM    Final    DG Chest 2 View  Result Date: 11/10/2021 CLINICAL DATA:  Chest pain EXAM: CHEST - 2 VIEW COMPARISON:  10/21/2021 FINDINGS: Normal heart size and vascularity. Lateral pleural thickening again noted bilaterally. Minor basilar atelectasis versus scarring. Negative for pneumonia, edema, effusion, or pneumothorax. Trachea midline. Spondylosis noted of the spine. IMPRESSION: Stable exam.  No acute chest finding by plain radiography Electronically Signed   By: Jerilynn Mages.  Shick M.D.   On: 11/10/2021 17:11   CT Chest W Contrast  Result Date: 10/22/2021 CLINICAL DATA:  Chest pain. EXAM: CT CHEST WITH CONTRAST TECHNIQUE: Multidetector CT imaging of the chest was performed during intravenous contrast administration. RADIATION DOSE REDUCTION: This exam was performed according to the departmental dose-optimization program which includes automated exposure control, adjustment of the mA and/or kV according to patient size and/or use of iterative reconstruction technique. CONTRAST:  86m OMNIPAQUE IOHEXOL 300 MG/ML  SOLN COMPARISON:  Chest CT dated 05/11/2020 and radiograph dated 10/21/2021. FINDINGS: Cardiovascular: There is no cardiomegaly or pericardial effusion. The thoracic aorta is unremarkable. The origins of the great vessels of the aortic arch and the central pulmonary arteries appear patent. Mediastinum/Nodes: No hilar or mediastinal adenopathy. The esophagus is grossly unremarkable. Bilateral thyroid nodules measure up to 15 mm on the right. This has been evaluated  on previous imaging. (ref: J Am Coll Radiol. 2015 Feb;12(2): 143-50).No mediastinal fluid collection. Lungs/Pleura: The lungs are clear. There is no pleural effusion pneumothorax. The central airways are patent. 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 A 7 cm left renal cyst. Musculoskeletal: Degenerative changes of the spine. No acute osseous pathology. IMPRESSION: No acute intrathoracic pathology. Electronically Signed   By: AAnner CreteM.D.   On: 10/22/2021 01:56   DG Chest Portable 1 View  Result Date: 10/21/2021 CLINICAL DATA:  Chest pain EXAM: PORTABLE CHEST 1 VIEW COMPARISON:  09/22/2021 FINDINGS: Lungs are clear. No pneumothorax or pleural effusion. There is progressive widening of the right paratracheal soft tissues and large on the right hilum which may represent vascular shadow in the setting of an underlying aneurysm. This is not optimally assessed, particularly on this portable examination. Pulmonary vascularity is normal. No acute bone abnormality. IMPRESSION: No radiographic evidence of acute cardiopulmonary disease. Progressive widening of the right paratracheal soft tissues and right hilum which may represent vascular shadow or may be artifactual related to portable technique. A standard two view chest radiograph may be helpful for further evaluation. Alternatively, this may be better assessed with dedicated contrast enhanced CT imaging. Electronically Signed   By: AFidela SalisburyM.D.   On: 10/21/2021 22:38    Microbiology: Results for orders placed or  performed in visit on 10/18/21  Microscopic Examination     Status: None   Collection Time: 10/18/21  2:59 PM   Urine  Result Value Ref Range Status   WBC, UA 0-5 0 - 5 /hpf Final   RBC None seen 0 - 2 /hpf Final   Epithelial Cells (non renal) 0-10 0 - 10 /hpf Final   Bacteria, UA None seen None seen/Few Final    Labs: CBC: Recent Labs  Lab 11/10/21 1652  WBC 12.5*  HGB  16.3  HCT 48.2  MCV 89.1  PLT 800   Basic Metabolic Panel: Recent Labs  Lab 11/10/21 1652  NA 136  K 4.0  CL 105  CO2 23  GLUCOSE 110*  BUN 25*  CREATININE 1.01  CALCIUM 9.3   Liver Function Tests: No results for input(s): "AST", "ALT", "ALKPHOS", "BILITOT", "PROT", "ALBUMIN" in the last 168 hours. CBG: No results for input(s): "GLUCAP" in the last 168 hours.  Discharge time spent: less than 30 minutes.  Signed: Annita Brod, MD [facility] 11/11/2021

## 2021-11-11 NOTE — Consult Note (Signed)
ANTICOAGULATION CONSULT NOTE -   Pharmacy Consult for Heparin infusion Indication: atrial fibrillation  No Known Allergies  Patient Measurements: Height: 5' 9"  (175.3 cm) Weight: 131.5 kg (290 lb) IBW/kg (Calculated) : 70.7 Heparin Dosing Weight: 101.3 kg  Vital Signs: Temp: 98.3 F (36.8 C) (06/11 0745) Temp Source: Axillary (06/11 0108) BP: 136/95 (06/11 0745) Pulse Rate: 55 (06/11 0745)  Labs: Recent Labs    11/10/21 1652 11/10/21 1912 11/10/21 2140 11/11/21 0259 11/11/21 0847  HGB 16.3  --   --   --   --   HCT 48.2  --   --   --   --   PLT 341  --   --   --   --   APTT  --   --  >160*  --   --   LABPROT 13.6  --   --   --   --   INR 1.1  --   --   --   --   HEPARINUNFRC  --   --   --  0.52 0.47  CREATININE 1.01  --   --   --   --   TROPONINIHS 11 15  --   --   --      Estimated Creatinine Clearance: 111 mL/min (by C-G formula based on SCr of 1.01 mg/dL).   Medical History: Past Medical History:  Diagnosis Date   ADD (attention deficit disorder)    Chronic back pain    disc   Coronary artery disease    Crohn's disease (HCC)    Heart murmur    HFrEF (heart failure with reduced ejection fraction) (HCC)    Hypertension    Kidney stone    MDD (major depressive disorder)    Obesity    Assessment: Patient with PMH of HTN, non-ischemic cardiomyopathy (EF 40%), CAD, and OSA. Presents to Emergency Department with new onset A.Fib with RVR. Pharmacy consulted to manage heparin infusion for A.Fib.  6/11 0259 HL= 0.52, therapeutic X 1 6/11 0847 HL=0.47, therap x2  Goal of Therapy:  Heparin level 0.3-0.7 units/ml aPTT 66-102 seconds Monitor platelets by anticoagulation protocol: Yes   Plan:  6/11 0847 HL=0.47, therap x2 Will continue pt on current rate and f/u HL level with am labs. F/u for transition to oral anticoag CBC daily  Timoty Bourke A 11/11/2021 10:03 AM

## 2021-11-11 NOTE — TOC Progression Note (Signed)
Transition of Care [facility]) - Progression Note    Patient Details  Name: Aaron [name] MRN: [id number] Date of Birth: 1965/11/13  [facility] of [facility] CM/SW Contact  Izola Price, RN Phone Number: 11/11/2021, 12:46 PM  Clinical Narrative:  Unit RN inquired about patient's CPAP needing checking as it is 55 years old and has been sitting a while. Patient not sure of brand or if one of the recalled brands. Patient does have Medicare, which may require a new sleep study if new equipment needed or it may need a manufacturer replacement if one of the recalled versions. He did say there was a number on the side of CPAP. RN CM checked with [employer] and they do not have record on patient. Suggested to have patient call ordering provider, call number on CPAP, or have provider order new equipment/sleep study on discharge follow up. Simmie Davies RN CM         Expected Discharge Plan and Services                                                 Social Determinants of Health (SDOH) Interventions    Readmission Risk Interventions     No data to display

## 2021-11-11 NOTE — Progress Notes (Signed)
*  PRELIMINARY RESULTS* Echocardiogram 2D Echocardiogram has been performed. Definity IV ultrasound imaging agent used on this study.  Claretta Fraise 11/11/2021, 10:40 AM

## 2021-11-11 NOTE — Consult Note (Signed)
ANTICOAGULATION CONSULT NOTE - Initial Consult  Pharmacy Consult for Heparin infusion Indication: atrial fibrillation  No Known Allergies  Patient Measurements: Height: 5' 9"  (175.3 cm) Weight: 131.5 kg (290 lb) IBW/kg (Calculated) : 70.7 Heparin Dosing Weight: 101.3 kg  Vital Signs: Temp: 97.8 F (36.6 C) (06/11 0108) Temp Source: Axillary (06/11 0108) BP: 120/96 (06/11 0108) Pulse Rate: 55 (06/11 0108)  Labs: Recent Labs    11/10/21 1652 11/10/21 1912 11/10/21 2140 11/11/21 0259  HGB 16.3  --   --   --   HCT 48.2  --   --   --   PLT 341  --   --   --   APTT  --   --  >160*  --   LABPROT 13.6  --   --   --   INR 1.1  --   --   --   HEPARINUNFRC  --   --   --  0.52  CREATININE 1.01  --   --   --   TROPONINIHS 11 15  --   --      Estimated Creatinine Clearance: 111 mL/min (by C-G formula based on SCr of 1.01 mg/dL).   Medical History: Past Medical History:  Diagnosis Date   ADD (attention deficit disorder)    Chronic back pain    disc   Coronary artery disease    Crohn's disease (HCC)    Heart murmur    HFrEF (heart failure with reduced ejection fraction) (HCC)    Hypertension    Kidney stone    MDD (major depressive disorder)    Obesity    Assessment: Patient with PMH of HTN, non-ischemic cardiomyopathy (EF 40%), CAD, and OSA. Presents to Emergency Department with new onset A.Fib with RVR. Pharmacy consulted to manage heparin infusion for A.Fib.  Goal of Therapy:  Heparin level 0.3-0.7 units/ml aPTT 66-102 seconds Monitor platelets by anticoagulation protocol: Yes   Plan:  6/11: HL @ 0259 = 0.52, therapeutic X 1 Will continue pt on current rate and draw confirmation level on 6/11 @ 0900.   Spruha Weight D 11/11/2021 3:34 AM

## 2021-11-11 NOTE — Consult Note (Addendum)
Cardiology Consultation:   Patient ID: Aaron [name] MRN: [id number]; DOB: 12-09-1965  Admit date: 11/10/2021 Date of Consult: 11/11/2021  PCP:  [facility] [place] [facility] Cardiologist:  Nelva Bush, MD Physician requesting consult: Dr. Florina Ou Reason for consult: Narrow complex tachycardia  Patient Profile:   Aaron [name] is a 56 y.o. male with a hx of paroxysmal tachycardia, morbid obesity, borderline diabetes, sleep apnea not on CPAP, who presents for acute onset of tachycardia  History of Present Illness:   Aaron [name] reports that he was feeling well yesterday, around 3 PM got up from the couch, felt dizzy, appreciated palpitations, checked his heart rate running at 130, reports that he took extra half dose of his metoprolol succinate, 30 minutes later called EMS as rate did not improve. Reports having several episodes this year with similar presentation Most recently hospitalized April 2023 for similar presentation felt to have possible atrial tachycardia versus atypical flutter.   Additional episodes of tachycardia seen in the emergency room  September 22, 2021 presenting at 6 PM sudden onset tachycardia heart rate 130s, broke with IV metoprolol Oct 21, 2021 presented shortly after eating dinner, resolved without intervention  Here he was placed on diltiazem infusion for better rate control but blood pressure decreased and he was changed by hospitalist service over to amiodarone bolus with infusion.  Overnight converted to normal sinus rhythm   Past Medical History:  Diagnosis Date   ADD (attention deficit disorder)    Chronic back pain    disc   Coronary artery disease    Crohn's disease ([place])    Heart murmur    HFrEF (heart failure with reduced ejection fraction) ([place])    Hypertension    Kidney stone    MDD (major depressive disorder)    Obesity     Past Surgical History:  Procedure Laterality Date   CARDIAC  CATHETERIZATION     COLONOSCOPY N/A 06/14/2021   Procedure: COLONOSCOPY;  Surgeon: Annamaria Helling, DO;  Location: [facility];  Service: Gastroenterology;  Laterality: N/A;   ESOPHAGOGASTRODUODENOSCOPY N/A 06/14/2021   Procedure: ESOPHAGOGASTRODUODENOSCOPY (EGD);  Surgeon: Annamaria Helling, DO;  Location: [facility] END[facility];  Service: Gastroenterology;  Laterality: N/A;   KIDNEY STONE SURGERY     LEFT HEART CATH AND CORONARY ANGIOGRAPHY Left 06/27/2020   Procedure: LEFT HEART CATH AND CORONARY ANGIOGRAPHY;  Surgeon: Nelva Bush, MD;  Location: [facility];  Service: Cardiovascular;  Laterality: Left;   VASECTOMY       Home Medications:  Prior to Admission medications   Medication Sig Start Date End Date Taking? Authorizing Provider  metoprolol succinate (TOPROL-XL) 50 MG 24 hr tablet TAKE 1 TABLET BY MOUTH DAILY. TAKE WITH OR IMMEDIATELY FOLLOWING A MEAL. Patient taking differently: Take 50 mg by mouth daily. 07/31/21  Yes Vickie Epley, MD  oxyCODONE-acetaminophen (PERCOCET/ROXICET) 5-325 MG tablet Take 1 tablet by mouth every 4 (four) hours as needed for severe pain. 03/17/21  Yes Paulette Blanch, MD  pregabalin (LYRICA) 100 MG capsule Take 100 mg by mouth 2 (two) times daily. 11/15/20  Yes [provider]  acetaminophen (TYLENOL) 325 MG tablet Take 650-975 mg by mouth every 6 (six) hours as needed for moderate pain or headache (for pain.).    [provider]  albuterol (VENTOLIN HFA) 108 (90 Base) MCG/ACT inhaler Inhale 2 puffs into the lungs every 6 (six) hours as needed for wheezing or shortness of breath. 09/05/20   Parrett, Lynelle Smoke  S, NP  cetirizine (ZYRTEC) 10 MG tablet Take 1 tablet (10 mg total) by mouth daily as needed (allergies.). 05/12/20   Enzo Bi, MD  dicyclomine (BENTYL) 10 MG capsule Take 10 mg by mouth 4 (four) times daily -  before meals and at bedtime. Patient not taking: Reported on 10/18/2021    [provider]  fluticasone  (FLONASE) 50 MCG/ACT nasal spray Place 2 sprays into both nostrils daily as needed (allergies.). 05/12/20   Enzo Bi, MD  furosemide (LASIX) 40 MG tablet TAKE 1 TABLET BY MOUTH EVERY DAY Patient taking differently: Take 40 mg by mouth daily. 10/22/21   End, Harrell Gave, MD  ibuprofen (ADVIL) 600 MG tablet Take 600 mg by mouth every 6 (six) hours as needed. Patient not taking: Reported on 10/18/2021    [provider]  ipratropium (ATROVENT HFA) 17 MCG/ACT inhaler Inhale 2 puffs into the lungs every 6 (six) hours.    [provider]  losartan (COZAAR) 100 MG tablet TAKE 1 TABLET BY MOUTH EVERY DAY Patient taking differently: Take 100 mg by mouth daily. 07/30/21   End, Harrell Gave, MD  montelukast (SINGULAIR) 10 MG tablet Take 1 tablet (10 mg total) by mouth at bedtime as needed (allergies.). 05/12/20   Enzo Bi, MD  ondansetron (ZOFRAN) 4 MG tablet Take 4 mg by mouth every 8 (eight) hours as needed for nausea/vomiting. 10/22/21   [provider]  pantoprazole (PROTONIX) 40 MG tablet Take 1 tablet (40 mg total) by mouth daily. Patient not taking: Reported on 11/11/2021 08/08/21   Furth, Cadence H, PA-C  spironolactone (ALDACTONE) 50 MG tablet TAKE 1 TABLET BY MOUTH EVERY DAY Patient taking differently: Take 50 mg by mouth daily. 10/22/21   End, Harrell Gave, MD  Vitamin D, Ergocalciferol, (DRISDOL) 1.25 MG (50000 UNIT) CAPS capsule Take 1 capsule by mouth once a week. 11/07/20   [provider]    Inpatient Medications: Scheduled Meds:  metoprolol succinate  50 mg Oral Daily   pantoprazole  40 mg Oral BID   pregabalin  100 mg Oral QHS   Continuous Infusions:  PRN Meds: acetaminophen, albuterol, fluticasone, montelukast, ondansetron (ZOFRAN) IV, oxyCODONE-acetaminophen  Allergies:   No Known Allergies  Social History:   Social History   Socioeconomic History   Marital status: Widowed    Spouse name: carisa   Number of children: 2   Years of education: Not  on file   Highest education level: High school graduate  Occupational History   Not on file  Tobacco Use   Smoking status: Never   Smokeless tobacco: Never  Vaping Use   Vaping Use: Never used  Substance and Sexual Activity   Alcohol use: Not Currently   Drug use: Never   Sexual activity: Not Currently  Other Topics Concern   Not on file  Social History Narrative   Not on file   Social Determinants of Health   Financial Resource Strain: Low Risk  (09/02/2018)   Overall Financial Resource Strain (CARDIA)    Difficulty of Paying Living Expenses: Not hard at all  Food Insecurity: No Food Insecurity (09/02/2018)   Hunger Vital Sign    Worried About Running Out of Food in the Last Year: Never true    [place] in the Last Year: Never true  Transportation Needs: No Transportation Needs (09/02/2018)   PRAPARE - Hydrologist (Medical): No    Lack of Transportation (Non-Medical): No  Physical Activity: Insufficiently Active (09/02/2018)  Exercise Vital Sign    Days of Exercise per Week: 2 days    Minutes of Exercise per Session: 30 min  Stress: Stress Concern Present (09/02/2018)   [place]    Feeling of Stress : Very much  Social Connections: Unknown (09/02/2018)   Social Connection and Isolation Panel [NHANES]    Frequency of Communication with Friends and Family: Not on file    Frequency of Social Gatherings with Friends and Family: Not on file    Attends Religious Services: Never    Active Member of Clubs or Organizations: No    Attends Archivist Meetings: Never    Marital Status: Married  Human resources officer Violence: Not At Risk (09/02/2018)   Humiliation, Afraid, Rape, and Kick questionnaire    Fear of Current or Ex-Partner: No    Emotionally Abused: No    Physically Abused: No    Sexually Abused: No    Family History:    Family History  Problem Relation Age of Onset    COPD Mother    Cancer Mother    Hypertension Father    Congestive Heart Failure Father    COPD Father    Lung cancer Father    Heart attack Father 78   Hypertension Sister    Diabetes Paternal Grandfather    Heart attack Paternal Grandfather    Healthy Son    Healthy Daughter      ROS:  Please see the history of present illness.  Review of Systems  Constitutional: Negative.   HENT: Negative.    Respiratory: Negative.    Cardiovascular: Negative.   Gastrointestinal: Negative.   Musculoskeletal: Negative.   Neurological: Negative.   Psychiatric/Behavioral: Negative.    All other systems reviewed and are negative.   Physical Exam/Data:   Vitals:   11/11/21 0508 11/11/21 0745 11/11/21 1042 11/11/21 1227  BP: 123/86 (!) 136/95  126/86  Pulse: (!) 49 (!) 55 61 (!) 57  Resp: 20 17  17   Temp:  98.3 F (36.8 C)    TempSrc:      SpO2: 100% 99%  96%  Weight:      Height:        Intake/Output Summary (Last 24 hours) at 11/11/2021 1338 Last data filed at 11/11/2021 0700 Gross per 24 hour  Intake 2025.08 ml  Output --  Net 2025.08 ml      11/10/2021    4:48 PM 10/21/2021   10:09 PM 10/18/2021    3:30 PM  Last 3 Weights  Weight (lbs) 290 lb 290 lb 291 lb  Weight (kg) 131.543 kg 131.543 kg 131.997 kg     Body mass index is 42.83 kg/m.  General:  Well nourished, well developed, in no acute distress HEENT: normal Neck: no JVD Vascular: No carotid bruits; Distal pulses 2+ bilaterally Cardiac:  normal S1, S2; RRR; no murmur  Lungs:  clear to auscultation bilaterally, no wheezing, rhonchi or rales  Abd: soft, nontender, no hepatomegaly  Ext: no edema Musculoskeletal:  No deformities, BUE and BLE strength normal and equal Skin: warm and dry  Neuro:  CNs 2-12 intact, no focal abnormalities noted Psych:  Normal affect   EKG:  The EKG was personally reviewed and demonstrates:   Narrow complex tachycardia rate 132 bpm, rhythm concerning for atrial  tachycardia  Telemetry:  Telemetry was personally reviewed and demonstrates:   Normal sinus rhythm  Relevant CV Studies: Echocardiogram Normalized ejection fraction estimated 55%, no focal  wall motion abnormalities  Laboratory Data:  High Sensitivity Troponin:   Recent Labs  Lab 10/21/21 2306 10/22/21 0051 11/10/21 1652 11/10/21 1912  TROPONINIHS 11 12 11 15      Chemistry Recent Labs  Lab 11/10/21 1652  NA 136  K 4.0  CL 105  CO2 23  GLUCOSE 110*  BUN 25*  CREATININE 1.01  CALCIUM 9.3  GFRNONAA >60  ANIONGAP 8    No results for input(s): "PROT", "ALBUMIN", "AST", "ALT", "ALKPHOS", "BILITOT" in the last 168 hours. Lipids No results for input(s): "CHOL", "TRIG", "HDL", "LABVLDL", "LDLCALC", "CHOLHDL" in the last 168 hours.  Hematology Recent Labs  Lab 11/10/21 1652  WBC 12.5*  RBC 5.41  HGB 16.3  HCT 48.2  MCV 89.1  MCH 30.1  MCHC 33.8  RDW 12.0  PLT 341   Thyroid  Recent Labs  Lab 11/10/21 1912  TSH 0.427  FREET4 1.56*    BNP Recent Labs  Lab 11/10/21 2140  BNP 122.5*    DDimer  Recent Labs  Lab 11/10/21 1652  DDIMER 0.34     Radiology/Studies:  DG Chest 2 View  Result Date: 11/10/2021 CLINICAL DATA:  Chest pain EXAM: CHEST - 2 VIEW COMPARISON:  10/21/2021 FINDINGS: Normal heart size and vascularity. Lateral pleural thickening again noted bilaterally. Minor basilar atelectasis versus scarring. Negative for pneumonia, edema, effusion, or pneumothorax. Trachea midline. Spondylosis noted of the spine. IMPRESSION: Stable exam.  No acute chest finding by plain radiography Electronically Signed   By: Jerilynn Mages.  Shick M.D.   On: 11/10/2021 17:11     Assessment and Plan:   Paroxysmal tachycardia/atrial tachycardia Recurrent episodes over the past several months leading to numerous trips to the emergency room He has appointment with Dr. Quentin Ore, EP this week Not a great candidate for amiodarone given his young age As ejection fraction appears to have  normalized on echocardiogram, recommend we continue metoprolol succinate 50 mg daily with flecainide 50 twice daily(after discussion with pharmacy, will use reduced dose given recent use of amiodarone.  Flecainide dose could be increased as outpatient) -No indication of atrial fibrillation/flutter, will hold heparin, no indication for NOAC  Obstructive sleep apnea Noncompliant with his CPAP Discussed with him in detail  Morbid obesity We have encouraged continued exercise, careful diet management in an effort to lose weight.  Borderline diabetes A1c 6.1 trending down to 5.7 Low carbohydrate diet recommended    Total encounter time more than 80 minutes  Greater than 50% was spent in counseling and coordination of care with the patient   For questions or updates, please contact [place] Please consult [email or web address] for contact info under    Signed, Ida Rogue, MD  11/11/2021 1:38 PM

## 2021-11-12 ENCOUNTER — Telehealth: Payer: Self-pay | Admitting: Internal Medicine

## 2021-11-12 ENCOUNTER — Telehealth: Payer: Self-pay | Admitting: Pulmonary Disease

## 2021-11-12 NOTE — Telephone Encounter (Signed)
Spoke with patient and he reports being admitted for fast heart rates up to 130. Patient reports being started on Flecainide while in the hospital and now his rates are low. He states that his normal range is mid to high 50's. He reports when sitting or resting his rates go down into the 40's. Patient denies any symptoms with these low rates but would like to know if he should stop flecainide or continue taking. He does have appointment on 6/14 with Dr. Quentin Ore and then one in August with Dr. Saunders Revel. Advised that I would send this message to his provider for review and further recommendations. He verbalized understanding with no further questions at this time.

## 2021-11-12 NOTE — Telephone Encounter (Signed)
Patient last seen 10/17/2021 with pending appt 01/28/2022.  Spoke to patient. He stated that his current cpap machine has been recalled. It does not appear that our office ordered machine originally. He stated that he contacted [facility] today regarding recall, and he was advised that an order is needed since current machine is >5y.  He is aware that an appt is needed prior to placing an order.  He will keep scheduled visit for 01/28/2022. Nothing further needed.

## 2021-11-12 NOTE — Telephone Encounter (Signed)
  STAT if HR is under 50 or over 120 (normal HR is 60-100 beats per minute)  What is your heart rate? 54 resting sitting.   Do you have a log of your heart rate readings (document readings)?   Do you have any other symptoms? Patient was admitted over the weekend for rapid HR.  He was prescribed Flecainide Acetate 50MG.  He wants to know if a resting HR 40 at night when sleeping is normal.  He did take a pill this morning.  He was prescribed to take one in the morning and one at night.

## 2021-11-13 NOTE — Telephone Encounter (Signed)
Spoke w/ Aaron [name].  Advised him of Aaron [name] recommendation. He verbalizes understanding and will keep upcoming appt w/ Aaron [name].

## 2021-11-13 NOTE — Telephone Encounter (Signed)
It is possible that current regimen of metoprolol and flecainide is contributing to lower resting heart rates.  As long as he is not symptomatic (e.g. lightheaded, short of breath, significantly more fatigued), I would favor continuing his current medications until he is seen tomorrow by Dr. Quentin Ore.  Nelva Bush, MD [facility]

## 2021-11-14 ENCOUNTER — Ambulatory Visit (INDEPENDENT_AMBULATORY_CARE_PROVIDER_SITE_OTHER): Payer: Medicare Other | Admitting: Cardiology

## 2021-11-14 ENCOUNTER — Encounter: Payer: Self-pay | Admitting: Cardiology

## 2021-11-14 VITALS — BP 108/74 | HR 58 | Ht 69.0 in | Wt 290.0 lb

## 2021-11-14 DIAGNOSIS — I471 Supraventricular tachycardia: Secondary | ICD-10-CM | POA: Diagnosis not present

## 2021-11-14 DIAGNOSIS — G4733 Obstructive sleep apnea (adult) (pediatric): Secondary | ICD-10-CM

## 2021-11-14 MED ORDER — FLECAINIDE ACETATE 50 MG PO TABS: 50.0000 mg | ORAL_TABLET | Freq: Two times a day (BID) | ORAL | 3 refills | Status: DC

## 2021-11-14 NOTE — Patient Instructions (Addendum)
Medications: Reduce Flecainide to 50 mg two times a day Your physician recommends that you continue on your current medications as directed. Please refer to the Current Medication list given to you today. *If you need a refill on your cardiac medications before your next appointment, please call your pharmacy*  Lab Work: None.  If you have labs (blood work) drawn today and your tests are completely normal, you will receive your results only by: [place] (if you have MyChart) OR A paper copy in the mail If you have any lab test that is abnormal or we need to change your treatment, we will call you to review the results.  Testing/Procedures: Your physician has requested that you have an exercise tolerance test. 5-7 days  Follow-Up: At [facility], you and your health needs are our priority.  As part of our continuing mission to provide you with exceptional heart care, we have created designated Provider Care Teams.  These Care Teams include your primary Cardiologist (physician) and Advanced Practice Providers (APPs -  Physician Assistants and Nurse Practitioners) who all work together to provide you with the care you need, when you need it.  Your physician wants you to follow-up in: 4-6 weeks with Lars Mage, MD   We recommend signing up for the patient portal called "MyChart".  Sign up information is provided on this After Visit Summary.  MyChart is used to connect with patients for Virtual Visits (Telemedicine).  Patients are able to view lab/test results, encounter notes, upcoming appointments, etc.  Non-urgent messages can be sent to your provider as well.   To learn more about what you can do with MyChart, go to [email or web address].    Any Other Special Instructions Will Be Listed Below (If Applicable).  - you may eat a light breakfast/ lunch prior to your procedure - no caffeine for 24 hours prior to your test (coffee, tea, soft drinks, or chocolate)  - no smoking/  vaping for 4 hours prior to your test - you may take your regular medications the day of your test except for: DO NOT take metoprolol Succinate for 24 hours prior - bring any inhalers with you to your test - wear comfortable clothing & tennis/ non-skid shoes to walk on the treadmill

## 2021-11-14 NOTE — Progress Notes (Signed)
Electrophysiology Office Note:    Date:  11/14/2021   ID:  Aaron [name], DOB May 03, 1966, MRN [id number]  PCP:  [place] Cardiologist:  Nelva Bush, MD  [facility] Electrophysiologist:  Vickie Epley, MD   Referring MD: [facility]*   Chief Complaint: SVT  History of Present Illness:    Aaron [name] is a 56 y.o. male who presents for an evaluation of SVT at the request of Dr. Saunders Revel. Their medical history includes obesity, Crohn's disease, hypertension, palpitations.  The patient is with his wife today.  The patient has a long history of palpitations.  Based on his history there are 2 separate types of palpitations he experiences.  The more frequent palpitations occur with meals.  He tells me that when he eats a meal his heart rates will increase into the 80s and 90s beats per minute from a baseline of 50s beats per minute.  This gradually we will slow down back to his resting heart rate in the 50s over the course of several hours after a meal.  Since he is started decreasing the amount of food with each meal, these spikes in heart rates have improved and he is less symptomatic.  Separate from the above heart rate elevations with meals he has abrupt onset and offset episodes of tachycardia that occur independent of mealtimes.  Heart rates during these episodes will go up to the 130s or 140s.  They are accompanied by lightheadedness and dizziness and presyncope.  These been captured on multiple EKGs in the hospital.  No clear triggers for these episodes.  Since starting flecainide, he has not had another episode of the rapid heart rates to the 130s or 140s.  He is tolerating flecainide well without off target effects.  He also takes metoprolol succinate without off target effects.  He tells me that he is recently started Ozempic for weight loss.  He is also restarted his sleep apnea treatment with a CPAP.     Past Medical  History:  Diagnosis Date   ADD (attention deficit disorder)    Chronic back pain    disc   Coronary artery disease    Crohn's disease ([name])    Heart murmur    HFrEF (heart failure with reduced ejection fraction) ([place])    Hypertension    Kidney stone    MDD (major depressive disorder)    Obesity     Past Surgical History:  Procedure Laterality Date   CARDIAC CATHETERIZATION     COLONOSCOPY N/A 06/14/2021   Procedure: COLONOSCOPY;  Surgeon: Annamaria Helling, DO;  Location: [place];  Service: Gastroenterology;  Laterality: N/A;   ESOPHAGOGASTRODUODENOSCOPY N/A 06/14/2021   Procedure: ESOPHAGOGASTRODUODENOSCOPY (EGD);  Surgeon: Annamaria Helling, DO;  Location: [facility];  Service: Gastroenterology;  Laterality: N/A;   KIDNEY STONE SURGERY     LEFT HEART CATH AND CORONARY ANGIOGRAPHY Left 06/27/2020   Procedure: LEFT HEART CATH AND CORONARY ANGIOGRAPHY;  Surgeon: Nelva Bush, MD;  Location: [facility];  Service: Cardiovascular;  Laterality: Left;   VASECTOMY      Current Medications: Current Meds  Medication Sig   acetaminophen (TYLENOL) 325 MG tablet Take 650-975 mg by mouth every 6 (six) hours as needed for moderate pain or headache (for pain.).   albuterol (VENTOLIN HFA) 108 (90 Base) MCG/ACT inhaler Inhale 2 puffs into the lungs every 6 (six) hours as needed for wheezing or shortness of breath.   cetirizine (ZYRTEC)  10 MG tablet Take 1 tablet (10 mg total) by mouth daily as needed (allergies.).   flecainide (TAMBOCOR) 50 MG tablet Take 1 tablet (50 mg total) by mouth 2 (two) times daily.   fluticasone (FLONASE) 50 MCG/ACT nasal spray Place 2 sprays into both nostrils daily as needed (allergies.).   furosemide (LASIX) 40 MG tablet TAKE 1 TABLET BY MOUTH EVERY DAY   ipratropium (ATROVENT HFA) 17 MCG/ACT inhaler Inhale 2 puffs into the lungs every 6 (six) hours.   losartan (COZAAR) 100 MG tablet TAKE 1 TABLET BY MOUTH EVERY DAY   metoprolol  succinate (TOPROL-XL) 50 MG 24 hr tablet TAKE 1 TABLET BY MOUTH DAILY. TAKE WITH OR IMMEDIATELY FOLLOWING A MEAL.   montelukast (SINGULAIR) 10 MG tablet Take 1 tablet (10 mg total) by mouth at bedtime as needed (allergies.).   ondansetron (ZOFRAN) 4 MG tablet Take 4 mg by mouth every 8 (eight) hours as needed for nausea/vomiting.   oxyCODONE-acetaminophen (PERCOCET/ROXICET) 5-325 MG tablet Take 1 tablet by mouth every 4 (four) hours as needed for severe pain.   pregabalin (LYRICA) 100 MG capsule Take 100 mg by mouth 2 (two) times daily.   Semaglutide,0.25 or 0.5MG/DOS, (OZEMPIC, 0.25 OR 0.5 MG/DOSE,) 2 MG/1.5ML SOPN Inject into the skin once a week.   spironolactone (ALDACTONE) 50 MG tablet TAKE 1 TABLET BY MOUTH EVERY DAY   Vitamin D, Ergocalciferol, (DRISDOL) 1.25 MG (50000 UNIT) CAPS capsule Take 1 capsule by mouth once a week.   [DISCONTINUED] flecainide (TAMBOCOR) 100 MG tablet Take 1 tablet (100 mg total) by mouth 2 (two) times daily.     Allergies:   Patient has no known allergies.   Social History   Socioeconomic History   Marital status: Widowed    Spouse name: carisa   Number of children: 2   Years of education: Not on file   Highest education level: High school graduate  Occupational History   Not on file  Tobacco Use   Smoking status: Never   Smokeless tobacco: Never  Vaping Use   Vaping Use: Never used  Substance and Sexual Activity   Alcohol use: Not Currently   Drug use: Never   Sexual activity: Not Currently  Other Topics Concern   Not on file  Social History Narrative   Not on file   Social Determinants of Health   Financial Resource Strain: Low Risk  (09/02/2018)   Overall Financial Resource Strain (CARDIA)    Difficulty of Paying Living Expenses: Not hard at all  Food Insecurity: No Food Insecurity (09/02/2018)   Hunger Vital Sign    Worried About Running Out of Food in the Last Year: Never true    [place] in the Last Year: Never true   Transportation Needs: No Transportation Needs (09/02/2018)   PRAPARE - Hydrologist (Medical): No    Lack of Transportation (Non-Medical): No  Physical Activity: Insufficiently Active (09/02/2018)   Exercise Vital Sign    Days of Exercise per Week: 2 days    Minutes of Exercise per Session: 30 min  Stress: Stress Concern Present (09/02/2018)   [place]    Feeling of Stress : Very much  Social Connections: Unknown (09/02/2018)   Social Connection and Isolation Panel [NHANES]    Frequency of Communication with Friends and Family: Not on file    Frequency of Social Gatherings with Friends and Family: Not on file    Attends  Religious Services: Never    Active Member of Clubs or Organizations: No    Attends Archivist Meetings: Never    Marital Status: Married     Family History: The patient's family history includes COPD in his father and mother; Cancer in his mother; Congestive Heart Failure in his father; Diabetes in his paternal grandfather; Healthy in his daughter and son; Heart attack in his paternal grandfather; Heart attack (age of onset: 50) in his father; Hypertension in his father and sister; Lung cancer in his father.  ROS:   Please see the history of present illness.    All other systems reviewed and are negative.  EKGs/Labs/Other Studies Reviewed:    The following studies were reviewed today:  November 10, 2021 EKG from emergency department reviewed and shows a narrow complex regular tachycardia with ventricular rate around 130 bpm.  EKG:  The ekg ordered today demonstrates sinus rhythm.  First-degree AV delay with a PR interval of 302 ms.  QRS duration 88 ms.   Recent Labs: 10/21/2021: ALT 40 10/22/2021: Magnesium 2.6 11/10/2021: B Natriuretic Peptide 122.5; BUN 25; Creatinine, Ser 1.01; Hemoglobin 16.3; Platelets 341; Potassium 4.0; Sodium 136; TSH 0.427  Recent Lipid  Panel    Component Value Date/Time   CHOL 96 05/19/2020 0500   CHOL 117 11/17/2019 1003   TRIG 70 05/19/2020 0500   HDL 27 (L) 05/19/2020 0500   HDL 31 (L) 11/17/2019 1003   CHOLHDL 3.6 05/19/2020 0500   VLDL 14 05/19/2020 0500   LDLCALC 55 05/19/2020 0500   LDLCALC 64 11/17/2019 1003    Physical Exam:    VS:  BP 108/74   Pulse (!) 58   Ht 5' 9"  (1.753 m)   Wt 290 lb (131.5 kg)   SpO2 99%   BMI 42.83 kg/m     Wt Readings from Last 3 Encounters:  11/14/21 290 lb (131.5 kg)  11/10/21 290 lb (131.5 kg)  10/21/21 290 lb (131.5 kg)     GEN:  Well nourished, well developed in no acute distress.  Morbidly obese HEENT: Normal NECK: No JVD; No carotid bruits LYMPHATICS: No lymphadenopathy CARDIAC: RRR, no murmurs, rubs, gallops RESPIRATORY:  Clear to auscultation without rales, wheezing or rhonchi  ABDOMEN: Soft, non-tender, non-distended MUSCULOSKELETAL:  No edema; No deformity  SKIN: Warm and dry NEUROLOGIC:  Alert and oriented x 3 PSYCHIATRIC:  Normal affect       ASSESSMENT:    1. PSVT (paroxysmal supraventricular tachycardia) ([place])   2. Obstructive sleep apnea   3. Morbid obesity ([place])    PLAN:    In order of problems listed above:  #SVT Has SVT with a heart rate in the 130s is most likely an atrial tachycardia but I cannot completely exclude other mechanisms of SVT such as AVNRT or AVRT.  The response to flecainide is supportive of a diagnosis of atrial tachycardia.  Given his weight, he is a poor candidate for EP study and ablation.  I would favor continuing a class Ic agent paired with a beta-blocker.  I will decrease his dose of flecainide to 75 mg by mouth twice daily given the PR prolongation on today's EKG.  I will have him do an exercise ECG to assess for the electrical instability of the heart with exertion while on flecainide.  He will follow-up in 4 to 6 weeks to review the results.  The increase in heart rate with meals is normal physiology.  I would  recommend he continue limiting the  quantity of food he has with each meal to help minimize the symptoms and also assist with weight loss.  #Obstructive sleep apnea CPAP use encouraged.  #Obesity Weight loss encouraged.       Total time spent with patient today 60 minutes. This includes reviewing records, evaluating the patient and coordinating care.  Medication Adjustments/Labs and Tests Ordered: Current medicines are reviewed at length with the patient today.  Concerns regarding medicines are outlined above.  Orders Placed This Encounter  Procedures   Exercise Tolerance Test   EKG 12-Lead   Meds ordered this encounter  Medications   flecainide (TAMBOCOR) 50 MG tablet    Sig: Take 1 tablet (50 mg total) by mouth 2 (two) times daily.    Dispense:  180 tablet    Refill:  3     Signed, Serapio Edelson T. Quentin Ore, MD, [facility], [facility] 11/14/2021 9:41 PM    Electrophysiology [facility]

## 2021-11-15 ENCOUNTER — Ambulatory Visit: Payer: Medicare Other | Admitting: Medical

## 2021-11-15 ENCOUNTER — Telehealth: Payer: Self-pay | Admitting: Cardiology

## 2021-11-15 MED ORDER — FLECAINIDE ACETATE 50 MG PO TABS: 75.0000 mg | ORAL_TABLET | Freq: Two times a day (BID) | ORAL | 3 refills | Status: DC

## 2021-11-15 MED ORDER — FLECAINIDE ACETATE 50 MG PO TABS: 50.0000 mg | ORAL_TABLET | Freq: Two times a day (BID) | ORAL | 3 refills | Status: DC

## 2021-11-15 NOTE — Telephone Encounter (Signed)
The patient called because he was take 50 mg BID when he left the hospital not 100 mg BID. Made Dr. Quentin Ore aware. Would like patient to continue 50 mg BID with no changes and continue plan with stress test.

## 2021-11-15 NOTE — Addendum Note (Signed)
Addended by: Saddie Benders E on: 11/15/2021 11:41 AM   Modules accepted: Orders

## 2021-11-15 NOTE — Telephone Encounter (Signed)
Pt c/o medication issue:  1. Name of Medication:   flecainide (TAMBOCOR) 50 MG tablet    2. How are you currently taking this medication (dosage and times per day)? 50 mg, twice daily  3. Are you having a reaction (difficulty breathing--STAT)? No  4. What is your medication issue? Patient would like clarification on dosage instructions

## 2021-11-28 ENCOUNTER — Telehealth: Payer: Self-pay | Admitting: Emergency Medicine

## 2021-11-28 NOTE — Telephone Encounter (Signed)
Called patient to remind him of appointment and go over treadmill stress test instructions. No answer. Lmtcb.   Attestation order has been completed.   Your physician has requested that you have an exercise tolerance test (treadmill stress test).  - Do not take metoprolol succinate prior to test - you may eat a light breakfast/ lunch prior to your procedure - no caffeine for 24 hours prior to your test (coffee, tea, soft drinks, or chocolate)  - no smoking/ vaping for 4 hours prior to your test - you may take your regular medications the day of your test - bring any inhalers with you to your test - wear comfortable clothing & tennis/ non-skid shoes to walk on the treadmill

## 2021-11-28 NOTE — Addendum Note (Signed)
Addended by: Saddie Benders E on: 11/28/2021 03:00 PM   Modules accepted: Orders

## 2021-11-28 NOTE — Addendum Note (Signed)
Addended by: Lars Mage T on: 11/28/2021 03:00 PM   Modules accepted: Orders

## 2021-11-29 ENCOUNTER — Ambulatory Visit (INDEPENDENT_AMBULATORY_CARE_PROVIDER_SITE_OTHER): Payer: Medicare Other

## 2021-11-29 ENCOUNTER — Telehealth: Payer: Self-pay | Admitting: Cardiology

## 2021-11-29 DIAGNOSIS — I471 Supraventricular tachycardia: Secondary | ICD-10-CM | POA: Diagnosis not present

## 2021-11-29 NOTE — Telephone Encounter (Signed)
Spoke with the patient. Reviewed the patients pre-test instructions for his gxt this afternoon at 3pm. Confirmed appt with the patient adv him to arrive at 2:45pm. Patient verbalized understanding. Patient has taken his Metoprolol this morning. Ok to proceed with gxt since this is for flecainide evaluation.

## 2021-11-29 NOTE — Telephone Encounter (Signed)
Duplicate encounter. See 11/28/21 telephone encounter for documentation.

## 2021-11-29 NOTE — Telephone Encounter (Signed)
Follow Up:      Patient is returning a call from yesterday.

## 2021-12-05 ENCOUNTER — Telehealth: Payer: Self-pay | Admitting: Internal Medicine

## 2021-12-05 MED ORDER — FLECAINIDE ACETATE 50 MG PO TABS: 50.0000 mg | ORAL_TABLET | Freq: Two times a day (BID) | ORAL | 0 refills | Status: DC

## 2021-12-05 NOTE — Telephone Encounter (Signed)
*  STAT* If patient is at the pharmacy, call can be transferred to refill team.   1. Which medications need to be refilled? (please list name of each medication and dose if known) flecainide (TAMBOCOR) 50 MG tablet  2. Which pharmacy/location (including street and city if local pharmacy) is medication to be sent to? [id number] [place] [place]. Do they need a 30 day or 90 day supply? [address]

## 2021-12-05 NOTE — Telephone Encounter (Signed)
Requested Prescriptions   Signed Prescriptions Disp Refills   flecainide (TAMBOCOR) 50 MG tablet 180 tablet 0    Sig: Take 1 tablet (50 mg total) by mouth 2 (two) times daily.    Authorizing Provider: Vickie Epley    Ordering User: Raelene Bott, Alajah Witman L

## 2021-12-12 ENCOUNTER — Other Ambulatory Visit: Payer: Self-pay

## 2021-12-12 ENCOUNTER — Emergency Department
Admission: EM | Admit: 2021-12-12 | Discharge: 2021-12-12 | Discharge status: Against Medical Advice | Payer: Medicare Other | Attending: Emergency Medicine | Admitting: Emergency Medicine

## 2021-12-12 ENCOUNTER — Emergency Department: Payer: Medicare Other

## 2021-12-12 ENCOUNTER — Encounter: Payer: Self-pay | Admitting: Emergency Medicine

## 2021-12-12 DIAGNOSIS — R42 Dizziness and giddiness: Secondary | ICD-10-CM | POA: Insufficient documentation

## 2021-12-12 DIAGNOSIS — Z5321 Procedure and treatment not carried out due to patient leaving prior to being seen by health care provider: Secondary | ICD-10-CM | POA: Diagnosis not present

## 2021-12-12 LAB — CBC WITH DIFFERENTIAL/PLATELET
Abs Immature Granulocytes: 0.07 10*3/uL (ref 0.00–0.07)
Basophils Absolute: 0.1 10*3/uL (ref 0.0–0.1)
Basophils Relative: 1 %
Eosinophils Absolute: 0.4 10*3/uL (ref 0.0–0.5)
Eosinophils Relative: 3 %
HCT: 48.7 % (ref 39.0–52.0)
Hemoglobin: 16.4 g/dL (ref 13.0–17.0)
Immature Granulocytes: 1 %
Lymphocytes Relative: 23 %
Lymphs Abs: 3.4 10*3/uL (ref 0.7–4.0)
MCH: 29.7 pg (ref 26.0–34.0)
MCHC: 33.7 g/dL (ref 30.0–36.0)
MCV: 88.2 fL (ref 80.0–100.0)
Monocytes Absolute: 1.1 10*3/uL — ABNORMAL HIGH (ref 0.1–1.0)
Monocytes Relative: 8 %
Neutro Abs: 9.6 10*3/uL — ABNORMAL HIGH (ref 1.7–7.7)
Neutrophils Relative %: 64 %
Platelets: 431 10*3/uL — ABNORMAL HIGH (ref 150–400)
RBC: 5.52 MIL/uL (ref 4.22–5.81)
RDW: 12.1 % (ref 11.5–15.5)
WBC: 14.6 10*3/uL — ABNORMAL HIGH (ref 4.0–10.5)
nRBC: 0 % (ref 0.0–0.2)

## 2021-12-12 LAB — COMPREHENSIVE METABOLIC PANEL WITH GFR
ALT: 38 U/L (ref 0–44)
AST: 36 U/L (ref 15–41)
Albumin: 4 g/dL (ref 3.5–5.0)
Alkaline Phosphatase: 107 U/L (ref 38–126)
Anion gap: 9 (ref 5–15)
BUN: 19 mg/dL (ref 6–20)
CO2: 24 mmol/L (ref 22–32)
Calcium: 9.7 mg/dL (ref 8.9–10.3)
Chloride: 107 mmol/L (ref 98–111)
Creatinine, Ser: 1.28 mg/dL — ABNORMAL HIGH (ref 0.61–1.24)
GFR, Estimated: >60 mL/min
Glucose, Bld: 174 mg/dL — ABNORMAL HIGH (ref 70–99)
Potassium: 3.4 mmol/L — ABNORMAL LOW (ref 3.5–5.1)
Sodium: 140 mmol/L (ref 135–145)
Total Bilirubin: 0.6 mg/dL (ref 0.3–1.2)
Total Protein: 7.9 g/dL (ref 6.5–8.1)

## 2021-12-12 LAB — URINALYSIS, ROUTINE W REFLEX MICROSCOPIC
Bilirubin Urine: NEGATIVE
Glucose, UA: NEGATIVE mg/dL
Hgb urine dipstick: NEGATIVE
Ketones, ur: NEGATIVE mg/dL
Leukocytes,Ua: NEGATIVE
Nitrite: NEGATIVE
Protein, ur: NEGATIVE mg/dL
Specific Gravity, Urine: 1.004 — ABNORMAL LOW (ref 1.005–1.030)
pH: 5 (ref 5.0–8.0)

## 2021-12-12 LAB — LIPASE, BLOOD: Lipase: 49 U/L (ref 11–51)

## 2021-12-12 LAB — TROPONIN I (HIGH SENSITIVITY): Troponin I (High Sensitivity): 12 ng/L (ref <18)

## 2021-12-12 NOTE — ED Triage Notes (Signed)
Pt presents via EMS with complaints of left sided CP that started a few days ago. Pt also endorses dizziness when he turns his head swiftly. Pt notes taking Metoprolol PTA but no meds received by EMS. Denies SOB.

## 2021-12-12 NOTE — ED Notes (Signed)
Urine sent to the lab at this time.

## 2022-01-02 ENCOUNTER — Ambulatory Visit: Payer: Medicare Other | Admitting: Internal Medicine

## 2022-01-05 ENCOUNTER — Other Ambulatory Visit: Payer: Self-pay

## 2022-01-05 ENCOUNTER — Emergency Department
Admission: EM | Admit: 2022-01-05 | Discharge: 2022-01-05 | Disposition: A | Discharge status: Home / Self Care | Payer: Medicare Other | Attending: Student in an Organized Health Care Education/Training Program | Admitting: Student in an Organized Health Care Education/Training Program

## 2022-01-05 ENCOUNTER — Emergency Department: Payer: Medicare Other

## 2022-01-05 DIAGNOSIS — R000 Tachycardia, unspecified: Secondary | ICD-10-CM | POA: Diagnosis not present

## 2022-01-05 DIAGNOSIS — R002 Palpitations: Secondary | ICD-10-CM

## 2022-01-05 DIAGNOSIS — I2510 Atherosclerotic heart disease of native coronary artery without angina pectoris: Secondary | ICD-10-CM | POA: Diagnosis not present

## 2022-01-05 LAB — CBC WITH DIFFERENTIAL/PLATELET
Abs Immature Granulocytes: 0.03 10*3/uL (ref 0.00–0.07)
Basophils Absolute: 0.1 10*3/uL (ref 0.0–0.1)
Basophils Relative: 1 %
Eosinophils Absolute: 0.3 10*3/uL (ref 0.0–0.5)
Eosinophils Relative: 3 %
HCT: 45.4 % (ref 39.0–52.0)
Hemoglobin: 14.8 g/dL (ref 13.0–17.0)
Immature Granulocytes: 0 %
Lymphocytes Relative: 35 %
Lymphs Abs: 2.8 10*3/uL (ref 0.7–4.0)
MCH: 29.5 pg (ref 26.0–34.0)
MCHC: 32.6 g/dL (ref 30.0–36.0)
MCV: 90.6 fL (ref 80.0–100.0)
Monocytes Absolute: 0.4 10*3/uL (ref 0.1–1.0)
Monocytes Relative: 5 %
Neutro Abs: 4.6 10*3/uL (ref 1.7–7.7)
Neutrophils Relative %: 56 %
Platelets: 301 10*3/uL (ref 150–400)
RBC: 5.01 MIL/uL (ref 4.22–5.81)
RDW: 12.2 % (ref 11.5–15.5)
WBC: 8.1 10*3/uL (ref 4.0–10.5)
nRBC: 0 % (ref 0.0–0.2)

## 2022-01-05 LAB — COMPREHENSIVE METABOLIC PANEL
ALT: 41 U/L (ref 0–44)
AST: 31 U/L (ref 15–41)
Albumin: 3.9 g/dL (ref 3.5–5.0)
Alkaline Phosphatase: 82 U/L (ref 38–126)
Anion gap: 6 (ref 5–15)
BUN: 21 mg/dL — ABNORMAL HIGH (ref 6–20)
CO2: 26 mmol/L (ref 22–32)
Calcium: 9 mg/dL (ref 8.9–10.3)
Chloride: 107 mmol/L (ref 98–111)
Creatinine, Ser: 1.33 mg/dL — ABNORMAL HIGH (ref 0.61–1.24)
GFR, Estimated: >60 mL/min (ref >60)
Glucose, Bld: 190 mg/dL — ABNORMAL HIGH (ref 70–99)
Potassium: 3.6 mmol/L (ref 3.5–5.1)
Sodium: 139 mmol/L (ref 135–145)
Total Bilirubin: 0.6 mg/dL (ref 0.3–1.2)
Total Protein: 7.7 g/dL (ref 6.5–8.1)

## 2022-01-05 LAB — MAGNESIUM: Magnesium: 2.2 mg/dL (ref 1.7–2.4)

## 2022-01-05 LAB — TROPONIN I (HIGH SENSITIVITY)
Troponin I (High Sensitivity): 9 ng/L (ref <18)
Troponin I (High Sensitivity): 11 ng/L (ref <18)

## 2022-01-05 NOTE — ED Triage Notes (Signed)
Pt to ED via [facility] from home. Pt has unknown source of tachycardia and is followed by cardiologist Dr. Saunders Revel. Pt called due to HR between 160-170s for about 66mns. 2.563mmetoprolol  and 250cc NS given in route. HR on arrival 80s. 18g RAC. Pt denies CP or SOB.   Pt states took his medication as prescribed this morning.

## 2022-01-05 NOTE — Discharge Instructions (Signed)
Keep appointment with cardiology this week.  Return for any additional questions or concerns.

## 2022-01-05 NOTE — ED Provider Notes (Signed)
[facility] Provider Note    Event Date/Time   First MD Initiated Contact with Patient 01/05/22 1108     (approximate)   History   Tachycardia   HPI  Aaron [name] is a 56 y.o. male   history of CAD heart failure paroxysmal tachycardia followed by cardiology presents to the ER for evaluation of tachycardia heart rate in the 160s 170s.  Was given metoprolol as well as bolus of IV fluids with conversion back to normal rate.  States he feels well at this time.  Denies any chest tightness or pressure.  No numbness tingling.  No nausea or vomiting.      Physical Exam   Triage Vital Signs: ED Triage Vitals [01/05/22 1113]  Enc Vitals Group     BP      Pulse      Resp      Temp      Temp src      SpO2      Weight 294 lb (133.4 kg)     Height 5' 9"  (1.753 m)     Head Circumference      Peak Flow      Pain Score 0     Pain Loc      Pain Edu?      Excl. in [place]?     Most recent vital signs: Vitals:   01/05/22 1115 01/05/22 1121  BP:    Pulse: 80   Resp: 12   Temp:  97.9 F (36.6 C)  SpO2: 96%      Constitutional: Alert  Eyes: Conjunctivae are normal.  Head: Atraumatic. Nose: No congestion/rhinnorhea. Mouth/Throat: Mucous membranes are moist.   Neck: Painless ROM.  Cardiovascular:   Good peripheral circulation. Respiratory: Normal respiratory effort.  No retractions.  Gastrointestinal: Soft and nontender.  Musculoskeletal:  no deformity Neurologic:  MAE spontaneously. No gross focal neurologic deficits are appreciated.  Skin:  Skin is warm, dry and intact. No rash noted. Psychiatric: Mood and affect are normal. Speech and behavior are normal.    ED Results / Procedures / Treatments   Labs (all labs ordered are listed, but only abnormal results are displayed) Labs Reviewed  COMPREHENSIVE METABOLIC PANEL - Abnormal; Notable for the following components:      Result Value   Glucose, Bld 190 (*)    BUN 21 (*)    Creatinine, Ser  1.33 (*)    All other components within normal limits  CBC WITH DIFFERENTIAL/PLATELET  MAGNESIUM  TROPONIN I (HIGH SENSITIVITY)  TROPONIN I (HIGH SENSITIVITY)     EKG  ED ECG REPORT I, Merlyn Lot, the attending physician, personally viewed and interpreted this ECG.   Date: 01/05/2022  EKG Time: 11:13  Rate: 80  Rhythm: sinus with prolonged pr  Axis: normal  Intervals: normal qt  ST&T Change: non specific st abn, no stemi    RADIOLOGY Please see ED Course for my review and interpretation.  I personally reviewed all radiographic images ordered to evaluate for the above acute complaints and reviewed radiology reports and findings.  These findings were personally discussed with the patient.  Please see medical record for radiology report.    PROCEDURES:  Critical Care performed: No  Procedures   MEDICATIONS ORDERED IN ED: Medications - No data to display   IMPRESSION / MDM / [place] / ED COURSE  I reviewed the triage vital signs and the nursing notes.  Differential diagnosis includes, but is not limited to, dysrhythmia, anemia, electrolyte abnormality, CHF, ACS, sepsis, pneumonia  Presented to the ER for evaluation of symptoms as described above.  This presenting complaint could reflect a potentially life-threatening illness therefore the patient will be placed on continuous pulse oximetry and telemetry for monitoring.  Laboratory evaluation will be sent to evaluate for the above complaints.     Clinical Course as of 01/05/22 1428  Sat Jan 05, 2022  1425 His repeat troponin is normal.  He remains well-appearing in no acute distress.  He is stable and appropriate for continued work-up as an outpatient [PR]    Clinical Course User Index [PR] Merlyn Lot, MD    FINAL CLINICAL IMPRESSION(S) / ED DIAGNOSES   Final diagnoses:  Palpitations     Rx / DC Orders   ED Discharge Orders     None         Note:  This document was prepared using Dragon voice recognition software and may include unintentional dictation errors.    Merlyn Lot, MD 01/05/22 [phone]

## 2022-01-09 ENCOUNTER — Ambulatory Visit (INDEPENDENT_AMBULATORY_CARE_PROVIDER_SITE_OTHER): Payer: Medicare Other | Admitting: Cardiology

## 2022-01-09 ENCOUNTER — Encounter: Payer: Self-pay | Admitting: Cardiology

## 2022-01-09 ENCOUNTER — Other Ambulatory Visit: Payer: Self-pay | Admitting: Internal Medicine

## 2022-01-09 VITALS — BP 102/64 | HR 61 | Ht 69.0 in | Wt 292.5 lb

## 2022-01-09 DIAGNOSIS — I10 Essential (primary) hypertension: Secondary | ICD-10-CM

## 2022-01-09 DIAGNOSIS — I428 Other cardiomyopathies: Secondary | ICD-10-CM

## 2022-01-09 DIAGNOSIS — I471 Supraventricular tachycardia: Secondary | ICD-10-CM | POA: Diagnosis not present

## 2022-01-09 MED ORDER — LOSARTAN POTASSIUM 50 MG PO TABS: 50.0000 mg | ORAL_TABLET | Freq: Every day | ORAL | 3 refills | Status: DC

## 2022-01-09 MED ORDER — METOPROLOL SUCCINATE ER 50 MG PO TB24: 50.0000 mg | ORAL_TABLET | Freq: Two times a day (BID) | ORAL | 3 refills | Status: DC

## 2022-01-09 NOTE — Patient Instructions (Signed)
Medication Instructions:  Decrease Losartan to 50 mg daily Increase Metoprolol succinate to 50 mg two times a day, okay to take extra 25 mg if hear rate over 100 at rest.  *If you need a refill on your cardiac medications before your next appointment, please call your pharmacy*   Lab Work: none If you have labs (blood work) drawn today and your tests are completely normal, you will receive your results only by: [place] (if you have MyChart) OR A paper copy in the mail If you have any lab test that is abnormal or we need to change your treatment, we will call you to review the results.   Testing/Procedures: none   Follow-Up: At [facility], you and your health needs are our priority.  As part of our continuing mission to provide you with exceptional heart care, we have created designated Provider Care Teams.  These Care Teams include your primary Cardiologist (physician) and Advanced Practice Providers (APPs -  Physician Assistants and Nurse Practitioners) who all work together to provide you with the care you need, when you need it.  We recommend signing up for the patient portal called "MyChart".  Sign up information is provided on this After Visit Summary.  MyChart is used to connect with patients for Virtual Visits (Telemedicine).  Patients are able to view lab/test results, encounter notes, upcoming appointments, etc.  Non-urgent messages can be sent to your provider as well.   To learn more about what you can do with MyChart, go to [email or web address].    Your next appointment:   3 month(s)  The format for your next appointment:   In Person  Provider:   You will see one of the following Advanced Practice Providers on your designated Care Team:   Murray Hodgkins, NP Christell Faith, PA-C Cadence Kathlen Mody, PA-C}    Other Instructions NA  Important Information About [place]

## 2022-01-09 NOTE — Progress Notes (Signed)
Electrophysiology Office Follow up Visit Note:    Date:  01/09/2022   ID:  Aaron [name], DOB [birth year], MRN [id number]  PCP:  [place] Cardiologist:  Nelva Bush, MD  [facility] Electrophysiologist:  Vickie Epley, MD    Interval History:    Aaron [name] is a 56 y.o. male who presents for a follow up visit.  I last saw the patient November 14, 2021 for paroxysmal SVT.  His episodes of SVT include heart rates ago to the 130s and are symptomatic.  He actually presented to the hospital last week for an episode of tachycardia that was symptomatic.  His heart rate went up into the 130s to 150s.  EMS gave him a dose of IV metoprolol which brought it back down to normal.  He has been taking 50 mg twice daily of flecainide and metoprolol 50 mg by mouth once daily.  He is with his wife left previously met.       Past Medical History:  Diagnosis Date   ADD (attention deficit disorder)    Chronic back pain    disc   Coronary artery disease    Crohn's disease ([place])    Heart murmur    HFrEF (heart failure with reduced ejection fraction) ([place])    Hypertension    Kidney stone    MDD (major depressive disorder)    Obesity     Past Surgical History:  Procedure Laterality Date   CARDIAC CATHETERIZATION     COLONOSCOPY N/A 06/14/2021   Procedure: COLONOSCOPY;  Surgeon: Annamaria Helling, DO;  Location: [facility];  Service: Gastroenterology;  Laterality: N/A;   ESOPHAGOGASTRODUODENOSCOPY N/A 06/14/2021   Procedure: ESOPHAGOGASTRODUODENOSCOPY (EGD);  Surgeon: Annamaria Helling, DO;  Location: [facility] ENDOSCOPY;  Service: Gastroenterology;  Laterality: N/A;   KIDNEY STONE SURGERY     LEFT HEART CATH AND CORONARY ANGIOGRAPHY Left 06/27/2020   Procedure: LEFT HEART CATH AND CORONARY ANGIOGRAPHY;  Surgeon: Nelva Bush, MD;  Location: [facility] CV [facility];  Service: Cardiovascular;  Laterality: Left;   VASECTOMY      Current  Medications: Current Meds  Medication Sig   acetaminophen (TYLENOL) 325 MG tablet Take 650-975 mg by mouth every 6 (six) hours as needed for moderate pain or headache (for pain.).   albuterol (VENTOLIN HFA) 108 (90 Base) MCG/ACT inhaler Inhale 2 puffs into the lungs every 6 (six) hours as needed for wheezing or shortness of breath.   cetirizine (ZYRTEC) 10 MG tablet Take 1 tablet (10 mg total) by mouth daily as needed (allergies.).   flecainide (TAMBOCOR) 50 MG tablet Take 1 tablet (50 mg total) by mouth 2 (two) times daily.   fluticasone (FLONASE) 50 MCG/ACT nasal spray Place 2 sprays into both nostrils daily as needed (allergies.).   furosemide (LASIX) 40 MG tablet TAKE 1 TABLET BY MOUTH EVERY DAY   ipratropium (ATROVENT HFA) 17 MCG/ACT inhaler Inhale 2 puffs into the lungs every 6 (six) hours.   losartan (COZAAR) 50 MG tablet Take 1 tablet (50 mg total) by mouth daily.   metoprolol succinate (TOPROL-XL) 50 MG 24 hr tablet Take 1 tablet (50 mg total) by mouth 2 (two) times daily. May take extra 25 mg daily if heart rate is over 100 at rest. Take with or immediately following a meal.   montelukast (SINGULAIR) 10 MG tablet Take 1 tablet (10 mg total) by mouth at bedtime as needed (allergies.).   ondansetron (ZOFRAN) 4 MG tablet Take 4 mg  by mouth every 8 (eight) hours as needed for nausea/vomiting.   oxyCODONE-acetaminophen (PERCOCET/ROXICET) 5-325 MG tablet Take 1 tablet by mouth every 4 (four) hours as needed for severe pain.   pregabalin (LYRICA) 100 MG capsule Take 100 mg by mouth 2 (two) times daily.   spironolactone (ALDACTONE) 50 MG tablet TAKE 1 TABLET BY MOUTH EVERY DAY   Vitamin D, Ergocalciferol, (DRISDOL) 1.25 MG (50000 UNIT) CAPS capsule Take 1 capsule by mouth once a week.   [DISCONTINUED] losartan (COZAAR) 100 MG tablet TAKE 1 TABLET BY MOUTH EVERY DAY   [DISCONTINUED] metoprolol succinate (TOPROL-XL) 50 MG 24 hr tablet TAKE 1 TABLET BY MOUTH DAILY. TAKE WITH OR IMMEDIATELY  FOLLOWING A MEAL.     Allergies:   Patient has no known allergies.   Social History   Socioeconomic History   Marital status: Widowed    Spouse name: carisa   Number of children: 2   Years of education: Not on file   Highest education level: High school graduate  Occupational History   Not on file  Tobacco Use   Smoking status: Never   Smokeless tobacco: Never  Vaping Use   Vaping Use: Never used  Substance and Sexual Activity   Alcohol use: Not Currently   Drug use: Never   Sexual activity: Not Currently  Other Topics Concern   Not on file  Social History Narrative   Not on file   Social Determinants of Health   Financial Resource Strain: Low Risk  (09/02/2018)   Overall Financial Resource Strain (CARDIA)    Difficulty of Paying Living Expenses: Not hard at all  Food Insecurity: No Food Insecurity (09/02/2018)   Hunger Vital Sign    Worried About Running Out of Food in the Last Year: Never true    [place] in the Last Year: Never true  Transportation Needs: No Transportation Needs (09/02/2018)   PRAPARE - Hydrologist (Medical): No    Lack of Transportation (Non-Medical): No  Physical Activity: Insufficiently Active (09/02/2018)   Exercise Vital Sign    Days of Exercise per Week: 2 days    Minutes of Exercise per Session: 30 min  Stress: Stress Concern Present (09/02/2018)   [place]    Feeling of Stress : Very much  Social Connections: Unknown (09/02/2018)   Social Connection and Isolation Panel [NHANES]    Frequency of Communication with Friends and Family: Not on file    Frequency of Social Gatherings with Friends and Family: Not on file    Attends Religious Services: Never    Marine scientist or Organizations: No    Attends Music therapist: Never    Marital Status: Married     Family History: The patient's family history includes COPD in his  father and mother; Cancer in his mother; Congestive Heart Failure in his father; Diabetes in his paternal grandfather; Healthy in his daughter and son; Heart attack in his paternal grandfather; Heart attack (age of onset: 30) in his father; Hypertension in his father and sister; Lung cancer in his father.  ROS:   Please see the history of present illness.    All other systems reviewed and are negative.  EKGs/Labs/Other Studies Reviewed:    The following studies were reviewed today:  ER records  EKG:  The ekg ordered today demonstrates sinus rhythm with a first-degree AV delay.  QRS duration 82 ms.  Recent Labs:  11/10/2021: B Natriuretic Peptide 122.5; TSH 0.427 01/05/2022: ALT 41; BUN 21; Creatinine, Ser 1.33; Hemoglobin 14.8; Magnesium 2.2; Platelets 301; Potassium 3.6; Sodium 139  Recent Lipid Panel    Component Value Date/Time   CHOL 96 05/19/2020 0500   CHOL 117 11/17/2019 1003   TRIG 70 05/19/2020 0500   HDL 27 (L) 05/19/2020 0500   HDL 31 (L) 11/17/2019 1003   CHOLHDL 3.6 05/19/2020 0500   VLDL 14 05/19/2020 0500   LDLCALC 55 05/19/2020 0500   LDLCALC 64 11/17/2019 1003    Physical Exam:    VS:  BP 102/64 (BP Location: Left Arm, Patient Position: Sitting, Cuff Size: Large)   Pulse 61   Ht 5' 9"  (1.753 m)   Wt 292 lb 8 oz (132.7 kg)   SpO2 97%   BMI 43.19 kg/m     Wt Readings from Last 3 Encounters:  01/09/22 292 lb 8 oz (132.7 kg)  01/05/22 294 lb (133.4 kg)  12/12/21 285 lb (129.3 kg)     GEN:  Well nourished, well developed in no acute distress.  Obese HEENT: Normal NECK: No JVD; No carotid bruits LYMPHATICS: No lymphadenopathy CARDIAC: RRR, no murmurs, rubs, gallops RESPIRATORY:  Clear to auscultation without rales, wheezing or rhonchi  ABDOMEN: Soft, non-tender, non-distended MUSCULOSKELETAL:  No edema; No deformity  SKIN: Warm and dry NEUROLOGIC:  Alert and oriented x 3 PSYCHIATRIC:  Normal affect        ASSESSMENT:    1. PSVT (paroxysmal  supraventricular tachycardia) ([place])   2. Nonischemic cardiomyopathy ([place])   3. Essential hypertension    PLAN:    In order of problems listed above:  #PSVT Recurrent.  I would like to avoid use of amiodarone.  He is not a good candidate for invasive EP procedures given his body habitus.  I will have him decrease his dose of losartan to 50 mg by mouth once daily and increase his Toprol-XL dose to 50 mg by mouth twice daily and see if this has any impact on his burden of symptoms.  If he has an episode of SVT I have instructed him to take an additional dose of metoprolol succinate 25 mg.  If the symptoms persist for after 30 minutes he can take another 25 mg dose of metoprolol succinate.  If recurrent SVT, will need to consider initiation of amiodarone.  #Obesity Discussed weight loss during today's appointment.  If he can get his weight down, we could reconsider invasive EP procedures to manage this arrhythmia without medications.  #Hypertension At goal.  Continue monitoring blood pressures as we decrease losartan's dose.   Follow-up 3 months.   Total time spent with patient today 45 minutes. This includes reviewing records, evaluating the patient and coordinating care.   Medication Adjustments/Labs and Tests Ordered: Current medicines are reviewed at length with the patient today.  Concerns regarding medicines are outlined above.  Orders Placed This Encounter  Procedures   EKG 12-Lead   Meds ordered this encounter  Medications   losartan (COZAAR) 50 MG tablet    Sig: Take 1 tablet (50 mg total) by mouth daily.    Dispense:  90 tablet    Refill:  3   metoprolol succinate (TOPROL-XL) 50 MG 24 hr tablet    Sig: Take 1 tablet (50 mg total) by mouth 2 (two) times daily. May take extra 25 mg daily if heart rate is over 100 at rest. Take with or immediately following a meal.    Dispense:  90  tablet    Refill:  3     Signed, Lars Mage, MD, [facility], [facility] 01/09/2022 4:58 PM     Electrophysiology [facility]

## 2022-01-14 ENCOUNTER — Telehealth: Payer: Self-pay | Admitting: Internal Medicine

## 2022-01-14 NOTE — Telephone Encounter (Signed)
The patient is calling in complaining of low heart rate with added tiredness. His dose of metoprolol succinate was increased and Losartan was decreased on 8/9 OV. He took 50 mg BID on 8/10 and 8/11 then 8/12 he took 50 mg AM and then that evening his heart rate was in the low 40's he held his evening dose. On 8/13 he took 25 mg BID with heart rates running 45-50 all that day. This morning 8/14 he took 25 mg. Current VS: 132/94 58. Patient states he will take 25 mg again tonight not the 50 mg but would like to know how Dr. Quentin Ore would like him to do going forward with his Metoprolol and Losartan.  Will forward to MD for advisement.

## 2022-01-14 NOTE — Telephone Encounter (Signed)
New Message:     Patient said he took Zenwise for digested purposes.Saturday morning his heart rate started dropping.   STAT if HR is under 50 or over 120 (normal HR is 60-100 beats per minute)  What is your heart rate? During the day it is around, at night around 38 or 39  Do you have a log of your heart rate readings (document readings)?   Do you have any other symptoms? always tired

## 2022-01-14 NOTE — Telephone Encounter (Signed)
His metoprolol dose was just increased 5 days ago, recommend forwarding to managing team as this is more likely the culprit of his bradycardia rather than his probiotic.

## 2022-01-15 ENCOUNTER — Ambulatory Visit: Payer: Medicare Other | Attending: Neurology

## 2022-01-15 DIAGNOSIS — G4733 Obstructive sleep apnea (adult) (pediatric): Secondary | ICD-10-CM | POA: Insufficient documentation

## 2022-01-22 ENCOUNTER — Telehealth: Payer: Self-pay | Admitting: Cardiology

## 2022-01-22 IMAGING — CT CT ABD-PELV W/ CM
2 of 5 series · 16 of 46 positions shown, 18 images · IV contrast (omnipaque)
Comparison: CT abdomen pelvis dated 03/16/2021.

CLINICAL DATA: Abdominal pain.

EXAM:
CT ABDOMEN AND PELVIS WITH CONTRAST
TECHNIQUE: Multidetector CT imaging of the abdomen and pelvis was performed
using the standard protocol following bolus administration of
intravenous contrast.
CONTRAST:  100mL OMNIPAQUE IOHEXOL 350 MG/ML SOLN

[Series 2: abd pelvis 5.00 · axial · 0.92mm/px · z∈[-1538,-1098]mm · 13 of 100 slices shown, 15 images]
[im 6/100  soft-tissue]
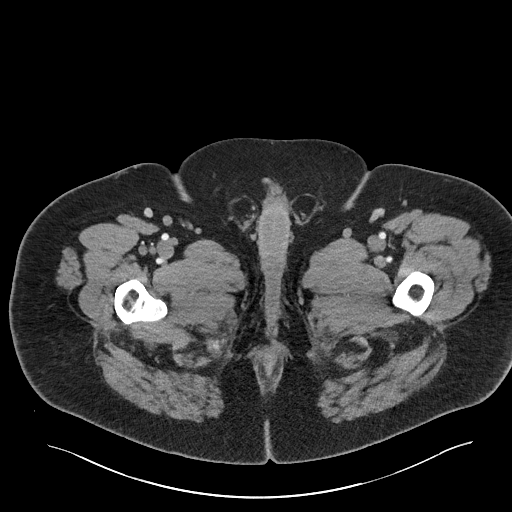
[im 6/100  bone]
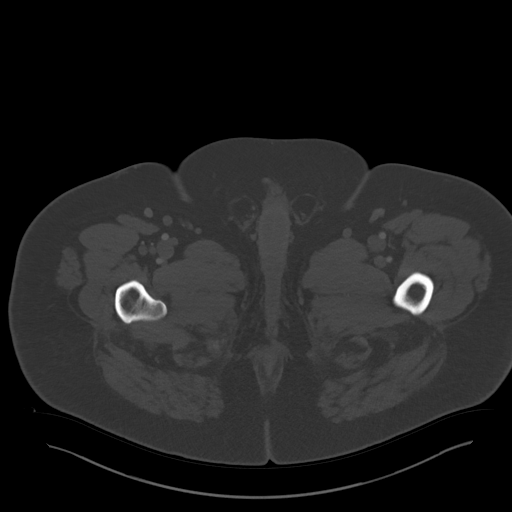
[im 12/100  soft-tissue]
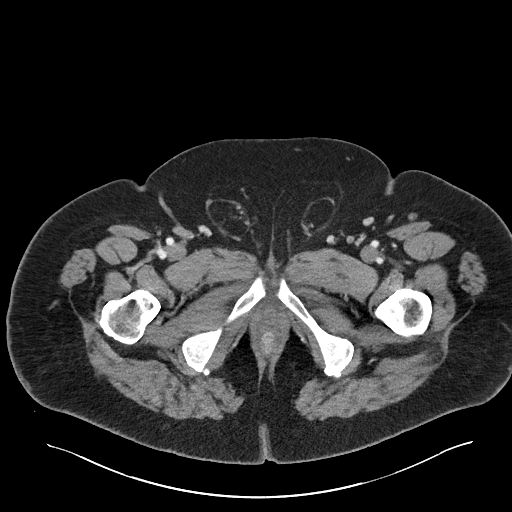
[im 23/100  soft-tissue]
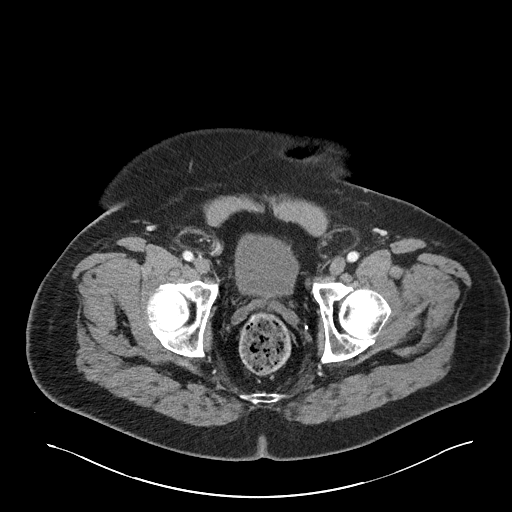
[im 28/100  soft-tissue]
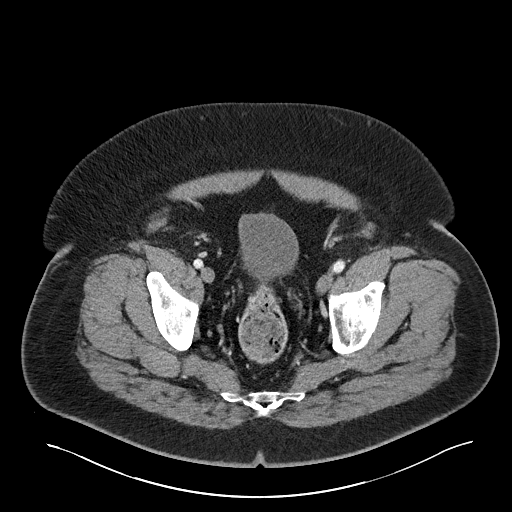
[im 34/100  soft-tissue]
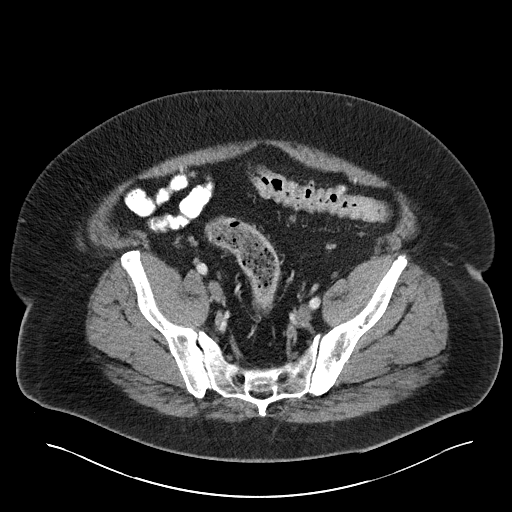
[im 45/100  soft-tissue]
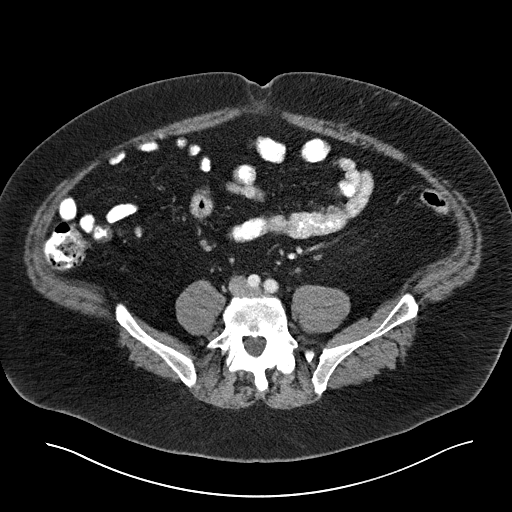
[im 50/100  soft-tissue]
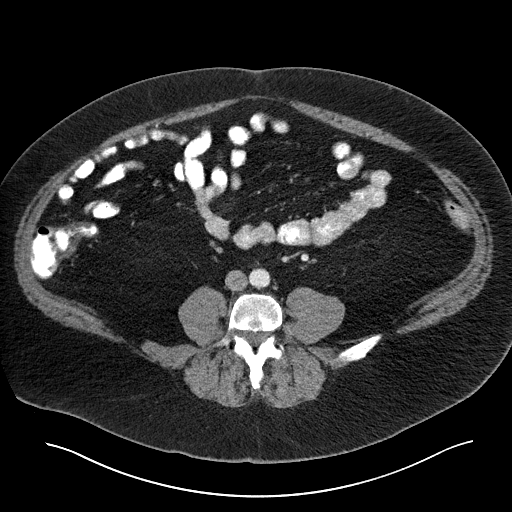
[im 56/100  soft-tissue]
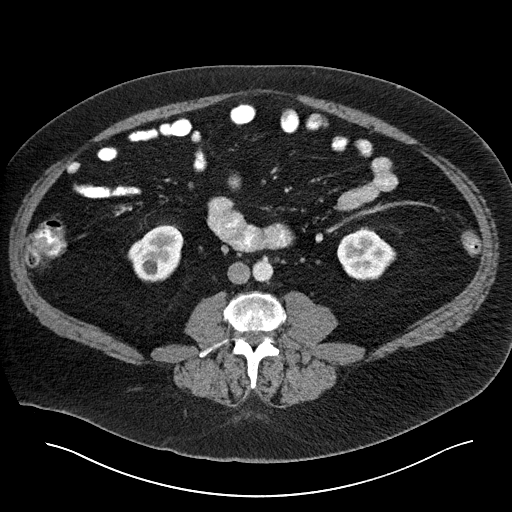
[im 67/100  soft-tissue]
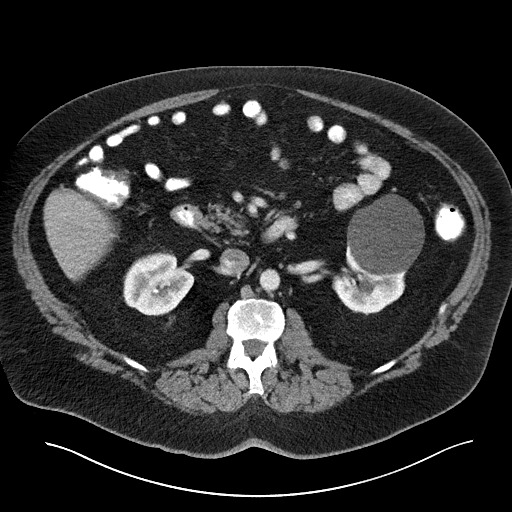
[im 67/100  bone]
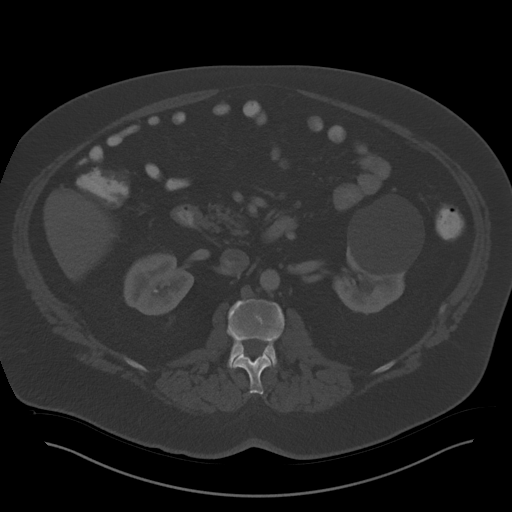
[im 72/100  soft-tissue]
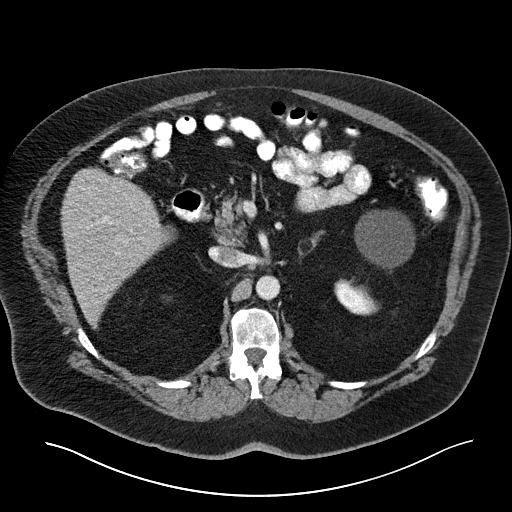
[im 78/100  soft-tissue]
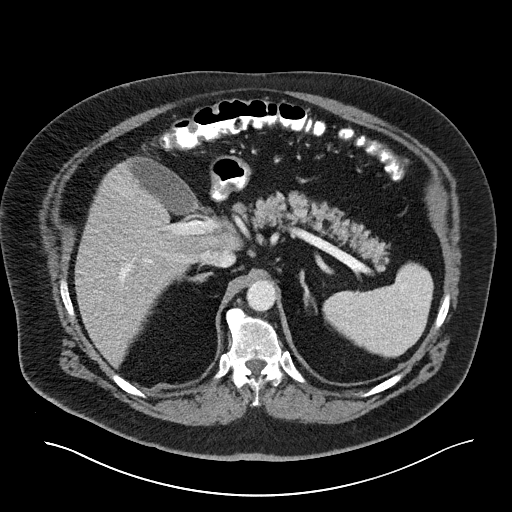
[im 89/100  soft-tissue]
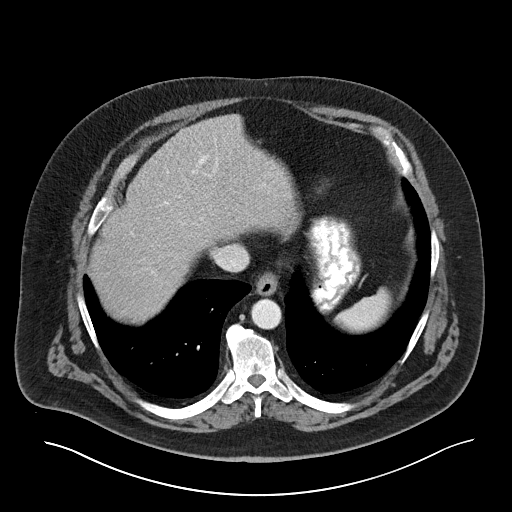
[im 94/100  soft-tissue]
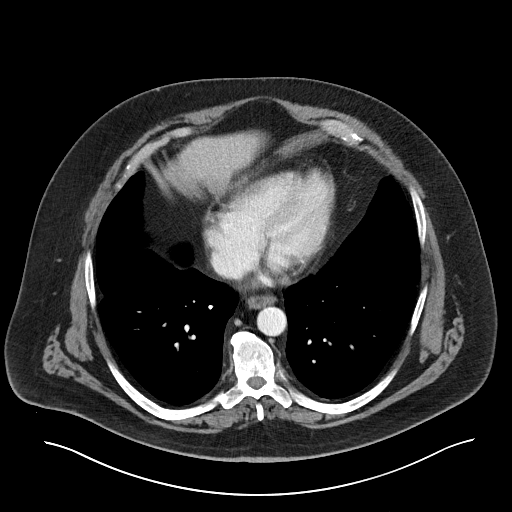

[Series 4: coronals abd pelvis 2.00 cor · coronal · 0.92mm/px · 3 of 185 slices shown]
[im 62/185  soft-tissue]
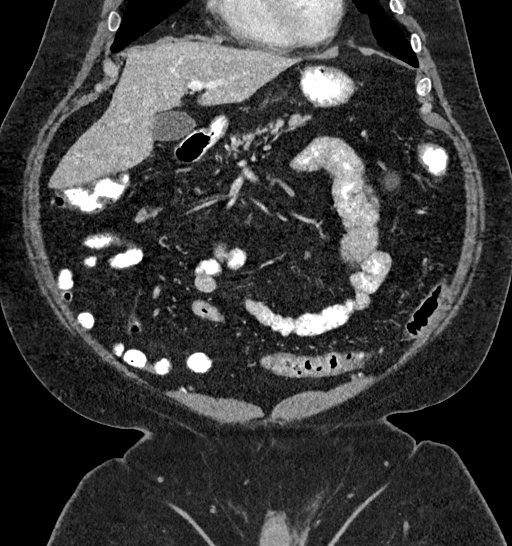
[im 82/185  soft-tissue]
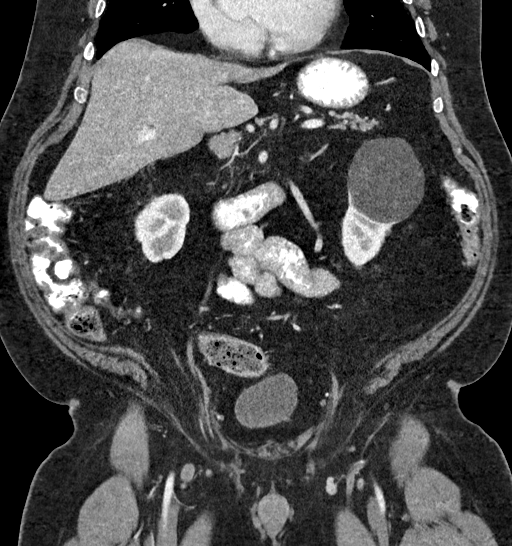
[im 103/185  soft-tissue]
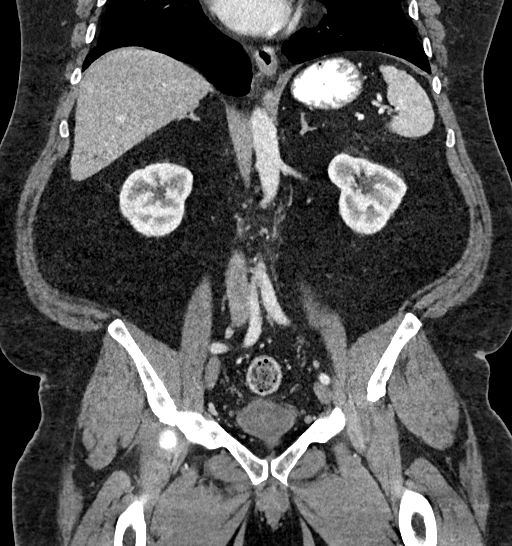

[16 of 46 positions shown; findings below may reference images not displayed]

FINDINGS: Lower chest: The visualized lung bases are clear.

No intra-abdominal free air or free fluid.

Hepatobiliary: No focal liver abnormality is seen. No gallstones,
gallbladder wall thickening, or biliary dilatation.

Pancreas: Unremarkable. No pancreatic ductal dilatation or
surrounding inflammatory changes.

Spleen: Normal in size without focal abnormality.

Adrenals/Urinary Tract: There is a 14 mm left adrenal adenoma. The
right adrenal gland is unremarkable. There is a 7.5 cm cyst in the
left kidney. There is a 3 mm nonobstructive right renal upper pole
stone. There is no hydronephrosis on either side. There is symmetric
enhancement and excretion of contrast by both kidneys. A small right
renal inferior pole hypodense lesion is too small to characterize
but likely a cyst. The visualized ureters and urinary bladder appear
unremarkable.

Stomach/Bowel: There is sigmoid diverticulosis without active
inflammatory changes. There is no bowel obstruction or active
inflammation. The appendix is normal.

Vascular/Lymphatic: The abdominal aorta and IVC are unremarkable. No
portal venous gas. There is no adenopathy.

Reproductive: The prostate and seminal vesicles are grossly
unremarkable. No pelvic mass.

Other: Small fat containing bilateral inguinal hernias.

Musculoskeletal: Mild degenerative changes of the spine. No acute
osseous pathology.
IMPRESSION: 1. No acute intra-abdominal or pelvic pathology.
2. Sigmoid diverticulosis. No bowel obstruction. Normal appendix.
3. A 3 mm nonobstructive right renal upper pole stone. No
hydronephrosis.

## 2022-01-22 NOTE — Telephone Encounter (Signed)
Patient called the answering service this evening complaining that his HR was elevated to around 135-140. He denies having any chest pain, palpitations, dizziness, sob, dizziness, near syncope. He has a history of SVT and took his HR about 30 minutes after eating dinner. His HR is always a little elevated after eating (usually around 90s-100s) but tonight it was in the 140s. Patient has a history of paroxysmal SVT, and is suppose to be taking metoprolol succinate 50 mg BID and flecainide 50 mg daily. However, patient has not been taking any of his metoprolol for the past few days because he is worried that his HR gets too slow when he is asleep.   Tonight, patient did take 3/4 tablet of metoprolol. This was about 30 minutes ago, HR did improve but went up again.  Per chart review, Dr. Quentin Ore saw the patient on 8/9. Per his note, if patient had recurrent SVT, he was to take an additional dose of metoprolol 25 mg. If elevated HR persisted for 30 minutes, he could take another 25 mg dose of metoprolol succinate.   As patient has only had 37.5 mg metoprolol succinate today, I instructed him to take another 25 mg now.   Patient called me a half hour after taking his metoprolol, HR had improved to the 90s.   I encouraged him to take metoprolol succinate 25 mg tomorrow AM to help suppress rapid HR, he voiced understanding.   Margie Billet, PA-C 01/22/2022 7:26 PM

## 2022-01-24 ENCOUNTER — Other Ambulatory Visit: Payer: Self-pay | Admitting: Nurse Practitioner

## 2022-01-24 ENCOUNTER — Other Ambulatory Visit (HOSPITAL_COMMUNITY): Payer: Self-pay | Admitting: Nurse Practitioner

## 2022-01-24 DIAGNOSIS — M4727 Other spondylosis with radiculopathy, lumbosacral region: Secondary | ICD-10-CM

## 2022-01-28 ENCOUNTER — Ambulatory Visit: Payer: Medicare Other | Admitting: Adult Health

## 2022-02-01 ENCOUNTER — Ambulatory Visit: Payer: Medicare Other

## 2022-02-06 ENCOUNTER — Encounter: Payer: Self-pay | Admitting: Nurse Practitioner

## 2022-02-06 ENCOUNTER — Ambulatory Visit: Payer: Medicare Other | Attending: Medical | Admitting: Nurse Practitioner

## 2022-02-06 VITALS — BP 100/70 | HR 67 | Ht 69.0 in | Wt 285.0 lb

## 2022-02-06 DIAGNOSIS — I428 Other cardiomyopathies: Secondary | ICD-10-CM | POA: Diagnosis not present

## 2022-02-06 DIAGNOSIS — I5022 Chronic systolic (congestive) heart failure: Secondary | ICD-10-CM

## 2022-02-06 DIAGNOSIS — I10 Essential (primary) hypertension: Secondary | ICD-10-CM

## 2022-02-06 DIAGNOSIS — I471 Supraventricular tachycardia: Secondary | ICD-10-CM

## 2022-02-06 DIAGNOSIS — I440 Atrioventricular block, first degree: Secondary | ICD-10-CM

## 2022-02-06 MED ORDER — METOPROLOL TARTRATE 25 MG PO TABS
ORAL_TABLET | ORAL | 3 refills | Status: DC
Start: 2022-02-06 — End: 2022-09-16

## 2022-02-06 NOTE — Patient Instructions (Signed)
Medication Instructions:  - Your physician has recommended you make the following change in your medication:   1) START Metoprolol Tartrate 25 mg: - take 1 tablet (25 mg) by mouth once daily as needed for tachycardia   *If you need a refill on your cardiac medications before your next appointment, please call your pharmacy*   Lab Work: - none ordered  If you have labs (blood work) drawn today and your tests are completely normal, you will receive your results only by: [place] (if you have MyChart) OR A paper copy in the mail If you have any lab test that is abnormal or we need to change your treatment, we will call you to review the results.   Testing/Procedures: - none ordered   Follow-Up: At [facility], you and your health needs are our priority.  As part of our continuing mission to provide you with exceptional heart care, we have created designated Provider Care Teams.  These Care Teams include your primary Cardiologist (physician) and Advanced Practice Providers (APPs -  Physician Assistants and Nurse Practitioners) who all work together to provide you with the care you need, when you need it.  We recommend signing up for the patient portal called "MyChart".  Sign up information is provided on this After Visit Summary.  MyChart is used to connect with patients for Virtual Visits (Telemedicine).  Patients are able to view lab/test results, encounter notes, upcoming appointments, etc.  Non-urgent messages can be sent to your provider as well.   To learn more about what you can do with MyChart, go to [email or web address].    Your next appointment:   Wednesday 02/20/22 @ 3:20 pm   The format for your next appointment:   In Person  Provider:   Lars Mage, MD    Other Instructions N/a  Important Information About Sugar

## 2022-02-06 NOTE — Progress Notes (Signed)
Office Visit    Patient Name: Aaron [name] Date of Encounter: 02/06/2022  Primary Care Provider:  [facility] Primary Cardiologist:  Nelva Bush, MD  Chief Complaint    56 year old male with a history of nonobstructive CAD, nonischemic cardiomyopathy/chronic heart failure with improved ejection fraction, hypertension, nephrolithiasis, morbid obesity, obstructive sleep apnea, and PSVT, who presents for follow-up related to tachypalpitations.  Past Medical History    Past Medical History:  Diagnosis Date   ADD (attention deficit disorder)    Chronic back pain    disc   Chronic HFimpEF (heart failure with improved ejection fraction) ([place])    a. 06/2020 LV gram: EF 35-45%; b. 11/2021 Echo: EF 55%, no rwma, GrI DD, nl RV fxn, mild MR. ao root 38m.   [place]    Heart murmur    Hypertension    Kidney stone    MDD (major depressive disorder)    NICM ([place]ischemic cardiomyopathy) ([place]    a. 06/2020 LV gram: EF 35-45%; b. 11/2021 Echo: EF 55%, GrI DD.   Nonobstructive CAD (coronary artery disease)    a. 10/2011 Stress MV: Equivocal. EF 68%, no ischemia; b. 06/2020 Cath: LM nl, LAD 34mLCX large, mild diff dzs, RCA mild diff dzs. EF 35-45%; c. 11/2021 ETT: Ex time 3:48. No acute ST/T changes. Max HR 105.   Obesity    OSA (obstructive sleep apnea)    Premature atrial contractions    a 06/2019 Zio: Predom RSR. Freq PACs (13% burden).   [facility]   Past Surgical History:  Procedure Laterality Date   CARDIAC CATHETERIZATION     COLONOSCOPY N/A 06/14/2021   Procedure: COLONOSCOPY;  Surgeon: RuAnnamaria HellingDO;  [facility];  Service: Gastroenterology;  Laterality: N/A;   ESOPHAGOGASTRODUODENOSCOPY N/A 06/14/2021   Procedure: ESOPHAGOGASTRODUODENOSCOPY (EGD);  Surgeon: RuAnnamaria HellingDO;  [facility];  Service: Gastroenterology;  Laterality: N/A;   KIDNEY STONE SURGERY      LEFT HEART CATH AND CORONARY ANGIOGRAPHY Left 06/27/2020   Procedure: LEFT HEART CATH AND CORONARY ANGIOGRAPHY;  Surgeon: EnNelva BushMD;  [facility];  Service: Cardiovascular;  Laterality: Left;   VASECTOMY      Allergies  No Known Allergies  History of Present Illness    [age]ear old male with above complex past medical history including nonobstructive CAD, nonischemic cardiomyopathy/chronic heart failure with improved ejection fraction, hypertension, nephrolithiasis, morbid obesity, obstructive sleep apnea, Crohn's disease, and PSVT.  He was previously evaluated in July 2021 in the setting of chest pain and palpitations.  He subsequently underwent stress testing in August 2021 which suggested a mild inferolateral reversible defect.  EF was calculated at 35%.  He also underwent event monitoring October 2021 which showed a 13% PAC burden.  In the setting of ongoing dyspnea and abnormal EKG, decision was made to pursue diagnostic catheterization.  This was performed in January 2022 showing mild, nonobstructive disease with an EF of 35 to 45% by ventriculography.  He was medically managed with beta-blocker, statin, and long-acting nitrate.  He was later placed on ARB therapy.  He has been dealing with 2 separate types of palpitations, one typically occurring after meals and described as an increase in heart rate into the 80s and 90s prior to returning to a baseline level of bradycardia (50s) after a few hours.  The other is more abrupt onset with tachypalpitations typically in the 130s to 140s though he is at  least one episode into the 170s.  In April 2023, he had recurrent abrupt onset palpitations associated with lightheadedness and was seen in the emergency department.  ECG was suspicious for atrial flutter with 2-1 AV block and he was treated with intravenous metoprolol with eventual conversion to sinus rhythm.  He had a second episode in late April.  An echocardiogram was  performed in June 2023 showing improvement in EF to 55%.  He was subsequently placed on flecainide therapy and seen by electrophysiology.  Follow-up exercise treadmill test showed poor exercise tolerance (3 minutes 48 seconds) without exercise-induced arrhythmias or widening of the QRS.  He has had recurrent episodes of PSVT at least twice over the past month, the first prompting him to present to the emergency department, where he was treated with IV metoprolol and improvement in heart rate.  On August 22, he contacted our on-call provider due to a similar episode and was able to take additional metoprolol at home with eventual relief of symptoms.  Since then, he has had no recurrence of SVT, though he continues to note some elevation in heart rate after meals.  Due to resting bradycardia, he is only able to tolerate Toprol-XL 25 mg twice daily.  Otherwise heart rates will trend in the 40s.  He typically experiences some dyspnea during episodes of SVT but does not experience chest pain, presyncope, or syncope.  He has not been using his CPAP as his device was recalled however, he recently underwent repeat sleep study w/ recommendation for in lab study and titration, which is currently pending.  Home Medications    Current Outpatient Medications  Medication Sig Dispense Refill   acetaminophen (TYLENOL) 325 MG tablet Take 650-975 mg by mouth every 6 (six) hours as needed for moderate pain or headache (for pain.).     albuterol (VENTOLIN HFA) 108 (90 Base) MCG/ACT inhaler Inhale 2 puffs into the lungs every 6 (six) hours as needed for wheezing or shortness of breath. 18 g 5   cetirizine (ZYRTEC) 10 MG tablet Take 1 tablet (10 mg total) by mouth daily as needed (allergies.).     flecainide (TAMBOCOR) 50 MG tablet Take 1 tablet (50 mg total) by mouth 2 (two) times daily. 180 tablet 0   fluticasone (FLONASE) 50 MCG/ACT nasal spray Place 2 sprays into both nostrils daily as needed (allergies.).     furosemide  (LASIX) 40 MG tablet Take 1 tablet (40 mg total) by mouth daily. Please keep appointment on 02-06-22 for refills 30 tablet 0   ipratropium (ATROVENT HFA) 17 MCG/ACT inhaler Inhale 2 puffs into the lungs every 6 (six) hours.     losartan (COZAAR) 50 MG tablet Take 1 tablet (50 mg total) by mouth daily. 90 tablet 3   metoprolol succinate (TOPROL-XL) 50 MG 24 hr tablet Take 1 tablet (50 mg total) by mouth 2 (two) times daily. May take extra 25 mg daily if heart rate is over 100 at rest. Take with or immediately following a meal. (Patient taking differently: Take 25 mg by mouth 2 (two) times daily. May take extra 25 mg daily if heart rate is over 100 at rest. Take with or immediately following a meal.) 90 tablet 3   metoprolol tartrate (LOPRESSOR) 25 MG tablet Take 1 tablet (25 mg) by mouth once daily as needed for tachycardia 30 tablet 3   montelukast (SINGULAIR) 10 MG tablet Take 1 tablet (10 mg total) by mouth at bedtime as needed (allergies.).     ondansetron (ZOFRAN)  4 MG tablet Take 4 mg by mouth every 8 (eight) hours as needed for nausea/vomiting.     oxyCODONE-acetaminophen (PERCOCET/ROXICET) 5-325 MG tablet Take 1 tablet by mouth every 4 (four) hours as needed for severe pain. 20 tablet 0   pregabalin (LYRICA) 100 MG capsule Take 100 mg by mouth 2 (two) times daily.     Semaglutide,0.25 or 0.5MG/DOS, (OZEMPIC, 0.25 OR 0.5 MG/DOSE,) 2 MG/1.5ML SOPN Inject into the skin once a week.     spironolactone (ALDACTONE) 50 MG tablet TAKE 1 TABLET BY MOUTH EVERY DAY 90 tablet 0   Vitamin D, Ergocalciferol, (DRISDOL) 1.25 MG (50000 UNIT) CAPS capsule Take 1 capsule by mouth once a week.     No current facility-administered medications for this visit.     Review of Systems    Ongoing intermittent tachypalpitations as outlined above.  This is often associated with mild dyspnea.  He denies chest pain, PND, orthopnea, dizziness, syncope, edema, or early satiety..  All other systems reviewed and are otherwise  negative except as noted above.    Physical Exam    VS:  BP 100/70 (BP Location: Left Arm, Patient Position: Sitting, Cuff Size: Normal)   Pulse 67   Ht 5' 9"  (1.753 m)   Wt 285 lb (129.3 kg)   BMI 42.09 kg/m  , BMI Body mass index is 42.09 kg/m.     GEN: Obese, in no acute distress. HEENT: normal. Neck: Supple, obese, difficult to gauge JVP.  No bruits or masses.   Cardiac: RRR, no murmurs, rubs, or gallops. No clubbing, cyanosis, edema.  Radials/DP/PT 2+ and equal bilaterally.  Respiratory:  Respirations regular and unlabored, clear to auscultation bilaterally. GI: Soft, nontender, nondistended, BS + x 4. MS: no deformity or atrophy. Skin: warm and dry, no rash. Neuro:  Strength and sensation are intact. Psych: Normal affect.  Accessory Clinical Findings    ECG personally reviewed by me today -regular sinus rhythm, 67, first-degree AV block, lateral T wave abnormalities- no acute changes.  Lab Results  Component Value Date   WBC 8.1 01/05/2022   HGB 14.8 01/05/2022   HCT 45.4 01/05/2022   MCV 90.6 01/05/2022   PLT 301 01/05/2022   Lab Results  Component Value Date   CREATININE 1.33 (H) 01/05/2022   BUN 21 (H) 01/05/2022   NA 139 01/05/2022   K 3.6 01/05/2022   CL 107 01/05/2022   CO2 26 01/05/2022   Lab Results  Component Value Date   ALT 41 01/05/2022   AST 31 01/05/2022   ALKPHOS 82 01/05/2022   BILITOT 0.6 01/05/2022   Lab Results  Component Value Date   CHOL 96 05/19/2020   HDL 27 (L) 05/19/2020   LDLCALC 55 05/19/2020   TRIG 70 05/19/2020   CHOLHDL 3.6 05/19/2020    Lab Results  Component Value Date   HGBA1C 5.7 (H) 11/10/2021    Assessment & Plan    1.  PSVT: Patient with a history of symptomatic PACs as well as PSVT.  He has been treated with flecainide 50 mg twice daily as well as metoprolol therapy.  In the setting of baseline bradycardia, he is only able to tolerate metoprolol XL 25 mg twice daily.  Any more than that, and resting heart  rates dip into the 40s.  Over the past month, has had 2 episodes of recurrent SVT with abrupt onset and rates in the 130s to 140s.  On both occasions, this occurred when he was getting up off  the couch.  Most recently, he was able to abort symptoms after taking additional Toprol-XL and he has been stable over the past 2 weeks or so.  He is frustrated with ongoing recurrences and wishes to avoid changing from flecainide to amiodarone if at all possible.  We discussed options for management today I also discussed this case with Dr. Quentin Ore.  We will continue his current dose of Toprol-XL at 25 mg twice daily as well as flecainide 50 mg twice daily.  I am providing him a prescription for metoprolol to tartrate 25 mg to be used as needed for breakthrough tachycardia.  He will also follow-up with Dr. Quentin Ore in approximately 2 weeks to reconsider ablative options.  2.  Nonischemic cardiomyopathy/chronic heart failure with improved ejection fraction: EF previously 35 to 45% by ventriculography in 2022.  Diagnostic catheterization at that time showed mild, nonobstructive CAD.  More recent echo in June of this year showed an EF of 55% with grade 1 diastolic dysfunction and mild MR.  He is euvolemic on examination and remains on beta-blocker, ARB, and diuretic therapy.  3.  Essential hypertension: Blood pressure is soft on beta-blocker and ARB therapy.  4.  Obstructive sleep apnea: Patient with a history of sleep apnea however, his CPAP machine has been recalled and he is not currently using anything.  He recently underwent repeat testing with recommendation for follow-up study in PAP titration.  He plans to follow-up with his primary care provider related to this to arrange.  5.  First-degree AV block: Stable at 300 ms on current dose of beta-blocker.  6.  Disposition: Follow-up in EP clinic in 2 weeks.   Murray Hodgkins, NP 02/06/2022, 5:41 PM

## 2022-02-11 LAB — EXERCISE TOLERANCE TEST
Angina Index: 0
Base ST Depression (mm): 0 mm
Estimated workload: 5.4
Exercise duration (min): 3 min
Exercise duration (sec): 48 s
MPHR: 165 {beats}/min
Peak HR: 105 {beats}/min
Percent HR: 63 %
RPE: 13
Rest HR: 67 {beats}/min

## 2022-02-15 ENCOUNTER — Ambulatory Visit
Admission: RE | Admit: 2022-02-15 | Discharge: 2022-02-15 | Disposition: A | Discharge status: Home / Self Care | Payer: Medicare Other | Source: Ambulatory Visit | Attending: Nurse Practitioner | Admitting: Nurse Practitioner

## 2022-02-15 DIAGNOSIS — M4727 Other spondylosis with radiculopathy, lumbosacral region: Secondary | ICD-10-CM

## 2022-02-19 NOTE — Progress Notes (Unsigned)
Electrophysiology Office Follow up Visit Note:    Date:  02/20/2022   ID:  Aaron [name], DOB November 30, 1965, MRN [id number]  PCP:  [place] Cardiologist:  Nelva Bush, MD  [facility] Electrophysiologist:  Vickie Epley, MD    Interval History:    Aaron [name] is a 56 y.o. male who presents for a follow up visit. They were last seen in clinic 01/09/2022 by me and in follow up by Aaron [name] on 02/06/2022.   At the appointment with Aaron [name] he reported continued episodes of symptomatic tachycardia with rates into the 130s. He has not tolerated higher doses of beta blockers.   He is here with his wife left previously met.  He tells me that he is done much better since starting the flecainide.  He has not had another sustained episodes of the very rapid SVT.  He is overall pleased with how he is doing.    Past Medical History:  Diagnosis Date   ADD (attention deficit disorder)    Chronic back pain    disc   Chronic HFimpEF (heart failure with improved ejection fraction) ([place])    a. 06/2020 LV gram: EF 35-45%; b. 11/2021 Echo: EF 55%, no rwma, GrI DD, nl RV fxn, mild MR. ao root 73m.   Crohn's disease ([place]    Heart murmur    Hypertension    Kidney stone    MDD (major depressive disorder)    NIC[place] ([place]ischemic cardiomyopathy) [place]    a. 06/2020 LV gram: EF 35-45%; b. 11/2021 Echo: EF 55%, GrI DD.   Nonobstructive CAD (coronary artery disease)    a. 10/2011 Stress MV: Equivocal. EF 68%, no ischemia; b. 06/2020 Cath: LM nl, LAD 322mLCX large, mild diff dzs, RCA mild diff dzs. EF 35-45%; c. 11/2021 ETT: Ex time 3:48. No acute ST/T changes. Max HR 105.   Obesity    OSA (obstructive sleep apnea)    Premature atrial contractions    a 06/2019 Zio: Predom RSR. Freq PACs (13% burden).   [facility]    Past Surgical History:  Procedure Laterality Date   CARDIAC CATHETERIZATION     COLONOSCOPY N/A 06/14/2021    Procedure: COLONOSCOPY;  Surgeon: RuAnnamaria HellingDO;  [facility];  Service: Gastroenterology;  Laterality: N/A;   ESOPHAGOGASTRODUODENOSCOPY N/A 06/14/2021   Procedure: ESOPHAGOGASTRODUODENOSCOPY (EGD);  Surgeon: RuAnnamaria HellingDO;  [facility];  Service: Gastroenterology;  Laterality: N/A;   KIDNEY STONE SURGERY     LEFT HEART CATH AND CORONARY ANGIOGRAPHY Left 06/27/2020   Procedure: LEFT HEART CATH AND CORONARY ANGIOGRAPHY;  Surgeon: EnNelva BushMD;  [facility];  Service: Cardiovascular;  Laterality: Left;   VASECTOMY      Current Medications: Current Meds  Medication Sig   acetaminophen (TYLENOL) 325 MG tablet Take 650-975 mg by mouth every 6 (six) hours as needed for moderate pain or headache (for pain.).   albuterol (VENTOLIN HFA) 108 (90 Base) MCG/ACT inhaler Inhale 2 puffs into the lungs every 6 (six) hours as needed for wheezing or shortness of breath.   cetirizine (ZYRTEC) 10 MG tablet Take 1 tablet (10 mg total) by mouth daily as needed (allergies.).   flecainide (TAMBOCOR) 50 MG tablet Take 1 tablet (50 mg total) by mouth 2 (two) times daily.   fluticasone (FLONASE) 50 MCG/ACT nasal spray Place 2 sprays into both nostrils daily as needed (allergies.).   furosemide (LASIX) 40  MG tablet Take 1 tablet (40 mg total) by mouth daily.   ipratropium (ATROVENT HFA) 17 MCG/ACT inhaler Inhale 2 puffs into the lungs every 6 (six) hours.   losartan (COZAAR) 50 MG tablet Take 1 tablet (50 mg total) by mouth daily.   metoprolol succinate (TOPROL-XL) 50 MG 24 hr tablet Take 1 tablet (50 mg total) by mouth 2 (two) times daily. May take extra 25 mg daily if heart rate is over 100 at rest. Take with or immediately following a meal. (Patient taking differently: Take 25 mg by mouth 2 (two) times daily. May take extra 25 mg daily if heart rate is over 100 at rest. Take with or immediately following a meal.)   metoprolol tartrate  (LOPRESSOR) 25 MG tablet Take 1 tablet (25 mg) by mouth once daily as needed for tachycardia   montelukast (SINGULAIR) 10 MG tablet Take 1 tablet (10 mg total) by mouth at bedtime as needed (allergies.).   ondansetron (ZOFRAN) 4 MG tablet Take 4 mg by mouth every 8 (eight) hours as needed for nausea/vomiting.   oxyCODONE-acetaminophen (PERCOCET/ROXICET) 5-325 MG tablet Take 1 tablet by mouth every 4 (four) hours as needed for severe pain.   pregabalin (LYRICA) 100 MG capsule Take 100 mg by mouth 2 (two) times daily.   Semaglutide,0.25 or 0.5MG/DOS, (OZEMPIC, 0.25 OR 0.5 MG/DOSE,) 2 MG/1.5ML SOPN Inject into the skin once a week.   spironolactone (ALDACTONE) 50 MG tablet TAKE 1 TABLET BY MOUTH EVERY DAY   Vitamin D, Ergocalciferol, (DRISDOL) 1.25 MG (50000 UNIT) CAPS capsule Take 1 capsule by mouth once a week.     Allergies:   Patient has no known allergies.   Social History   Socioeconomic History   Marital status: Widowed    Spouse name: carisa   Number of children: 2   Years of education: Not on file   Highest education level: High school graduate  Occupational History   Not on file  Tobacco Use   Smoking status: Never   Smokeless tobacco: Never  Vaping Use   Vaping Use: Never used  Substance and Sexual Activity   Alcohol use: Not Currently   Drug use: Never   Sexual activity: Not Currently  Other Topics Concern   Not on file  Social History Narrative   Not on file   Social Determinants of Health   Financial Resource Strain: Low Risk  (09/02/2018)   Overall Financial Resource Strain (CARDIA)    Difficulty of Paying Living Expenses: Not hard at all  Food Insecurity: No Food Insecurity (09/02/2018)   Hunger Vital Sign    Worried About Running Out of Food in the Last Year: Never true    [place] in the Last Year: Never true  Transportation Needs: No Transportation Needs (09/02/2018)   PRAPARE - Hydrologist (Medical): No    Lack of  Transportation (Non-Medical): No  Physical Activity: Insufficiently Active (09/02/2018)   Exercise Vital Sign    Days of Exercise per Week: 2 days    Minutes of Exercise per Session: 30 min  Stress: Stress Concern Present (09/02/2018)   [place]    Feeling of Stress : Very much  Social Connections: Unknown (09/02/2018)   Social Connection and Isolation Panel [NHANES]    Frequency of Communication with Friends and Family: Not on file    Frequency of Social Gatherings with Friends and Family: Not on file  Attends Religious Services: Never    Active Member of Clubs or Organizations: No    Attends Music therapist: Never    Marital Status: Married     Family History: The patient's family history includes COPD in his father and mother; Cancer in his mother; Congestive Heart Failure in his father; Diabetes in his paternal grandfather; Healthy in his daughter and son; Heart attack in his paternal grandfather; Heart attack (age of onset: 68) in his father; Hypertension in his father and sister; Lung cancer in his father.  ROS:   Please see the history of present illness.    All other systems reviewed and are negative.  EKGs/Labs/Other Studies Reviewed:    The following studies were reviewed today:   EKG:  The ekg ordered today demonstrates sinus rhythm with a long first-degree AV delay of 312 ms.  He has a leftward axis.  Recent Labs: 11/10/2021: B Natriuretic Peptide 122.5; TSH 0.427 01/05/2022: ALT 41; BUN 21; Creatinine, Ser 1.33; Hemoglobin 14.8; Magnesium 2.2; Platelets 301; Potassium 3.6; Sodium 139  Recent Lipid Panel    Component Value Date/Time   CHOL 96 05/19/2020 0500   CHOL 117 11/17/2019 1003   TRIG 70 05/19/2020 0500   HDL 27 (L) 05/19/2020 0500   HDL 31 (L) 11/17/2019 1003   CHOLHDL 3.6 05/19/2020 0500   VLDL 14 05/19/2020 0500   LDLCALC 55 05/19/2020 0500   LDLCALC 64 11/17/2019 1003     Physical Exam:    VS:  BP 108/70   Pulse 65   Ht 5' 10"  (1.778 m)   Wt 290 lb (131.5 kg)   BMI 41.61 kg/m     Wt Readings from Last 3 Encounters:  02/20/22 290 lb (131.5 kg)  02/06/22 285 lb (129.3 kg)  01/09/22 292 lb 8 oz (132.7 kg)     GEN:  Well nourished, well developed in no acute distress.  Obese HEENT: Normal NECK: No JVD; No carotid bruits LYMPHATICS: No lymphadenopathy CARDIAC: RRR, no murmurs, rubs, gallops RESPIRATORY:  Clear to auscultation without rales, wheezing or rhonchi  ABDOMEN: Soft, non-tender, non-distended MUSCULOSKELETAL:  No edema; No deformity  SKIN: Warm and dry NEUROLOGIC:  Alert and oriented x 3 PSYCHIATRIC:  Normal affect        ASSESSMENT:    1. PSVT (paroxysmal supraventricular tachycardia) ([place])   2. Chronic HFrEF (heart failure with reduced ejection fraction) ([place])   3. Nonischemic cardiomyopathy ([place])   4. Morbid obesity ([place])    PLAN:    In order of problems listed above:  #PSVT Recurrent, symptomatic despite treatment with BB and antiarrhythmics.  He has done overall better on the flecainide.  He is now prescribed as needed short acting metoprolol for breakthrough symptoms.  I had a very long discussion with the patient and his wife during today's visit about his options.  I think her options for antiarrhythmic drug therapy are limited because of the long first-degree AV delay.  I am hesitant to further increase his flecainide because of this first-degree AV delay.  EP study and ablation is also a difficult choice.  He has an increased periprocedural risk because of his weight and the need for general anesthesia.  There is also a real chance that we will be unable to induce tachycardia given the need for general anesthesia.  His conduction system disease also complicates the prospect of a successful EP study and ablation.  If the EP study induced AVNRT, slow pathway modification could result in complete heart  block given the  prolonged first-degree AV delay and its suggestion of poor fast pathway conduction.  For now, would favor continuing current dose of flecainide and metoprolol succinate with as needed metoprolol tartrate.  If he were to develop symptomatic breakthrough, I have asked him to take a dose of the metoprolol tartrate and wait at least 1 hour for symptom resolution.  #Obesity Weight loss encouraged during today's appointment.  Total time spent with patient today 45 minutes. This includes reviewing records, evaluating the patient and coordinating care.   Medication Adjustments/Labs and Tests Ordered: Current medicines are reviewed at length with the patient today.  Concerns regarding medicines are outlined above.  Orders Placed This Encounter  Procedures   EKG 12-Lead   No orders of the defined types were placed in this encounter.    Signed, Lars Mage, MD, [facility], [facility] 02/20/2022 5:36 PM    Electrophysiology [facility]

## 2022-02-20 ENCOUNTER — Other Ambulatory Visit: Payer: Self-pay | Admitting: Internal Medicine

## 2022-02-20 ENCOUNTER — Ambulatory Visit: Payer: Medicare Other | Attending: Cardiology | Admitting: Cardiology

## 2022-02-20 ENCOUNTER — Encounter: Payer: Self-pay | Admitting: Cardiology

## 2022-02-20 VITALS — BP 108/70 | HR 65 | Ht 70.0 in | Wt 290.0 lb

## 2022-02-20 DIAGNOSIS — I428 Other cardiomyopathies: Secondary | ICD-10-CM | POA: Diagnosis not present

## 2022-02-20 DIAGNOSIS — I471 Supraventricular tachycardia: Secondary | ICD-10-CM | POA: Diagnosis not present

## 2022-02-20 DIAGNOSIS — I5022 Chronic systolic (congestive) heart failure: Secondary | ICD-10-CM

## 2022-02-20 NOTE — Patient Instructions (Signed)
Medication Instructions:  none *If you need a refill on your cardiac medications before your next appointment, please call your pharmacy*   Lab Work: None  If you have labs (blood work) drawn today and your tests are completely normal, you will receive your results only by: [place] (if you have MyChart) OR A paper copy in the mail If you have any lab test that is abnormal or we need to change your treatment, we will call you to review the results.   Testing/Procedures: none   Follow-Up: At [facility], you and your health needs are our priority.  As part of our continuing mission to provide you with exceptional heart care, we have created designated Provider Care Teams.  These Care Teams include your primary Cardiologist (physician) and Advanced Practice Providers (APPs -  Physician Assistants and Nurse Practitioners) who all work together to provide you with the care you need, when you need it.  We recommend signing up for the patient portal called "MyChart".  Sign up information is provided on this After Visit Summary.  MyChart is used to connect with patients for Virtual Visits (Telemedicine).  Patients are able to view lab/test results, encounter notes, upcoming appointments, etc.  Non-urgent messages can be sent to your provider as well.   To learn more about what you can do with MyChart, go to [email or web address].    Your next appointment:   3 month(s)  The format for your next appointment:   In Person  Provider:   Lars Mage, MD    Other Instructions None   Important Information About Sugar

## 2022-03-01 ENCOUNTER — Other Ambulatory Visit: Payer: Self-pay | Admitting: Cardiology

## 2022-03-01 ENCOUNTER — Other Ambulatory Visit: Payer: Self-pay | Admitting: Internal Medicine

## 2022-03-01 NOTE — Telephone Encounter (Signed)
Good Afternoon,  Please advise if it is ok to send prescription as is or change the sig? Patient states that he breaks the tablets in half and is taking 25 mg twice a day. Thank you so much.

## 2022-03-06 NOTE — Telephone Encounter (Signed)
Refill sent to pharmacy.   

## 2022-04-04 ENCOUNTER — Ambulatory Visit: Payer: Medicare Other | Admitting: Adult Health

## 2022-04-11 ENCOUNTER — Encounter: Payer: Self-pay | Admitting: Nurse Practitioner

## 2022-04-11 ENCOUNTER — Ambulatory Visit: Payer: Medicare Other | Attending: Nurse Practitioner | Admitting: Nurse Practitioner

## 2022-04-11 VITALS — BP 118/82 | HR 70 | Ht 68.0 in | Wt 293.4 lb

## 2022-04-11 DIAGNOSIS — I471 Supraventricular tachycardia, unspecified: Secondary | ICD-10-CM | POA: Diagnosis not present

## 2022-04-11 DIAGNOSIS — I428 Other cardiomyopathies: Secondary | ICD-10-CM

## 2022-04-11 DIAGNOSIS — G4733 Obstructive sleep apnea (adult) (pediatric): Secondary | ICD-10-CM

## 2022-04-11 DIAGNOSIS — I5022 Chronic systolic (congestive) heart failure: Secondary | ICD-10-CM | POA: Diagnosis not present

## 2022-04-11 DIAGNOSIS — I440 Atrioventricular block, first degree: Secondary | ICD-10-CM

## 2022-04-11 NOTE — Patient Instructions (Signed)
Medication Instructions:   Your physician recommends that you continue on your current medications as directed. Please refer to the Current Medication list given to you today.  *If you need a refill on your cardiac medications before your next appointment, please call your pharmacy*   Lab Work: [place]   If you have labs (blood work) drawn today and your tests are completely normal, you will receive your results only by: [place] (if you have MyChart) OR A paper copy in the mail If you have any lab test that is abnormal or we need to change your treatment, we will call you to review the results.   Testing/Procedures: NONE ORDERED  TODAY    Follow-Up: At [facility], you and your health needs are our priority.  As part of our continuing mission to provide you with exceptional heart care, we have created designated Provider Care Teams.  These Care Teams include your primary Cardiologist (physician) and Advanced Practice Providers (APPs -  Physician Assistants and Nurse Practitioners) who all work together to provide you with the care you need, when you need it.  We recommend signing up for the patient portal called "MyChart".  Sign up information is provided on this After Visit Summary.  MyChart is used to connect with patients for Virtual Visits (Telemedicine).  Patients are able to view lab/test results, encounter notes, upcoming appointments, etc.  Non-urgent messages can be sent to your provider as well.   To learn more about what you can do with MyChart, go to [email or web address].    Your next appointment:   5 week(s)  The format for your next appointment:   In Person  Provider:   Lars Mage, MD      Important Information About Sugar

## 2022-04-11 NOTE — Progress Notes (Signed)
Office Visit    Patient Name: Aaron [name] Date of Encounter: 04/11/2022  Primary Care Provider:  [facility] Primary Cardiologist:  Aaron Bush, MD/EP: Grayce Sessions, MD  Chief Complaint    56 year old male with history of nonobstructive CAD, nonischemic cardiomyopathy/chronic heart failure with improved ejection fraction, hypertension, nephrolithiasis, morbid obesity, obstructive sleep apnea, and PSVT, who presents for follow-up related to PSVT.  Past Medical History    Past Medical History:  Diagnosis Date   ADD (attention deficit disorder)    Chronic back pain    disc   Chronic HFimpEF (heart failure with improved ejection fraction) ([place])    a. 06/2020 LV gram: EF 35-45%; b. 11/2021 Echo: EF 55%, no rwma, GrI DD, nl RV fxn, mild MR. ao root 41m.   Crohn's disease ([place]    Heart murmur    Hypertension    Kidney stone    MDD (major depressive disorder)    NICM (nonischemic cardiomyopathy) ([place]    a. 06/2020 LV gram: EF 35-45%; b. 11/2021 Echo: EF 55%, GrI DD.   Nonobstructive CAD (coronary artery disease)    a. 10/2011 Stress MV: Equivocal. EF 68%, no ischemia; b. 06/2020 Cath: LM nl, LAD 362mLCX large, mild diff dzs, RCA mild diff dzs. EF 35-45%; c. 11/2021 ETT: Ex time 3:48. No acute ST/T changes. Max HR 105.   Obesity    OSA (obstructive sleep apnea)    Premature atrial contractions    a 06/2019 Zio: Predom RSR. Freq PACs (13% burden).   PSVT (paroxysmal supraventricular tachycardia)    Past Surgical History:  Procedure Laterality Date   CARDIAC CATHETERIZATION     COLONOSCOPY N/A 06/14/2021   Procedure: COLONOSCOPY;  Surgeon: RuAnnamaria HellingDO;  [facility];  Service: Gastroenterology;  Laterality: N/A;   ESOPHAGOGASTRODUODENOSCOPY N/A 06/14/2021   Procedure: ESOPHAGOGASTRODUODENOSCOPY (EGD);  Surgeon: RuAnnamaria HellingDO;  [facility];  Service: Gastroenterology;  Laterality: N/A;   KIDNEY STONE SURGERY      LEFT HEART CATH AND CORONARY ANGIOGRAPHY Left 06/27/2020   Procedure: LEFT HEART CATH AND CORONARY ANGIOGRAPHY;  Surgeon: EnNelva BushMD;  [facility];  Service: Cardiovascular;  Laterality: Left;   VASECTOMY      Allergies  No Known Allergies  History of Present Illness    [age]ear old male with above complex past medical history including nonobstructive CAD, nonischemic cardiomyopathy/chronic heart failure with improved ejection fraction, hypertension, nephrolithiasis, morbid obesity, obstructive sleep apnea, Crohn's disease, and PSVT.  He was previously evaluated in July 2021 in the setting of chest pain and palpitations.  He subsequently underwent stress testing in August 2021, which suggested a mild inferolateral reversible defect.  EF was 35%.  He also underwent event monitoring in October 2021, which showed a 13% PAC burden.  In the setting of ongoing dyspnea and abnormal EKG, decision was made to pursue diagnostic catheterization.  This was performed in January 2022, showing mild, nonobstructive disease with an EF of 35 to 45% by ventriculography.  He was medically managed with beta-blocker, statin, and long-acting nitrate therapy.  He was later placed on ARB therapy.  He has been dealing with 2 separate types of palpitations, typically occurring after meals and described as an increase in heart rate into the 80s and 90s prior to returning to a baseline level of bradycardia (50s) after a few hours.  The other is more abrupt onset with tachypalpitations typically in the 130s to 140s and at least 1  episode of as high as 170s.  April 2023, he had recurrent abrupt onset palpitations associated lightheadedness was seen in the emergency department.  ECG was suspicious for atrial flutter with 2 1 AV block and he was treated with intravenous metoprolol with eventual conversion to sinus rhythm.  He had a second episode in late April 2023.  Echocardiogram in June 2023 showed  improved EF to 35%.  He was subsequently placed on flecainide therapy and seen by electrophysiology.  Follow-up exercise treadmill test showed poor exercise tolerance (3 minutes 48 seconds) without exercise-induced arrhythmias or widening of the QRS.  I saw [name] in clinic in early September, at which time he reported 2 episodes of recurrent PSVT, 1 prompting ED evaluation and requiring IV metoprolol prior to improvement in heart rate.  He was maintained on flecainide 50 mg twice daily, Toprol-XL 25 mg daily (dose limited by baseline bradycardia), evidence provided short acting metoprolol for breakthrough symptoms.  He followed up with electrophysiology on September 20.  He had been doing better at that time decision was made to continue current regimen as ablative options are not straightforward with increased periprocedural risk related to his weight, need for general anesthesia, potential for inability to induce SVT, and elevated risk for complete heart block with ablation in the setting of baseline first-degree AV block.  Since his last visit, he has continued to do well.  In that setting, he has reduced his flecainide to half a tablet twice daily (25 mg twice daily), and is also taking Toprol-XL 25 mg twice daily.  On this regimen, he notes that he has more energy, less bradycardia, and fortunately, has not had any recurrent tachypalpitations.  He denies chest pain, dyspnea, PND, orthopnea, dizziness, syncope, edema, or early satiety.  Home Medications    Current Outpatient Medications  Medication Sig Dispense Refill   acetaminophen (TYLENOL) 325 MG tablet Take 650-975 mg by mouth every 6 (six) hours as needed for moderate pain or headache (for pain.).     albuterol (VENTOLIN HFA) 108 (90 Base) MCG/ACT inhaler Inhale 2 puffs into the lungs every 6 (six) hours as needed for wheezing or shortness of breath. 18 g 5   cetirizine (ZYRTEC) 10 MG tablet Take 1 tablet (10 mg total) by mouth daily as  needed (allergies.).     flecainide (TAMBOCOR) 50 MG tablet Take 1 tablet (50 mg total) by mouth 2 (two) times daily. 180 tablet 0   fluticasone (FLONASE) 50 MCG/ACT nasal spray Place 2 sprays into both nostrils daily as needed (allergies.).     furosemide (LASIX) 40 MG tablet TAKE 1 TABLET BY MOUTH EVERY DAY 90 tablet 3   ipratropium (ATROVENT HFA) 17 MCG/ACT inhaler Inhale 2 puffs into the lungs every 6 (six) hours.     losartan (COZAAR) 50 MG tablet Take 1 tablet (50 mg total) by mouth daily. 90 tablet 3   metoprolol succinate (TOPROL-XL) 50 MG 24 hr tablet Take 0.5 tablet ( 25 mg) twice a day May take extra 25 mg daily if heart rate is over 100 at rest. Take with or immediately following a meal. 180 tablet 1   metoprolol tartrate (LOPRESSOR) 25 MG tablet Take 1 tablet (25 mg) by mouth once daily as needed for tachycardia 30 tablet 3   montelukast (SINGULAIR) 10 MG tablet Take 1 tablet (10 mg total) by mouth at bedtime as needed (allergies.).     ondansetron (ZOFRAN) 4 MG tablet Take 4 mg by mouth every 8 (eight) hours  as needed for nausea/vomiting.     oxyCODONE-acetaminophen (PERCOCET/ROXICET) 5-325 MG tablet Take 1 tablet by mouth every 4 (four) hours as needed for severe pain. 20 tablet 0   pregabalin (LYRICA) 100 MG capsule Take 100 mg by mouth 2 (two) times daily.     Semaglutide,0.25 or 0.5MG/DOS, (OZEMPIC, 0.25 OR 0.5 MG/DOSE,) 2 MG/1.5ML SOPN Inject into the skin once a week.     spironolactone (ALDACTONE) 50 MG tablet TAKE 1 TABLET BY MOUTH EVERY DAY 90 tablet 0   Vitamin D, Ergocalciferol, (DRISDOL) 1.25 MG (50000 UNIT) CAPS capsule Take 1 capsule by mouth once a week.     No current facility-administered medications for this visit.     Review of Systems    He has been feeling well with more energy.  He denies chest pain, palpitations, dyspnea, pnd, orthopnea, n, v, dizziness, syncope, edema, weight gain, or early satiety.  All other systems reviewed and are otherwise negative  except as noted above.    Physical Exam    VS:  BP 102/82 (BP Location: Left Arm, Patient Position: Sitting, Cuff Size: Large)   Pulse 70   Ht 5' 8"  (1.727 m)   Wt 293 lb 6.4 oz (133.1 kg)   SpO2 98%   BMI 44.61 kg/m  , BMI Body mass index is 44.61 kg/m.     Vitals:   04/11/22 1402 04/11/22 1443  BP: 102/82 118/82  Pulse: 70   SpO2: 98%     GEN: Obese, in no acute distress. HEENT: normal. Neck: Supple, obese, difficult to gauge JVP.  No bruits or masses.   Cardiac: RRR, no murmurs, rubs, or gallops. No clubbing, cyanosis, edema.  Radials/PT 2+ and equal bilaterally.  Respiratory:  Respirations regular and unlabored, clear to auscultation bilaterally. GI: Obese, soft, nontender, nondistended, BS + x 4. MS: no deformity or atrophy. Skin: warm and dry, no rash. Neuro:  Strength and sensation are intact. Psych: Normal affect.  Accessory Clinical Findings    ECG personally reviewed by me today -regular sinus rhythm, first-degree AV block, 70, nonspecific T changes.  QRS 90 ms - no acute changes.  Lab Results  Component Value Date   WBC 8.1 01/05/2022   HGB 14.8 01/05/2022   HCT 45.4 01/05/2022   MCV 90.6 01/05/2022   PLT 301 01/05/2022   Lab Results  Component Value Date   CREATININE 1.33 (H) 01/05/2022   BUN 21 (H) 01/05/2022   NA 139 01/05/2022   K 3.6 01/05/2022   CL 107 01/05/2022   CO2 26 01/05/2022   Lab Results  Component Value Date   ALT 41 01/05/2022   AST 31 01/05/2022   ALKPHOS 82 01/05/2022   BILITOT 0.6 01/05/2022   Lab Results  Component Value Date   CHOL 96 05/19/2020   HDL 27 (L) 05/19/2020   LDLCALC 55 05/19/2020   TRIG 70 05/19/2020   CHOLHDL 3.6 05/19/2020    Lab Results  Component Value Date   HGBA1C 5.7 (H) 11/10/2021    Assessment & Plan    1.  PSVT: Patient with history of symptomatic PACs as well as PSVT for which she has been treated with flecainide and metoprolol.  Has been evaluated by Dr. Quentin Ore at felt to be at  elevated risk for complications related to ablation in the setting of first-degree AV block at baseline bradycardia, as well as morbid obesity which would necessitate general anesthesia.  In that setting, she maintained on flecainide and metoprolol XL.  He  also instructed metoprolol to be used as needed for breakthrough palpitations.  Fortunately, he has not had any recurrent palpitations since his visit in September.  He reports that he has reduced his flecainide dose to half a tablet (25 mg) twice daily and has continued taking Toprol-XL 25 mg twice daily.  On this regimen, he notes improved energy and less bradycardia and he prefers to continue this regimen for the time being.  2.  Nonischemic cardiomyopathy/chronic heart failure with improved ejection fraction: EF previously 35 to 45% by ventriculography 2022.  Diagnostic catheterization at that time showed mild, nonobstructive CAD.  Recent echo in June 2023, showed EF of 55% with grade 1 dysfunction and mild MR.  He is euvolemic on examination remains on beta-blocker, ARB, and diuretic therapy.  3.  Essential hypertension: Blood pressure stable on beta-blocker and ARB therapy.  4.  Obstructive sleep apnea: He is still pending a follow-up study for CPAP titration.  5.  First-degree AV block: Stable at 260 ms on flecainide 25 mg twice daily and Toprol-XL 25 mg twice daily.  6.  Disposition: He has of electrophysiology follow-up plan for December 20.   Murray Hodgkins, NP 04/11/2022, 2:10 PM

## 2022-04-12 ENCOUNTER — Other Ambulatory Visit: Payer: Self-pay | Admitting: Cardiology

## 2022-04-12 ENCOUNTER — Other Ambulatory Visit: Payer: Self-pay | Admitting: Nurse Practitioner

## 2022-05-21 ENCOUNTER — Ambulatory Visit: Payer: Medicare Other | Admitting: Adult Health

## 2022-05-22 ENCOUNTER — Encounter: Payer: Self-pay | Admitting: Cardiology

## 2022-05-22 ENCOUNTER — Ambulatory Visit: Payer: Medicare Other | Attending: Cardiology | Admitting: Cardiology

## 2022-05-22 VITALS — BP 114/62 | HR 64 | Ht 70.0 in | Wt 300.0 lb

## 2022-05-22 DIAGNOSIS — I471 Supraventricular tachycardia, unspecified: Secondary | ICD-10-CM | POA: Diagnosis not present

## 2022-05-22 DIAGNOSIS — G4733 Obstructive sleep apnea (adult) (pediatric): Secondary | ICD-10-CM

## 2022-05-22 DIAGNOSIS — I440 Atrioventricular block, first degree: Secondary | ICD-10-CM

## 2022-05-22 MED ORDER — FLECAINIDE ACETATE 50 MG PO TABS: 25.0000 mg | ORAL_TABLET | Freq: Two times a day (BID) | ORAL | 3 refills | Status: DC

## 2022-05-22 NOTE — Progress Notes (Signed)
Electrophysiology Office Follow up Visit Note:    Date:  05/22/2022   ID:  Aaron [name], DOB 1965-06-23, MRN [id number]  PCP:  [place] Cardiologist:  Nelva Bush, MD  [facility] Electrophysiologist:  Vickie Epley, MD    Interval History:    Aaron [name] is a 56 y.o. male who presents for a follow up visit.  I last saw him February 20, 2022.  Aaron [name] saw him in follow-up April 11, 2022.  He has a history of SVT and has been on flecainide.  Further up titration of flecainide has been limited because of his first-degree AV delay.  He is on metoprolol as well.  At the appointment with Aaron [name], he reported doing better on 1/2 tablet of flecainide (69m) twice daily in addition to his metoprolol succinate 25 mg by mouth twice daily.  He had improved energy levels on this regimen.  Today, he reports doing well. Has continued with 268mflecainide BID and metop xl 2557mID. He has not had to take the PRN lopressor in many weeks. Denies CP, palpitations, syncope or presyncope.       Past Medical History:  Diagnosis Date   ADD (attention deficit disorder)    Chronic back pain    disc   Chronic HFimpEF (heart failure with improved ejection fraction) ([place]  a. 06/2020 LV gram: EF 35-45%; b. 11/2021 Echo: EF 55%, no rwma, GrI DD, nl RV fxn, mild MR. ao root 18m51m Crohn's disease (HCC)[place] Heart murmur    Hypertension    Kidney stone    MDD (major depressive disorder)    NICM (nonischemic cardiomyopathy) (HCC)[place] a. 06/2020 LV gram: EF 35-45%; b. 11/2021 Echo: EF 55%, GrI DD.   Nonobstructive CAD (coronary artery disease)    a. 10/2011 Stress MV: Equivocal. EF 68%, no ischemia; b. 06/2020 Cath: LM nl, LAD 1m,40m large, mild diff dzs, RCA mild diff dzs. EF 35-45%; c. 11/2021 ETT: Ex time 3:48. No acute ST/T changes. Max HR 105.   Obesity    OSA (obstructive sleep apnea)    Premature atrial contractions    a 06/2019 Zio: Predom  RSR. Freq PACs (13% burden).   PSVT (paroxysmal supraventricular tachycardia)     Past Surgical History:  Procedure Laterality Date   CARDIAC CATHETERIZATION     COLONOSCOPY N/A 06/14/2021   Procedure: COLONOSCOPY;  Surgeon: RussoAnnamaria Helling  [facility];  Service: Gastroenterology;  Laterality: N/A;   ESOPHAGOGASTRODUODENOSCOPY N/A 06/14/2021   Procedure: ESOPHAGOGASTRODUODENOSCOPY (EGD);  Surgeon: RussoAnnamaria Helling  [facility];  Service: Gastroenterology;  Laterality: N/A;   KIDNEY STONE SURGERY     LEFT HEART CATH AND CORONARY ANGIOGRAPHY Left 06/27/2020   Procedure: LEFT HEART CATH AND CORONARY ANGIOGRAPHY;  Surgeon: End, Nelva Bush  [facility];  Service: Cardiovascular;  Laterality: Left;   VASECTOMY      Current Medications: Current Meds  Medication Sig   acetaminophen (TYLENOL) 325 MG tablet Take 650-975 mg by mouth every 6 (six) hours as needed for moderate pain or headache (for pain.).   albuterol (VENTOLIN HFA) 108 (90 Base) MCG/ACT inhaler Inhale 2 puffs into the lungs every 6 (six) hours as needed for wheezing or shortness of breath.   cetirizine (ZYRTEC) 10 MG tablet Take 1 tablet (10 mg total) by mouth daily as needed (allergies.).   fluticasone (FLONASE) 50 MCG/ACT nasal  spray Place 2 sprays into both nostrils daily as needed (allergies.).   furosemide (LASIX) 40 MG tablet Take 20 mg by mouth daily.   ipratropium (ATROVENT HFA) 17 MCG/ACT inhaler Inhale 2 puffs into the lungs every 6 (six) hours.   losartan (COZAAR) 50 MG tablet Take 1 tablet (50 mg total) by mouth daily.   metoprolol succinate (TOPROL-XL) 50 MG 24 hr tablet Take 0.5 tablet ( 25 mg) twice a day May take extra 25 mg daily if heart rate is over 100 at rest. Take with or immediately following a meal.   metoprolol tartrate (LOPRESSOR) 25 MG tablet Take 1 tablet (25 mg) by mouth once daily as needed for tachycardia   montelukast (SINGULAIR) 10  MG tablet Take 1 tablet (10 mg total) by mouth at bedtime as needed (allergies.).   ondansetron (ZOFRAN) 4 MG tablet Take 4 mg by mouth every 8 (eight) hours as needed for nausea/vomiting.   oxyCODONE-acetaminophen (PERCOCET/ROXICET) 5-325 MG tablet Take 1 tablet by mouth every 4 (four) hours as needed for severe pain.   pregabalin (LYRICA) 100 MG capsule Take 100 mg by mouth 2 (two) times daily.   spironolactone (ALDACTONE) 50 MG tablet Take 25 mg by mouth daily.   Vitamin D, Ergocalciferol, (DRISDOL) 1.25 MG (50000 UNIT) CAPS capsule Take 1 capsule by mouth once a week.   [DISCONTINUED] flecainide (TAMBOCOR) 50 MG tablet Take 1 tablet (50 mg total) by mouth 2 (two) times daily.   [DISCONTINUED] furosemide (LASIX) 40 MG tablet TAKE 1 TABLET BY MOUTH EVERY DAY (Patient taking differently: Take 20 mg by mouth daily.)   [DISCONTINUED] spironolactone (ALDACTONE) 50 MG tablet TAKE 1 TABLET BY MOUTH EVERY DAY     Allergies:   Patient has no known allergies.   Social History   Socioeconomic History   Marital status: Widowed    Spouse name: carisa   Number of children: 2   Years of education: Not on file   Highest education level: High school graduate  Occupational History   Not on file  Tobacco Use   Smoking status: Never   Smokeless tobacco: Never  Vaping Use   Vaping Use: Never used  Substance and Sexual Activity   Alcohol use: Not Currently   Drug use: Never   Sexual activity: Not Currently  Other Topics Concern   Not on file  Social History Narrative   Not on file   Social Determinants of Health   Financial Resource Strain: Low Risk  (09/02/2018)   Overall Financial Resource Strain (CARDIA)    Difficulty of Paying Living Expenses: Not hard at all  Food Insecurity: No Food Insecurity (09/02/2018)   Hunger Vital Sign    Worried About Running Out of Food in the Last Year: Never true    Wells in the Last Year: Never true  Transportation Needs: No Transportation Needs  (09/02/2018)   PRAPARE - Hydrologist (Medical): No    Lack of Transportation (Non-Medical): No  Physical Activity: Insufficiently Active (09/02/2018)   Exercise Vital Sign    Days of Exercise per Week: 2 days    Minutes of Exercise per Session: 30 min  Stress: Stress Concern Present (09/02/2018)   [place]    Feeling of Stress : Very much  Social Connections: Unknown (09/02/2018)   Social Connection and Isolation Panel [NHANES]    Frequency of Communication with Friends and Family: Not on file  Frequency of Social Gatherings with Friends and Family: Not on file    Attends Religious Services: Never    Marine scientist or Organizations: No    Attends Music therapist: Never    Marital Status: Married     Family History: The patient's family history includes COPD in his father and mother; Cancer in his mother; Congestive Heart Failure in his father; Diabetes in his paternal grandfather; Healthy in his daughter and son; Heart attack in his paternal grandfather; Heart attack (age of onset: 81) in his father; Hypertension in his father and sister; Lung cancer in his father.  ROS:   Please see the history of present illness.    All other systems reviewed and are negative.  EKGs/Labs/Other Studies Reviewed:    The following studies were reviewed today:  EKG:  The ekg ordered today demonstrates NSR, rate 64bpm, frequent PACs 1st degree AV block, stable from prior  Recent Labs: 11/10/2021: B Natriuretic Peptide 122.5; TSH 0.427 01/05/2022: ALT 41; BUN 21; Creatinine, Ser 1.33; Hemoglobin 14.8; Magnesium 2.2; Platelets 301; Potassium 3.6; Sodium 139  Recent Lipid Panel    Component Value Date/Time   CHOL 96 05/19/2020 0500   CHOL 117 11/17/2019 1003   TRIG 70 05/19/2020 0500   HDL 27 (L) 05/19/2020 0500   HDL 31 (L) 11/17/2019 1003   CHOLHDL 3.6 05/19/2020 0500   VLDL 14  05/19/2020 0500   LDLCALC 55 05/19/2020 0500   LDLCALC 64 11/17/2019 1003    Physical Exam:    VS:  BP 114/62 (BP Location: Left Arm, Patient Position: Sitting, Cuff Size: Normal)   Pulse 64   Ht 5' 10"  (1.778 m)   Wt 300 lb (136.1 kg)   SpO2 98%   BMI 43.05 kg/m     Wt Readings from Last 3 Encounters:  05/22/22 300 lb (136.1 kg)  04/11/22 293 lb 6.4 oz (133.1 kg)  02/20/22 290 lb (131.5 kg)     GEN: Well nourished, well developed in no acute distress. obese HEENT: Normal NECK: No JVD; No carotid bruits LYMPHATICS: No lymphadenopathy CARDIAC: RRR, no murmurs, rubs, gallops RESPIRATORY:  Clear to auscultation without rales, wheezing or rhonchi  ABDOMEN: Soft, non-tender, non-distended MUSCULOSKELETAL:  1+ edema; No deformity  SKIN: Warm and dry NEUROLOGIC:  Alert and oriented x 3 PSYCHIATRIC:  Normal affect     ASSESSMENT:    1. PSVT (paroxysmal supraventricular tachycardia)   2. First degree heart block   3. OSA (obstructive sleep apnea)   4. Morbid obesity ([place])    PLAN:    In order of problems listed above:  #PSVT Symptomatic atrial tachycardia.  Doing well on flecainide 25 mg by mouth twice daily in addition to metoprolol succinate 25 mg by mouth twice daily.  Continue this regimen.  EKG shows a stable first-degree AV delay.  #Obstructive sleep apnea  #obesity Weight loss encouraged. Increase walking as tolerated  Follow-up 6 months with APP.     Medication Adjustments/Labs and Tests Ordered: Current medicines are reviewed at length with the patient today.  Concerns regarding medicines are outlined above.  Orders Placed This Encounter  Procedures   EKG 12-Lead   Meds ordered this encounter  Medications   flecainide (TAMBOCOR) 50 MG tablet    Sig: Take 0.5 tablets (25 mg total) by mouth 2 (two) times daily.    Dispense:  90 tablet    Refill:  3    Order Specific Question:   Supervising Provider  Answer:   Vickie Epley [[id number]]      Signed, Lars Mage, MD, [facility], [facility] 05/22/2022 3:53 PM    Electrophysiology [facility]

## 2022-05-22 NOTE — Patient Instructions (Signed)
Medication Instructions:  - Your physician recommends that you continue on your current medications as directed. Please refer to the Current Medication list given to you today.  *If you need a refill on your cardiac medications before your next appointment, please call your pharmacy*   Lab Work: - none ordered  If you have labs (blood work) drawn today and your tests are completely normal, you will receive your results only by: [place] (if you have MyChart) OR A paper copy in the mail If you have any lab test that is abnormal or we need to change your treatment, we will call you to review the results.   Testing/Procedures: - none ordered   Follow-Up: At [facility], you and your health needs are our priority.  As part of our continuing mission to provide you with exceptional heart care, we have created designated Provider Care Teams.  These Care Teams include your primary Cardiologist (physician) and Advanced Practice Providers (APPs -  Physician Assistants and Nurse Practitioners) who all work together to provide you with the care you need, when you need it.  We recommend signing up for the patient portal called "MyChart".  Sign up information is provided on this After Visit Summary.  MyChart is used to connect with patients for Virtual Visits (Telemedicine).  Patients are able to view lab/test results, encounter notes, upcoming appointments, etc.  Non-urgent messages can be sent to your provider as well.   To learn more about what you can do with MyChart, go to [email or web address].    Your next appointment:   6 month(s)  The format for your next appointment:   In Person  Provider:   You may see Lars Mage, MD or one of the following Advanced Practice Providers on your designated Care Team:     Mamie Levers, NP  Other Instructions N/a  Important Information About Sugar

## 2022-05-23 ENCOUNTER — Ambulatory Visit: Payer: Medicare Other | Admitting: Adult Health

## 2022-07-08 IMAGING — DX DG CHEST 1V PORT
2 series · 2 of 2 positions shown · non-contrast
Comparison: 09/22/2021

CLINICAL DATA: Chest pain

EXAM:
PORTABLE CHEST 1 VIEW

[chest ap (1 of 2)]
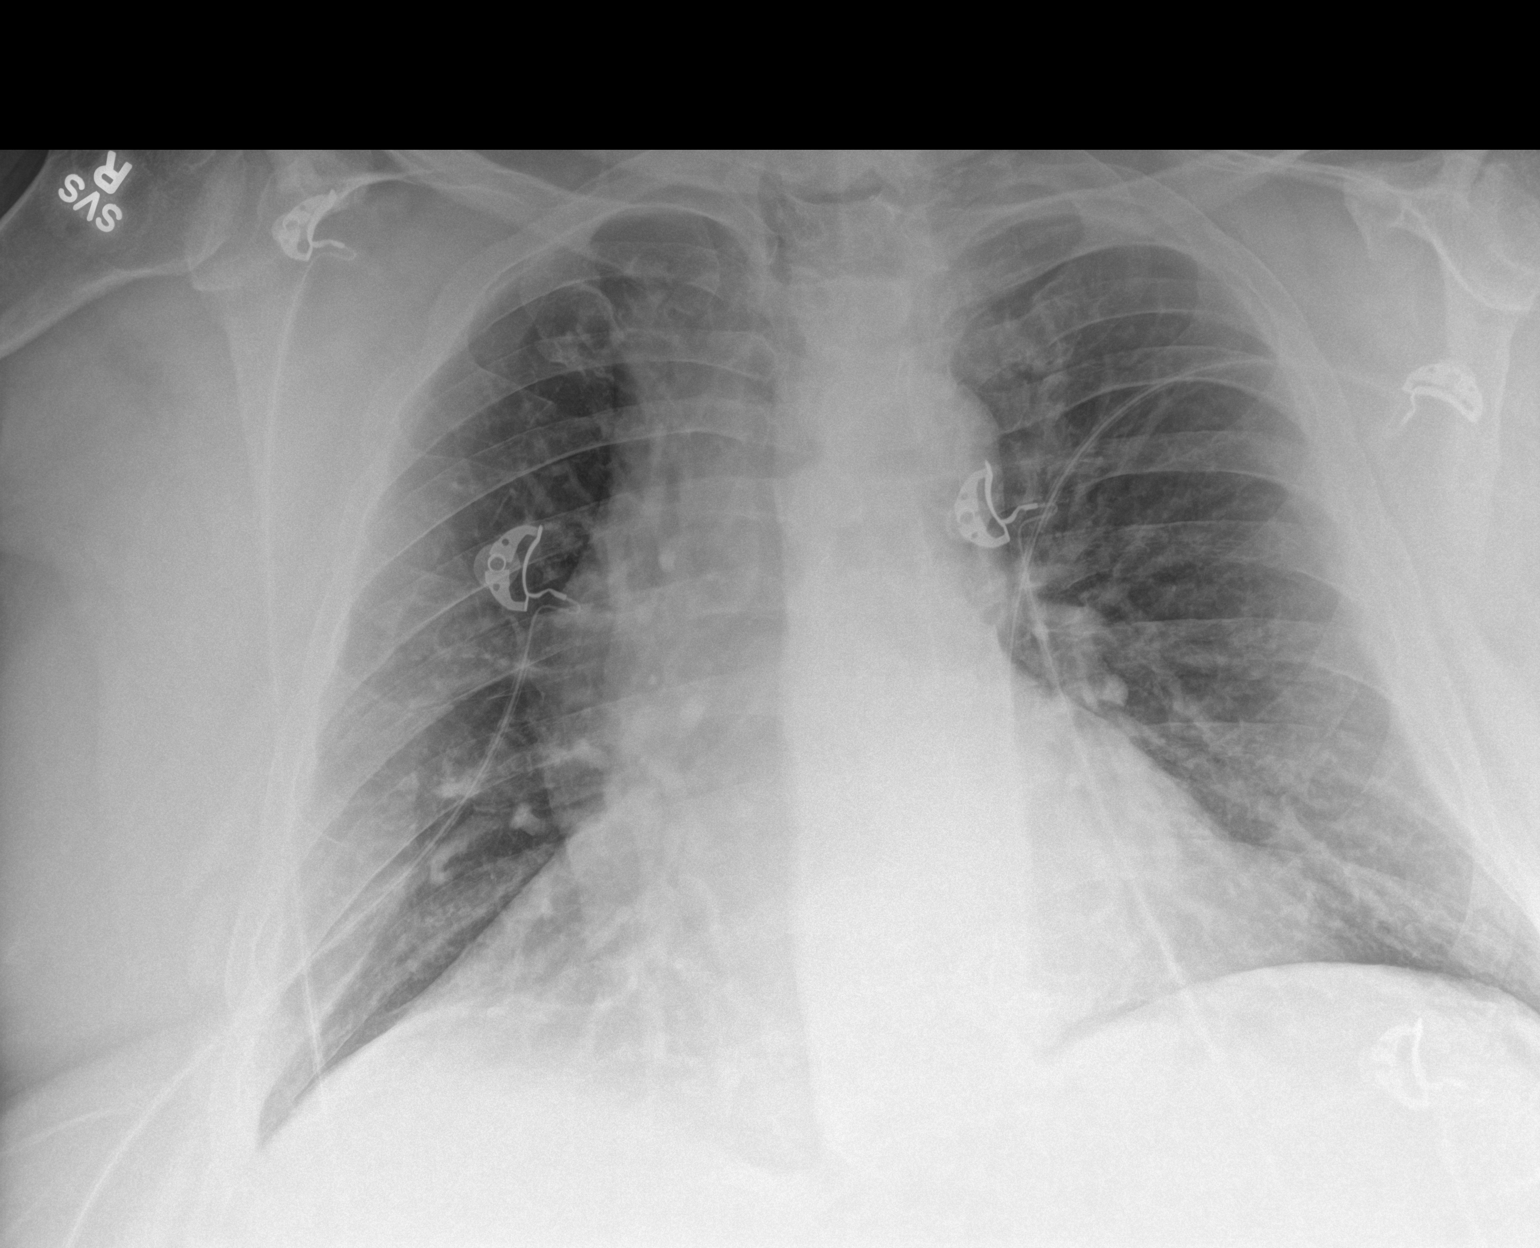

[chest ap (2 of 2)]
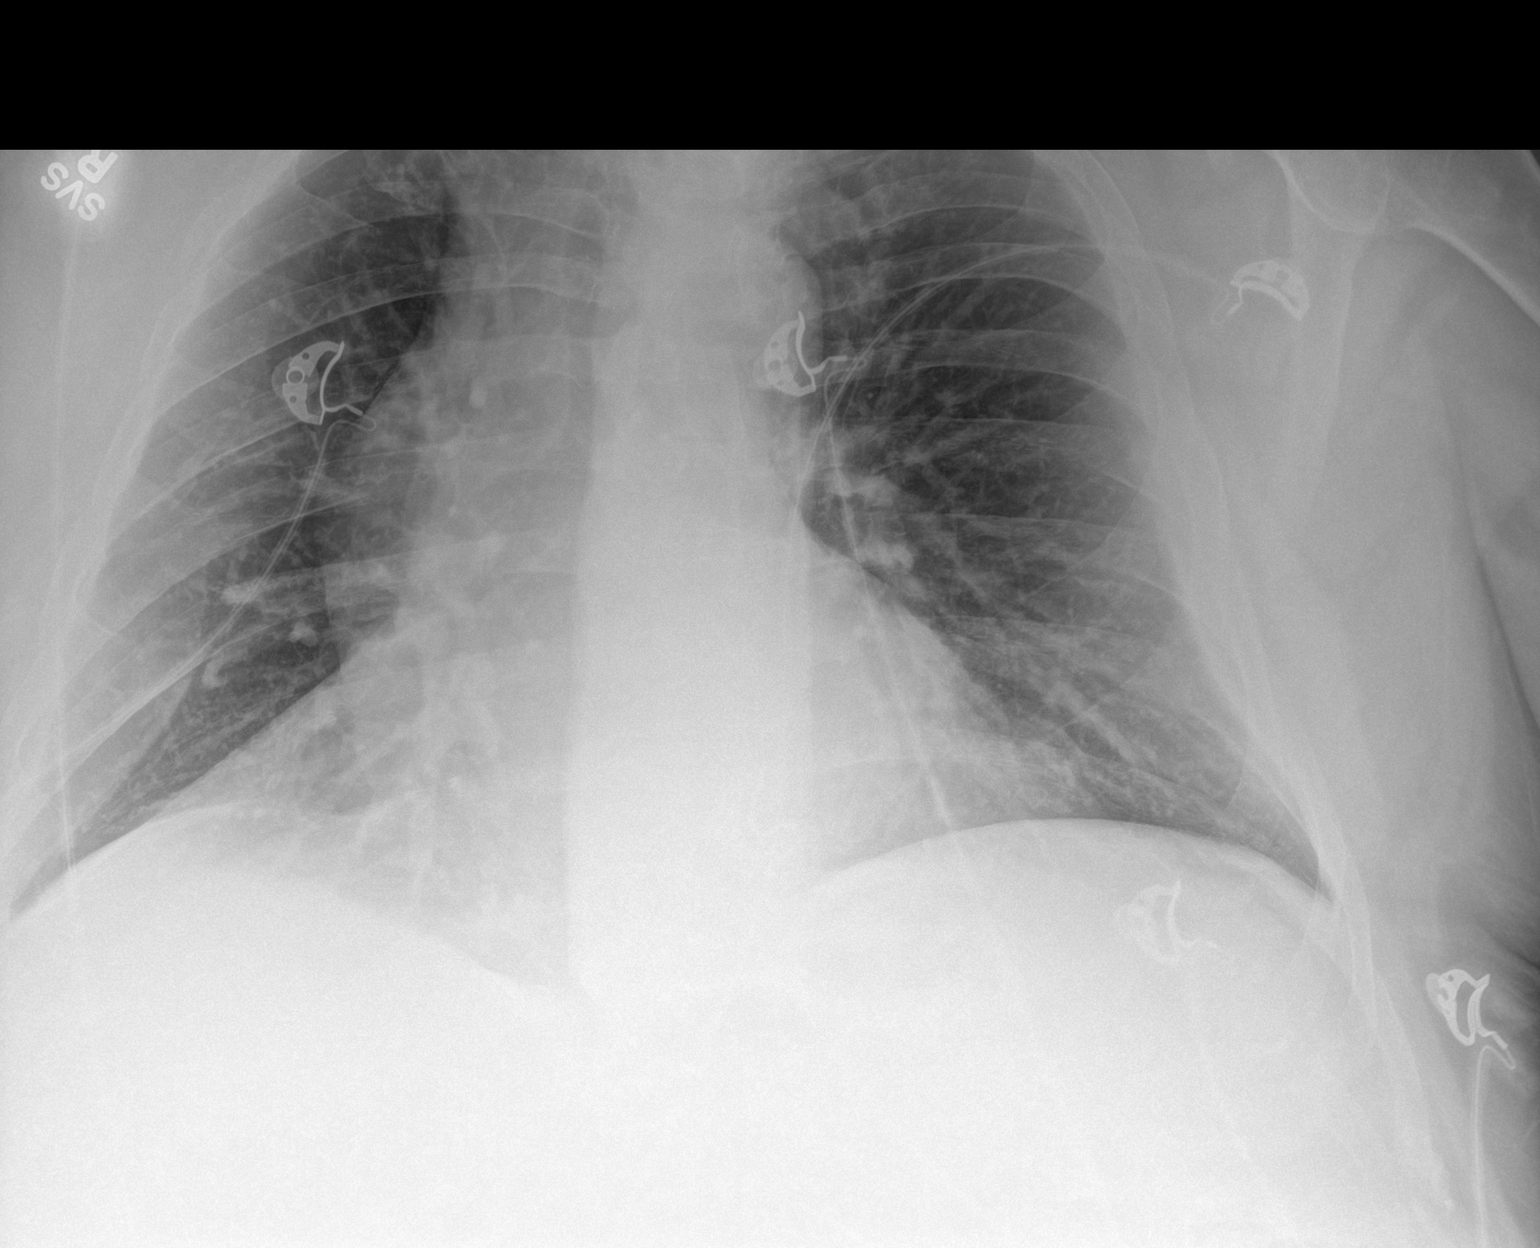

[2 of 2 positions shown; findings below may reference images not displayed]

FINDINGS: Lungs are clear. No pneumothorax or pleural effusion. There is
progressive widening of the right paratracheal soft tissues and
large on the right hilum which may represent vascular shadow in the
setting of an underlying aneurysm. This is not optimally assessed,
particularly on this portable examination. Pulmonary vascularity is
normal. No acute bone abnormality.
IMPRESSION: No radiographic evidence of acute cardiopulmonary disease.

Progressive widening of the right paratracheal soft tissues and
right hilum which may represent vascular shadow or may be
artifactual related to portable technique. A standard two view chest
radiograph may be helpful for further evaluation. Alternatively,
this may be better assessed with dedicated contrast enhanced CT
imaging.

## 2022-07-09 IMAGING — CT CT CHEST W/ CM
2 of 4 series · 15 of 36 positions shown, 18 images · IV contrast (APPLIED)
Comparison: Chest CT dated 05/11/2020 and radiograph dated
10/21/2021.

CLINICAL DATA: Chest pain.

EXAM:
CT CHEST WITH CONTRAST
TECHNIQUE: Multidetector CT imaging of the chest was performed during
intravenous contrast administration.

[Series 2: chest w · axial · 0.87mm/px · z∈[-855,-553]mm · 12 of 179 slices shown, 15 images]
[im 14/179  mediastinal]
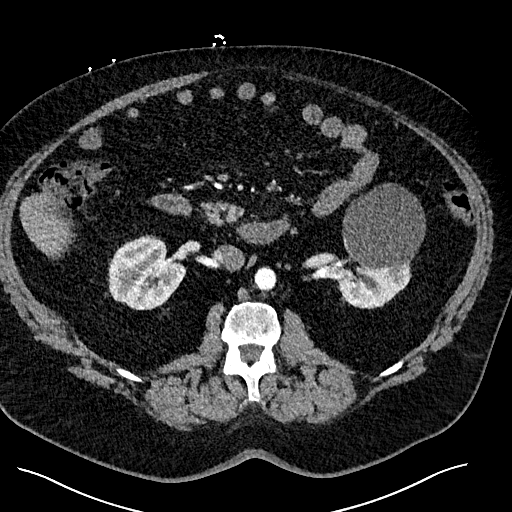
[im 14/179  lung]
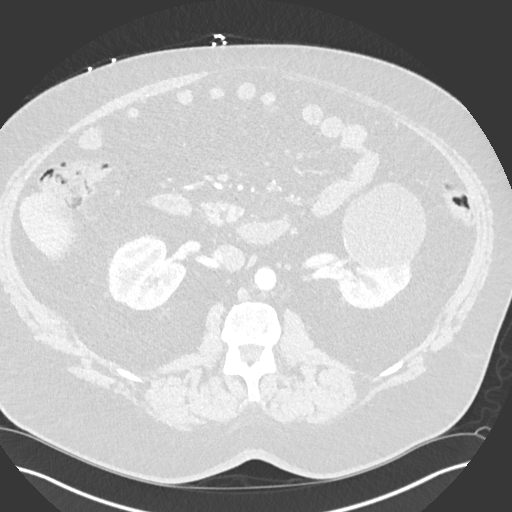
[im 28/179  lung]
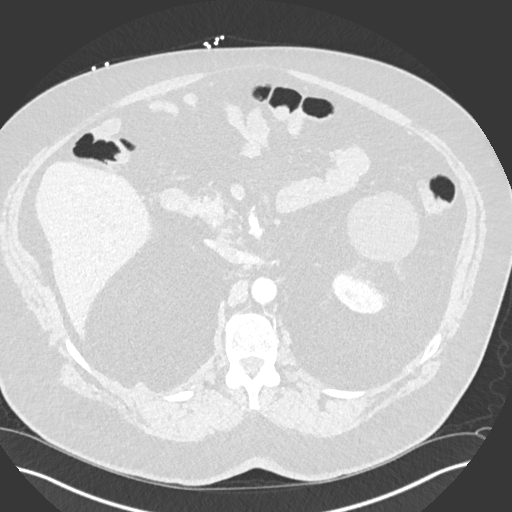
[im 42/179  lung]
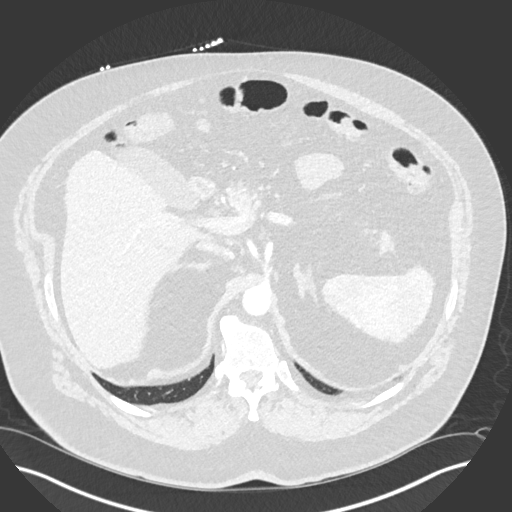
[im 55/179  lung]
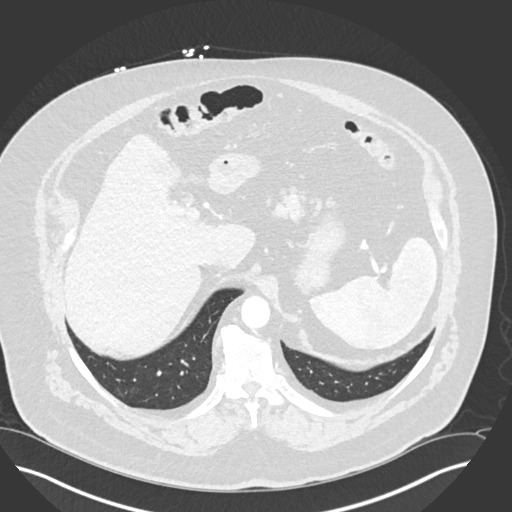
[im 69/179  mediastinal]
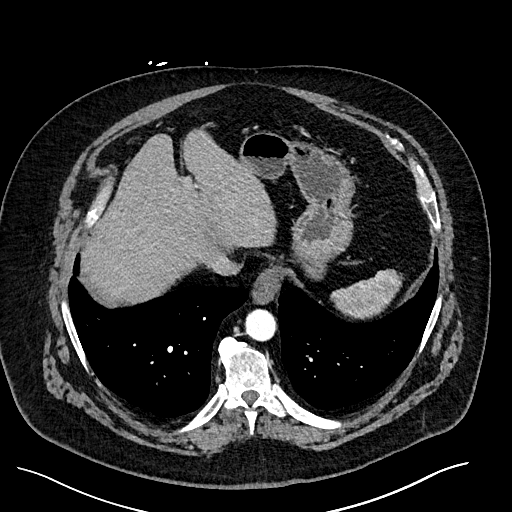
[im 69/179  lung]
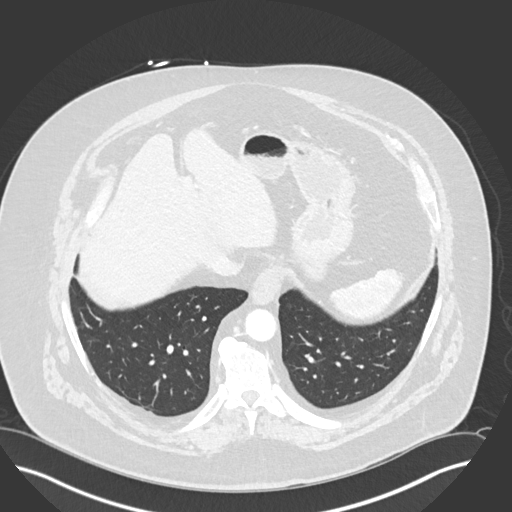
[im 83/179  lung]
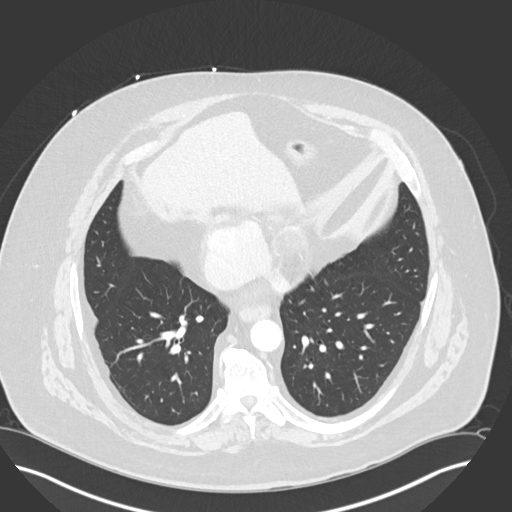
[im 96/179  lung]
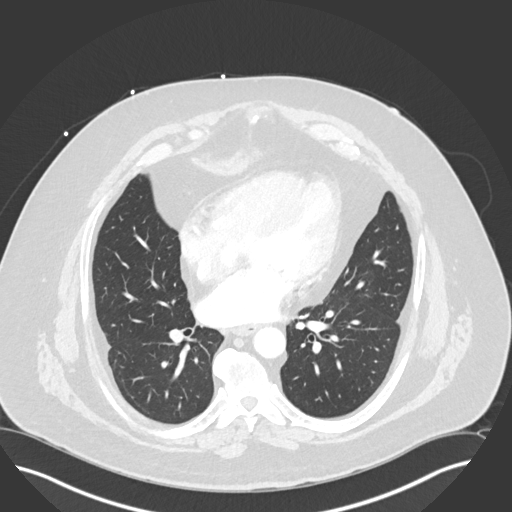
[im 110/179  lung]
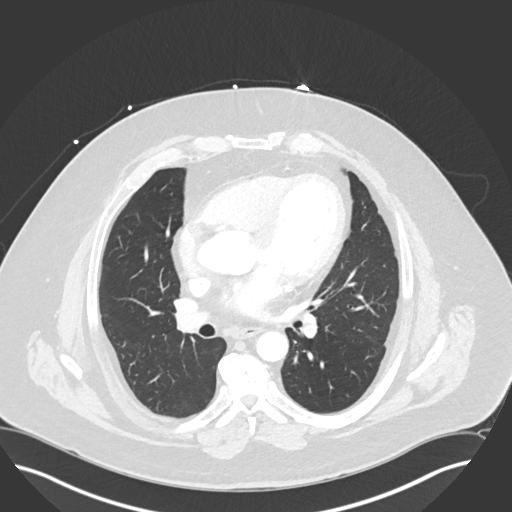
[im 124/179  mediastinal]
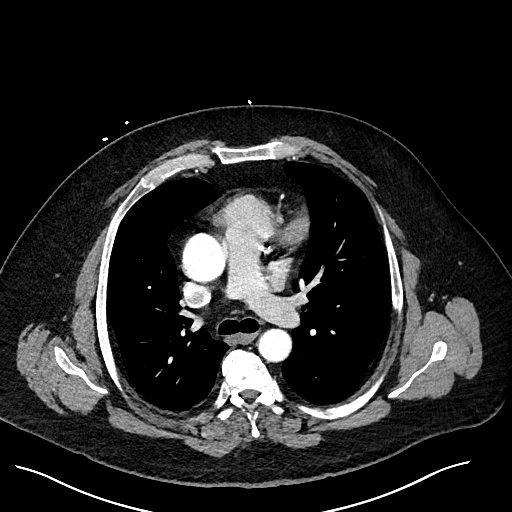
[im 124/179  lung]
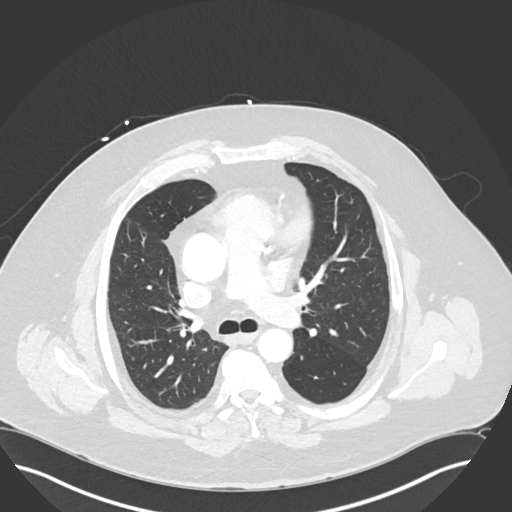
[im 137/179  lung]
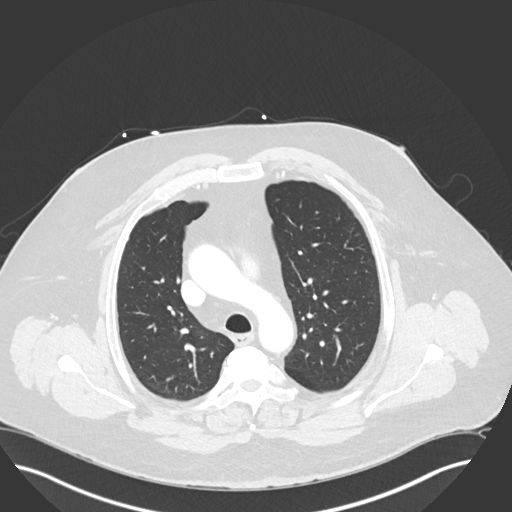
[im 151/179  lung]
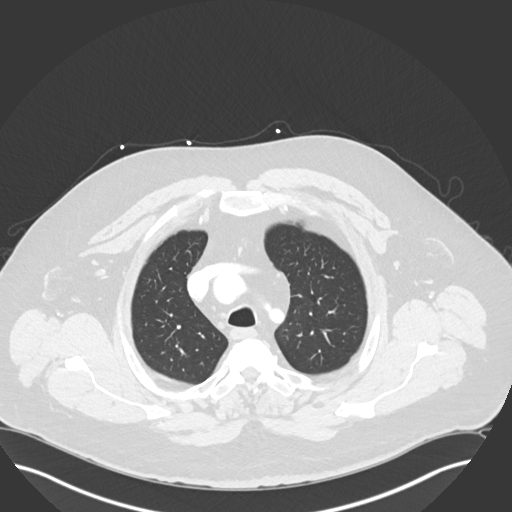
[im 165/179  lung]
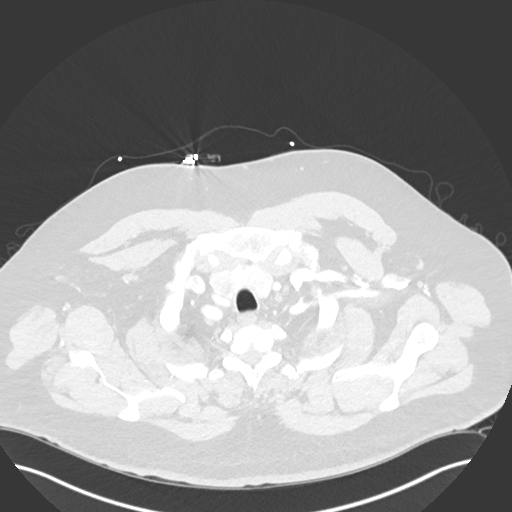

[Series 5: cor · coronal · 0.70mm/px · 3 of 184 slices shown]
[im 37/184  lung]
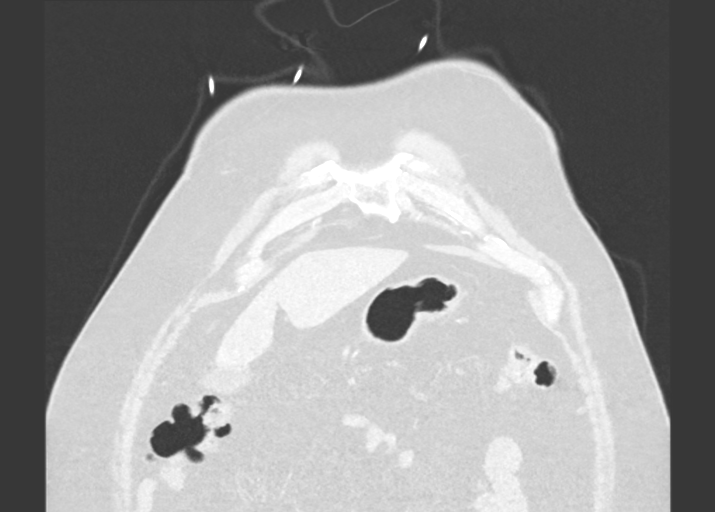
[im 74/184  lung]
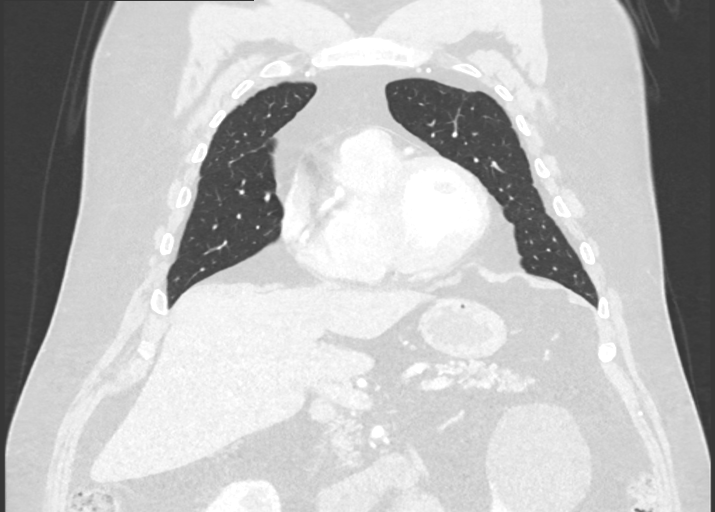
[im 110/184  lung]
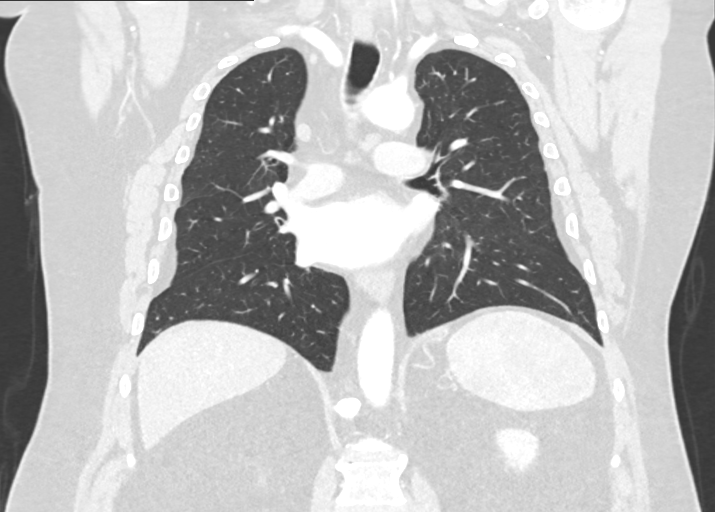

[15 of 36 positions shown; findings below may reference images not displayed]

RADIATION DOSE REDUCTION: This exam was performed according to the
departmental dose-optimization program which includes automated
exposure control, adjustment of the mA and/or kV according to
patient size and/or use of iterative reconstruction technique.

CONTRAST:  75mL OMNIPAQUE IOHEXOL 300 MG/ML  SOLN
FINDINGS: Cardiovascular: There is no cardiomegaly or pericardial effusion.
The thoracic aorta is unremarkable. The origins of the great vessels
of the aortic arch and the central pulmonary arteries appear patent.

Mediastinum/Nodes: No hilar or mediastinal adenopathy. The esophagus
is grossly unremarkable. Bilateral thyroid nodules measure up to 15
mm on the right. This has been evaluated on previous imaging. (ref:
[HOSPITAL]. [DATE]): 143-50).No mediastinal fluid
collection.

Lungs/Pleura: The lungs are clear. There is no pleural effusion
pneumothorax. The central airways are patent.

Upper Abdomen: Subcentimeter fatty lesion in the left adrenal gland,
likely a myelolipoma. A 7 cm left renal cyst.

Musculoskeletal: Degenerative changes of the spine. No acute osseous
pathology.
IMPRESSION: No acute intrathoracic pathology.

## 2022-07-24 ENCOUNTER — Ambulatory Visit: Payer: Medicare Other | Admitting: Internal Medicine

## 2022-07-26 ENCOUNTER — Other Ambulatory Visit: Payer: Self-pay | Admitting: Family

## 2022-07-26 DIAGNOSIS — E559 Vitamin D deficiency, unspecified: Secondary | ICD-10-CM

## 2022-07-29 ENCOUNTER — Ambulatory Visit: Payer: 59

## 2022-07-30 ENCOUNTER — Other Ambulatory Visit: Payer: Self-pay | Admitting: Internal Medicine

## 2022-07-30 ENCOUNTER — Telehealth: Payer: Self-pay

## 2022-07-30 ENCOUNTER — Other Ambulatory Visit: Payer: Self-pay | Admitting: Family

## 2022-07-30 DIAGNOSIS — U071 COVID-19: Secondary | ICD-10-CM

## 2022-07-30 MED ORDER — PAXLOVID (300/100) 20 X 150 MG & 10 X 100MG PO TBPK: 1.0000 | ORAL_TABLET | Freq: Two times a day (BID) | ORAL | 0 refills | Status: DC

## 2022-07-30 NOTE — Telephone Encounter (Signed)
Pt called and left vm regarding that he just tested positive for covid today asked if you can send rx paxlovid for him? Please advise

## 2022-07-31 ENCOUNTER — Other Ambulatory Visit: Payer: Self-pay

## 2022-07-31 DIAGNOSIS — U071 COVID-19: Secondary | ICD-10-CM

## 2022-07-31 MED ORDER — PAXLOVID (300/100) 20 X 150 MG & 10 X 100MG PO TBPK: ORAL_TABLET | ORAL | 0 refills | Status: DC

## 2022-08-12 ENCOUNTER — Other Ambulatory Visit: Payer: Self-pay | Admitting: Internal Medicine

## 2022-08-12 NOTE — Telephone Encounter (Signed)
Called patient and confirmed that he had reduced spironolactone to 25 mg daily.

## 2022-08-12 NOTE — Telephone Encounter (Signed)
The med list shows patient reported taking 1/2 tablet daily during last office visit on 05/22/22 with Dr. Quentin Ore but it is not reflected in the office note. Please advise on refill dose/directions. Thank you!

## 2022-09-07 ENCOUNTER — Other Ambulatory Visit: Payer: Self-pay | Admitting: Family

## 2022-09-09 ENCOUNTER — Other Ambulatory Visit: Payer: Self-pay | Admitting: Family

## 2022-09-14 ENCOUNTER — Other Ambulatory Visit: Payer: Self-pay | Admitting: Nurse Practitioner

## 2022-09-14 ENCOUNTER — Other Ambulatory Visit: Payer: Self-pay | Admitting: Family

## 2022-09-18 ENCOUNTER — Ambulatory Visit: Payer: Medicare Other | Admitting: Internal Medicine

## 2022-09-23 ENCOUNTER — Other Ambulatory Visit: Payer: Self-pay | Admitting: Internal Medicine

## 2022-09-26 ENCOUNTER — Ambulatory Visit: Payer: Medicare Other | Attending: Internal Medicine | Admitting: Internal Medicine

## 2022-09-26 ENCOUNTER — Encounter: Payer: Self-pay | Admitting: Internal Medicine

## 2022-09-26 VITALS — BP 110/78 | HR 65 | Ht 70.0 in | Wt 307.1 lb

## 2022-09-26 DIAGNOSIS — I5032 Chronic diastolic (congestive) heart failure: Secondary | ICD-10-CM | POA: Diagnosis not present

## 2022-09-26 DIAGNOSIS — I10 Essential (primary) hypertension: Secondary | ICD-10-CM

## 2022-09-26 DIAGNOSIS — I471 Supraventricular tachycardia, unspecified: Secondary | ICD-10-CM | POA: Diagnosis not present

## 2022-09-26 NOTE — Progress Notes (Signed)
Follow-up Outpatient Visit Date: 09/26/2022  Primary Care Provider: [facility] [address] [place] [address]  Chief Complaint: Follow-up heart failure and PSVT  HPI:  Mr. Trammel is a 57 y.o. male with history of nonobstructive coronary artery disease, chronic HFpEF, PSVT, hypertension, obstructive sleep apnea, and morbid obesity, who presents for follow-up of HFpEF.  He was last seen in our office by Ward Givens, NP in 04/2022, at which time he was doing well.  Today, Mr. Hiller is without complaints othe rthan intermittent "gas" in his chest.  He denies anginal chest pain as well as shortness of breath.  He notes a few episodes of palpitations that can last up to 30-45 minutes for which he will use an as needed dose of metoprolol tartrate.  He is currently on once daily metoprolol succinate and flecainide based on consultation with Dr. Lalla Brothers.  He inquires about a trial of Farxiga, though he did not tolerate Jardiance in the past due to UTI.  --------------------------------------------------------------------------------------------------  Past Medical History:  Diagnosis Date   ADD (attention deficit disorder)    Chronic back pain    disc   Chronic HFimpEF (heart failure with improved ejection fraction) (HCC)    a. 06/2020 LV gram: EF 35-45%; b. 11/2021 Echo: EF 55%, no rwma, GrI DD, nl RV fxn, mild MR. ao root 42mm.   Crohn's disease (HCC)    Heart murmur    Hypertension    Kidney stone    MDD (major depressive disorder)    NICM (nonischemic cardiomyopathy) (HCC)    a. 06/2020 LV gram: EF 35-45%; b. 11/2021 Echo: EF 55%, GrI DD.   Nonobstructive CAD (coronary artery disease)    a. 10/2011 Stress MV: Equivocal. EF 68%, no ischemia; b. 06/2020 Cath: LM nl, LAD 75m, LCX large, mild diff dzs, RCA mild diff dzs. EF 35-45%; c. 11/2021 ETT: Ex time 3:48. No acute ST/T changes. Max HR 105.   Obesity    OSA (obstructive sleep apnea)    Premature atrial  contractions    a 06/2019 Zio: Predom RSR. Freq PACs (13% burden).   PSVT (paroxysmal supraventricular tachycardia)    Past Surgical History:  Procedure Laterality Date   CARDIAC CATHETERIZATION     COLONOSCOPY N/A 06/14/2021   Procedure: COLONOSCOPY;  Surgeon: Jaynie Collins, DO;  Location: [facility] ENDOSCOPY;  Service: Gastroenterology;  Laterality: N/A;   ESOPHAGOGASTRODUODENOSCOPY N/A 06/14/2021   Procedure: ESOPHAGOGASTRODUODENOSCOPY (EGD);  Surgeon: Jaynie Collins, DO;  Location: [facility] ENDOSCOPY;  Service: Gastroenterology;  Laterality: N/A;   KIDNEY STONE SURGERY     LEFT HEART CATH AND CORONARY ANGIOGRAPHY Left 06/27/2020   Procedure: LEFT HEART CATH AND CORONARY ANGIOGRAPHY;  Surgeon: Yvonne Kendall, MD;  Location: [facility] INVASIVE CV LAB;  Service: Cardiovascular;  Laterality: Left;   VASECTOMY      Current Meds  Medication Sig   acetaminophen (TYLENOL) 325 MG tablet Take 650-975 mg by mouth every 6 (six) hours as needed for moderate pain or headache (for pain.).   albuterol (VENTOLIN HFA) 108 (90 Base) MCG/ACT inhaler Inhale 2 puffs into the lungs every 6 (six) hours as needed for wheezing or shortness of breath.   cetirizine (ZYRTEC) 10 MG tablet Take 1 tablet (10 mg total) by mouth daily as needed (allergies.).   flecainide (TAMBOCOR) 50 MG tablet Take 0.5 tablets (25 mg total) by mouth 2 (two) times daily. (Patient taking differently: Take 25 mg by mouth once.)   fluticasone (FLONASE) 50 MCG/ACT nasal spray Place 2  sprays into both nostrils daily as needed (allergies.).   furosemide (LASIX) 40 MG tablet Take 20 mg by mouth daily.   ipratropium (ATROVENT HFA) 17 MCG/ACT inhaler Inhale 2 puffs into the lungs every 6 (six) hours.   losartan (COZAAR) 50 MG tablet Take 1 tablet (50 mg total) by mouth daily.   metoprolol succinate (TOPROL-XL) 50 MG 24 hr tablet Take 0.5 tablet ( 25 mg) twice a day May take extra 25 mg daily if heart rate is over 100 at rest. Take with or  immediately following a meal. (Patient taking differently: Take 25 mg by mouth daily. Take 0.5 tablet ( 25 mg) twice a day May take extra 25 mg daily if heart rate is over 100 at rest. Take with or immediately following a meal.)   metoprolol tartrate (LOPRESSOR) 25 MG tablet TAKE 1 TABLET (25 MG) BY MOUTH ONCE DAILY AS NEEDED FOR TACHYCARDIA   montelukast (SINGULAIR) 10 MG tablet Take 1 tablet (10 mg total) by mouth at bedtime as needed (allergies.).   ondansetron (ZOFRAN) 4 MG tablet Take 4 mg by mouth every 8 (eight) hours as needed for nausea/vomiting.   oxyCODONE-acetaminophen (PERCOCET/ROXICET) 5-325 MG tablet Take 1 tablet by mouth every 4 (four) hours as needed for severe pain.   pregabalin (LYRICA) 100 MG capsule Take 100 mg by mouth 2 (two) times daily.   spironolactone (ALDACTONE) 50 MG tablet Take 0.5 tablets (25 mg total) by mouth daily.   tamsulosin (FLOMAX) 0.4 MG CAPS capsule TAKE 1 CAPSULE BY MOUTH EVERY DAY   Vitamin D, Ergocalciferol, (DRISDOL) 1.25 MG (50000 UNIT) CAPS capsule TAKE 1 CAPSULE BY MOUTH ONCE WEEKLY    Allergies: Patient has no known allergies.  Social History   Tobacco Use   Smoking status: Never   Smokeless tobacco: Never  Vaping Use   Vaping Use: Never used  Substance Use Topics   Alcohol use: Not Currently   Drug use: Never    Family History  Problem Relation Age of Onset   COPD Mother    Cancer Mother    Hypertension Father    Congestive Heart Failure Father    COPD Father    Lung cancer Father    Heart attack Father 96   Hypertension Sister    Diabetes Paternal Grandfather    Heart attack Paternal Grandfather    Healthy Son    Healthy Daughter     Review of Systems: A 12-system review of systems was performed and was negative except as noted in the HPI.  --------------------------------------------------------------------------------------------------  Physical Exam: BP 110/78 (BP Location: Left Arm, Patient Position: Sitting, Cuff  Size: Large)   Pulse 65   Ht 5\' 10"  (1.778 m)   Wt (!) 307 lb 2 oz (139.3 kg)   SpO2 97%   BMI 44.07 kg/m   General:  NAD.  Accompanied by his wife Neck: No obvious JVD or HJR, though body habitus limits evaluation. Lungs: Clear to auscultation bilaterally without wheezes or crackles. Heart: Regular rate and rhythm without murmurs, rubs, or gallops. Abdomen: Soft, nontender, nondistended. Extremities: Trace pretibial edema.  EKG:  Normal sinus rhythm with first degree AV block (PR interval 292 ms) and anterolateral T wave inversions.  PR interval has decreased since 05/22/2022.  PAC's are also no longer present.  Lab Results  Component Value Date   WBC 8.1 01/05/2022   HGB 14.8 01/05/2022   HCT 45.4 01/05/2022   MCV 90.6 01/05/2022   PLT 301 01/05/2022    Lab Results  Component Value Date   NA 139 01/05/2022   K 3.6 01/05/2022   CL 107 01/05/2022   CO2 26 01/05/2022   BUN 21 (H) 01/05/2022   CREATININE 1.33 (H) 01/05/2022   GLUCOSE 190 (H) 01/05/2022   ALT 41 01/05/2022    Lab Results  Component Value Date   CHOL 96 05/19/2020   HDL 27 (L) 05/19/2020   LDLCALC 55 05/19/2020   TRIG 70 05/19/2020   CHOLHDL 3.6 05/19/2020    --------------------------------------------------------------------------------------------------  ASSESSMENT AND PLAN: Chronic HFpEF: Volume exam is challenging due to Mr. Truxillo' morbid obesity.  However, he reports minimal symptoms.  We will continue his current medications today.  We will defer rechallenging him with an SGLT2 inhibitor given UTI while on Jardiance and high risk for recurrent GU infections.  PSVT and first degree AV block: Palpitations continue sporadically but are better controlled with flecainide and low-dose metoprolol.  PR interval remains prolonged but is a bit shorter compared with last visit with Dr. Lalla Brothers in 05/2022.  Continue daily low-dose metoprolol succinate and flecainide as well as prn metoprolol tartrate  for breakthrough symptoms (Mr. Ardolino reports that Dr. Lalla Brothers was okay with once daily dosing of metoprolol succinate and flecainide, though his last note mentions BID dosing of both).  He is due for EP follow-up in late June.  Morbid obesity: BMI remains > 40.  Weight loss encouraged through diet and exercise.  Follow-up: Return to clinic in 6 months.  Yvonne Kendall, MD 09/26/2022 4:04 PM

## 2022-09-26 NOTE — Patient Instructions (Signed)
Medication Instructions:  Your Physician recommend you continue on your current medication as directed.     *If you need a refill on your cardiac medications before your next appointment, please call your pharmacy*   Lab Work: No labs ordered today.   Testing/Procedures: No test ordered today.    Follow-Up: At [facility], you and your health needs are our priority.  As part of our continuing mission to provide you with exceptional heart care, we have created designated Provider Care Teams.  These Care Teams include your primary Cardiologist (physician) and Advanced Practice Providers (APPs -  Physician Assistants and Nurse Practitioners) who all work together to provide you with the care you need, when you need it.  We recommend signing up for the patient portal called "MyChart".  Sign up information is provided on this After Visit Summary.  MyChart is used to connect with patients for Virtual Visits (Telemedicine).  Patients are able to view lab/test results, encounter notes, upcoming appointments, etc.  Non-urgent messages can be sent to your provider as well.   To learn more about what you can do with MyChart, go to [email or web address].    Your next appointment:   6 month(s)  Provider:   You may see Christopher End, MD or one of the following Advanced Practice Providers on your designated Care Team:   Christopher Berge, NP Ryan Dunn, PA-C Cadence Furth, PA-C Sheri Hammock, NP    

## 2022-09-28 ENCOUNTER — Encounter: Payer: Self-pay | Admitting: Internal Medicine

## 2022-09-29 ENCOUNTER — Other Ambulatory Visit: Payer: Self-pay | Admitting: Cardiology

## 2022-10-11 ENCOUNTER — Ambulatory Visit (INDEPENDENT_AMBULATORY_CARE_PROVIDER_SITE_OTHER): Payer: 59 | Admitting: Family

## 2022-10-11 VITALS — BP 112/68 | HR 67 | Ht 70.0 in | Wt 306.0 lb

## 2022-10-11 DIAGNOSIS — K5732 Diverticulitis of large intestine without perforation or abscess without bleeding: Secondary | ICD-10-CM | POA: Diagnosis not present

## 2022-10-11 DIAGNOSIS — E538 Deficiency of other specified B group vitamins: Secondary | ICD-10-CM

## 2022-10-11 DIAGNOSIS — I4891 Unspecified atrial fibrillation: Secondary | ICD-10-CM

## 2022-10-11 DIAGNOSIS — Z6841 Body Mass Index (BMI) 40.0 and over, adult: Secondary | ICD-10-CM

## 2022-10-11 DIAGNOSIS — I5032 Chronic diastolic (congestive) heart failure: Secondary | ICD-10-CM

## 2022-10-11 DIAGNOSIS — E1165 Type 2 diabetes mellitus with hyperglycemia: Secondary | ICD-10-CM | POA: Diagnosis not present

## 2022-10-11 DIAGNOSIS — I25118 Atherosclerotic heart disease of native coronary artery with other forms of angina pectoris: Secondary | ICD-10-CM

## 2022-10-11 DIAGNOSIS — F321 Major depressive disorder, single episode, moderate: Secondary | ICD-10-CM

## 2022-10-11 DIAGNOSIS — I483 Typical atrial flutter: Secondary | ICD-10-CM

## 2022-10-11 DIAGNOSIS — I471 Supraventricular tachycardia, unspecified: Secondary | ICD-10-CM

## 2022-10-11 MED ORDER — CYANOCOBALAMIN 1000 MCG/ML IJ SOLN
1000.0000 ug | Freq: Once | INTRAMUSCULAR | Status: AC
Start: 2022-10-11 — End: 2022-10-11
  Administered 2022-10-11: 1000 ug via INTRAMUSCULAR

## 2022-10-11 MED ORDER — AMOXICILLIN-POT CLAVULANATE 875-125 MG PO TABS: 1.0000 | ORAL_TABLET | Freq: Two times a day (BID) | ORAL | 1 refills | Status: DC

## 2022-10-15 ENCOUNTER — Encounter: Payer: Self-pay | Admitting: Family

## 2022-10-15 NOTE — Assessment & Plan Note (Signed)
Continue current meds.  Will adjust as needed based on results.  The patient is asked to make an attempt to improve diet and exercise patterns to aid in medical management of this problem. Addressed importance of increasing and maintaining water intake.   

## 2022-10-15 NOTE — Assessment & Plan Note (Signed)
Checking labs today. Will call pt. With results  Continue current diabetes POC, as patient has been well controlled on current regimen.  Will adjust meds if needed based on labs.  

## 2022-10-15 NOTE — Progress Notes (Signed)
Established Patient Office Visit  Subjective:  Patient ID: Aaron [name], male    DOB: 06-21-1965  Age: 57 y.o. MRN: [id number]  Chief Complaint  Patient presents with   Follow-up    Stomach issues    Patient is here with possible recurrence of his diverticulitis.  He says he's having the same symptoms he had immediately prior to his last flare up.   Needs a B12 injection as well.   No other concerns at this time.   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 06/2020 LV gram: EF 35-45%; b. 11/2021 Echo: EF 55%, no rwma, GrI DD, nl RV fxn, mild MR. ao root 42mm.   Cough 07/07/2020   Crohn's disease (HCC)    Elevated troponin 05/18/2020   Heart murmur    Hypertension    Kidney stone    MDD (major depressive disorder)    NICM (nonischemic cardiomyopathy) (HCC)    a. 06/2020 LV gram: EF 35-45%; b. 11/2021 Echo: EF 55%, GrI DD.   Nonobstructive CAD (coronary artery disease)    a. 10/2011 Stress MV: Equivocal. EF 68%, no ischemia; b. 06/2020 Cath: LM nl, LAD 81m, LCX large, mild diff dzs, RCA mild diff dzs. EF 35-45%; c. 11/2021 ETT: Ex time 3:48. No acute ST/T changes. Max HR 105.   Obesity    OSA (obstructive sleep apnea)    Palpitations 03/31/2020   Pneumonia due to COVID-19 virus 05/11/2020   Premature atrial contractions    a 06/2019 Zio: Predom RSR. Freq PACs (13% burden).   PSVT (paroxysmal supraventricular tachycardia)    Shortness of breath 03/31/2020    Past Surgical History:  Procedure Laterality Date   CARDIAC CATHETERIZATION     COLONOSCOPY N/A 06/14/2021   Procedure: COLONOSCOPY;  Surgeon: Jaynie Collins, DO;  Location: [facility] ENDOSCOPY;  Service: Gastroenterology;  Laterality: N/A;    ESOPHAGOGASTRODUODENOSCOPY N/A 06/14/2021   Procedure: ESOPHAGOGASTRODUODENOSCOPY (EGD);  Surgeon: Jaynie Collins, DO;  Location: [facility] ENDOSCOPY;  Service: Gastroenterology;  Laterality: N/A;   KIDNEY STONE SURGERY     LEFT HEART CATH AND CORONARY ANGIOGRAPHY Left 06/27/2020   Procedure: LEFT HEART CATH AND CORONARY ANGIOGRAPHY;  Surgeon: Yvonne Kendall, MD;  Location: [facility] INVASIVE CV LAB;  Service: Cardiovascular;  Laterality: Left;   VASECTOMY      Social History   Socioeconomic History   Marital status: Widowed    Spouse name: carisa   Number of children: 2   Years of education: Not on file   Highest education level: High school graduate  Occupational History   Not on file  Tobacco Use   Smoking status: Never   Smokeless tobacco: Never  Vaping Use   Vaping Use: Never used  Substance and Sexual Activity   Alcohol use: Not Currently   Drug use: Never   Sexual activity: Not Currently  Other Topics Concern   Not on file  Social History Narrative   Not on file   Social Determinants of Health   Financial Resource Strain: Low Risk  (09/02/2018)   Overall Financial Resource Strain (CARDIA)    Difficulty of Paying Living Expenses: Not hard at all  Food Insecurity: No Food Insecurity (09/02/2018)   Hunger  Vital Sign    Worried About Programme researcher, broadcasting/film/video in the Last Year: Never true    Ran Out of Food in the Last Year: Never true  Transportation Needs: No Transportation Needs (09/02/2018)   PRAPARE - Administrator, Civil Service (Medical): No    Lack of Transportation (Non-Medical): No  Physical Activity: Insufficiently Active (09/02/2018)   Exercise Vital Sign    Days of Exercise per Week: 2 days    Minutes of Exercise per Session: 30 min  Stress: Stress Concern Present (09/02/2018)   [employer] of Occupational Health - Occupational Stress Questionnaire    Feeling of Stress : Very much  Social Connections: Unknown (09/02/2018)   Social Connection and  Isolation Panel [NHANES]    Frequency of Communication with Friends and Family: Not on file    Frequency of Social Gatherings with Friends and Family: Not on file    Attends Religious Services: Never    Active Member of Clubs or Organizations: No    Attends Banker Meetings: Never    Marital Status: Married  Catering manager Violence: Not At Risk (09/02/2018)   Humiliation, Afraid, Rape, and Kick questionnaire    Fear of Current or Ex-Partner: No    Emotionally Abused: No    Physically Abused: No    Sexually Abused: No    Family History  Problem Relation Age of Onset   COPD Mother    Cancer Mother    Hypertension Father    Congestive Heart Failure Father    COPD Father    Lung cancer Father    Heart attack Father 77   Hypertension Sister    Diabetes Paternal Grandfather    Heart attack Paternal Grandfather    Healthy Son    Healthy Daughter     No Known Allergies  Review of Systems  Gastrointestinal:  Positive for abdominal pain, diarrhea and nausea.  All other systems reviewed and are negative.      Objective:   BP 112/68   Pulse 67   Ht 5\' 10"  (1.778 m)   Wt (!) 306 lb (138.8 kg)   SpO2 97%   BMI 43.91 kg/m   Vitals:   10/11/22 1342  BP: 112/68  Pulse: 67  Height: 5\' 10"  (1.778 m)  Weight: (!) 306 lb (138.8 kg)  SpO2: 97%  BMI (Calculated): 43.91    Physical Exam Vitals and nursing note reviewed.  Constitutional:      Appearance: Normal appearance. He is normal weight.  Eyes:     Extraocular Movements: Extraocular movements intact.     Conjunctiva/sclera: Conjunctivae normal.     Pupils: Pupils are equal, round, and reactive to light.  Cardiovascular:     Rate and Rhythm: Normal rate and regular rhythm.     Pulses: Normal pulses.     Heart sounds: Normal heart sounds.  Pulmonary:     Effort: Pulmonary effort is normal.     Breath sounds: Normal breath sounds.  Abdominal:     Tenderness: There is abdominal tenderness.   Neurological:     General: No focal deficit present.     Mental Status: He is alert and oriented to person, place, and time. Mental status is at baseline.  Psychiatric:        Mood and Affect: Mood normal.        Behavior: Behavior normal.        Thought Content: Thought content normal.  Judgment: Judgment normal.      No results found for any visits on 10/11/22.  No results found for this or any previous visit (from the past 2160 hour(s)).     Assessment & Plan:   Problem List Items Addressed This Visit       Active Problems   Morbid obesity with BMI of 45.0-49.9, adult (HCC)    Continue current meds.  Will adjust as needed based on results.  The patient is asked to make an attempt to improve diet and exercise patterns to aid in medical management of this problem. Addressed importance of increasing and maintaining water intake.       MDD (major depressive disorder), single episode, moderate (HCC)    Patient well controlled currently, doing well and is not having any depressive symptoms.  Does not want to start more medications.   Will continue reassessing at follow up appointments      Chronic diastolic CHF (congestive heart failure) (HCC) (Chronic)    Patient sees cardiology and is currently well controlled.  Echo in 6/23 showed no changes.  No recent episodes of chest pain or other related symptoms.   Will defer to Cardiology for changes to cardiac treatment.       Coronary artery disease of native artery of native heart with stable angina pectoris [facility])    Patient sees cardiology and is currently well controlled.  No recent episodes of chest pain or other related symptoms.   Will defer to Cardiology for changes to cardiac treatment.       Atrial fibrillation with rapid ventricular response [facility])    Patient sees cardiology and is currently well controlled.  No recent episodes of chest pain or palpitations or related symptoms.  Will defer to  Cardiology for changes to cardiac treatment.       Atrial flutter [facility])    Patient sees cardiology and is currently well controlled.  No recent episodes of chest pain or palpitations or related symptoms.  Will defer to Cardiology for changes to cardiac treatment.       Type 2 diabetes mellitus with hyperglycemia (HCC) - Primary    Checking labs today. Will call pt. With results  Continue current diabetes POC, as patient has been well controlled on current regimen.  Will adjust meds if needed based on labs.       Other Visit Diagnoses     Diverticulitis large intestine w/o perforation or abscess w/o bleeding       sending him some antibiotics.  I Have reminded him to take the entire course, even if he starts to feel better.  Will reassess at follow up.   Relevant Medications   amoxicillin-clavulanate (AUGMENTIN) 875-125 MG tablet   Vitamin B12 deficiency       Relevant Medications   cyanocobalamin (VITAMIN B12) injection 1,000 mcg (Completed)   Atrial fibrillation, unspecified type (HCC)   (Chronic)     SVT (supraventricular tachycardia)   (Chronic)         Return in about 1 month (around 11/11/2022) for F/U.   Total time spent: 30 minutes  Miki Kins, FNP  10/11/2022

## 2022-10-15 NOTE — Assessment & Plan Note (Signed)
Patient sees cardiology and is currently well controlled.  Echo in 6/23 showed no changes.  No recent episodes of chest pain or other related symptoms.   Will defer to Cardiology for changes to cardiac treatment.

## 2022-10-15 NOTE — Assessment & Plan Note (Signed)
Patient sees cardiology and is currently well controlled.  No recent episodes of chest pain or palpitations or related symptoms.  Will defer to Cardiology for changes to cardiac treatment.

## 2022-10-15 NOTE — Assessment & Plan Note (Signed)
Patient sees cardiology and is currently well controlled.  No recent episodes of chest pain or palpitations or related symptoms.  Will defer to Cardiology for changes to cardiac treatment.  

## 2022-10-15 NOTE — Assessment & Plan Note (Signed)
Patient sees cardiology and is currently well controlled.  No recent episodes of chest pain or other related symptoms.   Will defer to Cardiology for changes to cardiac treatment.

## 2022-10-15 NOTE — Assessment & Plan Note (Signed)
Patient well controlled currently, doing well and is not having any depressive symptoms.  Does not want to start more medications.   Will continue reassessing at follow up appointments

## 2022-10-21 ENCOUNTER — Other Ambulatory Visit: Payer: Self-pay | Admitting: Family Medicine

## 2022-10-21 DIAGNOSIS — N200 Calculus of kidney: Secondary | ICD-10-CM

## 2022-10-22 ENCOUNTER — Ambulatory Visit: Payer: Medicare Other | Admitting: Urology

## 2022-11-03 ENCOUNTER — Other Ambulatory Visit: Payer: Self-pay | Admitting: Family

## 2022-11-03 DIAGNOSIS — H6993 Unspecified Eustachian tube disorder, bilateral: Secondary | ICD-10-CM

## 2022-11-07 ENCOUNTER — Ambulatory Visit: Payer: Medicare Other | Admitting: Urology

## 2022-11-11 ENCOUNTER — Ambulatory Visit
Admission: RE | Admit: 2022-11-11 | Discharge: 2022-11-11 | Disposition: A | Discharge status: Home / Self Care | Payer: Medicare Other | Attending: Family | Admitting: Family

## 2022-11-11 ENCOUNTER — Ambulatory Visit (INDEPENDENT_AMBULATORY_CARE_PROVIDER_SITE_OTHER): Payer: Medicare Other | Admitting: Family

## 2022-11-11 ENCOUNTER — Encounter: Payer: Self-pay | Admitting: Family

## 2022-11-11 ENCOUNTER — Ambulatory Visit
Admission: RE | Admit: 2022-11-11 | Discharge: 2022-11-11 | Disposition: A | Discharge status: Home / Self Care | Payer: Medicare Other | Source: Ambulatory Visit | Attending: Family | Admitting: Family

## 2022-11-11 VITALS — BP 124/68 | HR 69 | Ht 70.0 in | Wt 307.8 lb

## 2022-11-11 DIAGNOSIS — N200 Calculus of kidney: Secondary | ICD-10-CM

## 2022-11-11 DIAGNOSIS — E538 Deficiency of other specified B group vitamins: Secondary | ICD-10-CM | POA: Diagnosis not present

## 2022-11-11 DIAGNOSIS — R0981 Nasal congestion: Secondary | ICD-10-CM | POA: Diagnosis not present

## 2022-11-11 DIAGNOSIS — I10 Essential (primary) hypertension: Secondary | ICD-10-CM | POA: Diagnosis not present

## 2022-11-11 DIAGNOSIS — Z6841 Body Mass Index (BMI) 40.0 and over, adult: Secondary | ICD-10-CM

## 2022-11-11 DIAGNOSIS — E1165 Type 2 diabetes mellitus with hyperglycemia: Secondary | ICD-10-CM | POA: Diagnosis not present

## 2022-11-11 DIAGNOSIS — E782 Mixed hyperlipidemia: Secondary | ICD-10-CM | POA: Diagnosis not present

## 2022-11-11 DIAGNOSIS — E559 Vitamin D deficiency, unspecified: Secondary | ICD-10-CM

## 2022-11-11 DIAGNOSIS — I4891 Unspecified atrial fibrillation: Secondary | ICD-10-CM

## 2022-11-11 DIAGNOSIS — R5383 Other fatigue: Secondary | ICD-10-CM

## 2022-11-11 DIAGNOSIS — I25118 Atherosclerotic heart disease of native coronary artery with other forms of angina pectoris: Secondary | ICD-10-CM

## 2022-11-11 LAB — POC CREATINE & ALBUMIN,URINE
Creatinine, POC: 50 mg/dL
Microalbumin Ur, POC: 10 mg/L

## 2022-11-11 MED ORDER — OMEPRAZOLE 40 MG PO CPDR: 40.0000 mg | DELAYED_RELEASE_CAPSULE | Freq: Every day | ORAL | 1 refills | Status: DC

## 2022-11-11 MED ORDER — CYANOCOBALAMIN 1000 MCG/ML IJ SOLN
1000.0000 ug | Freq: Once | INTRAMUSCULAR | Status: AC
Start: 2022-11-11 — End: 2022-11-11
  Administered 2022-11-11: 1000 ug via INTRAMUSCULAR

## 2022-11-11 MED ORDER — FLUTICASONE PROPIONATE 50 MCG/ACT NA SUSP
2.0000 | Freq: Every day | NASAL | 5 refills | Status: AC | PRN
Start: 2022-11-11 — End: ?

## 2022-11-11 NOTE — Assessment & Plan Note (Signed)
Patient sees cardiology and is currently well controlled.  No recent episodes of chest pain or palpitations or related symptoms.  Will defer to Cardiology for changes to cardiac treatment.  

## 2022-11-11 NOTE — Assessment & Plan Note (Signed)
Checking labs today.  Continue current therapy for lipid control. Will modify as needed based on labwork results.  

## 2022-11-11 NOTE — Assessment & Plan Note (Signed)
Checking labs today. Will call pt. With results  Continue current diabetes POC, as patient has been well controlled on current regimen.  Will adjust meds if needed based on labs.  

## 2022-11-11 NOTE — Progress Notes (Signed)
Established Patient Office Visit  Subjective:  Patient ID: Aaron [name], male    DOB: [birth year]  Age: 57 y.o. MRN: [id number]  Chief Complaint  Patient presents with   Follow-up    1 month follow up    Patient is here today for his 3 months follow up.  He has been feeling well since last appointment.   He does not have additional concerns to discuss today.  Labs are due today. He needs refills.   I have reviewed his active problem list, medication list, allergies, health maintenance, notes from last encounter, and lab results for his appointment today.      No other concerns at this time.   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 06/2020 LV gram: EF 35-45%; b. 11/2021 Echo: EF 55%, no rwma, GrI DD, nl RV fxn, mild MR. ao root 42mm.   Cough 07/07/2020   Crohn's disease (HCC)    Elevated troponin 05/18/2020   Heart murmur    Hypertension    Hypokalemia 05/18/2020   Kidney stone    MDD (major depressive disorder)    NICM (nonischemic cardiomyopathy) (HCC)    a. 06/2020 LV gram: EF 35-45%; b. 11/2021 Echo: EF 55%, GrI DD.   Nonobstructive CAD (coronary artery disease)    a. 10/2011 Stress MV: Equivocal. EF 68%, no ischemia; b. 06/2020 Cath: LM nl, LAD 34m, LCX large, mild diff dzs, RCA mild diff dzs. EF 35-45%; c. 11/2021 ETT: Ex time 3:48. No acute ST/T changes. Max HR 105.   Obesity    OSA (obstructive sleep apnea)    Palpitations 03/31/2020   Pneumonia due to COVID-19 virus 05/11/2020   Premature atrial contractions    a 06/2019 Zio: Predom RSR. Freq PACs (13% burden).   PSVT (paroxysmal supraventricular tachycardia)    Shortness of breath 03/31/2020    Past Surgical History:  Procedure Laterality Date   CARDIAC  CATHETERIZATION     COLONOSCOPY N/A 06/14/2021   Procedure: COLONOSCOPY;  Surgeon: Jaynie Collins, DO;  Location: [facility] ENDOSCOPY;  Service: Gastroenterology;  Laterality: N/A;   ESOPHAGOGASTRODUODENOSCOPY N/A 06/14/2021   Procedure: ESOPHAGOGASTRODUODENOSCOPY (EGD);  Surgeon: Jaynie Collins, DO;  Location: [facility] ENDOSCOPY;  Service: Gastroenterology;  Laterality: N/A;   KIDNEY STONE SURGERY     LEFT HEART CATH AND CORONARY ANGIOGRAPHY Left 06/27/2020   Procedure: LEFT HEART CATH AND CORONARY ANGIOGRAPHY;  Surgeon: Yvonne Kendall, MD;  Location: [facility] INVASIVE CV LAB;  Service: Cardiovascular;  Laterality: Left;   VASECTOMY      Social History   Socioeconomic History   Marital status: Widowed    Spouse name: carisa   Number of children: 2   Years of education: Not on file   Highest education level: High school graduate  Occupational History   Not on file  Tobacco Use   Smoking status: Never   Smokeless tobacco: Never  Vaping Use   Vaping Use: Never used  Substance and Sexual Activity   Alcohol use: Not Currently   Drug use: Never   Sexual activity: Not Currently  Other Topics Concern   Not on file  Social History Narrative   Not on file   Social Determinants of Health  Financial Resource Strain: Low Risk  (09/02/2018)   Overall Financial Resource Strain (CARDIA)    Difficulty of Paying Living Expenses: Not hard at all  Food Insecurity: No Food Insecurity (09/02/2018)   Hunger Vital Sign    Worried About Running Out of Food in the Last Year: Never true    Ran Out of Food in the Last Year: Never true  Transportation Needs: No Transportation Needs (09/02/2018)   PRAPARE - Administrator, Civil Service (Medical): No    Lack of Transportation (Non-Medical): No  Physical Activity: Insufficiently Active (09/02/2018)   Exercise Vital Sign    Days of Exercise per Week: 2 days    Minutes of Exercise per Session: 30 min  Stress: Stress Concern Present (09/02/2018)    [employer] of Occupational Health - Occupational Stress Questionnaire    Feeling of Stress : Very much  Social Connections: Unknown (09/02/2018)   Social Connection and Isolation Panel [NHANES]    Frequency of Communication with Friends and Family: Not on file    Frequency of Social Gatherings with Friends and Family: Not on file    Attends Religious Services: Never    Active Member of Clubs or Organizations: No    Attends Banker Meetings: Never    Marital Status: Married  Catering manager Violence: Not At Risk (09/02/2018)   Humiliation, Afraid, Rape, and Kick questionnaire    Fear of Current or Ex-Partner: No    Emotionally Abused: No    Physically Abused: No    Sexually Abused: No    Family History  Problem Relation Age of Onset   COPD Mother    Cancer Mother    Hypertension Father    Congestive Heart Failure Father    COPD Father    Lung cancer Father    Heart attack Father 47   Hypertension Sister    Diabetes Paternal Grandfather    Heart attack Paternal Grandfather    Healthy Son    Healthy Daughter     No Known Allergies  Review of Systems  All other systems reviewed and are negative.      Objective:   BP 124/68   Pulse 69   Ht 5\' 10"  (1.778 m)   Wt (!) 307 lb 12.8 oz (139.6 kg)   SpO2 96%   BMI 44.16 kg/m   Vitals:   11/11/22 1319  BP: 124/68  Pulse: 69  Height: 5\' 10"  (1.778 m)  Weight: (!) 307 lb 12.8 oz (139.6 kg)  SpO2: 96%  BMI (Calculated): 44.16    Physical Exam Vitals and nursing note reviewed.  Constitutional:      Appearance: Normal appearance. He is obese.  Eyes:     Extraocular Movements: Extraocular movements intact.     Conjunctiva/sclera: Conjunctivae normal.     Pupils: Pupils are equal, round, and reactive to light.  Cardiovascular:     Rate and Rhythm: Normal rate and regular rhythm.     Pulses: Normal pulses.     Heart sounds: Normal heart sounds.  Pulmonary:     Effort: Pulmonary effort is  normal.     Breath sounds: Normal breath sounds.  Neurological:     General: No focal deficit present.     Mental Status: He is alert and oriented to person, place, and time. Mental status is at baseline.  Psychiatric:        Mood and Affect: Mood normal.        Behavior: Behavior normal.  Judgment: Judgment normal.      Results for orders placed or performed in visit on 11/11/22  POC CREATINE & ALBUMIN,URINE  Result Value Ref Range   Microalbumin Ur, POC 10 mg/L   Creatinine, POC 50 mg/dL   Albumin/Creatinine Ratio, Urine, POC 30-300     Recent Results (from the past 2160 hour(s))  POC CREATINE & ALBUMIN,URINE     Status: Abnormal   Collection Time: 11/11/22  2:08 PM  Result Value Ref Range   Microalbumin Ur, POC 10 mg/L   Creatinine, POC 50 mg/dL   Albumin/Creatinine Ratio, Urine, POC 30-300        Assessment & Plan:   Problem List Items Addressed This Visit       Active Problems   Essential hypertension    Blood pressure well controlled with current medications.  Continue current therapy.  Will reassess at follow up.       Renal calculi    X-ray ordered at [facility] today.  Pt would prefer to have this there instead of in the hospital.        Relevant Orders   CBC With Differential   CMP14+EGFR   DG Abd 1 View   Morbid obesity with BMI of 45.0-49.9, adult (HCC)    Continue current meds.  Will adjust as needed based on results.  The patient is asked to make an attempt to improve diet and exercise patterns to aid in medical management of this problem. Addressed importance of increasing and maintaining water intake.        Coronary artery disease of native artery of native heart with stable angina pectoris [facility])    Patient sees cardiology and is currently well controlled.  No recent episodes of chest pain or other related symptoms.   Will defer to Cardiology for changes to cardiac treatment.       Atrial fibrillation with rapid ventricular response  [facility])    Patient sees cardiology and is currently well controlled.  No recent episodes of chest pain or palpitations or related symptoms.  Will defer to Cardiology for changes to cardiac treatment.       Mixed hyperlipidemia    Checking labs today.  Continue current therapy for lipid control. Will modify as needed based on labwork results.        Relevant Orders   Lipid panel   CBC With Differential   CMP14+EGFR   Type 2 diabetes mellitus with hyperglycemia (HCC) - Primary    Checking labs today. Will call pt. With results  Continue current diabetes POC, as patient has been well controlled on current regimen.  Will adjust meds if needed based on labs.       Relevant Orders   CBC With Differential   CMP14+EGFR   Hemoglobin A1c   POC CREATINE & ALBUMIN,URINE (Completed)   Other Visit Diagnoses     Sinus congestion       Relevant Medications   fluticasone (FLONASE) 50 MCG/ACT nasal spray   Other Relevant Orders   CBC With Differential   CMP14+EGFR   Vitamin B12 deficiency       Checking labs today.  Will continue supplements as needed.   Relevant Medications   cyanocobalamin (VITAMIN B12) injection 1,000 mcg (Completed)   Other Relevant Orders   CBC With Differential   CMP14+EGFR   Vitamin B12   Vitamin D deficiency, unspecified       Checking labs today.  Will continue supplements as needed.   Relevant Orders  VITAMIN D 25 Hydroxy (Vit-D Deficiency, Fractures)   CBC With Differential   CMP14+EGFR   Other fatigue       Relevant Orders   CBC With Differential   CMP14+EGFR   TSH       Return in about 3 months (around 02/11/2023) for F/U, 1 month for B12.   Total time spent: 30 minutes  Miki Kins, FNP  11/11/2022   This document may have been prepared by [facility] Voice Recognition software and as such may include unintentional dictation errors.

## 2022-11-11 NOTE — Assessment & Plan Note (Signed)
X-ray ordered at [facility] today.  Pt would prefer to have this there instead of in the hospital.

## 2022-11-11 NOTE — Assessment & Plan Note (Signed)
Blood pressure well controlled with current medications.  Continue current therapy.  Will reassess at follow up.  

## 2022-11-11 NOTE — Assessment & Plan Note (Signed)
Patient sees cardiology and is currently well controlled.  No recent episodes of chest pain or other related symptoms.   Will defer to Cardiology for changes to cardiac treatment.  

## 2022-11-11 NOTE — Assessment & Plan Note (Signed)
Continue current meds.  Will adjust as needed based on results.  The patient is asked to make an attempt to improve diet and exercise patterns to aid in medical management of this problem. Addressed importance of increasing and maintaining water intake.   

## 2022-11-12 LAB — CBC WITH DIFFERENTIAL
Basophils Absolute: 0.1 10*3/uL (ref 0.0–0.2)
Basos: 1 %
EOS (ABSOLUTE): 0.3 10*3/uL (ref 0.0–0.4)
Eos: 3 %
Hematocrit: 47.9 % (ref 37.5–51.0)
Hemoglobin: 16.1 g/dL (ref 13.0–17.7)
Immature Grans (Abs): 0 10*3/uL (ref 0.0–0.1)
Immature Granulocytes: 0 %
Lymphocytes Absolute: 3.1 10*3/uL (ref 0.7–3.1)
Lymphs: 34 %
MCH: 30 pg (ref 26.6–33.0)
MCHC: 33.6 g/dL (ref 31.5–35.7)
MCV: 89 fL (ref 79–97)
Monocytes Absolute: 1 10*3/uL — ABNORMAL HIGH (ref 0.1–0.9)
Monocytes: 11 %
Neutrophils Absolute: 4.6 10*3/uL (ref 1.4–7.0)
Neutrophils: 51 %
RBC: 5.37 x10E6/uL (ref 4.14–5.80)
RDW: 11.9 % (ref 11.6–15.4)
WBC: 9 10*3/uL (ref 3.4–10.8)

## 2022-11-12 LAB — CMP14+EGFR
ALT: 38 IU/L (ref 0–44)
AST: 22 IU/L (ref 0–40)
Albumin/Globulin Ratio: 1.4
Albumin: 4.1 g/dL (ref 3.8–4.9)
Alkaline Phosphatase: 93 IU/L (ref 44–121)
BUN/Creatinine Ratio: 16 (ref 9–20)
BUN: 15 mg/dL (ref 6–24)
Bilirubin Total: 0.2 mg/dL (ref 0.0–1.2)
CO2: 21 mmol/L (ref 20–29)
Calcium: 9.3 mg/dL (ref 8.7–10.2)
Chloride: 105 mmol/L (ref 96–106)
Creatinine, Ser: 0.95 mg/dL (ref 0.76–1.27)
Globulin, Total: 3 g/dL (ref 1.5–4.5)
Glucose: 68 mg/dL — ABNORMAL LOW (ref 70–99)
Potassium: 4.3 mmol/L (ref 3.5–5.2)
Sodium: 140 mmol/L (ref 134–144)
Total Protein: 7.1 g/dL (ref 6.0–8.5)
eGFR: 94 mL/min/{1.73_m2} (ref >59)

## 2022-11-12 LAB — LIPID PANEL
Chol/HDL Ratio: 4 ratio (ref 0.0–5.0)
Cholesterol, Total: 124 mg/dL (ref 100–199)
HDL: 31 mg/dL — ABNORMAL LOW (ref >39)
LDL Chol Calc (NIH): 63 mg/dL (ref 0–99)
Triglycerides: 180 mg/dL — ABNORMAL HIGH (ref 0–149)
VLDL Cholesterol Cal: 30 mg/dL (ref 5–40)

## 2022-11-12 LAB — HEMOGLOBIN A1C
Est. average glucose Bld gHb Est-mCnc: 126 mg/dL
Hgb A1c MFr Bld: 6 % — ABNORMAL HIGH (ref 4.8–5.6)

## 2022-11-12 LAB — TSH: TSH: 0.991 u[IU]/mL (ref 0.450–4.500)

## 2022-11-12 LAB — VITAMIN B12: Vitamin B-12: >2000 pg/mL — ABNORMAL HIGH (ref 232–1245)

## 2022-11-12 LAB — VITAMIN D 25 HYDROXY (VIT D DEFICIENCY, FRACTURES): Vit D, 25-Hydroxy: 45.8 ng/mL (ref 30.0–100.0)

## 2022-11-12 NOTE — Progress Notes (Signed)
11/15/22 7:04 PM   Aaron [name] 04-27-1966 [id number]  Referring provider:  [facility] [address] [place],  [place] [address]   Urological history  1. BPH with LU TS -PSA 0.8 in 02/2021   2. Nephrolithiasis -surgery for kidney stones about 20 years ago -5 incidences of kidney stones   3. Intermediate Risk Hematuria -non-smoker -CT abdomen and pelvis noncontrast on 01/02/2021 that revealed an enlarged prostate gland measuring 5.0 cm in transverse diameter with focal calcifications. It also revealed 5 mm proximal left ureteral calculus without significant ureterectasis or pyelocaliectasis, a 5 mm nonobstructive right upper pole renal calculus and a 7.25 cm exophytic left upper pole renal cyst   4. Left renal cyst - seen on previous imaging, stable and benign; no further evaluation needed.  - MRI in 2019 for further characterization was benign   Chief Complaint  Patient presents with   Nephrolithiasis     HPI: Aaron [name] is a 57 y.o.male who presents today for a 6 month follow-up  He has some urinary frequency but attributes this to fluid pills.  Patient denies any modifying or aggravating factors.  Patient denies any recent UTI's, gross hematuria, dysuria or suprapubic/flank pain.  Patient denies any fevers, chills, nausea or vomiting.   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 06/2020 LV gram: EF 35-45%; b. 11/2021 Echo: EF 55%, no rwma, GrI DD, nl RV fxn, mild MR. ao root 42mm.   Cough 07/07/2020   Crohn's disease (HCC)    Elevated troponin 05/18/2020   Heart murmur    Hypertension    Hypokalemia 05/18/2020   Kidney stone    MDD (major  depressive disorder)    NICM (nonischemic cardiomyopathy) (HCC)    a. 06/2020 LV gram: EF 35-45%; b. 11/2021 Echo: EF 55%, GrI DD.   Nonobstructive CAD (coronary artery disease)    a. 10/2011 Stress MV: Equivocal. EF 68%, no ischemia; b. 06/2020 Cath: LM nl, LAD 30m, LCX large, mild diff dzs, RCA mild diff dzs. EF 35-45%; c. 11/2021 ETT: Ex time 3:48. No acute ST/T changes. Max HR 105.   Obesity    OSA (obstructive sleep apnea)    Palpitations 03/31/2020   Pneumonia due to COVID-19 virus 05/11/2020   Premature atrial contractions    a 06/2019 Zio: Predom RSR. Freq PACs (13% burden).   PSVT (paroxysmal supraventricular tachycardia)    Shortness of breath 03/31/2020    Surgical History: Past Surgical History:  Procedure Laterality Date   CARDIAC CATHETERIZATION     COLONOSCOPY N/A 06/14/2021   Procedure: COLONOSCOPY;  Surgeon: Jaynie Collins, DO;  Location: [facility] ENDOSCOPY;  Service: Gastroenterology;  Laterality: N/A;   ESOPHAGOGASTRODUODENOSCOPY N/A 06/14/2021   Procedure: ESOPHAGOGASTRODUODENOSCOPY (EGD);  Surgeon: Jaynie Collins, DO;  Location: [facility] ENDOSCOPY;  Service: Gastroenterology;  Laterality: N/A;   KIDNEY STONE SURGERY     LEFT HEART CATH AND CORONARY ANGIOGRAPHY Left 06/27/2020   Procedure: LEFT HEART CATH AND CORONARY ANGIOGRAPHY;  Surgeon: Yvonne Kendall, MD;  Location: [facility] INVASIVE CV LAB;  Service: Cardiovascular;  Laterality: Left;   VASECTOMY      Home  Medications:  Allergies as of 11/13/2022   No Known Allergies      Medication List        Accurate as of November 13, 2022 11:59 PM. If you have any questions, ask your nurse or doctor.          acetaminophen 325 MG tablet Commonly known as: TYLENOL Take 650-975 mg by mouth every 6 (six) hours as needed for moderate pain or headache (for pain.).   albuterol 108 (90 Base) MCG/ACT inhaler Commonly known as: VENTOLIN HFA Inhale 2 puffs into the lungs every 6 (six) hours as needed for wheezing or  shortness of breath.   amoxicillin-clavulanate 875-125 MG tablet Commonly known as: AUGMENTIN Take 1 tablet by mouth 2 (two) times daily.   Azelastine HCl 137 MCG/SPRAY Soln SPRAY 1 SPRAY INTO EACH NOSTRIL TWICE A DAY   cetirizine 10 MG tablet Commonly known as: ZYRTEC Take 1 tablet (10 mg total) by mouth daily as needed (allergies.).   flecainide 50 MG tablet Commonly known as: TAMBOCOR Take 25 mg by mouth daily.   fluticasone 50 MCG/ACT nasal spray Commonly known as: FLONASE Place 2 sprays into both nostrils daily as needed (allergies.).   furosemide 40 MG tablet Commonly known as: LASIX Take 20 mg by mouth daily.   ipratropium 17 MCG/ACT inhaler Commonly known as: ATROVENT HFA Inhale 2 puffs into the lungs every 6 (six) hours.   losartan 50 MG tablet Commonly known as: COZAAR Take 1 tablet (50 mg total) by mouth daily.   metoprolol succinate 50 MG 24 hr tablet Commonly known as: TOPROL-XL Take 1 tablet (50 mg total) by mouth daily.   metoprolol tartrate 25 MG tablet Commonly known as: LOPRESSOR TAKE 1 TABLET (25 MG) BY MOUTH ONCE DAILY AS NEEDED FOR TACHYCARDIA   montelukast 10 MG tablet Commonly known as: SINGULAIR Take 1 tablet (10 mg total) by mouth at bedtime as needed (allergies.).   omeprazole 40 MG capsule Commonly known as: PRILOSEC Take 1 capsule (40 mg total) by mouth daily.   ondansetron 4 MG tablet Commonly known as: ZOFRAN Take 4 mg by mouth every 8 (eight) hours as needed for nausea/vomiting.   oxyCODONE-acetaminophen 5-325 MG tablet Commonly known as: PERCOCET/ROXICET Take 1 tablet by mouth every 4 (four) hours as needed for severe pain.   pregabalin 100 MG capsule Commonly known as: LYRICA Take 100 mg by mouth 2 (two) times daily.   spironolactone 50 MG tablet Commonly known as: ALDACTONE Take 0.5 tablets (25 mg total) by mouth daily.   tamsulosin 0.4 MG Caps capsule Commonly known as: FLOMAX TAKE 1 CAPSULE BY MOUTH EVERY DAY What  changed: Another medication with the same name was added. Make sure you understand how and when to take each. Changed by: Michiel Cowboy, PA-C   tamsulosin 0.4 MG Caps capsule Commonly known as: FLOMAX Take 1 capsule (0.4 mg total) by mouth daily. What changed: You were already taking a medication with the same name, and this prescription was added. Make sure you understand how and when to take each. Changed by: Michiel Cowboy, PA-C   Vitamin D (Ergocalciferol) 1.25 MG (50000 UNIT) Caps capsule Commonly known as: DRISDOL TAKE 1 CAPSULE BY MOUTH ONCE WEEKLY        Allergies:  No Known Allergies  Family History: Family History  Problem Relation Age of Onset   COPD Mother    Cancer Mother    Hypertension Father    Congestive Heart Failure Father    COPD Father  Lung cancer Father    Heart attack Father 28   Hypertension Sister    Diabetes Paternal Grandfather    Heart attack Paternal Grandfather    Healthy Son    Healthy Daughter     Social History:  reports that he has never smoked. He has never used smokeless tobacco. He reports that he does not currently use alcohol. He reports that he does not use drugs.   Physical Exam: BP (!) 109/52   Pulse 64   Ht 5\' 9"  (1.753 m)   Wt 300 lb (136.1 kg)   BMI 44.30 kg/m   Constitutional:  Well nourished. Alert and oriented, No acute distress. HEENT: [place] AT, moist mucus membranes.  Trachea midline Cardiovascular: No clubbing, cyanosis, or edema. Respiratory: Normal respiratory effort, no increased work of breathing. Neurologic: Grossly intact, no focal deficits, moving all 4 extremities. Psychiatric: Normal mood and affect.   Laboratory Data: Lab Results  Component Value Date   CREATININE 0.95 11/11/2022   Lab Results  Component Value Date   HGBA1C 6.0 (H) 11/11/2022      Results for orders placed or performed in visit on 11/11/22  Lipid panel  Result Value Ref Range   Cholesterol, Total 124 100 - 199 mg/dL    Triglycerides 161 (H) 0 - 149 mg/dL   HDL 31 (L) >09 mg/dL   VLDL Cholesterol Cal 30 5 - 40 mg/dL   LDL Chol Calc (NIH) 63 0 - 99 mg/dL   LDL CALC COMMENT: CANCELED    Chol/HDL Ratio 4.0 0.0 - 5.0 ratio  VITAMIN D 25 Hydroxy (Vit-D Deficiency, Fractures)  Result Value Ref Range   Vit D, 25-Hydroxy 45.8 30.0 - 100.0 ng/mL  CBC With Differential  Result Value Ref Range   WBC 9.0 3.4 - 10.8 x10E3/uL   RBC 5.37 4.14 - 5.80 x10E6/uL   Hemoglobin 16.1 13.0 - 17.7 g/dL   Hematocrit 60.4 54.0 - 51.0 %   MCV 89 79 - 97 fL   MCH 30.0 26.6 - 33.0 pg   MCHC 33.6 31.5 - 35.7 g/dL   RDW 98.1 19.1 - 47.8 %   Neutrophils 51 Not Estab. %   Lymphs 34 Not Estab. %   Monocytes 11 Not Estab. %   Eos 3 Not Estab. %   Basos 1 Not Estab. %   Neutrophils Absolute 4.6 1.4 - 7.0 x10E3/uL   Lymphocytes Absolute 3.1 0.7 - 3.1 x10E3/uL   Monocytes Absolute 1.0 (H) 0.1 - 0.9 x10E3/uL   EOS (ABSOLUTE) 0.3 0.0 - 0.4 x10E3/uL   Basophils Absolute 0.1 0.0 - 0.2 x10E3/uL   Immature Granulocytes 0 Not Estab. %   Immature Grans (Abs) 0.0 0.0 - 0.1 x10E3/uL  CMP14+EGFR  Result Value Ref Range   Glucose 68 (L) 70 - 99 mg/dL   BUN 15 6 - 24 mg/dL   Creatinine, Ser 2.95 0.76 - 1.27 mg/dL   eGFR 94 >62 ZH/YQM/5.78   BUN/Creatinine Ratio 16 9 - 20   Sodium 140 134 - 144 mmol/L   Potassium 4.3 3.5 - 5.2 mmol/L   Chloride 105 96 - 106 mmol/L   CO2 21 20 - 29 mmol/L   Calcium 9.3 8.7 - 10.2 mg/dL   Total Protein 7.1 6.0 - 8.5 g/dL   Albumin 4.1 3.8 - 4.9 g/dL   Globulin, Total 3.0 1.5 - 4.5 g/dL   Albumin/Globulin Ratio 1.4    Bilirubin Total 0.2 0.0 - 1.2 mg/dL   Alkaline Phosphatase 93 44 - 121  IU/L   AST 22 0 - 40 IU/L   ALT 38 0 - 44 IU/L  TSH  Result Value Ref Range   TSH 0.991 0.450 - 4.500 uIU/mL  Hemoglobin A1c  Result Value Ref Range   Hgb A1c MFr Bld 6.0 (H) 4.8 - 5.6 %   Est. average glucose Bld gHb Est-mCnc 126 mg/dL  Vitamin Z61  Result Value Ref Range   Vitamin B-12 >2000 (H) 232 - 1245  pg/mL  POC CREATINE & ALBUMIN,URINE  Result Value Ref Range   Microalbumin Ur, POC 10 mg/L   Creatinine, POC 50 mg/dL   Albumin/Creatinine Ratio, Urine, POC 30-300   I have reviewed the labs.    Pertinent Imaging KUB right renal stone I have independently reviewed the films.  See HPI.  Radiologist interpretation is still pending.  Assessment & Plan:    Nephrolithiasis  -no complaints of renal colic and/or passage of fragments - CT scan in December 2022 noted tiny 3 mm right renal stone  -today's KUB right renal stones   2. Intermediate risk hematuria  - UA negative for micro heme today and no reports of gross hematuria.   Return in about 1 year (around 11/13/2023) for KUB .  [facility] [address], [address] [place], [place] [address] [phone]

## 2022-11-13 ENCOUNTER — Ambulatory Visit (INDEPENDENT_AMBULATORY_CARE_PROVIDER_SITE_OTHER): Payer: Medicare Other | Admitting: Urology

## 2022-11-13 ENCOUNTER — Encounter: Payer: Self-pay | Admitting: Urology

## 2022-11-13 VITALS — BP 109/52 | HR 64 | Ht 69.0 in | Wt 300.0 lb

## 2022-11-13 DIAGNOSIS — N200 Calculus of kidney: Secondary | ICD-10-CM

## 2022-11-13 DIAGNOSIS — N400 Enlarged prostate without lower urinary tract symptoms: Secondary | ICD-10-CM

## 2022-11-13 DIAGNOSIS — R319 Hematuria, unspecified: Secondary | ICD-10-CM

## 2022-11-13 MED ORDER — TAMSULOSIN HCL 0.4 MG PO CAPS
0.4000 mg | ORAL_CAPSULE | Freq: Every day | ORAL | 3 refills | Status: DC
Start: 2022-11-13 — End: 2022-11-18

## 2022-11-16 NOTE — Progress Notes (Signed)
Cardiology Office Note Date:  11/18/2022  Patient ID:  Aaron [name], Aaron [name] 15-Oct-1965, MRN [id number] PCP:  Miki Kins, FNP  Cardiologist:  Yvonne Kendall, MD Electrophysiologist: Lanier Prude, MD    Chief Complaint: SVT, atach follow-up  History of Present Illness: Aaron [name] is a 57 y.o. male with PMH notable for SVT, atach, OSA, obesity; seen today for Lanier Prude, MD for routine electrophysiology followup.  He last saw Dr. Lalla Brothers 05/2022. On flecainide for SVT, uptitration limited by 1st deg AV block. He had better energy level on 25mg  flecainide + metop, palps well-controlled.  Today, he states that his palpitations are mostly well-controlled. He has been having increased gas, and thinks that the increased gas has made the palpitations increase. He can feel a "gas bubble" in his abd, and after a few minutes, belches and the HR will lower. His PCP has started him on a new medicine, and he has not had any "gas bubble" episodes in 2 days, so he his hopeful that it has resolved.   He had a sleep study, but was never fitted/eval'd for CPAP. He is agreeable to further eval with pulm for his OSA.   He has self-lowered his flecainide to 25mg  daily. Takes toprol 50mg  daily  His wife is with him today in clinic who I have met previously.  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 06/2020 LV gram: EF 35-45%; b. 11/2021 Echo: EF 55%, no rwma, GrI DD, nl RV fxn, mild MR. ao root 42mm.   Cough 07/07/2020   Crohn's disease (HCC)    Elevated troponin 05/18/2020   Heart murmur    Hypertension    Hypokalemia 05/18/2020   Kidney stone    MDD (major depressive disorder)    NICM (nonischemic  cardiomyopathy) (HCC)    a. 06/2020 LV gram: EF 35-45%; b. 11/2021 Echo: EF 55%, GrI DD.   Nonobstructive CAD (coronary artery disease)    a. 10/2011 Stress MV: Equivocal. EF 68%, no ischemia; b. 06/2020 Cath: LM nl, LAD 37m, LCX large, mild diff dzs, RCA mild diff dzs. EF 35-45%; c. 11/2021 ETT: Ex time 3:48. No acute ST/T changes. Max HR 105.   Obesity    OSA (obstructive sleep apnea)    Palpitations 03/31/2020   Pneumonia due to COVID-19 virus 05/11/2020   Premature atrial contractions    a 06/2019 Zio: Predom RSR. Freq PACs (13% burden).   PSVT (paroxysmal supraventricular tachycardia)    Shortness of breath 03/31/2020    Past Surgical History:  Procedure Laterality Date   CARDIAC CATHETERIZATION     COLONOSCOPY N/A 06/14/2021   Procedure: COLONOSCOPY;  Surgeon: Jaynie Collins, DO;  Location: [facility] ENDOSCOPY;  Service: Gastroenterology;  Laterality: N/A;   ESOPHAGOGASTRODUODENOSCOPY N/A 06/14/2021   Procedure: ESOPHAGOGASTRODUODENOSCOPY (EGD);  Surgeon: Jaynie Collins, DO;  Location: [facility] ENDOSCOPY;  Service: Gastroenterology;  Laterality: N/A;   KIDNEY STONE SURGERY     LEFT HEART CATH AND CORONARY ANGIOGRAPHY Left 06/27/2020   Procedure: LEFT HEART CATH AND CORONARY ANGIOGRAPHY;  Surgeon: Yvonne Kendall, MD;  Location: [facility] INVASIVE CV LAB;  Service: Cardiovascular;  Laterality: Left;   VASECTOMY  Current Outpatient Medications  Medication Instructions   acetaminophen (TYLENOL) 650-975 mg, Oral, Every 6 hours PRN   albuterol (VENTOLIN HFA) 108 (90 Base) MCG/ACT inhaler 2 puffs, Inhalation, Every 6 hours PRN   amoxicillin-clavulanate (AUGMENTIN) 875-125 MG tablet 1 tablet, Oral, 2 times daily   Azelastine HCl 137 MCG/SPRAY SOLN SPRAY 1 SPRAY INTO EACH NOSTRIL TWICE A DAY   cetirizine (ZYRTEC) 10 mg, Oral, Daily PRN   flecainide (TAMBOCOR) 25 mg, Oral, Daily   fluticasone (FLONASE) 50 MCG/ACT nasal spray 2 sprays, Each Nare, Daily PRN   furosemide (LASIX) 20 mg,  Oral, Daily   ipratropium (ATROVENT HFA) 17 MCG/ACT inhaler 2 puffs, Inhalation, Every 6 hours   losartan (COZAAR) 50 mg, Oral, Daily   metoprolol succinate (TOPROL-XL) 50 mg, Oral, Daily   metoprolol tartrate (LOPRESSOR) 25 MG tablet TAKE 1 TABLET (25 MG) BY MOUTH ONCE DAILY AS NEEDED FOR TACHYCARDIA   montelukast (SINGULAIR) 10 mg, Oral, At bedtime PRN   omeprazole (PRILOSEC) 40 mg, Oral, Daily   ondansetron (ZOFRAN) 4 mg, Oral, Every 8 hours PRN   oxyCODONE-acetaminophen (PERCOCET/ROXICET) 5-325 MG tablet 1 tablet, Oral, Every 4 hours PRN   pregabalin (LYRICA) 100 mg, Oral, 2 times daily   spironolactone (ALDACTONE) 25 mg, Oral, Daily   tamsulosin (FLOMAX) 0.4 mg, Oral, Daily   Vitamin D (Ergocalciferol) (DRISDOL) 50,000 Units, Oral, Weekly    Social History:  The patient  reports that he has never smoked. He has never used smokeless tobacco. He reports that he does not currently use alcohol. He reports that he does not use drugs.   Family History:  The patient's family history includes COPD in his father and mother; Cancer in his mother; Congestive Heart Failure in his father; Diabetes in his paternal grandfather; Healthy in his daughter and son; Heart attack in his paternal grandfather; Heart attack (age of onset: 32) in his father; Hypertension in his father and sister; Lung cancer in his father.  ROS:  Please see the history of present illness. All other systems are reviewed and otherwise negative.   PHYSICAL EXAM:  VS:  BP 100/80 (BP Location: Left Arm, Patient Position: Sitting, Cuff Size: Large)   Pulse 80   Ht 5\' 10"  (1.778 m)   Wt (!) 310 lb (140.6 kg)   SpO2 98%   BMI 44.48 kg/m  BMI: Body mass index is 44.48 kg/m.  GEN- The patient is well appearing, alert and oriented x 3 today.   Lungs- Clear to ausculation bilaterally, normal work of breathing.  Heart- Regular rate and rhythm, no murmurs, rubs or gallops Extremities- Trace peripheral edema, warm, dry   EKG is  ordered. Personal review of EKG from today shows:  NSR, rate 65bpm 1st deg AV block, PR 280 - stable from prior  05/22/2022 EKG SR w PACs, 1st deg AV block, 64bpm PR -   Recent Labs: 01/05/2022: Magnesium 2.2; Platelets 301 11/11/2022: ALT 38; BUN 15; Creatinine, Ser 0.95; Hemoglobin 16.1; Potassium 4.3; Sodium 140; TSH 0.991  11/11/2022: Chol/HDL Ratio 4.0; Cholesterol, Total 124; HDL 31; LDL Chol Calc (NIH) 63; Triglycerides 180   Estimated Creatinine Clearance: 122.8 mL/min (by C-G formula based on SCr of 0.95 mg/dL).   Wt Readings from Last 3 Encounters:  11/18/22 (!) 310 lb (140.6 kg)  11/13/22 300 lb (136.1 kg)  11/11/22 (!) 307 lb 12.8 oz (139.6 kg)     Additional studies reviewed include: Previous EP, cardiology notes.   ETT, 11/09/2021 A Bruce protocol stress test was  performed. Exercise capacity was mildly impaired. Patient exercised for 3 min and 48 sec. Maximum HR of 105 bpm. MPHR 63.0 %. Peak METS 5.4 . The patient experienced no angina during the test. The test was stopped because the patient experienced fatigue and leg pain. The patient reported leg pain and fatigue during the stress test. Symptoms began during stress. Normal blood pressure and normal heart rate response noted during stress. Heart rate recovery was normal.   TTE, 11/11/2021  1. Left ventricular ejection fraction, by estimation, is 55%. The left ventricle has normal function. The left ventricle has no regional wall motion abnormalities. There is mild left ventricular hypertrophy. Left ventricular diastolic parameters are consistent with Grade I diastolic dysfunction (impaired relaxation).   2. Right ventricular systolic function is normal. The right ventricular size is normal.   3. The mitral valve is normal in structure. Mild mitral valve regurgitation. No evidence of mitral stenosis.   4. The aortic valve is normal in structure. Aortic valve regurgitation is not visualized. No aortic stenosis is  present.   5. There is mild dilatation of the aortic root, measuring 42 mm.   6. The inferior vena cava is normal in size with greater than 50% respiratory variability, suggesting right atrial pressure of 3 mmHg.   ASSESSMENT AND PLAN:  #) parox SVT #) PAC #) 1st deg AV block EKG with stable intervals on 25mg  flecainide daily We discussed increasing flecainide to 25mg  BID, patient prefers to keep at current dose at this time. Symptoms have been well-controlled until recent GI "gas bubble" episodes. Seems to be improved now with GI medication adjustments.  Cont to monitor   #) Sleep disordered breathing Has sleep study in the past, but unsure where he is in further evaluation for CPAP Will refer to pulm for further eval and treatment   Current medicines are reviewed at length with the patient today.   The patient does not have concerns regarding his medicines.  The following changes were made today:  none  Labs/ tests ordered today include:  Orders Placed This Encounter  Procedures   Ambulatory referral to Pulmonology   EKG 12-Lead     Disposition: Follow up with Dr. Lalla Brothers or CPAP in 6 months   Signed, Sherie Don, NP  11/18/22  2:19 PM  Electrophysiology [facility]

## 2022-11-18 ENCOUNTER — Encounter: Payer: Self-pay | Admitting: Cardiology

## 2022-11-18 ENCOUNTER — Ambulatory Visit: Payer: Medicare Other | Attending: Cardiology | Admitting: Cardiology

## 2022-11-18 VITALS — BP 100/80 | HR 80 | Ht 70.0 in | Wt 310.0 lb

## 2022-11-18 DIAGNOSIS — G4730 Sleep apnea, unspecified: Secondary | ICD-10-CM | POA: Diagnosis not present

## 2022-11-18 DIAGNOSIS — I471 Supraventricular tachycardia, unspecified: Secondary | ICD-10-CM | POA: Diagnosis not present

## 2022-11-18 DIAGNOSIS — I440 Atrioventricular block, first degree: Secondary | ICD-10-CM | POA: Diagnosis not present

## 2022-11-18 DIAGNOSIS — I491 Atrial premature depolarization: Secondary | ICD-10-CM | POA: Diagnosis not present

## 2022-11-18 NOTE — Patient Instructions (Signed)
Medication Instructions:  Your physician recommends that you continue on your current medications as directed. Please refer to the Current Medication list given to you today.  *If you need a refill on your cardiac medications before your next appointment, please call your pharmacy*   Lab Work: No labs ordered  If you have labs (blood work) drawn today and your tests are completely normal, you will receive your results only by: MyChart Message (if you have MyChart) OR A paper copy in the mail If you have any lab test that is abnormal or we need to change your treatment, we will call you to review the results.   Testing/Procedures: No testing ordered  Follow-Up: At [facility], you and your health needs are our priority.  As part of our continuing mission to provide you with exceptional heart care, we have created designated Provider Care Teams.  These Care Teams include your primary Cardiologist (physician) and Advanced Practice Providers (APPs -  Physician Assistants and Nurse Practitioners) who all work together to provide you with the care you need, when you need it.  We recommend signing up for the patient portal called "MyChart".  Sign up information is provided on this After Visit Summary.  MyChart is used to connect with patients for Virtual Visits (Telemedicine).  Patients are able to view lab/test results, encounter notes, upcoming appointments, etc.  Non-urgent messages can be sent to your provider as well.   To learn more about what you can do with MyChart, go to [email or web address].    Your next appointment:   6 month(s)  Provider:   Steffanie Dunn, MD or Sherie Don, NP    Other Instructions You are being referred to the Pulmonology department for a sleep evaluation.

## 2022-12-12 ENCOUNTER — Ambulatory Visit: Payer: Medicare Other | Admitting: Family

## 2022-12-13 ENCOUNTER — Other Ambulatory Visit: Payer: Self-pay | Admitting: Nurse Practitioner

## 2022-12-17 ENCOUNTER — Ambulatory Visit (INDEPENDENT_AMBULATORY_CARE_PROVIDER_SITE_OTHER): Payer: Medicare Other | Admitting: Family

## 2022-12-17 VITALS — BP 110/80 | HR 61 | Ht 69.0 in | Wt 310.6 lb

## 2022-12-17 DIAGNOSIS — E782 Mixed hyperlipidemia: Secondary | ICD-10-CM

## 2022-12-17 DIAGNOSIS — E1165 Type 2 diabetes mellitus with hyperglycemia: Secondary | ICD-10-CM

## 2022-12-17 DIAGNOSIS — E1169 Type 2 diabetes mellitus with other specified complication: Secondary | ICD-10-CM

## 2022-12-17 DIAGNOSIS — I5032 Chronic diastolic (congestive) heart failure: Secondary | ICD-10-CM

## 2022-12-17 DIAGNOSIS — E538 Deficiency of other specified B group vitamins: Secondary | ICD-10-CM | POA: Diagnosis not present

## 2022-12-17 DIAGNOSIS — Z6841 Body Mass Index (BMI) 40.0 and over, adult: Secondary | ICD-10-CM

## 2022-12-17 MED ORDER — CYANOCOBALAMIN 1000 MCG/ML IJ SOLN
1000.0000 ug | Freq: Once | INTRAMUSCULAR | Status: AC
Start: 2022-12-17 — End: 2022-12-17
  Administered 2022-12-17: 1000 ug via INTRAMUSCULAR

## 2022-12-17 NOTE — Progress Notes (Signed)
Established Patient Office Visit  Subjective:  Patient ID: Aaron [name], male    DOB: 1965-11-11  Age: 57 y.o. MRN: [id number]  Chief Complaint  Patient presents with   B12 Injection    Patient is here today for his 1 month follow up.  He has been feeling fairly well since last appointment.   He does not have additional concerns to discuss today.  Labs are not due today, were done at last appointment.  He needs refills.   I have reviewed his active problem list, medication list, allergies, notes from last encounter, lab results for his appointment today.    No other concerns at this time.   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 06/2020 LV gram: EF 35-45%; b. 11/2021 Echo: EF 55%, no rwma, GrI DD, nl RV fxn, mild MR. ao root 42mm.   Cough 07/07/2020   Crohn's disease (HCC)    Elevated troponin 05/18/2020   Heart murmur    Hypertension    Hypokalemia 05/18/2020   Kidney stone    MDD (major depressive disorder)    NICM (nonischemic cardiomyopathy) (HCC)    a. 06/2020 LV gram: EF 35-45%; b. 11/2021 Echo: EF 55%, GrI DD.   Nonobstructive CAD (coronary artery disease)    a. 10/2011 Stress MV: Equivocal. EF 68%, no ischemia; b. 06/2020 Cath: LM nl, LAD 10m, LCX large, mild diff dzs, RCA mild diff dzs. EF 35-45%; c. 11/2021 ETT: Ex time 3:48. No acute ST/T changes. Max HR 105.   Obesity    OSA (obstructive sleep apnea)    Palpitations 03/31/2020   Pneumonia due to COVID-19 virus 05/11/2020   Premature atrial contractions    a 06/2019 Zio: Predom RSR. Freq PACs (13% burden).   PSVT (paroxysmal supraventricular tachycardia)    Shortness of breath 03/31/2020    Past Surgical History:  Procedure Laterality Date   CARDIAC  CATHETERIZATION     COLONOSCOPY N/A 06/14/2021   Procedure: COLONOSCOPY;  Surgeon: Jaynie Collins, DO;  Location: [facility] ENDOSCOPY;  Service: Gastroenterology;  Laterality: N/A;   ESOPHAGOGASTRODUODENOSCOPY N/A 06/14/2021   Procedure: ESOPHAGOGASTRODUODENOSCOPY (EGD);  Surgeon: Jaynie Collins, DO;  Location: [facility] ENDOSCOPY;  Service: Gastroenterology;  Laterality: N/A;   KIDNEY STONE SURGERY     LEFT HEART CATH AND CORONARY ANGIOGRAPHY Left 06/27/2020   Procedure: LEFT HEART CATH AND CORONARY ANGIOGRAPHY;  Surgeon: Yvonne Kendall, MD;  Location: [facility] INVASIVE CV LAB;  Service: Cardiovascular;  Laterality: Left;   VASECTOMY      Social History   Socioeconomic History   Marital status: Widowed    Spouse name: carisa   Number of children: 2   Years of education: Not on file   Highest education level: High school graduate  Occupational History   Not on file  Tobacco Use   Smoking status: Never   Smokeless tobacco: Never  Vaping Use   Vaping status: Never Used  Substance and Sexual Activity   Alcohol use: Not Currently   Drug use: Never   Sexual activity: Not Currently  Other Topics Concern   Not on file  Social History Narrative   Not on file   Social Determinants of Health   Financial  Resource Strain: Low Risk  (09/02/2018)   Overall Financial Resource Strain (CARDIA)    Difficulty of Paying Living Expenses: Not hard at all  Food Insecurity: No Food Insecurity (09/02/2018)   Hunger Vital Sign    Worried About Running Out of Food in the Last Year: Never true    Ran Out of Food in the Last Year: Never true  Transportation Needs: No Transportation Needs (09/02/2018)   PRAPARE - Administrator, Civil Service (Medical): No    Lack of Transportation (Non-Medical): No  Physical Activity: Insufficiently Active (09/02/2018)   Exercise Vital Sign    Days of Exercise per Week: 2 days    Minutes of Exercise per Session: 30 min  Stress: Stress Concern Present  (09/02/2018)   [employer] of Occupational Health - Occupational Stress Questionnaire    Feeling of Stress : Very much  Social Connections: Unknown (09/02/2018)   Social Connection and Isolation Panel [NHANES]    Frequency of Communication with Friends and Family: Not on file    Frequency of Social Gatherings with Friends and Family: Not on file    Attends Religious Services: Never    Active Member of Clubs or Organizations: No    Attends Banker Meetings: Never    Marital Status: Married  Catering manager Violence: Not At Risk (09/02/2018)   Humiliation, Afraid, Rape, and Kick questionnaire    Fear of Current or Ex-Partner: No    Emotionally Abused: No    Physically Abused: No    Sexually Abused: No    Family History  Problem Relation Age of Onset   COPD Mother    Cancer Mother    Hypertension Father    Congestive Heart Failure Father    COPD Father    Lung cancer Father    Heart attack Father 5   Hypertension Sister    Diabetes Paternal Grandfather    Heart attack Paternal Grandfather    Healthy Son    Healthy Daughter     No Known Allergies  Review of Systems  All other systems reviewed and are negative.      Objective:   BP 110/80   Pulse 61   Ht 5\' 9"  (1.753 m)   Wt (!) 310 lb 9.6 oz (140.9 kg)   SpO2 95%   BMI 45.87 kg/m   Vitals:   12/17/22 1344  BP: 110/80  Pulse: 61  Height: 5\' 9"  (1.753 m)  Weight: (!) 310 lb 9.6 oz (140.9 kg)  SpO2: 95%  BMI (Calculated): 45.85    Physical Exam Vitals and nursing note reviewed.  Constitutional:      Appearance: Normal appearance. He is normal weight.  Eyes:     Extraocular Movements: Extraocular movements intact.     Conjunctiva/sclera: Conjunctivae normal.     Pupils: Pupils are equal, round, and reactive to light.  Cardiovascular:     Rate and Rhythm: Normal rate and regular rhythm.     Pulses: Normal pulses.     Heart sounds: Normal heart sounds.  Pulmonary:     Effort: Pulmonary  effort is normal.     Breath sounds: Normal breath sounds.  Musculoskeletal:        General: Normal range of motion.  Neurological:     General: No focal deficit present.     Mental Status: He is alert and oriented to person, place, and time. Mental status is at baseline.  Psychiatric:        Mood and  Affect: Mood normal.        Behavior: Behavior normal.        Thought Content: Thought content normal.        Judgment: Judgment normal.      No results found for any visits on 12/17/22.  Recent Results (from the past 2160 hour(s))  POC CREATINE & ALBUMIN,URINE     Status: Abnormal   Collection Time: 11/11/22  2:08 PM  Result Value Ref Range   Microalbumin Ur, POC 10 mg/L   Creatinine, POC 50 mg/dL   Albumin/Creatinine Ratio, Urine, POC 30-300   Lipid panel     Status: Abnormal   Collection Time: 11/11/22  2:15 PM  Result Value Ref Range   Cholesterol, Total 124 100 - 199 mg/dL   Triglycerides 161 (H) 0 - 149 mg/dL   HDL 31 (L) >09 mg/dL   VLDL Cholesterol Cal 30 5 - 40 mg/dL   LDL Chol Calc (NIH) 63 0 - 99 mg/dL   LDL CALC COMMENT: CANCELED     Comment: Test not performed  Result canceled by the ancillary.    Chol/HDL Ratio 4.0 0.0 - 5.0 ratio    Comment:                                   T. Chol/HDL Ratio                                             Men  Women                               1/2 Avg.Risk  3.4    3.3                                   Avg.Risk  5.0    4.4                                2X Avg.Risk  9.6    7.1                                3X Avg.Risk 23.4   11.0   VITAMIN D 25 Hydroxy (Vit-D Deficiency, Fractures)     Status: None   Collection Time: 11/11/22  2:15 PM  Result Value Ref Range   Vit D, 25-Hydroxy 45.8 30.0 - 100.0 ng/mL    Comment: Vitamin D deficiency has been defined by the Institute of Medicine and an Endocrine Society practice guideline as a level of serum 25-OH vitamin D less than 20 ng/mL (1,2). The Endocrine Society went on to  further define vitamin D insufficiency as a level between 21 and 29 ng/mL (2). 1. IOM (Institute of Medicine). 2010. Dietary reference    intakes for calcium and D. [place]: The    [employer]. 2. Holick MF, Binkley Pinedale, Bischoff-Ferrari HA, et al.    Evaluation, treatment, and prevention of vitamin D    deficiency: an Endocrine Society clinical practice    guideline. JCEM. 2011 Jul; 96(7):1911-30.   CBC With  Differential     Status: Abnormal   Collection Time: 11/11/22  2:15 PM  Result Value Ref Range   WBC 9.0 3.4 - 10.8 x10E3/uL   RBC 5.37 4.14 - 5.80 x10E6/uL   Hemoglobin 16.1 13.0 - 17.7 g/dL   Hematocrit 40.9 81.1 - 51.0 %   MCV 89 79 - 97 fL   MCH 30.0 26.6 - 33.0 pg   MCHC 33.6 31.5 - 35.7 g/dL   RDW 91.4 78.2 - 95.6 %   Neutrophils 51 Not Estab. %   Lymphs 34 Not Estab. %   Monocytes 11 Not Estab. %   Eos 3 Not Estab. %   Basos 1 Not Estab. %   Neutrophils Absolute 4.6 1.4 - 7.0 x10E3/uL   Lymphocytes Absolute 3.1 0.7 - 3.1 x10E3/uL   Monocytes Absolute 1.0 (H) 0.1 - 0.9 x10E3/uL   EOS (ABSOLUTE) 0.3 0.0 - 0.4 x10E3/uL   Basophils Absolute 0.1 0.0 - 0.2 x10E3/uL   Immature Granulocytes 0 Not Estab. %   Immature Grans (Abs) 0.0 0.0 - 0.1 x10E3/uL    Comment: **Effective December 30, 2022, profile [id number] CBC/Differential**   (No Platelet) will be made non-orderable. [facility] Offers:   [id number] CBC With Differential/Platelet   CMP14+EGFR     Status: Abnormal   Collection Time: 11/11/22  2:15 PM  Result Value Ref Range   Glucose 68 (L) 70 - 99 mg/dL   BUN 15 6 - 24 mg/dL   Creatinine, Ser 5.78 0.76 - 1.27 mg/dL   eGFR 94 >46 NG/EXB/2.84   BUN/Creatinine Ratio 16 9 - 20   Sodium 140 134 - 144 mmol/L   Potassium 4.3 3.5 - 5.2 mmol/L   Chloride 105 96 - 106 mmol/L   CO2 21 20 - 29 mmol/L   Calcium 9.3 8.7 - 10.2 mg/dL   Total Protein 7.1 6.0 - 8.5 g/dL   Albumin 4.1 3.8 - 4.9 g/dL   Globulin, Total 3.0 1.5 - 4.5 g/dL   Albumin/Globulin Ratio 1.4     Bilirubin Total 0.2 0.0 - 1.2 mg/dL   Alkaline Phosphatase 93 44 - 121 IU/L   AST 22 0 - 40 IU/L   ALT 38 0 - 44 IU/L  TSH     Status: None   Collection Time: 11/11/22  2:15 PM  Result Value Ref Range   TSH 0.991 0.450 - 4.500 uIU/mL  Hemoglobin A1c     Status: Abnormal   Collection Time: 11/11/22  2:15 PM  Result Value Ref Range   Hgb A1c MFr Bld 6.0 (H) 4.8 - 5.6 %    Comment:          Prediabetes: 5.7 - 6.4          Diabetes: >6.4          Glycemic control for adults with diabetes: <7.0    Est. average glucose Bld gHb Est-mCnc 126 mg/dL  Vitamin X32     Status: Abnormal   Collection Time: 11/11/22  2:15 PM  Result Value Ref Range   Vitamin B-12 >2000 (H) 232 - 1245 pg/mL       Assessment & Plan:   Problem List Items Addressed This Visit       Active Problems   Morbid obesity with BMI of 45.0-49.9, adult (HCC)    Continue current meds.  Will adjust as needed based on results.  The patient is asked to make an attempt to improve diet and exercise patterns to aid in medical management of this  problem. Addressed importance of increasing and maintaining water intake.        Chronic diastolic CHF (congestive heart failure) (HCC) (Chronic)    Patient is seen by Cardiology , who manage this condition.  He is well controlled with current therapy.   Will defer to them for further changes to plan of care.       Mixed hyperlipidemia    Checking labs today.  Continue current therapy for lipid control. Will modify as needed based on labwork results.        Type 2 diabetes mellitus with hyperglycemia (HCC)    Checking labs today. Will call pt. With results  Continue current diabetes POC, as patient has been well controlled on current regimen.  Will adjust meds if needed based on labs.        Vitamin B12 deficiency - Primary    B12 Injection given in office today.  Repeat in 1 month.         Return in about 1 month (around 01/17/2023) for F/U.   Total time  spent: 30 minutes  Miki Kins, FNP  12/17/2022   This document may have been prepared by [facility] Voice Recognition software and as such may include unintentional dictation errors.

## 2022-12-22 ENCOUNTER — Encounter: Payer: Self-pay | Admitting: Family

## 2022-12-22 DIAGNOSIS — E538 Deficiency of other specified B group vitamins: Secondary | ICD-10-CM | POA: Insufficient documentation

## 2022-12-22 NOTE — Assessment & Plan Note (Signed)
Patient is seen by Cardiology, who manage this condition.  He is well controlled with current therapy.   Will defer to them for further changes to plan of care.  

## 2022-12-22 NOTE — Assessment & Plan Note (Signed)
Continue current meds.  Will adjust as needed based on results.  The patient is asked to make an attempt to improve diet and exercise patterns to aid in medical management of this problem. Addressed importance of increasing and maintaining water intake.   

## 2022-12-22 NOTE — Assessment & Plan Note (Signed)
Checking labs today.  Continue current therapy for lipid control. Will modify as needed based on labwork results.  

## 2022-12-22 NOTE — Assessment & Plan Note (Signed)
Checking labs today. Will call pt. With results  Continue current diabetes POC, as patient has been well controlled on current regimen.  Will adjust meds if needed based on labs.  

## 2022-12-22 NOTE — Assessment & Plan Note (Signed)
B12 Injection given in office today.  Repeat in 1 month.

## 2023-01-01 ENCOUNTER — Ambulatory Visit: Payer: Medicare Other | Admitting: Nurse Practitioner

## 2023-01-07 ENCOUNTER — Telehealth: Payer: Self-pay

## 2023-01-07 NOTE — Telephone Encounter (Signed)
Patient health care provider statement is ready for pick up, copy has been made

## 2023-01-07 NOTE — Telephone Encounter (Signed)
Patient Health Care Provider statement is ready for pick up, I have also made a copy of this form.

## 2023-01-20 ENCOUNTER — Ambulatory Visit (INDEPENDENT_AMBULATORY_CARE_PROVIDER_SITE_OTHER): Payer: Medicare Other | Admitting: Family

## 2023-01-20 VITALS — BP 110/89 | HR 80 | Ht 69.0 in | Wt 305.0 lb

## 2023-01-20 DIAGNOSIS — E1165 Type 2 diabetes mellitus with hyperglycemia: Secondary | ICD-10-CM

## 2023-01-20 DIAGNOSIS — I10 Essential (primary) hypertension: Secondary | ICD-10-CM

## 2023-01-20 DIAGNOSIS — E782 Mixed hyperlipidemia: Secondary | ICD-10-CM

## 2023-01-20 DIAGNOSIS — E538 Deficiency of other specified B group vitamins: Secondary | ICD-10-CM

## 2023-01-20 DIAGNOSIS — Z6841 Body Mass Index (BMI) 40.0 and over, adult: Secondary | ICD-10-CM

## 2023-01-20 MED ORDER — CYANOCOBALAMIN 1000 MCG/ML IJ SOLN
1000.0000 ug | Freq: Once | INTRAMUSCULAR | Status: AC
Start: 2023-01-20 — End: 2023-01-20
  Administered 2023-01-20: 1000 ug via INTRAMUSCULAR

## 2023-01-20 MED ORDER — DIPHENOXYLATE-ATROPINE 2.5-0.025 MG PO TABS: 1.0000 | ORAL_TABLET | Freq: Four times a day (QID) | ORAL | 1 refills | Status: DC | PRN

## 2023-01-22 ENCOUNTER — Other Ambulatory Visit: Payer: Self-pay | Admitting: Family

## 2023-01-22 ENCOUNTER — Telehealth: Payer: Self-pay | Admitting: Family

## 2023-01-22 NOTE — Telephone Encounter (Signed)
Patient left VM that he thinks he may have a possible kidney infection from where he just passed a kidney stone. He is requesting we send him in an antibiotic. Please advise.

## 2023-01-23 ENCOUNTER — Encounter: Payer: Self-pay | Admitting: Physician Assistant

## 2023-01-23 ENCOUNTER — Ambulatory Visit
Admission: RE | Admit: 2023-01-23 | Discharge: 2023-01-23 | Disposition: A | Discharge status: Home / Self Care | Payer: Medicare Other | Attending: Urology | Admitting: Urology

## 2023-01-23 ENCOUNTER — Ambulatory Visit
Admission: RE | Admit: 2023-01-23 | Discharge: 2023-01-23 | Disposition: A | Discharge status: Home / Self Care | Payer: Medicare Other | Source: Ambulatory Visit | Attending: Urology | Admitting: Urology

## 2023-01-23 ENCOUNTER — Ambulatory Visit (INDEPENDENT_AMBULATORY_CARE_PROVIDER_SITE_OTHER): Payer: Medicare Other | Admitting: Physician Assistant

## 2023-01-23 VITALS — BP 124/80 | HR 62 | Wt 306.0 lb

## 2023-01-23 DIAGNOSIS — N200 Calculus of kidney: Secondary | ICD-10-CM | POA: Diagnosis present

## 2023-01-23 DIAGNOSIS — N201 Calculus of ureter: Secondary | ICD-10-CM

## 2023-01-23 LAB — URINALYSIS, COMPLETE
Bilirubin, UA: NEGATIVE
Glucose, UA: NEGATIVE
Ketones, UA: NEGATIVE
Leukocytes,UA: NEGATIVE
Nitrite, UA: NEGATIVE
Protein,UA: NEGATIVE
Specific Gravity, UA: 1.015 (ref 1.005–1.030)
Urobilinogen, Ur: 0.2 mg/dL (ref 0.2–1.0)
pH, UA: 5 (ref 5.0–7.5)

## 2023-01-23 LAB — MICROSCOPIC EXAMINATION: Bacteria, UA: NONE SEEN

## 2023-01-23 MED ORDER — ONDANSETRON 4 MG PO TBDP
4.0000 mg | ORAL_TABLET | Freq: Three times a day (TID) | ORAL | 0 refills | Status: DC | PRN
Start: 2023-01-23 — End: 2023-05-15

## 2023-01-23 NOTE — Patient Instructions (Addendum)
For the next 4 weeks, please do the following: -Increase Flomax to twice daily -Stay well hydrated -Strain your urine to catch any stones that pass -Treat any pain with ibuprofen/tylenol or prescription pain medication -Treat any nausea with Zofran as prescribed today  I will plan to see you back in clinic in 3 weeks with another x-ray prior to see if you have passed your stone.  Please call our office immediately (we are open 8a-5p Monday-Friday) or go to the Emergency Department if you develop any of the following: -Fever/chills -Nausea and/or vomiting uncontrollable with Zofran -Pain uncontrollable with prescription pain medication

## 2023-01-23 NOTE — Progress Notes (Signed)
01/23/2023 3:46 PM   Aaron [name] [birth year] [id number]  CC: Chief Complaint  Patient presents with   Nephrolithiasis   HPI: Aaron [name] is a 57 y.o. male with PMH for lithiasis and chronic back pain under pain contract with another provider who presents today for evaluation of possible acute stone episode versus infection.  He is accompanied today by his wife, who contributes to HPI.  Today he reports a 3-day history of right flank pain, dysuria, gross hematuria, urgency, frequency, and obstructive voiding symptoms.  He denies fever, chills, nausea, or vomiting.  He has been taking his prescribed pain meds, which are helping.  He also took 3 doses of Augmentin.  He has been able to spontaneously pass stones in the past, but it sounds like he has had a basket extraction previously and did not tolerate stent particularly well.  He is not very excited about pursuing definitive stone management.  KUB today with interval migration of his 4 mm right renal stone to the distal right ureter, anticipate at the right UVJ.  In-office UA today positive for 2+ blood; urine microscopy with 3-10 RBCs/HPF.  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 06/2020 LV gram: EF 35-45%; b. 11/2021 Echo: EF 55%, no rwma, GrI DD, nl RV fxn, mild MR. ao root 42mm.   Cough 07/07/2020   Crohn's disease (HCC)    Elevated troponin 05/18/2020   Heart murmur    Hypertension    Hypokalemia 05/18/2020   Kidney stone    MDD (major depressive disorder)    NICM (nonischemic cardiomyopathy) (HCC)    a. 06/2020 LV gram: EF 35-45%; b. 11/2021 Echo: EF 55%, GrI DD.   Nonobstructive CAD (coronary artery disease)    a. 10/2011 Stress MV: Equivocal. EF 68%, no  ischemia; b. 06/2020 Cath: LM nl, LAD 50m, LCX large, mild diff dzs, RCA mild diff dzs. EF 35-45%; c. 11/2021 ETT: Ex time 3:48. No acute ST/T changes. Max HR 105.   Obesity    OSA (obstructive sleep apnea)    Palpitations 03/31/2020   Pneumonia due to COVID-19 virus 05/11/2020   Premature atrial contractions    a 06/2019 Zio: Predom RSR. Freq PACs (13% burden).   PSVT (paroxysmal supraventricular tachycardia)    Shortness of breath 03/31/2020    Surgical History: Past Surgical History:  Procedure Laterality Date   CARDIAC CATHETERIZATION     COLONOSCOPY N/A 06/14/2021   Procedure: COLONOSCOPY;  Surgeon: Jaynie Collins, DO;  Location: [facility] ENDOSCOPY;  Service: Gastroenterology;  Laterality: N/A;   ESOPHAGOGASTRODUODENOSCOPY N/A 06/14/2021   Procedure: ESOPHAGOGASTRODUODENOSCOPY (EGD);  Surgeon: Jaynie Collins, DO;  Location: [facility] ENDOSCOPY;  Service: Gastroenterology;  Laterality: N/A;   KIDNEY STONE SURGERY     LEFT HEART CATH AND CORONARY ANGIOGRAPHY Left 06/27/2020   Procedure: LEFT HEART CATH AND CORONARY ANGIOGRAPHY;  Surgeon: Yvonne Kendall, MD;  Location: [facility] INVASIVE CV LAB;  Service: Cardiovascular;  Laterality: Left;   VASECTOMY      Home Medications:  Allergies as of 01/23/2023   No Known Allergies      Medication List        Accurate as of January 23, 2023  3:46 PM. If  you have any questions, ask your nurse or doctor.          acetaminophen 325 MG tablet Commonly known as: TYLENOL Take 650-975 mg by mouth every 6 (six) hours as needed for moderate pain or headache (for pain.).   albuterol 108 (90 Base) MCG/ACT inhaler Commonly known as: VENTOLIN HFA Inhale 2 puffs into the lungs every 6 (six) hours as needed for wheezing or shortness of breath.   amoxicillin-clavulanate 875-125 MG tablet Commonly known as: AUGMENTIN Take 1 tablet by mouth 2 (two) times daily.   Azelastine HCl 137 MCG/SPRAY Soln SPRAY 1 SPRAY INTO EACH NOSTRIL TWICE A DAY    cetirizine 10 MG tablet Commonly known as: ZYRTEC Take 1 tablet (10 mg total) by mouth daily as needed (allergies.).   diphenoxylate-atropine 2.5-0.025 MG tablet Commonly known as: Lomotil Take 1 tablet by mouth 4 (four) times daily as needed for diarrhea or loose stools.   flecainide 50 MG tablet Commonly known as: TAMBOCOR Take 25 mg by mouth daily.   fluticasone 50 MCG/ACT nasal spray Commonly known as: FLONASE Place 2 sprays into both nostrils daily as needed (allergies.).   furosemide 40 MG tablet Commonly known as: LASIX Take 20 mg by mouth daily.   ipratropium 17 MCG/ACT inhaler Commonly known as: ATROVENT HFA Inhale 2 puffs into the lungs every 6 (six) hours.   losartan 50 MG tablet Commonly known as: COZAAR Take 1 tablet (50 mg total) by mouth daily.   metoprolol succinate 50 MG 24 hr tablet Commonly known as: TOPROL-XL Take 1 tablet (50 mg total) by mouth daily.   metoprolol tartrate 25 MG tablet Commonly known as: LOPRESSOR Take 1 tablet (25 mg) by mouth once daily as needed for tachycardia/PLEASE CALL OFFICE TO SCHEDULE APPOINTMENT PRIOR TO NEXT REFILL   montelukast 10 MG tablet Commonly known as: SINGULAIR Take 1 tablet (10 mg total) by mouth at bedtime as needed (allergies.).   omeprazole 40 MG capsule Commonly known as: PRILOSEC Take 1 capsule (40 mg total) by mouth daily.   ondansetron 4 MG disintegrating tablet Commonly known as: ZOFRAN-ODT Take 1 tablet (4 mg total) by mouth every 8 (eight) hours as needed for nausea or vomiting. Started by: Carman Ching   ondansetron 4 MG tablet Commonly known as: ZOFRAN Take 4 mg by mouth every 8 (eight) hours as needed for nausea/vomiting.   oxyCODONE-acetaminophen 5-325 MG tablet Commonly known as: PERCOCET/ROXICET Take 1 tablet by mouth every 4 (four) hours as needed for severe pain.   pregabalin 100 MG capsule Commonly known as: LYRICA Take 100 mg by mouth 2 (two) times daily.    spironolactone 50 MG tablet Commonly known as: ALDACTONE Take 0.5 tablets (25 mg total) by mouth daily.   tamsulosin 0.4 MG Caps capsule Commonly known as: FLOMAX TAKE 1 CAPSULE BY MOUTH EVERY DAY   Vitamin D (Ergocalciferol) 1.25 MG (50000 UNIT) Caps capsule Commonly known as: DRISDOL TAKE 1 CAPSULE BY MOUTH ONCE WEEKLY        Allergies:  No Known Allergies  Family History: Family History  Problem Relation Age of Onset   COPD Mother    Cancer Mother    Hypertension Father    Congestive Heart Failure Father    COPD Father    Lung cancer Father    Heart attack Father 92   Hypertension Sister    Diabetes Paternal Grandfather    Heart attack Paternal Grandfather    Healthy Son    Healthy Daughter  Social History:   reports that he has never smoked. He has never used smokeless tobacco. He reports that he does not currently use alcohol. He reports that he does not use drugs.  Physical Exam: BP 124/80   Pulse 62   Wt (!) 306 lb (138.8 kg)   BMI 45.19 kg/m   Constitutional:  Alert and oriented, no acute distress, nontoxic appearing HEENT: [place], AT Cardiovascular: No clubbing, cyanosis, or edema Respiratory: Normal respiratory effort, no increased work of breathing Skin: No rashes, bruises or suspicious lesions Neurologic: Grossly intact, no focal deficits, moving all 4 extremities Psychiatric: Normal mood and affect  Laboratory Data: Results for orders placed or performed in visit on 01/23/23  Microscopic Examination   Urine  Result Value Ref Range   WBC, UA 0-5 0 - 5 /hpf   RBC, Urine 3-10 (A) 0 - 2 /hpf   Epithelial Cells (non renal) 0-10 0 - 10 /hpf   Bacteria, UA None seen None seen/Few  Urinalysis, Complete  Result Value Ref Range   Specific Gravity, UA 1.015 1.005 - 1.030   pH, UA 5.0 5.0 - 7.5   Color, UA Yellow Yellow   Appearance Ur Clear Clear   Leukocytes,UA Negative Negative   Protein,UA Negative Negative/Trace   Glucose, UA Negative  Negative   Ketones, UA Negative Negative   RBC, UA 2+ (A) Negative   Bilirubin, UA Negative Negative   Urobilinogen, Ur 0.2 0.2 - 1.0 mg/dL   Nitrite, UA Negative Negative   Microscopic Examination See below:    Pertinent Imaging: KUB, 01/23/2023: CLINICAL DATA:  Abdominal pain 3-4 days.   EXAM: ABDOMEN - 1 VIEW   COMPARISON:  11/11/2022.   FINDINGS: The bowel gas pattern is normal. No radio-opaque calculi or other significant radiographic abnormality are seen.   IMPRESSION: Negative.     Electronically Signed   By: Layla Maw M.D.   On: 01/26/2023 01:56  I personally reviewed the images referenced above and note a 4 mm distal right ureteral stone.  Assessment & Plan:   1. Right ureteral stone There appears to be distal migration of his 4 mm right renal stone to the distal right ureter on KUB today.  His UA is notable for microscopic hematuria alone, consistent with a right ureteral stone.  No signs of infection otherwise, though admittedly taking Augmentin at home may have cleared his urine.  We discussed various treatment options for his stone including trial of passage vs. ESWL vs. ureteroscopy with laser lithotripsy and stent.  We specifically discussed that ESWL is a less invasive procedure that requires less anesthesia, however patients have to pass their residual stone fragments, which may take several weeks and be associated with continued renal colic. Additionally, we discussed the limitations of ESWL including the low, 10-20% chance of treatment failure requiring repeat ESWL versus ureteroscopy in the future. By comparison, ureteroscopy is a more invasive surgical procedure that requires more anesthesia, but we can potentially treat multiple stones in a single procedure. However, URS does require placement of a ureteral stent.  I think he is at risk for ESWL failure due to habitus.  Based on this conversation, he would like to proceed with trial of passage, which  is reasonable.  I encouraged him to increase Flomax to twice daily, take Zofran as needed, take pain medication as needed, and gave him a strainer today.  Will see him back in clinic in 3 weeks for stone follow-up with repeat KUB prior and UA.  We discussed return precautions including fever, chills, uncontrolled pain, and uncontrolled nausea/vomiting.  He expressed understanding..  - ondansetron (ZOFRAN-ODT) 4 MG disintegrating tablet; Take 1 tablet (4 mg total) by mouth every 8 (eight) hours as needed for nausea or vomiting.  Dispense: 20 tablet; Refill: 0 - Abdomen 1 view (KUB); Future - CULTURE, URINE COMPREHENSIVE - Urinalysis, Complete   Return in about 3 weeks (around 02/13/2023) for Stone f/u with UA + KUB prior.  Carman Ching, PA-C  [facility] [address] [place], [place] [address] [phone]

## 2023-01-29 LAB — CULTURE, URINE COMPREHENSIVE

## 2023-01-30 ENCOUNTER — Other Ambulatory Visit: Payer: Self-pay | Admitting: *Deleted

## 2023-01-30 DIAGNOSIS — R319 Hematuria, unspecified: Secondary | ICD-10-CM

## 2023-01-31 ENCOUNTER — Other Ambulatory Visit: Payer: Medicare Other

## 2023-01-31 ENCOUNTER — Other Ambulatory Visit: Payer: 59

## 2023-01-31 ENCOUNTER — Telehealth: Payer: Self-pay | Admitting: Physician Assistant

## 2023-01-31 DIAGNOSIS — R319 Hematuria, unspecified: Secondary | ICD-10-CM

## 2023-01-31 LAB — URINALYSIS, COMPLETE
Bilirubin, UA: NEGATIVE
Glucose, UA: NEGATIVE
Ketones, UA: NEGATIVE
Leukocytes,UA: NEGATIVE
Nitrite, UA: NEGATIVE
Protein,UA: NEGATIVE
RBC, UA: NEGATIVE
Specific Gravity, UA: 1.025 (ref 1.005–1.030)
Urobilinogen, Ur: 0.2 mg/dL (ref 0.2–1.0)
pH, UA: 5.5 (ref 5.0–7.5)

## 2023-01-31 LAB — MICROSCOPIC EXAMINATION

## 2023-01-31 NOTE — Telephone Encounter (Signed)
Pt came in this morning to leave urine sample.  He called to find out if his results were back yet.  Message from Sam yesterday:  I called and spoke with the patient. He thinks he passed the stone but wasn't able to catch it. He is currently asymptomatic. Given rather bland UA at the time of culture, I recommended repeating UA tomorrow. Please add him to the lab schedule tomorrow morning around 10am for UA; Carollee Herter will review his results. If they appear grossly infected, ok to prescribe antibiotics per these culture results. If they continue to appear bland and he remains asymptomatic, ok to defer abx therapy.

## 2023-02-03 ENCOUNTER — Encounter: Payer: Self-pay | Admitting: Family

## 2023-02-03 NOTE — Assessment & Plan Note (Signed)
Checking labs today.  Will continue supplements as needed.  

## 2023-02-03 NOTE — Assessment & Plan Note (Signed)
Checking labs today.  Continue current therapy for lipid control. Will modify as needed based on labwork results.  

## 2023-02-03 NOTE — Assessment & Plan Note (Signed)
Blood pressure well controlled with current medications.  Continue current therapy.  Will reassess at follow up.  

## 2023-02-03 NOTE — Assessment & Plan Note (Signed)
Continue current diabetes POC, as patient has been well controlled on current regimen.  Will adjust meds if needed based on labs.  

## 2023-02-03 NOTE — Progress Notes (Signed)
Established Patient Office Visit  Subjective:  Patient ID: Aaron [name], male    DOB: 1965-12-05  Age: 57 y.o. MRN: [id number]  Chief Complaint  Patient presents with   Follow-up    1 mo f/u, b12    Patient is here today for his 1 month follow up.  He has been feeling well since last appointment.   He does have additional concerns to discuss today.  Has been having some diarrhea that he has been unable to control with OTC meds.   He is also due for his B12 injection today.  Labs are due today. He needs refills.   I have reviewed his active problem list, medication list, allergies, notes from last encounter, lab results for his appointment today.    No other concerns at this time.   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 06/2020 LV gram: EF 35-45%; b. 11/2021 Echo: EF 55%, no rwma, GrI DD, nl RV fxn, mild MR. ao root 42mm.   Cough 07/07/2020   Crohn's disease (HCC)    Elevated troponin 05/18/2020   Heart murmur    Hypertension    Hypokalemia 05/18/2020   Kidney stone    MDD (major depressive disorder)    NICM (nonischemic cardiomyopathy) (HCC)    a. 06/2020 LV gram: EF 35-45%; b. 11/2021 Echo: EF 55%, GrI DD.   Nonobstructive CAD (coronary artery disease)    a. 10/2011 Stress MV: Equivocal. EF 68%, no ischemia; b. 06/2020 Cath: LM nl, LAD 71m, LCX large, mild diff dzs, RCA mild diff dzs. EF 35-45%; c. 11/2021 ETT: Ex time 3:48. No acute ST/T changes. Max HR 105.   Obesity    OSA (obstructive sleep apnea)    Palpitations 03/31/2020   Pneumonia due to COVID-19 virus 05/11/2020   Premature atrial contractions    a 06/2019 Zio: Predom RSR. Freq PACs (13% burden).   PSVT (paroxysmal supraventricular tachycardia)     Shortness of breath 03/31/2020    Past Surgical History:  Procedure Laterality Date   CARDIAC CATHETERIZATION     COLONOSCOPY N/A 06/14/2021   Procedure: COLONOSCOPY;  Surgeon: Jaynie Collins, DO;  Location: [facility] ENDOSCOPY;  Service: Gastroenterology;  Laterality: N/A;   ESOPHAGOGASTRODUODENOSCOPY N/A 06/14/2021   Procedure: ESOPHAGOGASTRODUODENOSCOPY (EGD);  Surgeon: Jaynie Collins, DO;  Location: [facility] ENDOSCOPY;  Service: Gastroenterology;  Laterality: N/A;   KIDNEY STONE SURGERY     LEFT HEART CATH AND CORONARY ANGIOGRAPHY Left 06/27/2020   Procedure: LEFT HEART CATH AND CORONARY ANGIOGRAPHY;  Surgeon: Yvonne Kendall, MD;  Location: [facility] INVASIVE CV LAB;  Service: Cardiovascular;  Laterality: Left;   VASECTOMY      Social History   Socioeconomic History   Marital status: Widowed    Spouse name: carisa   Number of children: 2   Years of education: Not on file   Highest education level: High school graduate  Occupational History   Not on file  Tobacco Use   Smoking status: Never   Smokeless tobacco: Never  Vaping Use   Vaping status: Never Used  Substance and Sexual Activity   Alcohol use: Not Currently   Drug use: Never   Sexual activity: Not Currently  Other Topics  Concern   Not on file  Social History Narrative   Not on file   Social Determinants of Health   Financial Resource Strain: Low Risk  (09/02/2018)   Overall Financial Resource Strain (CARDIA)    Difficulty of Paying Living Expenses: Not hard at all  Food Insecurity: No Food Insecurity (09/02/2018)   Hunger Vital Sign    Worried About Running Out of Food in the Last Year: Never true    Ran Out of Food in the Last Year: Never true  Transportation Needs: No Transportation Needs (09/02/2018)   PRAPARE - Administrator, Civil Service (Medical): No    Lack of Transportation (Non-Medical): No  Physical Activity: Insufficiently Active (09/02/2018)   Exercise Vital Sign    Days of Exercise  per Week: 2 days    Minutes of Exercise per Session: 30 min  Stress: Stress Concern Present (09/02/2018)   [employer] of Occupational Health - Occupational Stress Questionnaire    Feeling of Stress : Very much  Social Connections: Unknown (09/02/2018)   Social Connection and Isolation Panel [NHANES]    Frequency of Communication with Friends and Family: Not on file    Frequency of Social Gatherings with Friends and Family: Not on file    Attends Religious Services: Never    Active Member of Clubs or Organizations: No    Attends Banker Meetings: Never    Marital Status: Married  Catering manager Violence: Not At Risk (09/02/2018)   Humiliation, Afraid, Rape, and Kick questionnaire    Fear of Current or Ex-Partner: No    Emotionally Abused: No    Physically Abused: No    Sexually Abused: No    Family History  Problem Relation Age of Onset   COPD Mother    Cancer Mother    Hypertension Father    Congestive Heart Failure Father    COPD Father    Lung cancer Father    Heart attack Father 61   Hypertension Sister    Diabetes Paternal Grandfather    Heart attack Paternal Grandfather    Healthy Son    Healthy Daughter     No Known Allergies  Review of Systems  Gastrointestinal:  Positive for diarrhea.  All other systems reviewed and are negative.      Objective:   BP 110/89   Pulse 80   Ht 5\' 9"  (1.753 m)   Wt (!) 305 lb (138.3 kg)   SpO2 99%   BMI 45.04 kg/m   Vitals:   01/20/23 1404  BP: 110/89  Pulse: 80  Height: 5\' 9"  (1.753 m)  Weight: (!) 305 lb (138.3 kg)  SpO2: 99%  BMI (Calculated): 45.02    Physical Exam Vitals and nursing note reviewed.  Constitutional:      Appearance: Normal appearance. He is normal weight.  Eyes:     Extraocular Movements: Extraocular movements intact.     Conjunctiva/sclera: Conjunctivae normal.     Pupils: Pupils are equal, round, and reactive to light.  Cardiovascular:     Rate and Rhythm: Normal rate  and regular rhythm.     Pulses: Normal pulses.     Heart sounds: Normal heart sounds.  Pulmonary:     Effort: Pulmonary effort is normal.  Musculoskeletal:        General: Normal range of motion.     Cervical back: Normal range of motion.  Neurological:     General: No focal deficit present.     Mental  Status: He is alert and oriented to person, place, and time. Mental status is at baseline.  Psychiatric:        Mood and Affect: Mood normal.        Behavior: Behavior normal.        Thought Content: Thought content normal.        Judgment: Judgment normal.      No results found for any visits on 01/20/23.  Recent Results (from the past 2160 hour(s))  POC CREATINE & ALBUMIN,URINE     Status: Abnormal   Collection Time: 11/11/22  2:08 PM  Result Value Ref Range   Microalbumin Ur, POC 10 mg/L   Creatinine, POC 50 mg/dL   Albumin/Creatinine Ratio, Urine, POC 30-300   Lipid panel     Status: Abnormal   Collection Time: 11/11/22  2:15 PM  Result Value Ref Range   Cholesterol, Total 124 100 - 199 mg/dL   Triglycerides 045 (H) 0 - 149 mg/dL   HDL 31 (L) >40 mg/dL   VLDL Cholesterol Cal 30 5 - 40 mg/dL   LDL Chol Calc (NIH) 63 0 - 99 mg/dL   LDL CALC COMMENT: CANCELED     Comment: Test not performed  Result canceled by the ancillary.    Chol/HDL Ratio 4.0 0.0 - 5.0 ratio    Comment:                                   T. Chol/HDL Ratio                                             Men  Women                               1/2 Avg.Risk  3.4    3.3                                   Avg.Risk  5.0    4.4                                2X Avg.Risk  9.6    7.1                                3X Avg.Risk 23.4   11.0   VITAMIN D 25 Hydroxy (Vit-D Deficiency, Fractures)     Status: None   Collection Time: 11/11/22  2:15 PM  Result Value Ref Range   Vit D, 25-Hydroxy 45.8 30.0 - 100.0 ng/mL    Comment: Vitamin D deficiency has been defined by the Institute of Medicine and an Endocrine  Society practice guideline as a level of serum 25-OH vitamin D less than 20 ng/mL (1,2). The Endocrine Society went on to further define vitamin D insufficiency as a level between 21 and 29 ng/mL (2). 1. IOM (Institute of Medicine). 2010. Dietary reference    intakes for calcium and D. [place]: The    [employer]. 2. Holick MF, Binkley Broxton, Bischoff-Ferrari HA, et al.    Evaluation,  treatment, and prevention of vitamin D    deficiency: an Endocrine Society clinical practice    guideline. JCEM. 2011 Jul; 96(7):1911-30.   CBC With Differential     Status: Abnormal   Collection Time: 11/11/22  2:15 PM  Result Value Ref Range   WBC 9.0 3.4 - 10.8 x10E3/uL   RBC 5.37 4.14 - 5.80 x10E6/uL   Hemoglobin 16.1 13.0 - 17.7 g/dL   Hematocrit 47.8 29.5 - 51.0 %   MCV 89 79 - 97 fL   MCH 30.0 26.6 - 33.0 pg   MCHC 33.6 31.5 - 35.7 g/dL   RDW 62.1 30.8 - 65.7 %   Neutrophils 51 Not Estab. %   Lymphs 34 Not Estab. %   Monocytes 11 Not Estab. %   Eos 3 Not Estab. %   Basos 1 Not Estab. %   Neutrophils Absolute 4.6 1.4 - 7.0 x10E3/uL   Lymphocytes Absolute 3.1 0.7 - 3.1 x10E3/uL   Monocytes Absolute 1.0 (H) 0.1 - 0.9 x10E3/uL   EOS (ABSOLUTE) 0.3 0.0 - 0.4 x10E3/uL   Basophils Absolute 0.1 0.0 - 0.2 x10E3/uL   Immature Granulocytes 0 Not Estab. %   Immature Grans (Abs) 0.0 0.0 - 0.1 x10E3/uL    Comment: **Effective December 30, 2022, profile [id number] CBC/Differential**   (No Platelet) will be made non-orderable. [facility] Offers:   [id number] CBC With Differential/Platelet   CMP14+EGFR     Status: Abnormal   Collection Time: 11/11/22  2:15 PM  Result Value Ref Range   Glucose 68 (L) 70 - 99 mg/dL   BUN 15 6 - 24 mg/dL   Creatinine, Ser 9.52 0.76 - 1.27 mg/dL   eGFR 94 >84 XL/KGM/0.10   BUN/Creatinine Ratio 16 9 - 20   Sodium 140 134 - 144 mmol/L   Potassium 4.3 3.5 - 5.2 mmol/L   Chloride 105 96 - 106 mmol/L   CO2 21 20 - 29 mmol/L   Calcium 9.3 8.7 - 10.2 mg/dL   Total  Protein 7.1 6.0 - 8.5 g/dL   Albumin 4.1 3.8 - 4.9 g/dL   Globulin, Total 3.0 1.5 - 4.5 g/dL   Albumin/Globulin Ratio 1.4    Bilirubin Total 0.2 0.0 - 1.2 mg/dL   Alkaline Phosphatase 93 44 - 121 IU/L   AST 22 0 - 40 IU/L   ALT 38 0 - 44 IU/L  TSH     Status: None   Collection Time: 11/11/22  2:15 PM  Result Value Ref Range   TSH 0.991 0.450 - 4.500 uIU/mL  Hemoglobin A1c     Status: Abnormal   Collection Time: 11/11/22  2:15 PM  Result Value Ref Range   Hgb A1c MFr Bld 6.0 (H) 4.8 - 5.6 %    Comment:          Prediabetes: 5.7 - 6.4          Diabetes: >6.4          Glycemic control for adults with diabetes: <7.0    Est. average glucose Bld gHb Est-mCnc 126 mg/dL  Vitamin U72     Status: Abnormal   Collection Time: 11/11/22  2:15 PM  Result Value Ref Range   Vitamin B-12 >2000 (H) 232 - 1245 pg/mL  CULTURE, URINE COMPREHENSIVE     Status: Abnormal   Collection Time: 01/23/23  3:58 PM   Specimen: Urine   UR  Result Value Ref Range   Urine Culture, Comprehensive Final report (A)    Organism ID, Bacteria  Klebsiella aerogenes (A)     Comment: 50,000-100,000 colony forming units per mL formerly Enterobacter aerogenes Susceptibility profile is consistent with a probable ESBL.    ANTIMICROBIAL SUSCEPTIBILITY Comment     Comment:       ** S = Susceptible; I = Intermediate; R = Resistant **                    P = Positive; N = Negative             MICS are expressed in micrograms per mL    Antibiotic                 RSLT#1    RSLT#2    RSLT#3    RSLT#4 Amoxicillin/Clavulanic Acid    R Cefazolin                      R Cefepime                       S Ceftriaxone                    R Cefuroxime                     R Ciprofloxacin                  S Ertapenem                      S Gentamicin                     S Imipenem                       S Levofloxacin                   S Meropenem                      S Nitrofurantoin                 I Tetracycline                    S Tobramycin                     S Trimethoprim/Sulfa             S   Urinalysis, Complete     Status: Abnormal   Collection Time: 01/23/23  3:58 PM  Result Value Ref Range   Specific Gravity, UA 1.015 1.005 - 1.030   pH, UA 5.0 5.0 - 7.5   Color, UA Yellow Yellow   Appearance Ur Clear Clear   Leukocytes,UA Negative Negative   Protein,UA Negative Negative/Trace   Glucose, UA Negative Negative   Ketones, UA Negative Negative   RBC, UA 2+ (A) Negative   Bilirubin, UA Negative Negative   Urobilinogen, Ur 0.2 0.2 - 1.0 mg/dL   Nitrite, UA Negative Negative   Microscopic Examination See below:   Microscopic Examination     Status: Abnormal   Collection Time: 01/23/23  3:58 PM   Urine  Result Value Ref Range   WBC, UA 0-5 0 - 5 /hpf   RBC, Urine 3-10 (A) 0 - 2 /hpf   Epithelial Cells (non renal) 0-10 0 - 10 /hpf   Bacteria, UA  None seen None seen/Few  Urinalysis, Complete     Status: None   Collection Time: 01/31/23  8:14 AM  Result Value Ref Range   Specific Gravity, UA 1.025 1.005 - 1.030   pH, UA 5.5 5.0 - 7.5   Color, UA Yellow Yellow   Appearance Ur Clear Clear   Leukocytes,UA Negative Negative   Protein,UA Negative Negative/Trace   Glucose, UA Negative Negative   Ketones, UA Negative Negative   RBC, UA Negative Negative   Bilirubin, UA Negative Negative   Urobilinogen, Ur 0.2 0.2 - 1.0 mg/dL   Nitrite, UA Negative Negative   Microscopic Examination See below:   Microscopic Examination     Status: None   Collection Time: 01/31/23  8:14 AM   Urine  Result Value Ref Range   WBC, UA 0-5 0 - 5 /hpf   RBC, Urine 0-2 0 - 2 /hpf   Epithelial Cells (non renal) 0-10 0 - 10 /hpf   Bacteria, UA Few None seen/Few       Assessment & Plan:   Problem List Items Addressed This Visit       Active Problems   Essential hypertension    Blood pressure well controlled with current medications.  Continue current therapy.  Will reassess at follow up.       Morbid obesity with  BMI of 45.0-49.9, adult (HCC)    Continue current meds.  Will adjust as needed based on results.  The patient is asked to make an attempt to improve diet and exercise patterns to aid in medical management of this problem. Addressed importance of increasing and maintaining water intake.        Mixed hyperlipidemia    Checking labs today.  Continue current therapy for lipid control. Will modify as needed based on labwork results.       Type 2 diabetes mellitus with hyperglycemia (HCC)    Continue current diabetes POC, as patient has been well controlled on current regimen.  Will adjust meds if needed based on labs.       Vitamin B12 deficiency - Primary    Checking labs today.  Will continue supplements as needed.         Return in about 1 month (around 02/20/2023) for F/U.   Total time spent: 20 minutes  Miki Kins, FNP  01/20/2023   This document may have been prepared by [facility] Voice Recognition software and as such may include unintentional dictation errors.

## 2023-02-03 NOTE — Assessment & Plan Note (Signed)
Continue current meds.  Will adjust as needed based on results.  The patient is asked to make an attempt to improve diet and exercise patterns to aid in medical management of this problem. Addressed importance of increasing and maintaining water intake.   

## 2023-02-14 ENCOUNTER — Ambulatory Visit: Payer: 59 | Admitting: Physician Assistant

## 2023-02-18 ENCOUNTER — Ambulatory Visit: Payer: Medicare Other | Admitting: Family

## 2023-02-18 ENCOUNTER — Encounter: Payer: Self-pay | Admitting: Family

## 2023-02-18 VITALS — BP 110/80 | HR 62 | Ht 69.0 in | Wt 306.4 lb

## 2023-02-18 DIAGNOSIS — E559 Vitamin D deficiency, unspecified: Secondary | ICD-10-CM

## 2023-02-18 DIAGNOSIS — I10 Essential (primary) hypertension: Secondary | ICD-10-CM | POA: Diagnosis not present

## 2023-02-18 DIAGNOSIS — E538 Deficiency of other specified B group vitamins: Secondary | ICD-10-CM | POA: Diagnosis not present

## 2023-02-18 DIAGNOSIS — E782 Mixed hyperlipidemia: Secondary | ICD-10-CM

## 2023-02-18 DIAGNOSIS — Z6841 Body Mass Index (BMI) 40.0 and over, adult: Secondary | ICD-10-CM

## 2023-02-18 DIAGNOSIS — E1165 Type 2 diabetes mellitus with hyperglycemia: Secondary | ICD-10-CM

## 2023-02-18 DIAGNOSIS — R5383 Other fatigue: Secondary | ICD-10-CM

## 2023-02-18 MED ORDER — AZITHROMYCIN 250 MG PO TABS: ORAL_TABLET | ORAL | 0 refills | Status: AC

## 2023-02-18 MED ORDER — CYANOCOBALAMIN 1000 MCG/ML IJ SOLN
1000.0000 ug | Freq: Once | INTRAMUSCULAR | Status: AC
Start: 2023-02-18 — End: 2023-02-18
  Administered 2023-02-18: 1000 ug via INTRAMUSCULAR

## 2023-02-18 NOTE — Progress Notes (Signed)
Established Patient Office Visit  Subjective:  Patient ID: Aaron [name], male    DOB: April 30, 1966  Age: 57 y.o. MRN: [id number]  Chief Complaint  Patient presents with   Follow-up    1 mo    Patient is here today for his 1 month follow up.  He has been feeling poorly since last appointment.   He does have additional concerns to discuss today.  He has been having issues with sinus pressure and ear pain.  States that he has been having those issues for approximately a week.   Labs are due today. He needs refills.   I have reviewed his active problem list, medication list, allergies, notes from last encounter, lab results for his appointment today.      No other concerns at this time.   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 06/2020 LV gram: EF 35-45%; b. 11/2021 Echo: EF 55%, no rwma, GrI DD, nl RV fxn, mild MR. ao root 42mm.   Cough 07/07/2020   Crohn's disease (HCC)    Elevated troponin 05/18/2020   Heart murmur    Hypertension    Hypokalemia 05/18/2020   Kidney stone    MDD (major depressive disorder)    NICM (nonischemic cardiomyopathy) (HCC)    a. 06/2020 LV gram: EF 35-45%; b. 11/2021 Echo: EF 55%, GrI DD.   Nonobstructive CAD (coronary artery disease)    a. 10/2011 Stress MV: Equivocal. EF 68%, no ischemia; b. 06/2020 Cath: LM nl, LAD 38m, LCX large, mild diff dzs, RCA mild diff dzs. EF 35-45%; c. 11/2021 ETT: Ex time 3:48. No acute ST/T changes. Max HR 105.   Obesity    OSA (obstructive sleep apnea)    Palpitations 03/31/2020   Pneumonia due to COVID-19 virus 05/11/2020   Premature atrial contractions    a 06/2019 Zio: Predom RSR. Freq PACs (13% burden).   PSVT (paroxysmal supraventricular tachycardia)     Shortness of breath 03/31/2020    Past Surgical History:  Procedure Laterality Date   CARDIAC CATHETERIZATION     COLONOSCOPY N/A 06/14/2021   Procedure: COLONOSCOPY;  Surgeon: Jaynie Collins, DO;  Location[facility] ENDOSCOPY;  Service: Gastroenterology;  Laterality: N/A;   ESOPHAGOGASTRODUODENOSCOPY N/A 06/14/2021   Procedure: ESOPHAGOGASTRODUODENOSCOPY (EGD);  Surgeon: Jaynie Collins, DO;  Location: [facility] ENDOSCOPY;  Service: Gastroenterology;  Laterality: N/A;   KIDNEY STONE SURGERY     LEFT HEART CATH AND CORONARY ANGIOGRAPHY Left 06/27/2020   Procedure: LEFT HEART CATH AND CORONARY ANGIOGRAPHY;  Surgeon: Yvonne Kendall, MD;  Location: [facility] INVASIVE CV LAB;  Service: Cardiovascular;  Laterality: Left;   VASECTOMY      Social History   Socioeconomic History   Marital status: Widowed    Spouse name: carisa   Number of children: 2   Years of education: Not on file   Highest education level: High school graduate  Occupational History   Not on file  Tobacco Use   Smoking status: Never   Smokeless tobacco: Never  Vaping Use   Vaping status: Never Used  Substance and Sexual Activity   Alcohol use: Not Currently   Drug use: Never   Sexual activity: Not Currently  Other Topics Concern  Not on file  Social History Narrative   Not on file   Social Determinants of Health   Financial Resource Strain: Low Risk  (09/02/2018)   Overall Financial Resource Strain (CARDIA)    Difficulty of Paying Living Expenses: Not hard at all  Food Insecurity: No Food Insecurity (09/02/2018)   Hunger Vital Sign    Worried About Running Out of Food in the Last Year: Never true    Ran Out of Food in the Last Year: Never true  Transportation Needs: No Transportation Needs (09/02/2018)   PRAPARE - Administrator, Civil Service (Medical): No    Lack of Transportation (Non-Medical): No  Physical Activity: Insufficiently Active (09/02/2018)   Exercise Vital Sign    Days of Exercise  per Week: 2 days    Minutes of Exercise per Session: 30 min  Stress: Stress Concern Present (09/02/2018)   [employer] of Occupational Health - Occupational Stress Questionnaire    Feeling of Stress : Very much  Social Connections: Unknown (09/02/2018)   Social Connection and Isolation Panel [NHANES]    Frequency of Communication with Friends and Family: Not on file    Frequency of Social Gatherings with Friends and Family: Not on file    Attends Religious Services: Never    Active Member of Clubs or Organizations: No    Attends Banker Meetings: Never    Marital Status: Married  Catering manager Violence: Not At Risk (09/02/2018)   Humiliation, Afraid, Rape, and Kick questionnaire    Fear of Current or Ex-Partner: No    Emotionally Abused: No    Physically Abused: No    Sexually Abused: No    Family History  Problem Relation Age of Onset   COPD Mother    Cancer Mother    Hypertension Father    Congestive Heart Failure Father    COPD Father    Lung cancer Father    Heart attack Father 86   Hypertension Sister    Diabetes Paternal Grandfather    Heart attack Paternal Grandfather    Healthy Son    Healthy Daughter     No Known Allergies  Review of Systems  HENT:  Positive for congestion, ear pain and sinus pain.   All other systems reviewed and are negative.      Objective:   BP 110/80   Pulse 62   Ht 5\' 9"  (1.753 m)   Wt (!) 306 lb 6.4 oz (139 kg)   SpO2 97%   BMI 45.25 kg/m   Vitals:   02/18/23 1306  BP: 110/80  Pulse: 62  Height: 5\' 9"  (1.753 m)  Weight: (!) 306 lb 6.4 oz (139 kg)  SpO2: 97%  BMI (Calculated): 45.23    Physical Exam Vitals and nursing note reviewed.  Constitutional:      Appearance: Normal appearance. He is obese.  HENT:     Right Ear: Hearing, tympanic membrane, ear canal and external ear normal.     Left Ear: External ear normal. Swelling present. Tympanic membrane is erythematous and bulging.     Nose:  Congestion and rhinorrhea present.  Eyes:     Pupils: Pupils are equal, round, and reactive to light.  Cardiovascular:     Rate and Rhythm: Normal rate and regular rhythm.     Pulses: Normal pulses.     Heart sounds: Normal heart sounds.  Pulmonary:     Effort: Pulmonary effort is normal.     Breath sounds: Normal  breath sounds.  Neurological:     General: No focal deficit present.     Mental Status: He is alert and oriented to person, place, and time. Mental status is at baseline.  Psychiatric:        Mood and Affect: Mood normal.        Behavior: Behavior normal.        Thought Content: Thought content normal.        Judgment: Judgment normal.      No results found for any visits on 02/18/23.  Recent Results (from the past 2160 hour(s))  CULTURE, URINE COMPREHENSIVE     Status: Abnormal   Collection Time: 01/23/23  3:58 PM   Specimen: Urine   UR  Result Value Ref Range   Urine Culture, Comprehensive Final report (A)    Organism ID, Bacteria Klebsiella aerogenes (A)     Comment: 50,000-100,000 colony forming units per mL formerly Enterobacter aerogenes Susceptibility profile is consistent with a probable ESBL.    ANTIMICROBIAL SUSCEPTIBILITY Comment     Comment:       ** S = Susceptible; I = Intermediate; R = Resistant **                    P = Positive; N = Negative             MICS are expressed in micrograms per mL    Antibiotic                 RSLT#1    RSLT#2    RSLT#3    RSLT#4 Amoxicillin/Clavulanic Acid    R Cefazolin                      R Cefepime                       S Ceftriaxone                    R Cefuroxime                     R Ciprofloxacin                  S Ertapenem                      S Gentamicin                     S Imipenem                       S Levofloxacin                   S Meropenem                      S Nitrofurantoin                 I Tetracycline                   S Tobramycin                     S Trimethoprim/Sulfa              S   Urinalysis, Complete     Status: Abnormal   Collection Time: 01/23/23  3:58 PM  Result Value Ref Range  Specific Gravity, UA 1.015 1.005 - 1.030   pH, UA 5.0 5.0 - 7.5   Color, UA Yellow Yellow   Appearance Ur Clear Clear   Leukocytes,UA Negative Negative   Protein,UA Negative Negative/Trace   Glucose, UA Negative Negative   Ketones, UA Negative Negative   RBC, UA 2+ (A) Negative   Bilirubin, UA Negative Negative   Urobilinogen, Ur 0.2 0.2 - 1.0 mg/dL   Nitrite, UA Negative Negative   Microscopic Examination See below:   Microscopic Examination     Status: Abnormal   Collection Time: 01/23/23  3:58 PM   Urine  Result Value Ref Range   WBC, UA 0-5 0 - 5 /hpf   RBC, Urine 3-10 (A) 0 - 2 /hpf   Epithelial Cells (non renal) 0-10 0 - 10 /hpf   Bacteria, UA None seen None seen/Few  Urinalysis, Complete     Status: None   Collection Time: 01/31/23  8:14 AM  Result Value Ref Range   Specific Gravity, UA 1.025 1.005 - 1.030   pH, UA 5.5 5.0 - 7.5   Color, UA Yellow Yellow   Appearance Ur Clear Clear   Leukocytes,UA Negative Negative   Protein,UA Negative Negative/Trace   Glucose, UA Negative Negative   Ketones, UA Negative Negative   RBC, UA Negative Negative   Bilirubin, UA Negative Negative   Urobilinogen, Ur 0.2 0.2 - 1.0 mg/dL   Nitrite, UA Negative Negative   Microscopic Examination See below:   Microscopic Examination     Status: None   Collection Time: 01/31/23  8:14 AM   Urine  Result Value Ref Range   WBC, UA 0-5 0 - 5 /hpf   RBC, Urine 0-2 0 - 2 /hpf   Epithelial Cells (non renal) 0-10 0 - 10 /hpf   Bacteria, UA Few None seen/Few       Assessment & Plan:   Problem List Items Addressed This Visit       Active Problems   Essential hypertension    Blood pressure well controlled with current medications.  Continue current therapy.  Will reassess at follow up.       Relevant Orders   CMP14+EGFR   CBC with Differential/Platelet   Morbid obesity  with BMI of 45.0-49.9, adult (HCC)    HLD and HTN 2/2 obesity.  Continue current meds.  Will adjust as needed based on results.  The patient is asked to make an attempt to improve diet and exercise patterns to aid in medical management of this problem. Addressed importance of increasing and maintaining water intake.        Mixed hyperlipidemia    Checking labs today.  Continue current therapy for lipid control. Will modify as needed based on labwork results.       Relevant Orders   Lipid panel   CMP14+EGFR   CBC with Differential/Platelet   Type 2 diabetes mellitus with hyperglycemia (HCC)    Checking labs today. Will call pt. With results  Continue current diabetes POC, as patient has been well controlled on current regimen.  Will adjust meds if needed based on labs.       Relevant Orders   CMP14+EGFR   Hemoglobin A1c   CBC with Differential/Platelet   Vitamin B12 deficiency - Primary    Checking labs today.  Will continue supplements as needed.        Relevant Orders   CMP14+EGFR   Vitamin B12   CBC with Differential/Platelet   Other Visit Diagnoses  Vitamin D deficiency, unspecified       Checking labs today Patient counseled on dietary choices and verbalized understanding.   Relevant Orders   VITAMIN D 25 Hydroxy (Vit-D Deficiency, Fractures)   CMP14+EGFR   CBC with Differential/Platelet   Other fatigue       Relevant Orders   CMP14+EGFR   TSH   CBC with Differential/Platelet       Return in about 1 month (around 03/20/2023) for F/U.   Total time spent: 30 minutes  Miki Kins, FNP  02/18/2023   This document may have been prepared by [facility] Voice Recognition software and as such may include unintentional dictation errors.

## 2023-02-18 NOTE — Assessment & Plan Note (Signed)
Blood pressure well controlled with current medications.  Continue current therapy.  Will reassess at follow up.

## 2023-02-18 NOTE — Assessment & Plan Note (Signed)
Checking labs today.  Continue current therapy for lipid control. Will modify as needed based on labwork results.

## 2023-02-18 NOTE — Assessment & Plan Note (Signed)
Checking labs today.  Will continue supplements as needed.

## 2023-02-18 NOTE — Assessment & Plan Note (Signed)
HLD and HTN 2/2 obesity.  Continue current meds.  Will adjust as needed based on results.  The patient is asked to make an attempt to improve diet and exercise patterns to aid in medical management of this problem. Addressed importance of increasing and maintaining water intake.

## 2023-02-18 NOTE — Assessment & Plan Note (Signed)
Checking labs today. Will call pt. With results  Continue current diabetes POC, as patient has been well controlled on current regimen.  Will adjust meds if needed based on labs.

## 2023-03-05 ENCOUNTER — Other Ambulatory Visit: Payer: Self-pay | Admitting: Cardiology

## 2023-03-12 ENCOUNTER — Other Ambulatory Visit: Payer: Self-pay | Admitting: Internal Medicine

## 2023-03-12 NOTE — Telephone Encounter (Signed)
Good Morning,  Could you schedule this patient a 6 month follow up visit? The patient was last seen by Dr. Okey Dupre on 09-26-22. Thank you so much.

## 2023-03-20 ENCOUNTER — Ambulatory Visit: Payer: Medicare Other

## 2023-03-20 ENCOUNTER — Ambulatory Visit (INDEPENDENT_AMBULATORY_CARE_PROVIDER_SITE_OTHER): Payer: Medicare Other | Admitting: Family

## 2023-03-20 DIAGNOSIS — Z23 Encounter for immunization: Secondary | ICD-10-CM

## 2023-03-20 DIAGNOSIS — E538 Deficiency of other specified B group vitamins: Secondary | ICD-10-CM | POA: Diagnosis not present

## 2023-03-20 MED ORDER — CYANOCOBALAMIN 1000 MCG/ML IJ SOLN
1000.0000 ug | Freq: Once | INTRAMUSCULAR | Status: AC
Start: 2023-03-20 — End: 2023-03-20
  Administered 2023-03-20: 1000 ug via INTRAMUSCULAR

## 2023-03-21 ENCOUNTER — Other Ambulatory Visit: Payer: Self-pay | Admitting: Family

## 2023-03-22 ENCOUNTER — Encounter: Payer: Self-pay | Admitting: Family

## 2023-03-22 NOTE — Assessment & Plan Note (Signed)
B12 injection given in office today.  Return for next injection per provider instructions.

## 2023-03-22 NOTE — Progress Notes (Signed)
   CHIEF COMPLAINT  B12 Shot  Flu Vaccine Need     REASON FOR VISIT  B12 Injection  Flu Vaccine Need     ASSESSMENT  B12 Deficiency, Unspecified Flu Vaccine Need   PLAN  Problem List Items Addressed This Visit       Active Problems   Vitamin B12 deficiency - Primary    B12 injection given in office today.  Return for next injection per provider instructions.      Other Visit Diagnoses     Flu vaccine need       Flu vaccine given in office today.  Pt encouraged to manage side effects with supportive measures.   Relevant Orders   Influenza, MDCK, trivalent, PF(Flucelvax egg-free) (Completed)      Total time spent: 5 minutes  Miki Kins, FNP  03/20/2023

## 2023-04-04 ENCOUNTER — Encounter: Payer: Self-pay | Admitting: Family

## 2023-04-04 ENCOUNTER — Ambulatory Visit (INDEPENDENT_AMBULATORY_CARE_PROVIDER_SITE_OTHER): Payer: Medicare Other | Admitting: Family

## 2023-04-04 VITALS — BP 127/82 | HR 69 | Ht 69.0 in | Wt 312.0 lb

## 2023-04-04 DIAGNOSIS — M47816 Spondylosis without myelopathy or radiculopathy, lumbar region: Secondary | ICD-10-CM | POA: Insufficient documentation

## 2023-04-04 DIAGNOSIS — E1165 Type 2 diabetes mellitus with hyperglycemia: Secondary | ICD-10-CM | POA: Diagnosis not present

## 2023-04-04 DIAGNOSIS — Z0130 Encounter for examination of blood pressure without abnormal findings: Secondary | ICD-10-CM

## 2023-04-04 LAB — POCT CBG (FASTING - GLUCOSE)-MANUAL ENTRY: Glucose Fasting, POC: 74 mg/dL (ref 70–99)

## 2023-04-04 MED ORDER — HYDROXYZINE HCL 10 MG PO TABS: 10.0000 mg | ORAL_TABLET | Freq: Three times a day (TID) | ORAL | 0 refills | Status: DC | PRN

## 2023-04-04 MED ORDER — PREDNISONE 20 MG PO TABS: 40.0000 mg | ORAL_TABLET | Freq: Every day | ORAL | 0 refills | Status: DC

## 2023-04-04 NOTE — Progress Notes (Signed)
Annual Wellness Visit  Patient: Aaron [name], Male    DOB: [birth year], 57 y.o.   MRN: [id number] Visit Date: 04/06/2023  Today's Provider: Miki Kins, FNP  Subjective:   No chief complaint on file.  Aaron [name] is a 57 y.o. male who presents today for his Annual Wellness Visit.  He is doing well, has no major concerns today.  Has had his eye exam, has already had his flu shot.      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 06/2020 LV gram: EF 35-45%; b. 11/2021 Echo: EF 55%, no rwma, GrI DD, nl RV fxn, mild MR. ao root 42mm.   Cough 07/07/2020   Crohn's disease (HCC)    Elevated troponin 05/18/2020   Heart murmur    Hypertension    Hypokalemia 05/18/2020   Kidney stone    MDD (major depressive disorder)    NICM (nonischemic cardiomyopathy) (HCC)    a. 06/2020 LV gram: EF 35-45%; b. 11/2021 Echo: EF 55%, GrI DD.   Nonobstructive CAD (coronary artery disease)    a. 10/2011 Stress MV: Equivocal. EF 68%, no ischemia; b. 06/2020 Cath: LM nl, LAD 29m, LCX large, mild diff dzs, RCA mild diff dzs. EF 35-45%; c. 11/2021 ETT: Ex time 3:48. No acute ST/T changes. Max HR 105.   Obesity    OSA (obstructive sleep apnea)    Palpitations 03/31/2020   Pneumonia due to COVID-19 virus 05/11/2020   Premature atrial contractions    a 06/2019 Zio: Predom RSR. Freq PACs (13% burden).   PSVT (paroxysmal supraventricular tachycardia) (HCC)    Shortness of breath 03/31/2020   Past Surgical History:  Procedure Laterality Date   CARDIAC CATHETERIZATION     COLONOSCOPY N/A 06/14/2021   Procedure: COLONOSCOPY;  Surgeon: Jaynie Collins, DO;  Location[facility] ENDOSCOPY;  Service: Gastroenterology;  Laterality: N/A;    ESOPHAGOGASTRODUODENOSCOPY N/A 06/14/2021   Procedure: ESOPHAGOGASTRODUODENOSCOPY (EGD);  Surgeon: Jaynie Collins, DO;  Location: [facility] ENDOSCOPY;  Service: Gastroenterology;  Laterality: N/A;   KIDNEY STONE SURGERY     LEFT HEART CATH AND CORONARY ANGIOGRAPHY Left 06/27/2020   Procedure: LEFT HEART CATH AND CORONARY ANGIOGRAPHY;  Surgeon: Yvonne Kendall, MD;  Location: [facility] INVASIVE CV LAB;  Service: Cardiovascular;  Laterality: Left;   VASECTOMY     Family History  Problem Relation Age of Onset   COPD Mother    Cancer Mother    Hypertension Father    Congestive Heart Failure Father    COPD Father    Lung cancer Father    Heart attack Father 46   Hypertension Sister    Diabetes Paternal Grandfather    Heart attack Paternal Grandfather    Healthy Son    Healthy Daughter    Social History   Socioeconomic History   Marital status: Widowed    Spouse name: carisa   Number of children: 2   Years of education: Not on file   Highest education level: High school graduate  Occupational History   Not on file  Tobacco Use   Smoking status: Never   Smokeless tobacco: Never  Vaping Use   Vaping status: Never Used  Substance and Sexual Activity  Alcohol use: Not Currently   Drug use: Never   Sexual activity: Not Currently  Other Topics Concern   Not on file  Social History Narrative   Not on file   Social Determinants of Health   Financial Resource Strain: Low Risk  (09/02/2018)   Overall Financial Resource Strain (CARDIA)    Difficulty of Paying Living Expenses: Not hard at all  Food Insecurity: No Food Insecurity (09/02/2018)   Hunger Vital Sign    Worried About Running Out of Food in the Last Year: Never true    Ran Out of Food in the Last Year: Never true  Transportation Needs: No Transportation Needs (09/02/2018)   PRAPARE - Administrator, Civil Service (Medical): No    Lack of Transportation (Non-Medical): No  Physical Activity: Insufficiently Active  (09/02/2018)   Exercise Vital Sign    Days of Exercise per Week: 2 days    Minutes of Exercise per Session: 30 min  Stress: Stress Concern Present (09/02/2018)   [employer] of Occupational Health - Occupational Stress Questionnaire    Feeling of Stress : Very much  Social Connections: Unknown (09/02/2018)   Social Connection and Isolation Panel [NHANES]    Frequency of Communication with Friends and Family: Not on file    Frequency of Social Gatherings with Friends and Family: Not on file    Attends Religious Services: Never    Active Member of Clubs or Organizations: No    Attends Banker Meetings: Never    Marital Status: Married  Catering manager Violence: Not At Risk (09/02/2018)   Humiliation, Afraid, Rape, and Kick questionnaire    Fear of Current or Ex-Partner: No    Emotionally Abused: No    Physically Abused: No    Sexually Abused: No    Medications: Outpatient Medications Prior to Visit  Medication Sig   acetaminophen (TYLENOL) 325 MG tablet Take 650-975 mg by mouth every 6 (six) hours as needed for moderate pain or headache (for pain.).   albuterol (VENTOLIN HFA) 108 (90 Base) MCG/ACT inhaler Inhale 2 puffs into the lungs every 6 (six) hours as needed for wheezing or shortness of breath.   amitriptyline (ELAVIL) 25 MG tablet Take 25 mg by mouth at bedtime.   amoxicillin-clavulanate (AUGMENTIN) 875-125 MG tablet Take 1 tablet by mouth 2 (two) times daily.   Azelastine HCl 137 MCG/SPRAY SOLN SPRAY 1 SPRAY INTO EACH NOSTRIL TWICE A DAY   cetirizine (ZYRTEC) 10 MG tablet Take 1 tablet (10 mg total) by mouth daily as needed (allergies.).   diphenoxylate-atropine (LOMOTIL) 2.5-0.025 MG tablet TAKE 1 TABLET BY MOUTH 4 (FOUR) TIMES DAILY AS NEEDED FOR DIARRHEA OR LOOSE STOOLS.   flecainide (TAMBOCOR) 50 MG tablet Take 25 mg by mouth daily.   fluticasone (FLONASE) 50 MCG/ACT nasal spray Place 2 sprays into both nostrils daily as needed (allergies.).   furosemide  (LASIX) 40 MG tablet Take 20 mg by mouth daily.   ipratropium (ATROVENT HFA) 17 MCG/ACT inhaler Inhale 2 puffs into the lungs every 6 (six) hours.   losartan (COZAAR) 50 MG tablet TAKE 1 TABLET BY MOUTH EVERY DAY   metoprolol succinate (TOPROL-XL) 50 MG 24 hr tablet Take 1 tablet (50 mg total) by mouth daily.   metoprolol tartrate (LOPRESSOR) 25 MG tablet TAKE 1 TABLET (25 MG) BY MOUTH ONCE DAILY AS NEEDED FOR TACHYCARDIA/PLEASE CALL OFFICE TO SCHEDULE APPOINTMENT PRIOR TO NEXT REFILL   montelukast (SINGULAIR) 10 MG tablet Take 1 tablet (10 mg total)  by mouth at bedtime as needed (allergies.).   omeprazole (PRILOSEC) 40 MG capsule Take 1 capsule (40 mg total) by mouth daily.   ondansetron (ZOFRAN) 4 MG tablet Take 4 mg by mouth every 8 (eight) hours as needed for nausea/vomiting.   ondansetron (ZOFRAN-ODT) 4 MG disintegrating tablet Take 1 tablet (4 mg total) by mouth every 8 (eight) hours as needed for nausea or vomiting.   oxyCODONE-acetaminophen (PERCOCET/ROXICET) 5-325 MG tablet Take 1 tablet by mouth every 4 (four) hours as needed for severe pain.   pregabalin (LYRICA) 100 MG capsule Take 100 mg by mouth 2 (two) times daily.   spironolactone (ALDACTONE) 50 MG tablet Take 0.5 tablets (25 mg total) by mouth daily.   tamsulosin (FLOMAX) 0.4 MG CAPS capsule TAKE 1 CAPSULE BY MOUTH EVERY DAY   Vitamin D, Ergocalciferol, (DRISDOL) 1.25 MG (50000 UNIT) CAPS capsule TAKE 1 CAPSULE BY MOUTH ONCE WEEKLY   No facility-administered medications prior to visit.    No Known Allergies  Patient Care Team: Miki Kins, FNP as PCP - General (Family Medicine) End, Cristal Deer, MD as PCP - Cardiology (Cardiology) Lanier Prude, MD as PCP - Electrophysiology (Cardiology) Georgiann Cocker, MD (Psychiatry)  Review of Systems  All other systems reviewed and are negative.      Objective:    Vitals: BP 127/82   Pulse 69   Ht 5\' 9"  (1.753 m)   Wt (!) 312 lb (141.5 kg)   SpO2 95%   BMI  46.07 kg/m    Physical Exam Vitals and nursing note reviewed.  Constitutional:      Appearance: Normal appearance. He is obese.  Eyes:     Pupils: Pupils are equal, round, and reactive to light.  Cardiovascular:     Rate and Rhythm: Normal rate and regular rhythm.     Pulses: Normal pulses.     Heart sounds: Normal heart sounds.  Pulmonary:     Effort: Pulmonary effort is normal.     Breath sounds: Normal breath sounds.  Neurological:     Mental Status: He is alert.  Psychiatric:        Mood and Affect: Mood normal.        Behavior: Behavior normal.        Thought Content: Thought content normal.        Judgment: Judgment normal.     Most recent functional status assessment:    04/06/2023    1:36 PM  In your present state of health, do you have any difficulty performing the following activities:  Vision? 0  Difficulty concentrating or making decisions? 0  Walking or climbing stairs? 0  Dressing or bathing? 0  Doing errands, shopping? 0  Preparing Food and eating ? N  Using the Toilet? N  In the past six months, have you accidently leaked urine? N  Do you have problems with loss of bowel control? N  Managing your Medications? N  Managing your Finances? N  Housekeeping or managing your Housekeeping? N    Most recent fall risk assessment:    04/04/2023    1:57 PM  Fall Risk   Falls in the past year? 0  Number falls in past yr: 0  Injury with Fall? 0  Risk for fall due to : No Fall Risks  Follow up Falls prevention discussed     Most recent depression screenings:    04/04/2023    1:00 PM 05/20/2018    9:01 AM  PHQ 2/9 Scores  PHQ - 2 Score 0 0    Most recent cognitive screening:    04/04/2023    1:58 PM  6CIT Screen  What Year? 0 points  What month? 0 points  What time? 0 points  Count back from 20 0 points  Months in reverse 0 points  Repeat phrase 2 points  Total Score 2 points    Results for orders placed or performed in visit on 04/04/23   POCT CBG (Fasting - Glucose)  Result Value Ref Range   Glucose Fasting, POC 74 70 - 99 mg/dL       Assessment & Plan:      Annual wellness visit done today including the all of the following: Reviewed patient's Family Medical History Reviewed and updated list of patient's medical providers Assessment of cognitive impairment was done Assessed patient's functional ability Established a written schedule for health screening services Health Risk Assessent Completed and Reviewed  Exercise Activities and Dietary recommendations  Goals      Maintain Mobility and Function     Evidence-based guidance:  Acknowledge and validate impact of pain, loss of strength and potential disfigurement (hand osteoarthritis) on mental health and daily life, such as social isolation, anxiety, depression, impaired sexual relationship and   injury from falls.  Anticipate referral to physical or occupational therapy for assessment, therapeutic exercise and recommendation for adaptive equipment or assistive devices; encourage participation.  Assess impact on ability to perform activities of daily living, as well as engage in sports and leisure events or requirements of work or school.  Provide anticipatory guidance and reassurance about the benefit of exercise to maintain function; acknowledge and normalize fear that exercise may worsen symptoms.  Encourage regular exercise, at least 10 minutes at a time for 45 minutes per week; consider yoga, water exercise and proprioceptive exercises; encourage use of wearable activity tracker to increase motivation and adherence.  Encourage maintenance or resumption of daily activities, including employment, as pain allows and with minimal exposure to trauma.  Assist patient to advocate for adaptations to the work environment.  Consider level of pain and function, gender, age, lifestyle, patient preference, quality of life, readiness and ?ocapacity to benefit? when  recommending patients for orthopaedic surgery consultation.  Explore strategies, such as changes to medication regimen or activity that enables patient to anticipate and manage flare-ups that increase deconditioning and disability.  Explore patient preferences; encourage exposure to a broader range of activities that have been avoided for fear of experiencing pain.  Identify barriers to participation in therapy or exercise, such as pain with activity, anticipated or imagined pain.  Monitor postoperative joint replacement or any preexisting joint replacement for ongoing pain and loss of function; provide social support and encouragement throughout recovery.   Notes:      Weight (lb) < 250 lb (113.4 kg)        Immunization History  Administered Date(s) Administered   Influenza, Mdck, Trivalent,PF 6+ MOS(egg free) 03/20/2023   Influenza,inj,Quad PF,6+ Mos 05/20/2018, 06/10/2020   Moderna Sars-Covid-2 Vaccination 06/09/2020   Tdap 12/14/2019    Health Maintenance  Topic Date Due   FOOT EXAM  Never done   OPHTHALMOLOGY EXAM  Never done   Hepatitis C Screening  Never done   Zoster Vaccines- Shingrix (1 of 2) Never done   COVID-19 Vaccine (2 - Moderna risk series) 07/07/2020   HEMOGLOBIN A1C  08/18/2023   Diabetic kidney evaluation - Urine ACR  11/11/2023   Diabetic kidney evaluation - eGFR measurement  02/18/2024  Medicare Annual Wellness (AWV)  04/03/2024   DTaP/Tdap/Td (2 - Td or Tdap) 12/13/2029   Colonoscopy  06/15/2031   INFLUENZA VACCINE  Completed   HIV Screening  Completed   HPV VACCINES  Aged Out     Discussed health benefits of physical activity, and encouraged him to engage in regular exercise appropriate for his age and condition.        Miki Kins, FNP   04/04/2023  This document may have been prepared by Dragon Voice Recognition software and as such may include unintentional dictation errors.

## 2023-04-06 ENCOUNTER — Encounter: Payer: Self-pay | Admitting: Family

## 2023-04-10 ENCOUNTER — Encounter: Payer: Self-pay | Admitting: Nurse Practitioner

## 2023-04-10 ENCOUNTER — Ambulatory Visit: Payer: Medicare Other | Attending: Nurse Practitioner | Admitting: Nurse Practitioner

## 2023-04-10 VITALS — BP 124/78 | HR 64 | Ht 69.0 in | Wt 310.2 lb

## 2023-04-10 DIAGNOSIS — I471 Supraventricular tachycardia, unspecified: Secondary | ICD-10-CM | POA: Diagnosis not present

## 2023-04-10 DIAGNOSIS — I5032 Chronic diastolic (congestive) heart failure: Secondary | ICD-10-CM | POA: Diagnosis not present

## 2023-04-10 DIAGNOSIS — G4733 Obstructive sleep apnea (adult) (pediatric): Secondary | ICD-10-CM | POA: Diagnosis not present

## 2023-04-10 DIAGNOSIS — I440 Atrioventricular block, first degree: Secondary | ICD-10-CM | POA: Diagnosis not present

## 2023-04-10 DIAGNOSIS — I10 Essential (primary) hypertension: Secondary | ICD-10-CM

## 2023-04-10 NOTE — Progress Notes (Signed)
Office Visit    Patient Name: Aaron [name] Date of Encounter: 04/10/2023  Primary Care Provider:  Miki Kins, FNP Primary Cardiologist:  Yvonne Kendall, MD/ EP: Jeanie Cooks, MD  Chief Complaint    57 y.o. male  with history of nonobstructive CAD, nonischemic cardiomyopathy/chronic heart failure with improved ejection fraction, hypertension, nephrolithiasis, morbid obesity, obstructive sleep apnea, and PSVT, who presents for follow-up related to PSVT.   Past Medical History  Subjective   Past Medical History:  Diagnosis Date   Abdominal pain 02/10/2021   Abnormal EKG 03/31/2020   Abnormal stress test 06/27/2020   ADD (attention deficit disorder)    Chest pain 12/17/2019   Chronic back pain    disc   Chronic HFimpEF (heart failure with improved ejection fraction) (HCC)    a. 06/2020 LV gram: EF 35-45%; b. 11/2021 Echo: EF 55%, no rwma, GrI DD, nl RV fxn, mild MR. ao root 42mm.   Cough 07/07/2020   Crohn's disease (HCC)    Elevated troponin 05/18/2020   Heart murmur    Hypertension    Hypokalemia 05/18/2020   Kidney stone    MDD (major depressive disorder)    NICM (nonischemic cardiomyopathy) (HCC)    a. 06/2020 LV gram: EF 35-45%; b. 11/2021 Echo: EF 55%, GrI DD.   Nonobstructive CAD (coronary artery disease)    a. 10/2011 Stress MV: Equivocal. EF 68%, no ischemia; b. 06/2020 Cath: LM nl, LAD 64m, LCX large, mild diff dzs, RCA mild diff dzs. EF 35-45%; c. 11/2021 ETT: Ex time 3:48. No acute ST/T changes. Max HR 105.   Obesity    OSA (obstructive sleep apnea)    Palpitations 03/31/2020   Pneumonia due to COVID-19 virus 05/11/2020   Premature atrial contractions    a 06/2019 Zio: Predom RSR. Freq PACs (13% burden).   PSVT (paroxysmal supraventricular tachycardia) (HCC)    Shortness of breath 03/31/2020   Past Surgical History:  Procedure Laterality Date   CARDIAC CATHETERIZATION     COLONOSCOPY N/A 06/14/2021   Procedure: COLONOSCOPY;  Surgeon: Jaynie Collins, DO;  Location: [facility] ENDOSCOPY;  Service: Gastroenterology;  Laterality: N/A;   ESOPHAGOGASTRODUODENOSCOPY N/A 06/14/2021   Procedure: ESOPHAGOGASTRODUODENOSCOPY (EGD);  Surgeon: Jaynie Collins, DO;  Location: [facility] ENDOSCOPY;  Service: Gastroenterology;  Laterality: N/A;   KIDNEY STONE SURGERY     LEFT HEART CATH AND CORONARY ANGIOGRAPHY Left 06/27/2020   Procedure: LEFT HEART CATH AND CORONARY ANGIOGRAPHY;  Surgeon: Yvonne Kendall, MD;  Location: [facility] INVASIVE CV LAB;  Service: Cardiovascular;  Laterality: Left;   VASECTOMY      Allergies  No Known Allergies    History of Present Illness      57 y.o. y/o male with above complex past medical history including nonobstructive CAD, nonischemic cardiomyopathy/chronic heart failure with improved ejection fraction, hypertension, nephrolithiasis, morbid obesity, obstructive sleep apnea, Crohn's disease, and PSVT.  He was previously evaluated in July 2021 in the setting of chest pain and palpitations.  He subsequently underwent stress testing in August 2021 which suggested a mild inferolateral reversible defect.  EF was calculated at 35%.  He also underwent event monitoring October 2021 which showed a 13% PAC burden.  In the setting of ongoing dyspnea and abnormal EKG, decision was made to pursue diagnostic catheterization.  This was performed in January 2022 showing mild, nonobstructive disease with an EF of 35 to 45% by ventriculography.  He was medically managed with beta-blocker, statin, and long-acting nitrate.  He was later  placed on ARB therapy.   In April 2023, he was seen in the emergency department with abrupt onset palpitations and was found to be in atrial flutter with 2-1 AV block, which converted to sinus rhythm on metoprolol.  He had a second episode a few weeks later.  Echocardiogram in June 2023 showed improvement in EF to 55%.  He was placed on flecainide therapy and seen by electrophysiology.  Follow-up exercise treadmill  testing was notable for poor exercise tolerance (3 minutes 48 seconds) without exercise-induced arrhythmias or widening of the QRS.  He had recurrent PSVT in August 2023, prompting ED evaluation.  He was referred back to electrophysiology and was felt to be a poor candidate for titration of flecainide or metoprolol due to baseline bradycardia and first-degree AV block, and poor candidate for catheter ablation due to A1c periprocedural risk related to weight and need for general anesthesia as well as potential worsening of conduction system disease with ablation.   [name] -was last in cardiology clinic in June 2024, at which time he was overall stable.  He had dropped his flecainide dose to 25 mg daily on his own and did not wish to go back up to 25 twice daily.  He was referred to pulmonology for sleep eval, though hasn't f/u yet.  He has done well over the past several months.  He has not had any sustained tachycardias, though on 3 occasions, he had noted mild elevations of heart rate into the 80s and 90s in the setting of a steroid taper for a rash on his leg.  He is not particularly active in the setting of chronic back pain.  He denies chest pain, dyspnea, PND, orthopnea, dizziness, syncope, edema, or early satiety.  Objective  Home Medications    Current Outpatient Medications  Medication Sig Dispense Refill   acetaminophen (TYLENOL) 325 MG tablet Take 650-975 mg by mouth every 6 (six) hours as needed for moderate pain or headache (for pain.).     albuterol (VENTOLIN HFA) 108 (90 Base) MCG/ACT inhaler Inhale 2 puffs into the lungs every 6 (six) hours as needed for wheezing or shortness of breath. 18 g 5   amitriptyline (ELAVIL) 25 MG tablet Take 25 mg by mouth at bedtime.     amoxicillin-clavulanate (AUGMENTIN) 875-125 MG tablet Take 1 tablet by mouth 2 (two) times daily. 20 tablet 1   Azelastine HCl 137 MCG/SPRAY SOLN SPRAY 1 SPRAY INTO EACH NOSTRIL TWICE A DAY 30 mL 3   cetirizine (ZYRTEC)  10 MG tablet Take 1 tablet (10 mg total) by mouth daily as needed (allergies.).     diphenoxylate-atropine (LOMOTIL) 2.5-0.025 MG tablet TAKE 1 TABLET BY MOUTH 4 (FOUR) TIMES DAILY AS NEEDED FOR DIARRHEA OR LOOSE STOOLS. 120 tablet 1   flecainide (TAMBOCOR) 50 MG tablet Take 25 mg by mouth daily.     fluticasone (FLONASE) 50 MCG/ACT nasal spray Place 2 sprays into both nostrils daily as needed (allergies.). 16 g 5   furosemide (LASIX) 40 MG tablet Take 20 mg by mouth daily.     hydrOXYzine (ATARAX) 10 MG tablet Take 1 tablet (10 mg total) by mouth 3 (three) times daily as needed. 30 tablet 0   ipratropium (ATROVENT HFA) 17 MCG/ACT inhaler Inhale 2 puffs into the lungs every 6 (six) hours.     losartan (COZAAR) 50 MG tablet TAKE 1 TABLET BY MOUTH EVERY DAY 90 tablet 0   metoprolol succinate (TOPROL-XL) 50 MG 24 hr tablet Take 1 tablet (50  mg total) by mouth daily. 180 tablet 3   metoprolol tartrate (LOPRESSOR) 25 MG tablet TAKE 1 TABLET (25 MG) BY MOUTH ONCE DAILY AS NEEDED FOR TACHYCARDIA/PLEASE CALL OFFICE TO SCHEDULE APPOINTMENT PRIOR TO NEXT REFILL 90 tablet 0   montelukast (SINGULAIR) 10 MG tablet Take 1 tablet (10 mg total) by mouth at bedtime as needed (allergies.).     omeprazole (PRILOSEC) 40 MG capsule Take 1 capsule (40 mg total) by mouth daily. 90 capsule 1   ondansetron (ZOFRAN) 4 MG tablet Take 4 mg by mouth every 8 (eight) hours as needed for nausea/vomiting.     ondansetron (ZOFRAN-ODT) 4 MG disintegrating tablet Take 1 tablet (4 mg total) by mouth every 8 (eight) hours as needed for nausea or vomiting. 20 tablet 0   oxyCODONE-acetaminophen (PERCOCET/ROXICET) 5-325 MG tablet Take 1 tablet by mouth every 4 (four) hours as needed for severe pain. 20 tablet 0   pregabalin (LYRICA) 100 MG capsule Take 100 mg by mouth 2 (two) times daily.     spironolactone (ALDACTONE) 50 MG tablet Take 0.5 tablets (25 mg total) by mouth daily. 45 tablet 3   tamsulosin (FLOMAX) 0.4 MG CAPS capsule TAKE 1  CAPSULE BY MOUTH EVERY DAY 90 capsule 1   Vitamin D, Ergocalciferol, (DRISDOL) 1.25 MG (50000 UNIT) CAPS capsule TAKE 1 CAPSULE BY MOUTH ONCE WEEKLY 12 capsule 3   No current facility-administered medications for this visit.     Physical Exam    VS:  BP 124/78 (BP Location: Left Arm, Patient Position: Sitting, Cuff Size: Large)   Pulse 64   Ht 5\' 9"  (1.753 m)   Wt (!) 310 lb 3.2 oz (140.7 kg)   SpO2 97%   BMI 45.81 kg/m  , BMI Body mass index is 45.81 kg/m.       GEN: Obese, in no acute distress. HEENT: normal. Neck: Supple, obese, difficult to gauge JVP.  No bruits or masses.   Cardiac: RRR, no murmurs, rubs, or gallops. No clubbing, cyanosis, edema.  Radials 2+/PT 2+ and equal bilaterally.  Respiratory:  Respirations regular and unlabored, clear to auscultation bilaterally. GI: Obese, soft, nontender, nondistended, BS + x 4. MS: no deformity or atrophy. Skin: warm and dry, no rash. Neuro:  Strength and sensation are intact. Psych: Normal affect.  Accessory Clinical Findings    ECG personally reviewed by me today - EKG Interpretation Date/Time:  Thursday April 10 2023 14:25:07 EST Ventricular Rate:  69 PR Interval:  262 QRS Duration:  96 QT Interval:  404 QTC Calculation: 432 R Axis:   -26  Text Interpretation: Sinus rhythm with 1st degree A-V block with occasional Premature ventricular complexes and Premature atrial complexes Septal infarct , age undetermined T wave abnormality, consider anterolateral ischemia Confirmed by Nicolasa Ducking [phone]) on 04/10/2023 2:29:28 PM  - no acute changes.  Lab Results  Component Value Date   WBC 9.0 11/11/2022   HGB 16.1 11/11/2022   HCT 47.9 11/11/2022   MCV 89 11/11/2022   PLT 301 01/05/2022   Lab Results  Component Value Date   CREATININE 0.93 02/18/2023   BUN 11 02/18/2023   NA 139 02/18/2023   K 4.3 02/18/2023   CL 103 02/18/2023   CO2 23 02/18/2023   Lab Results  Component Value Date   ALT 25 02/18/2023    AST 25 02/18/2023   ALKPHOS 93 02/18/2023   BILITOT 0.4 02/18/2023   Lab Results  Component Value Date   CHOL 131 02/18/2023   HDL  27 (L) 02/18/2023   LDLCALC 62 02/18/2023   TRIG 260 (H) 02/18/2023   CHOLHDL 4.9 02/18/2023    Lab Results  Component Value Date   HGBA1C 5.9 (H) 02/18/2023   Lab Results  Component Value Date   TSH 0.660 02/18/2023       Assessment & Plan    1.  PSVT: Quiescent on flecainide 25 mg daily and metoprolol succinate 50 mg daily.  He has not recently had to use any as needed metoprolol.  He will follow-up with electrophysiology in 3 months.  2.  Nonischemic cardiomyopathy/chronic heart failure with improved ejection fraction: EF 35 to 45% by ventriculography in 2022 at the time of diagnostic catheterization (nonobstructive CAD).  Echo in June 2023 showed an EF of 55% with grade 1 diastolic dysfunction and mild MR.  Body habitus makes exam challenging though overall appears euvolemic.  Heart rate and blood pressure stable.  He remains on beta-blocker, ARB, and diuretic therapy.  3.  Primary hypertension: Stable on beta-blocker, ARB, and diuretic.  4.  Obstructive sleep apnea: Had sleep study in August 2023 with recommendation for follow-up study and PAP titration.  He says he has tried multiple times to arrange this over the past year but no one answered the phone over the sleep center.  He and his wife would like this to be dealt with and requested a repeat pulmonology referral, which I will make today.  5.  First-degree AV block: PR interval stable at 262 ms.  6.  Disposition: Follow-up with Dr. Lalla Brothers in 3 months or sooner if necessary.  Nicolasa Ducking, NP 04/10/2023, 2:30 PM

## 2023-04-10 NOTE — Patient Instructions (Addendum)
Medication Instructions:  Your physician recommends that you continue on your current medications as directed. Please refer to the Current Medication list given to you today.  *If you need a refill on your cardiac medications before your next appointment, please call your pharmacy*  Lab Work: - None ordered  Testing/Procedures: - None ordered  Follow-Up: At [facility], you and your health needs are our priority.  As part of our continuing mission to provide you with exceptional heart care, we have created designated Provider Care Teams.  These Care Teams include your primary Cardiologist (physician) and Advanced Practice Providers (APPs -  Physician Assistants and Nurse Practitioners) who all work together to provide you with the care you need, when you need it.  Your next appointment:   Move appointment with Dr. Lalla Brothers to February   Other Instructions -Ambulatory referral to Pulmonology

## 2023-05-05 ENCOUNTER — Ambulatory Visit: Payer: Medicare Other | Admitting: Family

## 2023-05-05 DIAGNOSIS — E538 Deficiency of other specified B group vitamins: Secondary | ICD-10-CM

## 2023-05-05 MED ORDER — CYANOCOBALAMIN 1000 MCG/ML IJ SOLN
1000.0000 ug | Freq: Once | INTRAMUSCULAR | Status: AC
Start: 2023-05-05 — End: 2023-05-05
  Administered 2023-05-05: 1000 ug via INTRAMUSCULAR

## 2023-05-06 ENCOUNTER — Other Ambulatory Visit: Payer: Self-pay | Admitting: Family

## 2023-05-06 DIAGNOSIS — J329 Chronic sinusitis, unspecified: Secondary | ICD-10-CM

## 2023-05-15 ENCOUNTER — Encounter: Payer: Self-pay | Admitting: Internal Medicine

## 2023-05-15 ENCOUNTER — Ambulatory Visit: Payer: Medicare Other | Admitting: Internal Medicine

## 2023-05-15 VITALS — BP 128/80 | HR 63 | Temp 97.9°F | Ht 69.0 in | Wt 315.6 lb

## 2023-05-15 DIAGNOSIS — Z6841 Body Mass Index (BMI) 40.0 and over, adult: Secondary | ICD-10-CM

## 2023-05-15 DIAGNOSIS — G4733 Obstructive sleep apnea (adult) (pediatric): Secondary | ICD-10-CM

## 2023-05-15 DIAGNOSIS — J449 Chronic obstructive pulmonary disease, unspecified: Secondary | ICD-10-CM

## 2023-05-15 NOTE — Progress Notes (Signed)
@Patient  ID: Aaron [name], male    DOB: 02-Apr-1966, 57 y.o.   MRN: [id number]    Synopsis 57 year old male never smoker seen for pulmonary and sleep consult July 28, 2020 for sleep apnea and shortness of breath post COVID-19 infection and COVID-pneumonia in December 2021 Medical history significant for hypertension, cardiomyopathy, A. fib, depression, anxiety, ADD and chronic back pain Patient previously been diagnosed with sleep apnea in the past.  Has not been using his CPAP for the last several years.  Has ongoing daytime sleepiness, patient was recommended for a split-night sleep study.  Unfortunately patient had to cancel his study twice and has rescheduled for later in July. Patient complained of ongoing dyspnea after he had COVID-19 infection and COVID-pneumonia in December 2021.  Patient did require hospitalization he was treated with remdesivir and IV steroids.  He didrequire oxygen at discharge.  Previous CT chest showed mild patchy groundglass opacities and was negative for PE.  After discharge home patient was able to slowly wean off of his oxygen.  TEST/EVENTS :  Admitted December 2021 with acute respiratory failure with hypoxemia, COVID-19 infection complicated by Covid pneumonia treated with remdesivir and IV steroids discharged on oxygen   CT chest May 11, 2020 mild patchy groundglass opacities bilaterally in the lungs.  Negative for PE   Left heart cath and coronary angiography June 27, 2020 mild nonobstructive coronary disease, moderately reduced LVEF around 40% Repeat echo 01/2022 EF 55% with grade 1 diastolic dysfunction   Chief complaint Follow-up assessment for shortness of breath Follow-up assessment for OSA History of congestive heart failure  HPI Assessment of shortness of breath Previous COVID-19 infection   March 2022 pulmonary function test reviewed in detail with patient today 05/15/2023 FEV1 69% with a ratio of 76 FVC 70% predicted There  was no significant bronchodilator response DLCO was 89% predicted Findings are suggestive of moderate obstructive lung disease Patient uses inhalers as needed No maintenance therapy at this time   Assessment of OSA August 2023 PSG shows AHI of around 8 with significant hypoxia Patient has not used his CPAP machine in 1 year Will recommend reassessment with home sleep test  Morbidly obese Recommend strict diet Recommend weight loss   No exacerbation at this time No evidence of heart failure at this time No evidence or signs of infection at this time No respiratory distress No fevers, chills, nausea, vomiting, diarrhea No evidence of lower extremity edema No evidence hemoptysis   No Known Allergies  Immunization History  Administered Date(s) Administered   Influenza, Mdck, Trivalent,PF 6+ MOS(egg free) 03/20/2023   Influenza,inj,Quad PF,6+ Mos 05/20/2018, 06/10/2020   Moderna Sars-Covid-2 Vaccination 06/09/2020   Tdap 12/14/2019    Past Medical History:  Diagnosis Date   Abdominal pain 02/10/2021   Abnormal EKG 03/31/2020   Abnormal stress test 06/27/2020   ADD (attention deficit disorder)    Chest pain 12/17/2019   Chronic back pain    disc   Chronic HFimpEF (heart failure with improved ejection fraction) (HCC)    a. 06/2020 LV gram: EF 35-45%; b. 11/2021 Echo: EF 55%, no rwma, GrI DD, nl RV fxn, mild MR. ao root 42mm.   Cough 07/07/2020   Crohn's disease (HCC)    Elevated troponin 05/18/2020   Heart murmur    Hypertension    Hypokalemia 05/18/2020   Kidney stone    MDD (major depressive disorder)    NICM (nonischemic cardiomyopathy) (HCC)    a. 06/2020 LV gram: EF 35-45%; b. 11/2021  Echo: EF 55%, GrI DD.   Nonobstructive CAD (coronary artery disease)    a. 10/2011 Stress MV: Equivocal. EF 68%, no ischemia; b. 06/2020 Cath: LM nl, LAD 52m, LCX large, mild diff dzs, RCA mild diff dzs. EF 35-45%; c. 11/2021 ETT: Ex time 3:48. No acute ST/T changes. Max HR 105.    Obesity    OSA (obstructive sleep apnea)    Palpitations 03/31/2020   Pneumonia due to COVID-19 virus 05/11/2020   Premature atrial contractions    a 06/2019 Zio: Predom RSR. Freq PACs (13% burden).   PSVT (paroxysmal supraventricular tachycardia) (HCC)    Shortness of breath 03/31/2020    Tobacco History: Social History   Tobacco Use  Smoking Status Never  Smokeless Tobacco Never   Counseling given: Not Answered   Outpatient Medications Prior to Visit  Medication Sig Dispense Refill   acetaminophen (TYLENOL) 325 MG tablet Take 650-975 mg by mouth every 6 (six) hours as needed for moderate pain or headache (for pain.).     albuterol (VENTOLIN HFA) 108 (90 Base) MCG/ACT inhaler Inhale 2 puffs into the lungs every 6 (six) hours as needed for wheezing or shortness of breath. 18 g 5   amitriptyline (ELAVIL) 25 MG tablet Take 25 mg by mouth at bedtime.     amoxicillin-clavulanate (AUGMENTIN) 875-125 MG tablet Take 1 tablet by mouth 2 (two) times daily. 20 tablet 1   Azelastine HCl 137 MCG/SPRAY SOLN SPRAY 1 SPRAY INTO EACH NOSTRIL TWICE A DAY 30 mL 3   cetirizine (ZYRTEC) 10 MG tablet Take 1 tablet (10 mg total) by mouth daily as needed (allergies.).     diphenoxylate-atropine (LOMOTIL) 2.5-0.025 MG tablet TAKE 1 TABLET BY MOUTH 4 (FOUR) TIMES DAILY AS NEEDED FOR DIARRHEA OR LOOSE STOOLS. 120 tablet 1   flecainide (TAMBOCOR) 50 MG tablet Take 25 mg by mouth daily.     fluticasone (FLONASE) 50 MCG/ACT nasal spray Place 2 sprays into both nostrils daily as needed (allergies.). 16 g 5   furosemide (LASIX) 40 MG tablet Take 20 mg by mouth daily.     hydrOXYzine (ATARAX) 10 MG tablet Take 1 tablet (10 mg total) by mouth 3 (three) times daily as needed. 30 tablet 0   ipratropium (ATROVENT HFA) 17 MCG/ACT inhaler Inhale 2 puffs into the lungs every 6 (six) hours.     losartan (COZAAR) 50 MG tablet TAKE 1 TABLET BY MOUTH EVERY DAY 90 tablet 0   metoprolol succinate (TOPROL-XL) 50 MG 24 hr tablet  Take 1 tablet (50 mg total) by mouth daily. 180 tablet 3   metoprolol tartrate (LOPRESSOR) 25 MG tablet TAKE 1 TABLET (25 MG) BY MOUTH ONCE DAILY AS NEEDED FOR TACHYCARDIA/PLEASE CALL OFFICE TO SCHEDULE APPOINTMENT PRIOR TO NEXT REFILL 90 tablet 0   montelukast (SINGULAIR) 10 MG tablet TAKE 1 TABLET BY MOUTH EVERY DAY 90 tablet 3   omeprazole (PRILOSEC) 40 MG capsule TAKE 1 CAPSULE (40 MG TOTAL) BY MOUTH DAILY. 90 capsule 1   ondansetron (ZOFRAN) 4 MG tablet Take 4 mg by mouth every 8 (eight) hours as needed for nausea/vomiting.     ondansetron (ZOFRAN-ODT) 4 MG disintegrating tablet Take 1 tablet (4 mg total) by mouth every 8 (eight) hours as needed for nausea or vomiting. 20 tablet 0   oxyCODONE-acetaminophen (PERCOCET/ROXICET) 5-325 MG tablet Take 1 tablet by mouth every 4 (four) hours as needed for severe pain. 20 tablet 0   pregabalin (LYRICA) 100 MG capsule Take 100 mg by mouth 2 (two) times  daily.     spironolactone (ALDACTONE) 50 MG tablet Take 0.5 tablets (25 mg total) by mouth daily. 45 tablet 3   tamsulosin (FLOMAX) 0.4 MG CAPS capsule TAKE 1 CAPSULE BY MOUTH EVERY DAY 90 capsule 1   Vitamin D, Ergocalciferol, (DRISDOL) 1.25 MG (50000 UNIT) CAPS capsule TAKE 1 CAPSULE BY MOUTH ONCE WEEKLY 12 capsule 3   No facility-administered medications prior to visit.   BP 128/80 (BP Location: Right Arm, Cuff Size: Large)   Pulse 63   Temp 97.9 F (36.6 C) (Temporal)   Ht 5\' 9"  (1.753 m)   Wt (!) 315 lb 9.6 oz (143.2 kg)   SpO2 97%   BMI 46.61 kg/m     Review of Systems: Gen:  Denies  fever, sweats, chills weight loss  HEENT: Denies blurred vision, double vision, ear pain, eye pain, hearing loss, nose bleeds, sore throat Cardiac:  No dizziness, chest pain or heaviness, chest tightness,edema, No JVD Resp:   No cough, -sputum production, -shortness of breath,-wheezing, -hemoptysis,  Other:  All other systems negative   Physical Examination:   General Appearance: No distress  EYES  PERRLA, EOM intact.   NECK Supple, No JVD Pulmonary: normal breath sounds, No wheezing.  CardiovascularNormal S1,S2.  No m/r/g.   Abdomen: Benign, Soft, non-tender. Neurology UE/LE 5/5 strength, no focal deficits Ext pulses intact, cap refill intact ALL OTHER ROS ARE NEGATIVE      Assessment & Plan:   57 year old male morbidly obese with underlying sleep apnea with intermittent shortness of breath likely related to reactive airways disease and asthma/COPD which was triggered by COVID-19 infection in the setting of chronic shortness of breath and dyspnea on exertion  in the setting of deconditioned state  Shortness of breath dyspnea exertion Multifactorial in nature Pulmonary function test showed restrictive and obstructive pattern Continue inhalers as needed   Obstructive sleep apnea Plan for reevaluation Patient has a previous diagnosis of sleep apnea Patient has multiple symptoms including daytime sleepiness restless sleep and obesity Recommend home sleep study  Morbid obesity (HCC) Healthy weight loss.  Long discussion regarding weight loss.     MEDICATION ADJUSTMENTS/LABS AND TESTS ORDERED: Recommend home sleep study for reassessment of sleep apnea  Continue to use inhalers as needed  Recommend weight loss  Continue strict diet Recommend daily exercise  Follow up with Cardiology as scheduled  Avoid secondhand smoke Avoid SICK contacts Recommend  Masking  when appropriate Recommend Keep up-to-date with vaccinations   CURRENT MEDICATIONS REVIEWED AT LENGTH WITH PATIENT TODAY   Patient  satisfied with Plan of action and management. All questions answered  Follow up 3 months    Time Spent Involved in Patient Care on Day of Examination: 47 minutes     Denicia Pagliarulo Santiago Glad, M.D.  [facility]  Medical Director [facility] Medical Director [facility] Cardio-Pulmonary Department

## 2023-05-15 NOTE — Patient Instructions (Addendum)
Recommend home sleep study for reassessment of sleep apnea  Continue to use inhalers as needed  Recommend weight loss  Continue strict diet Recommend daily exercise  Follow up with Cardiology as scheduled  Avoid secondhand smoke Avoid SICK contacts Recommend  Masking  when appropriate Recommend Keep up-to-date with vaccinations

## 2023-05-17 ENCOUNTER — Other Ambulatory Visit: Payer: Self-pay | Admitting: Internal Medicine

## 2023-05-19 NOTE — Telephone Encounter (Signed)
Please advise if ok to refill historical medication. ? ?

## 2023-05-21 ENCOUNTER — Telehealth: Payer: Self-pay | Admitting: Family

## 2023-05-21 ENCOUNTER — Ambulatory Visit: Payer: Medicare Other | Admitting: Cardiology

## 2023-05-21 NOTE — Telephone Encounter (Signed)
Patient called in c/o cough and congestion. Has green drainage/phlegm. Would like something called in for cough and congestion. He is currently taking Mucinex (blue box).  [facility] [address]

## 2023-05-21 NOTE — Telephone Encounter (Signed)
Per the last office visit on 04/10/23, it was noted that the patient was taking Lasix 20 mg daily. However, the refill request is for 40 mg daily. The nurse contacted the patient to verify the Lasix dosage. The patient indicated that he is currently taking half of a 40 mg tablet daily, which equals 20 mg  Will forward to [facility] for refill approval.

## 2023-05-22 ENCOUNTER — Telehealth: Payer: Self-pay | Admitting: Family

## 2023-05-22 NOTE — Telephone Encounter (Signed)
Pt is coughing bad & green mucus, wants to see if you can send him a script in for it.  Advised pt and his wife that you would probably want him to come in and be seen before a script is called in..   Please Advise

## 2023-06-05 ENCOUNTER — Encounter: Payer: Self-pay | Admitting: Cardiology

## 2023-06-05 ENCOUNTER — Ambulatory Visit (INDEPENDENT_AMBULATORY_CARE_PROVIDER_SITE_OTHER): Payer: Medicare Other | Admitting: Family

## 2023-06-05 ENCOUNTER — Ambulatory Visit: Payer: Medicare Other | Admitting: Cardiology

## 2023-06-05 VITALS — BP 138/86 | HR 97 | Ht 69.0 in | Wt 317.3 lb

## 2023-06-05 DIAGNOSIS — Z0130 Encounter for examination of blood pressure without abnormal findings: Secondary | ICD-10-CM

## 2023-06-05 DIAGNOSIS — E538 Deficiency of other specified B group vitamins: Secondary | ICD-10-CM | POA: Diagnosis not present

## 2023-06-05 DIAGNOSIS — J069 Acute upper respiratory infection, unspecified: Secondary | ICD-10-CM

## 2023-06-05 MED ORDER — AMOXICILLIN-POT CLAVULANATE 875-125 MG PO TABS: 1.0000 | ORAL_TABLET | Freq: Two times a day (BID) | ORAL | 0 refills | Status: AC

## 2023-06-05 MED ORDER — BENZONATATE 100 MG PO CAPS
100.0000 mg | ORAL_CAPSULE | Freq: Three times a day (TID) | ORAL | 1 refills | Status: DC | PRN
Start: 2023-06-05 — End: 2023-07-30

## 2023-06-05 MED ORDER — ALBUTEROL SULFATE HFA 108 (90 BASE) MCG/ACT IN AERS: 2.0000 | INHALATION_SPRAY | Freq: Four times a day (QID) | RESPIRATORY_TRACT | 5 refills | Status: AC | PRN

## 2023-06-05 NOTE — Progress Notes (Signed)
 Established Patient Office Visit  Subjective:  Patient ID: Aaron [name], male    DOB: [birth year]  Age: 58 y.o. MRN: [id number]  Chief Complaint  Patient presents with   Cough    Cough, 2 weeks     Patient in office for an acute visit. Patient complaining of a cough for 2 weeks. Patient states cough started 2 weeks ago, not getting better. Also complaining of PND, nasal congestion, runny nose and scratchy throat. Will send in Augmentin . Patient has Flonase  at home, continue. Continue Mucinex , Zyrtec , singular. Will also send in Tessalon  pearls.   Cough This is a new problem. The current episode started 1 to 4 weeks ago. The problem has been gradually worsening. The problem occurs constantly. The cough is Productive of sputum. Associated symptoms include nasal congestion, postnasal drip, rhinorrhea and a sore throat. Pertinent negatives include no chest pain, headaches, myalgias or shortness of breath. The symptoms are aggravated by lying down. Treatments tried: mucinex , nyquil, dayquil, robitussin. The treatment provided no relief.    No other concerns at this time.   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 06/2020 LV gram: EF 35-45%; b. 11/2021 Echo: EF 55%, no rwma, GrI DD, nl RV fxn, mild MR. ao root 42mm.   Cough 07/07/2020   Crohn's disease (HCC)    Elevated troponin 05/18/2020   Heart murmur    Hypertension    Hypokalemia 05/18/2020   Kidney stone    MDD (major depressive disorder)    NICM (nonischemic cardiomyopathy) (HCC)    a. 06/2020 LV gram: EF 35-45%; b. 11/2021 Echo: EF 55%, GrI DD.   Nonobstructive CAD (coronary artery disease)    a. 10/2011 Stress MV: Equivocal. EF 68%, no ischemia; b. 06/2020 Cath: LM nl, LAD  41m, LCX large, mild diff dzs, RCA mild diff dzs. EF 35-45%; c. 11/2021 ETT: Ex time 3:48. No acute ST/T changes. Max HR 105.   Obesity    OSA (obstructive sleep apnea)    Palpitations 03/31/2020   Pneumonia due to COVID-19 virus 05/11/2020   Premature atrial contractions    a 06/2019 Zio: Predom RSR. Freq PACs (13% burden).   PSVT (paroxysmal supraventricular tachycardia) (HCC)    Shortness of breath 03/31/2020    Past Surgical History:  Procedure Laterality Date   CARDIAC CATHETERIZATION     COLONOSCOPY N/A 06/14/2021   Procedure: COLONOSCOPY;  Surgeon: Onita Elspeth Sharper, DO;  Location: [facility] ENDOSCOPY;  Service: Gastroenterology;  Laterality: N/A;   ESOPHAGOGASTRODUODENOSCOPY N/A 06/14/2021   Procedure: ESOPHAGOGASTRODUODENOSCOPY (EGD);  Surgeon: Onita Elspeth Sharper, DO;  Location: [facility] ENDOSCOPY;  Service: Gastroenterology;  Laterality: N/A;   KIDNEY STONE SURGERY     LEFT HEART CATH AND CORONARY ANGIOGRAPHY Left 06/27/2020   Procedure: LEFT HEART CATH AND CORONARY ANGIOGRAPHY;  Surgeon: Mady Bruckner, MD;  Location: [facility] INVASIVE CV LAB;  Service: Cardiovascular;  Laterality: Left;   VASECTOMY      Social History   Socioeconomic History   Marital status: Widowed    Spouse name: carisa   Number of children: 2   Years of education: Not on file   Highest education level: High school graduate  Occupational History   Not on file  Tobacco Use   Smoking status: Never   Smokeless tobacco: Never  Vaping Use   Vaping status: Never Used  Substance and Sexual Activity   Alcohol use: Not Currently   Drug use: Never   Sexual activity: Not Currently  Other Topics Concern   Not on file  Social History Narrative   Not on file   Social Drivers of Health   Financial Resource Strain: Low Risk  (09/02/2018)   Overall Financial Resource Strain (CARDIA)    Difficulty of Paying Living Expenses: Not hard at all  Food Insecurity: No Food Insecurity (09/02/2018)   Hunger Vital Sign     Worried About Running Out of Food in the Last Year: Never true    Ran Out of Food in the Last Year: Never true  Transportation Needs: No Transportation Needs (09/02/2018)   PRAPARE - Administrator, Civil Service (Medical): No    Lack of Transportation (Non-Medical): No  Physical Activity: Insufficiently Active (09/02/2018)   Exercise Vital Sign    Days of Exercise per Week: 2 days    Minutes of Exercise per Session: 30 min  Stress: Stress Concern Present (09/02/2018)   [employer] of Occupational Health - Occupational Stress Questionnaire    Feeling of Stress : Very much  Social Connections: Unknown (09/02/2018)   Social Connection and Isolation Panel [NHANES]    Frequency of Communication with Friends and Family: Not on file    Frequency of Social Gatherings with Friends and Family: Not on file    Attends Religious Services: Never    Active Member of Clubs or Organizations: No    Attends Banker Meetings: Never    Marital Status: Married  Catering Manager Violence: Not At Risk (09/02/2018)   Humiliation, Afraid, Rape, and Kick questionnaire    Fear of Current or Ex-Partner: No    Emotionally Abused: No    Physically Abused: No    Sexually Abused: No    Family History  Problem Relation Age of Onset   COPD Mother    Cancer Mother    Hypertension Father    Congestive Heart Failure Father    COPD Father    Lung cancer Father    Heart attack Father 76   Hypertension Sister    Diabetes Paternal Grandfather    Heart attack Paternal Grandfather    Healthy Son    Healthy Daughter     No Known Allergies  Outpatient Medications Prior to Visit  Medication Sig   acetaminophen  (TYLENOL ) 325 MG tablet Take 650-975 mg by mouth every 6 (six) hours as needed for moderate pain or headache (for pain.).   amitriptyline (ELAVIL) 25 MG tablet Take 25 mg by mouth at bedtime.   Azelastine HCl 137 MCG/SPRAY SOLN SPRAY 1 SPRAY INTO EACH NOSTRIL TWICE A DAY   cetirizine   (ZYRTEC ) 10 MG tablet Take 1 tablet (10 mg total) by mouth daily as needed (allergies.).   diphenoxylate -atropine  (LOMOTIL ) 2.5-0.025 MG tablet TAKE 1 TABLET BY MOUTH 4 (FOUR) TIMES DAILY AS NEEDED FOR DIARRHEA OR LOOSE STOOLS.   flecainide  (TAMBOCOR ) 50 MG tablet Take 25 mg by mouth daily.   fluticasone  (FLONASE ) 50 MCG/ACT nasal spray Place 2 sprays into both nostrils daily as needed (allergies.).   furosemide  (LASIX ) 40 MG tablet TAKE 1 TABLET BY MOUTH EVERY DAY   hydrOXYzine  (ATARAX ) 10 MG tablet Take 1 tablet (10 mg total) by mouth 3 (three) times daily as needed.   ipratropium (ATROVENT  HFA) 17 MCG/ACT  inhaler Inhale 2 puffs into the lungs every 6 (six) hours.   losartan  (COZAAR ) 50 MG tablet TAKE 1 TABLET BY MOUTH EVERY DAY   metoprolol  succinate (TOPROL -XL) 50 MG 24 hr tablet Take 1 tablet (50 mg total) by mouth daily.   metoprolol  tartrate (LOPRESSOR ) 25 MG tablet TAKE 1 TABLET (25 MG) BY MOUTH ONCE DAILY AS NEEDED FOR TACHYCARDIA/PLEASE CALL OFFICE TO SCHEDULE APPOINTMENT PRIOR TO NEXT REFILL   montelukast  (SINGULAIR ) 10 MG tablet TAKE 1 TABLET BY MOUTH EVERY DAY   omeprazole  (PRILOSEC) 40 MG capsule TAKE 1 CAPSULE (40 MG TOTAL) BY MOUTH DAILY.   ondansetron  (ZOFRAN ) 4 MG tablet Take 4 mg by mouth every 8 (eight) hours as needed for nausea/vomiting.   oxyCODONE -acetaminophen  (PERCOCET/ROXICET) 5-325 MG tablet Take 1 tablet by mouth every 4 (four) hours as needed for severe pain.   pregabalin  (LYRICA ) 100 MG capsule Take 100 mg by mouth 2 (two) times daily.   spironolactone  (ALDACTONE ) 50 MG tablet Take 0.5 tablets (25 mg total) by mouth daily.   tamsulosin  (FLOMAX ) 0.4 MG CAPS capsule TAKE 1 CAPSULE BY MOUTH EVERY DAY   Vitamin D , Ergocalciferol , (DRISDOL) 1.25 MG (50000 UNIT) CAPS capsule TAKE 1 CAPSULE BY MOUTH ONCE WEEKLY   [DISCONTINUED] albuterol  (VENTOLIN  HFA) 108 (90 Base) MCG/ACT inhaler Inhale 2 puffs into the lungs every 6 (six) hours as needed for wheezing or shortness of  breath.   No facility-administered medications prior to visit.    Review of Systems  Constitutional: Negative.   HENT:  Positive for congestion, postnasal drip, rhinorrhea, sinus pain and sore throat.   Eyes: Negative.   Respiratory:  Positive for cough and sputum production. Negative for shortness of breath.   Cardiovascular: Negative.  Negative for chest pain.  Gastrointestinal: Negative.  Negative for abdominal pain, constipation and diarrhea.  Genitourinary: Negative.   Musculoskeletal:  Negative for joint pain and myalgias.  Skin: Negative.   Neurological: Negative.  Negative for dizziness and headaches.  Endo/Heme/Allergies: Negative.   All other systems reviewed and are negative.      Objective:   BP 138/86   Pulse 97   Ht 5' 9 (1.753 m)   Wt (!) 317 lb 4.8 oz (143.9 kg)   SpO2 (!) 65%   BMI 46.86 kg/m   Vitals:   06/05/23 1326  BP: 138/86  Pulse: 97  Height: 5' 9 (1.753 m)  Weight: (!) 317 lb 4.8 oz (143.9 kg)  SpO2: (!) 65%  BMI (Calculated): 46.84    Physical Exam Nursing note reviewed.  Constitutional:      Appearance: Normal appearance. He is normal weight.  HENT:     Head: Normocephalic and atraumatic.     Nose: Nose normal.     Mouth/Throat:     Mouth: Mucous membranes are moist.     Pharynx: Oropharynx is clear.  Eyes:     Extraocular Movements: Extraocular movements intact.     Conjunctiva/sclera: Conjunctivae normal.     Pupils: Pupils are equal, round, and reactive to light.  Cardiovascular:     Rate and Rhythm: Normal rate and regular rhythm.     Pulses: Normal pulses.     Heart sounds: Normal heart sounds.  Pulmonary:     Effort: Pulmonary effort is normal.     Breath sounds: Normal breath sounds.  Abdominal:     General: Abdomen is flat. Bowel sounds are normal.     Palpations: Abdomen is soft.  Musculoskeletal:  General: Normal range of motion.     Cervical back: Normal range of motion.  Skin:    General: Skin is warm  and dry.  Neurological:     General: No focal deficit present.     Mental Status: He is alert and oriented to person, place, and time.  Psychiatric:        Mood and Affect: Mood normal.        Behavior: Behavior normal.        Thought Content: Thought content normal.        Judgment: Judgment normal.      No results found for any visits on 06/05/23.  Recent Results (from the past 2160 hours)  POCT CBG (Fasting - Glucose)     Status: None   Collection Time: 04/04/23  1:40 PM  Result Value Ref Range   Glucose Fasting, POC 74 70 - 99 mg/dL      Assessment & Plan:  Augmentin  Flonase  Use inhalers Continue Mucinex , Singulair , Zyrtec  Tessalon  Pearls Increase fluid intake   Problem List Items Addressed This Visit       Respiratory   Acute URI - Primary    Return if symptoms worsen or fail to improve.   Total time spent: 25 minutes  [employer], NP  06/05/2023   This document may have been prepared by Dragon Voice Recognition software and as such may include unintentional dictation errors.

## 2023-06-06 DIAGNOSIS — E538 Deficiency of other specified B group vitamins: Secondary | ICD-10-CM

## 2023-06-06 MED ORDER — CYANOCOBALAMIN 1000 MCG/ML IJ SOLN
1000.0000 ug | Freq: Once | INTRAMUSCULAR | Status: AC
  Administered 2023-06-06: 1000 ug via INTRAMUSCULAR

## 2023-06-07 ENCOUNTER — Other Ambulatory Visit: Payer: Self-pay | Admitting: Cardiology

## 2023-06-26 NOTE — Progress Notes (Signed)
B12 injection given to patient.

## 2023-06-29 ENCOUNTER — Other Ambulatory Visit: Payer: Self-pay | Admitting: Internal Medicine

## 2023-06-29 ENCOUNTER — Other Ambulatory Visit: Payer: Self-pay | Admitting: Family

## 2023-06-29 ENCOUNTER — Other Ambulatory Visit: Payer: Self-pay | Admitting: Nurse Practitioner

## 2023-06-29 DIAGNOSIS — E559 Vitamin D deficiency, unspecified: Secondary | ICD-10-CM

## 2023-07-14 ENCOUNTER — Ambulatory Visit: Payer: Medicare Other

## 2023-07-30 ENCOUNTER — Encounter: Payer: Self-pay | Admitting: Cardiology

## 2023-07-30 ENCOUNTER — Ambulatory Visit: Payer: Medicare Other | Attending: Cardiology | Admitting: Cardiology

## 2023-07-30 VITALS — BP 111/77 | HR 71 | Ht 70.0 in | Wt 315.1 lb

## 2023-07-30 DIAGNOSIS — Z79899 Other long term (current) drug therapy: Secondary | ICD-10-CM

## 2023-07-30 DIAGNOSIS — I471 Supraventricular tachycardia, unspecified: Secondary | ICD-10-CM | POA: Diagnosis not present

## 2023-07-30 DIAGNOSIS — I10 Essential (primary) hypertension: Secondary | ICD-10-CM | POA: Diagnosis not present

## 2023-07-30 NOTE — Progress Notes (Signed)
  Electrophysiology Office Follow up Visit Note:    Date:  07/30/2023   ID:  Aaron [name], DOB January 07, 1966, MRN [id number]  PCP:  Miki Kins, FNP  [facility] Cardiologist:  Yvonne Kendall, MD  [facility] Electrophysiologist:  Lanier Prude, MD    Interval History:     Aaron [name] is a 58 y.o. male who presents for a follow up visit.   I last saw the patient in December 2023 for her supraventricular tachycardia managed with flecainide twice daily. He is doing well. Stable arrhythmia burden on flecainide. Has taken short acting metoprolol 3 times in past few months. Overall he is happy with symptom burden. Meals are still a trigger.       Past medical, surgical, social and family history were reviewed.  ROS:   Please see the history of present illness.    All other systems reviewed and are negative.  EKGs/Labs/Other Studies Reviewed:    The following studies were reviewed today:  April 10, 2023 EKG shows PR 262 ms and QRS duration 96 ms.  Sinus rhythm.   EKG Interpretation Date/Time:  Wednesday July 30 2023 14:21:57 EST Ventricular Rate:  71 PR Interval:  314 QRS Duration:  94 QT Interval:  366 QTC Calculation: 397 R Axis:   -27  Text Interpretation: Sinus rhythm with 1st degree A-V block with Premature atrial complexes Confirmed by Steffanie Dunn [phone]) on 07/30/2023 2:35:50 PM    Physical Exam:    VS:  BP 111/77 (BP Location: Left Arm, Patient Position: Sitting, Cuff Size: Large)   Pulse 71   Ht 5\' 10"  (1.778 m)   Wt (!) 315 lb 2.2 oz (142.9 kg)   SpO2 95%   BMI 45.22 kg/m     Wt Readings from Last 3 Encounters:  07/30/23 (!) 315 lb 2.2 oz (142.9 kg)  06/05/23 (!) 317 lb 4.8 oz (143.9 kg)  05/15/23 (!) 315 lb 9.6 oz (143.2 kg)     GEN: no distress. Obese. CARD: RRR, No MRG RESP: No IWOB. CTAB.      ASSESSMENT:    1. PSVT (paroxysmal supraventricular tachycardia) (HCC)   2. Morbid obesity (HCC)   3. Encounter  for long-term (current) use of high-risk medication   4. Primary hypertension    PLAN:    In order of problems listed above:  #PSVT #High risk med-flecainide The patient is doing well on flecainide and metoprolol.  PR and QRS have remained stable. Continue this regimen  #Obesity Weight loss encouraged  #Hypertension At goal today.  Recommend checking blood pressures 1-2 times per week at home and recording the values.  Recommend bringing these recordings to the primary care physician.  Follow up 6 months with APP.   Signed, Steffanie Dunn, MD, [facility], [facility] 07/30/2023 2:36 PM    Electrophysiology [facility]

## 2023-07-30 NOTE — Patient Instructions (Signed)
 Medication Instructions:  Your physician recommends that you continue on your current medications as directed. Please refer to the Current Medication list given to you today.  *If you need a refill on your cardiac medications before your next appointment, please call your pharmacy*  Follow-Up: At [facility], you and your health needs are our priority.  As part of our continuing mission to provide you with exceptional heart care, we have created designated Provider Care Teams.  These Care Teams include your primary Cardiologist (physician) and Advanced Practice Providers (APPs -  Physician Assistants and Nurse Practitioners) who all work together to provide you with the care you need, when you need it.   Your next appointment:   6 months  Provider:   Sherie Don, NP

## 2023-08-13 ENCOUNTER — Ambulatory Visit: Payer: Medicare Other | Admitting: Internal Medicine

## 2023-09-14 ENCOUNTER — Other Ambulatory Visit: Payer: Self-pay | Admitting: Cardiology

## 2023-09-15 ENCOUNTER — Other Ambulatory Visit: Payer: Self-pay | Admitting: Family

## 2023-09-16 ENCOUNTER — Other Ambulatory Visit: Payer: Self-pay | Admitting: Cardiology

## 2023-09-28 ENCOUNTER — Other Ambulatory Visit: Payer: Self-pay | Admitting: Internal Medicine

## 2023-10-05 ENCOUNTER — Other Ambulatory Visit: Payer: Self-pay | Admitting: Internal Medicine

## 2023-10-08 ENCOUNTER — Ambulatory Visit (INDEPENDENT_AMBULATORY_CARE_PROVIDER_SITE_OTHER): Admitting: Family

## 2023-10-08 DIAGNOSIS — E538 Deficiency of other specified B group vitamins: Secondary | ICD-10-CM

## 2023-10-08 MED ORDER — CELECOXIB 100 MG PO CAPS: 100.0000 mg | ORAL_CAPSULE | Freq: Two times a day (BID) | ORAL | 1 refills | Status: DC

## 2023-10-08 MED ORDER — CYANOCOBALAMIN 1000 MCG/ML IJ SOLN
1000.0000 ug | Freq: Once | INTRAMUSCULAR | Status: AC
  Administered 2023-10-08: 1000 ug via INTRAMUSCULAR

## 2023-10-14 NOTE — Patient Instructions (Signed)
B12 given to pt

## 2023-10-19 ENCOUNTER — Encounter: Payer: Self-pay | Admitting: Family

## 2023-10-19 NOTE — Progress Notes (Signed)
   CHIEF COMPLAINT  B12 Shot     REASON FOR VISIT  B12 Injection     ASSESSMENT  B12 Deficiency, Unspecified     PLAN  Diagnoses and all orders for this visit:  Vitamin B12 deficiency -     cyanocobalamin  (VITAMIN B12) injection 1,000 mcg  Other orders -     celecoxib  (CELEBREX ) 100 MG capsule; Take 1 capsule (100 mg total) by mouth 2 (two) times daily.     Pt. given B12 injection in clinic.  Return for next injection per provider instructions.   Total time spent: 5 minutes  Trenda Frisk, FNP  10/08/2023

## 2023-10-30 ENCOUNTER — Other Ambulatory Visit: Payer: Self-pay | Admitting: Family

## 2023-11-07 ENCOUNTER — Ambulatory Visit

## 2023-11-13 ENCOUNTER — Ambulatory Visit: Payer: Self-pay | Admitting: Urology

## 2023-11-18 ENCOUNTER — Encounter: Payer: Self-pay | Admitting: Family

## 2023-11-18 ENCOUNTER — Ambulatory Visit: Admitting: Family

## 2023-11-18 ENCOUNTER — Ambulatory Visit (INDEPENDENT_AMBULATORY_CARE_PROVIDER_SITE_OTHER)

## 2023-11-18 VITALS — BP 108/80 | HR 73 | Ht 69.0 in | Wt 319.2 lb

## 2023-11-18 DIAGNOSIS — E1165 Type 2 diabetes mellitus with hyperglycemia: Secondary | ICD-10-CM | POA: Diagnosis not present

## 2023-11-18 DIAGNOSIS — E538 Deficiency of other specified B group vitamins: Secondary | ICD-10-CM

## 2023-11-18 DIAGNOSIS — R0602 Shortness of breath: Secondary | ICD-10-CM

## 2023-11-18 DIAGNOSIS — Z0130 Encounter for examination of blood pressure without abnormal findings: Secondary | ICD-10-CM

## 2023-11-18 DIAGNOSIS — F900 Attention-deficit hyperactivity disorder, predominantly inattentive type: Secondary | ICD-10-CM

## 2023-11-18 DIAGNOSIS — J449 Chronic obstructive pulmonary disease, unspecified: Secondary | ICD-10-CM

## 2023-11-18 DIAGNOSIS — I5032 Chronic diastolic (congestive) heart failure: Secondary | ICD-10-CM | POA: Diagnosis not present

## 2023-11-18 LAB — POC CREATINE & ALBUMIN,URINE
Albumin/Creatinine Ratio, Urine, POC: <30
Creatinine, POC: 200 mg/dL
Microalbumin Ur, POC: 30 mg/L

## 2023-11-18 MED ORDER — BUPROPION HCL ER (XL) 150 MG PO TB24: 150.0000 mg | ORAL_TABLET | Freq: Every day | ORAL | 1 refills | Status: AC

## 2023-11-18 MED ORDER — CYANOCOBALAMIN 1000 MCG/ML IJ SOLN
1000.0000 ug | Freq: Once | INTRAMUSCULAR | Status: AC
  Administered 2023-11-18: 1000 ug via INTRAMUSCULAR

## 2023-11-18 NOTE — Progress Notes (Signed)
 Established Patient Office Visit  Subjective:  Patient ID: Aaron [name], male    DOB: [birth year]  Age: 58 y.o. MRN: [id number]  Chief Complaint  Patient presents with   Follow-up    5 month follow up, complete paperwork    Patient is here today for his 5 months follow up.  He has been feeling fairly well since last appointment.   He does have additional concerns to discuss today.  1) Needs B12 injection 2) He has paperwork to complete for his disability for work.  3) Has been having some cough, SOB.  4) asks if there are meds we can give him to help with his ADHD. Has been on adderall before, but is open to trying other things.   Labs are not due today.  He needs refills.   I have reviewed his active problem list, medication list, allergies, notes from last encounter, lab results for his appointment today.      No other concerns at this time.   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 06/2020 LV gram: EF 35-45%; b. 11/2021 Echo: EF 55%, no rwma, GrI DD, nl RV fxn, mild MR. ao root 42mm.   Cough 07/07/2020   Crohn's disease (HCC)    Elevated troponin 05/18/2020   Heart murmur    Hypertension    Hypokalemia 05/18/2020   Kidney stone    MDD (major depressive disorder)    NICM (nonischemic cardiomyopathy) (HCC)    a. 06/2020 LV gram: EF 35-45%; b. 11/2021 Echo: EF 55%, GrI DD.   Nonobstructive CAD (coronary artery disease)    a. 10/2011 Stress MV: Equivocal. EF 68%, no ischemia; b. 06/2020 Cath: LM nl, LAD 51m, LCX large, mild diff dzs, RCA mild diff dzs. EF 35-45%; c. 11/2021 ETT: Ex time 3:48. No acute ST/T changes. Max HR 105.   Obesity    OSA (obstructive sleep apnea)    Palpitations 03/31/2020   Pneumonia due to  COVID-19 virus 05/11/2020   Premature atrial contractions    a 06/2019 Zio: Predom RSR. Freq PACs (13% burden).   PSVT (paroxysmal supraventricular tachycardia) (HCC)    Shortness of breath 03/31/2020    Past Surgical History:  Procedure Laterality Date   CARDIAC CATHETERIZATION     COLONOSCOPY N/A 06/14/2021   Procedure: COLONOSCOPY;  Surgeon: Onita Elspeth Sharper, DO;  Location: [facility] ENDOSCOPY;  Service: Gastroenterology;  Laterality: N/A;   ESOPHAGOGASTRODUODENOSCOPY N/A 06/14/2021   Procedure: ESOPHAGOGASTRODUODENOSCOPY (EGD);  Surgeon: Onita Elspeth Sharper, DO;  Location: [facility] ENDOSCOPY;  Service: Gastroenterology;  Laterality: N/A;   KIDNEY STONE SURGERY     LEFT HEART CATH AND CORONARY ANGIOGRAPHY Left 06/27/2020   Procedure: LEFT HEART CATH AND CORONARY ANGIOGRAPHY;  Surgeon: Mady Bruckner, MD;  Location: [facility] INVASIVE CV LAB;  Service: Cardiovascular;  Laterality: Left;   VASECTOMY      Social History   Socioeconomic History   Marital status: Widowed    Spouse name: carisa   Number of children: 2   Years of education: Not on file   Highest education level: High school graduate  Occupational History   Not on file  Tobacco Use   Smoking status: Never   Smokeless tobacco: Never  Vaping Use   Vaping status: Never Used  Substance and Sexual Activity   Alcohol use: Not Currently   Drug use: Never   Sexual activity: Not Currently  Other Topics Concern   Not on file  Social History Narrative   Not on file   Social Drivers of Health   Financial Resource Strain: Low Risk  (09/02/2018)   Overall Financial Resource Strain (CARDIA)    Difficulty of Paying Living Expenses: Not hard at all  Food Insecurity: No Food Insecurity (09/02/2018)   Hunger Vital Sign    Worried About Running Out of Food in the Last Year: Never true    Ran Out of Food in the Last Year: Never true  Transportation Needs: No Transportation Needs (09/02/2018)   PRAPARE - Scientist, research (physical sciences) (Medical): No    Lack of Transportation (Non-Medical): No  Physical Activity: Insufficiently Active (09/02/2018)   Exercise Vital Sign    Days of Exercise per Week: 2 days    Minutes of Exercise per Session: 30 min  Stress: Stress Concern Present (09/02/2018)   [employer] of Occupational Health - Occupational Stress Questionnaire    Feeling of Stress : Very much  Social Connections: Unknown (09/02/2018)   Social Connection and Isolation Panel    Frequency of Communication with Friends and Family: Not on file    Frequency of Social Gatherings with Friends and Family: Not on file    Attends Religious Services: Never    Active Member of Clubs or Organizations: No    Attends Banker Meetings: Never    Marital Status: Married  Catering manager Violence: Not At Risk (09/02/2018)   Humiliation, Afraid, Rape, and Kick questionnaire    Fear of Current or Ex-Partner: No    Emotionally Abused: No    Physically Abused: No    Sexually Abused: No    Family History  Problem Relation Age of Onset   COPD Mother    Cancer Mother    Hypertension Father    Congestive Heart Failure Father    COPD Father    Lung cancer Father    Heart attack Father 61   Hypertension Sister    Diabetes Paternal Grandfather    Heart attack Paternal Grandfather    Healthy Son    Healthy Daughter     No Known Allergies  Review of Systems  Respiratory:  Positive for shortness of breath and wheezing.   All other systems reviewed and are negative.      Objective:   BP 108/80   Pulse 73   Ht 5' 9 (1.753 m)   Wt (!) 319 lb 3.2 oz (144.8 kg)   SpO2 98%   BMI 47.14 kg/m   Vitals:   11/18/23 1119  BP: 108/80  Pulse: 73  Height: 5' 9 (1.753 m)  Weight: (!) 319 lb 3.2 oz (144.8 kg)  SpO2: 98%  BMI (Calculated): 47.12    Physical Exam Vitals and nursing note reviewed.  Constitutional:      Appearance: Normal appearance. He is normal weight.  Eyes:     Pupils: Pupils  are equal, round, and reactive to light.  Cardiovascular:     Rate and Rhythm: Normal rate and regular rhythm.     Pulses: Normal pulses.     Heart sounds: Normal heart sounds.  Pulmonary:     Effort: Pulmonary effort is normal.     Breath sounds: Normal breath sounds.  Neurological:  General: No focal deficit present.     Mental Status: He is alert and oriented to person, place, and time.  Psychiatric:        Attention and Perception: He is inattentive.        Mood and Affect: Mood and affect normal.        Speech: Speech normal.        Behavior: Behavior normal. Behavior is cooperative.        Thought Content: Thought content normal.        Cognition and Memory: Cognition and memory normal.        Judgment: Judgment normal.      No results found for any visits on 11/18/23.  No results found for this or any previous visit (from the past 2160 hours).     Assessment & Plan Chronic obstructive pulmonary disease, unspecified COPD type (HCC) Heart failure with improved ejection fraction (HFimpEF) (HCC) Shortness of breath Overall stable. Will get CXR today to eval for possible infection. Will call with results when available.   Type 2 diabetes mellitus with hyperglycemia, without long-term current use of insulin (HCC) Checking labs today. Will call pt. With results  Continue current diabetes POC, as patient has been well controlled on current regimen.  Will adjust meds if needed based on labs.   Vitamin B12 deficiency B12 injection given in office today.  Return for next injection per provider instructions.  Attention deficit hyperactivity disorder (ADHD), predominantly inattentive type Sending wellbutrin  to try for symptoms.  Will reassess at follow up.    No follow-ups on file.   Total time spent: 30 minutes  ALAN CHRISTELLA ARRANT, FNP  11/18/2023   This document may have been prepared by [facility] Voice Recognition software and as such may include unintentional  dictation errors.

## 2023-11-19 ENCOUNTER — Telehealth: Payer: Self-pay | Admitting: Family

## 2023-11-19 NOTE — Telephone Encounter (Signed)
 PT called wanting to know if you could call anything in for his severe dry cough & chest congestion?  Pt states its worse in the morning

## 2023-11-20 ENCOUNTER — Ambulatory Visit: Payer: Self-pay

## 2023-11-21 ENCOUNTER — Other Ambulatory Visit: Payer: Self-pay

## 2023-11-21 MED ORDER — HYDROCOD POLI-CHLORPHE POLI ER 10-8 MG/5ML PO SUER: 5.0000 mL | Freq: Two times a day (BID) | ORAL | 0 refills | Status: DC | PRN

## 2023-11-21 NOTE — Telephone Encounter (Signed)
 Pended the cough medication to the pcp

## 2023-11-22 ENCOUNTER — Other Ambulatory Visit: Payer: Self-pay | Admitting: Family

## 2023-11-26 ENCOUNTER — Other Ambulatory Visit: Payer: Self-pay | Admitting: Cardiology

## 2023-11-26 ENCOUNTER — Telehealth: Payer: Self-pay | Admitting: Family

## 2023-11-26 NOTE — Telephone Encounter (Signed)
 Patient called stating that you started him on new medication and now he can't get pain meds filled until they speak with you in regards to interaction between the 2 medications.

## 2023-11-26 NOTE — Telephone Encounter (Signed)
 Please let the pharmacy know that he should just NOT take the lomotil , and let him know that too.

## 2023-11-28 NOTE — Telephone Encounter (Signed)
 Patient is calling stating he is only taking 1/2 tablet daily of prescription, but wanted to check on the status of prescription due to the pharmacy advising they had not heard back.

## 2023-12-03 ENCOUNTER — Other Ambulatory Visit: Payer: Self-pay | Admitting: Family

## 2023-12-03 ENCOUNTER — Telehealth: Payer: Self-pay

## 2023-12-03 NOTE — Telephone Encounter (Signed)
 Pt called regarding rx Wellbutrin , he had to stop taking due to side effects. He said rx made him very agitated/ill, he asked if you can send different rx for him? Please advise

## 2023-12-16 ENCOUNTER — Ambulatory Visit: Admitting: Urology

## 2023-12-18 ENCOUNTER — Ambulatory Visit (INDEPENDENT_AMBULATORY_CARE_PROVIDER_SITE_OTHER): Admitting: Family

## 2023-12-18 ENCOUNTER — Encounter: Payer: Self-pay | Admitting: Family

## 2023-12-18 DIAGNOSIS — E538 Deficiency of other specified B group vitamins: Secondary | ICD-10-CM

## 2023-12-18 MED ORDER — CYANOCOBALAMIN 1000 MCG/ML IJ SOLN
1000.0000 ug | Freq: Once | INTRAMUSCULAR | Status: AC
  Administered 2023-12-18: 1000 ug via INTRAMUSCULAR

## 2023-12-18 NOTE — Progress Notes (Signed)
   CHIEF COMPLAINT  B12 Shot     REASON FOR VISIT  B12 Injection     ASSESSMENT  B12 Deficiency, Unspecified     PLAN  Diagnoses and all orders for this visit:  Vitamin B12 deficiency -     cyanocobalamin  (VITAMIN B12) injection 1,000 mcg     Pt. given B12 injection in clinic.  Return for next injection per provider instructions.   Total time spent: 20 minutes  ALAN CHRISTELLA ARRANT, FNP  12/18/2023

## 2023-12-23 ENCOUNTER — Other Ambulatory Visit: Payer: Self-pay | Admitting: Family

## 2023-12-23 DIAGNOSIS — H6993 Unspecified Eustachian tube disorder, bilateral: Secondary | ICD-10-CM

## 2023-12-27 ENCOUNTER — Other Ambulatory Visit: Payer: Self-pay | Admitting: Cardiology

## 2023-12-31 ENCOUNTER — Other Ambulatory Visit: Payer: Self-pay | Admitting: Urology

## 2023-12-31 ENCOUNTER — Ambulatory Visit: Admitting: Family

## 2023-12-31 ENCOUNTER — Encounter: Payer: Self-pay | Admitting: Family

## 2023-12-31 VITALS — BP 117/82 | HR 72 | Ht 69.0 in | Wt 319.7 lb

## 2023-12-31 DIAGNOSIS — F900 Attention-deficit hyperactivity disorder, predominantly inattentive type: Secondary | ICD-10-CM | POA: Diagnosis not present

## 2023-12-31 DIAGNOSIS — Z0130 Encounter for examination of blood pressure without abnormal findings: Secondary | ICD-10-CM

## 2023-12-31 DIAGNOSIS — E1165 Type 2 diabetes mellitus with hyperglycemia: Secondary | ICD-10-CM

## 2023-12-31 DIAGNOSIS — J449 Chronic obstructive pulmonary disease, unspecified: Secondary | ICD-10-CM

## 2023-12-31 DIAGNOSIS — N400 Enlarged prostate without lower urinary tract symptoms: Secondary | ICD-10-CM

## 2023-12-31 DIAGNOSIS — I5032 Chronic diastolic (congestive) heart failure: Secondary | ICD-10-CM

## 2023-12-31 MED ORDER — AMPHETAMINE-DEXTROAMPHETAMINE 10 MG PO TABS
10.0000 mg | ORAL_TABLET | Freq: Two times a day (BID) | ORAL | 0 refills | Status: DC
Start: 2023-12-31 — End: 2024-02-18

## 2023-12-31 MED ORDER — DIPHENOXYLATE-ATROPINE 2.5-0.025 MG PO TABS: 1.0000 | ORAL_TABLET | Freq: Four times a day (QID) | ORAL | 1 refills | Status: DC | PRN

## 2023-12-31 MED ORDER — AMPHETAMINE-DEXTROAMPHETAMINE 10 MG PO TABS
10.0000 mg | ORAL_TABLET | Freq: Two times a day (BID) | ORAL | 0 refills | Status: AC
Start: 2024-01-30 — End: ?

## 2023-12-31 MED ORDER — TAMSULOSIN HCL 0.4 MG PO CAPS: 0.4000 mg | ORAL_CAPSULE | Freq: Every day | ORAL | 1 refills | Status: DC

## 2023-12-31 MED ORDER — TADALAFIL 5 MG PO TABS: 5.0000 mg | ORAL_TABLET | Freq: Every day | ORAL | 0 refills | Status: DC

## 2023-12-31 NOTE — Assessment & Plan Note (Signed)
 Continue current diabetes POC, as patient has been well controlled on current regimen.  Will adjust meds if needed based on labs.

## 2023-12-31 NOTE — Assessment & Plan Note (Signed)
 Patient is seen by pulmonary, who manage this condition.  He is well controlled with current therapy.   Will defer to them for further changes to plan of care.

## 2023-12-31 NOTE — Assessment & Plan Note (Signed)
 Sending RX for pt for adderall.  Will reassess at follow up for effectiveness.

## 2023-12-31 NOTE — Progress Notes (Signed)
 Established Patient Office Visit  Subjective:  Patient ID: Aaron [name], male    DOB: [birth year]  Age: 58 y.o. MRN: [id number]  Chief Complaint  Patient presents with   Follow-up    Discuss meds    Patient is here today with a few concerns: 1) The bupropion  did not help with his symptoms, so he asks if we can try the adderall, as he has been on this previously.  2) Asks if we can restart the lomotil   3) Needs a refill on his flomax .     No other concerns at this time.   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 06/2020 LV gram: EF 35-45%; b. 11/2021 Echo: EF 55%, no rwma, GrI DD, nl RV fxn, mild MR. ao root 42mm.   Cough 07/07/2020   Crohn's disease (HCC)    Elevated troponin 05/18/2020   Heart murmur    Hypertension    Hypokalemia 05/18/2020   Kidney stone    MDD (major depressive disorder)    NICM (nonischemic cardiomyopathy) (HCC)    a. 06/2020 LV gram: EF 35-45%; b. 11/2021 Echo: EF 55%, GrI DD.   Nonobstructive CAD (coronary artery disease)    a. 10/2011 Stress MV: Equivocal. EF 68%, no ischemia; b. 06/2020 Cath: LM nl, LAD 12m, LCX large, mild diff dzs, RCA mild diff dzs. EF 35-45%; c. 11/2021 ETT: Ex time 3:48. No acute ST/T changes. Max HR 105.   Obesity    OSA (obstructive sleep apnea)    Palpitations 03/31/2020   Pneumonia due to COVID-19 virus 05/11/2020   Premature atrial contractions    a 06/2019 Zio: Predom RSR. Freq PACs (13% burden).   PSVT (paroxysmal supraventricular tachycardia) (HCC)    Shortness of breath 03/31/2020    Past Surgical History:  Procedure Laterality Date   CARDIAC CATHETERIZATION     COLONOSCOPY N/A 06/14/2021   Procedure: COLONOSCOPY;  Surgeon: Onita Elspeth Sharper, DO;  Location:  [facility] ENDOSCOPY;  Service: Gastroenterology;  Laterality: N/A;   ESOPHAGOGASTRODUODENOSCOPY N/A 06/14/2021   Procedure: ESOPHAGOGASTRODUODENOSCOPY (EGD);  Surgeon: Onita Elspeth Sharper, DO;  Location: [facility] ENDOSCOPY;  Service: Gastroenterology;  Laterality: N/A;   KIDNEY STONE SURGERY     LEFT HEART CATH AND CORONARY ANGIOGRAPHY Left 06/27/2020   Procedure: LEFT HEART CATH AND CORONARY ANGIOGRAPHY;  Surgeon: Mady Bruckner, MD;  Location: [facility] INVASIVE CV LAB;  Service: Cardiovascular;  Laterality: Left;   VASECTOMY      Social History   Socioeconomic History   Marital status: Widowed    Spouse name: carisa   Number of children: 2   Years of education: Not on file   Highest education level: High school graduate  Occupational History   Not on file  Tobacco Use   Smoking status: Never   Smokeless tobacco: Never  Vaping Use   Vaping status: Never Used  Substance and Sexual Activity   Alcohol use: Not Currently   Drug use: Never   Sexual activity: Not Currently  Other Topics Concern   Not on file  Social History Narrative   Not on file   Social Drivers of Health   Financial Resource Strain: Low Risk  (09/02/2018)   Overall Physicist, medical Strain (  CARDIA)    Difficulty of Paying Living Expenses: Not hard at all  Food Insecurity: No Food Insecurity (09/02/2018)   Hunger Vital Sign    Worried About Running Out of Food in the Last Year: Never true    Ran Out of Food in the Last Year: Never true  Transportation Needs: No Transportation Needs (09/02/2018)   PRAPARE - Administrator, Civil Service (Medical): No    Lack of Transportation (Non-Medical): No  Physical Activity: Insufficiently Active (09/02/2018)   Exercise Vital Sign    Days of Exercise per Week: 2 days    Minutes of Exercise per Session: 30 min  Stress: Stress Concern Present (09/02/2018)   [employer] of Occupational Health - Occupational Stress Questionnaire    Feeling of Stress : Very much  Social  Connections: Unknown (09/02/2018)   Social Connection and Isolation Panel    Frequency of Communication with Friends and Family: Not on file    Frequency of Social Gatherings with Friends and Family: Not on file    Attends Religious Services: Never    Active Member of Clubs or Organizations: No    Attends Banker Meetings: Never    Marital Status: Married  Catering manager Violence: Not At Risk (09/02/2018)   Humiliation, Afraid, Rape, and Kick questionnaire    Fear of Current or Ex-Partner: No    Emotionally Abused: No    Physically Abused: No    Sexually Abused: No    Family History  Problem Relation Age of Onset   COPD Mother    Cancer Mother    Hypertension Father    Congestive Heart Failure Father    COPD Father    Lung cancer Father    Heart attack Father 54   Hypertension Sister    Diabetes Paternal Grandfather    Heart attack Paternal Grandfather    Healthy Son    Healthy Daughter     No Known Allergies  Review of Systems  All other systems reviewed and are negative.      Objective:   BP 117/82   Pulse 72   Ht 5' 9 (1.753 m)   Wt (!) 319 lb 11.2 oz (145 kg)   SpO2 97%   BMI 47.21 kg/m   Vitals:   12/31/23 1312  BP: 117/82  Pulse: 72  Height: 5' 9 (1.753 m)  Weight: (!) 319 lb 11.2 oz (145 kg)  SpO2: 97%  BMI (Calculated): 47.19    Physical Exam Vitals and nursing note reviewed.  Constitutional:      Appearance: Normal appearance. He is normal weight.  Eyes:     Pupils: Pupils are equal, round, and reactive to light.  Cardiovascular:     Rate and Rhythm: Normal rate and regular rhythm.     Pulses: Normal pulses.     Heart sounds: Normal heart sounds.  Pulmonary:     Effort: Pulmonary effort is normal.     Breath sounds: Normal breath sounds.  Neurological:     General: No focal deficit present.     Mental Status: He is alert and oriented to person, place, and time.  Psychiatric:        Mood and Affect: Mood normal.         Behavior: Behavior normal.        Thought Content: Thought content normal.        Judgment: Judgment normal.      No results found for any visits on 12/31/23.  Recent Results (from the past 2160 hours)  POC CREATINE & ALBUMIN,URINE     Status: Normal   Collection Time: 11/18/23 11:57 AM  Result Value Ref Range   Microalbumin Ur, POC 30 mg/L   Creatinine, POC 200 mg/dL   Albumin/Creatinine Ratio, Urine, POC <30        Assessment & Plan Heart failure with improved ejection fraction (HFimpEF) (HCC) Patient is seen by cardiology, who manage this condition.  He is well controlled with current therapy.   Will defer to them for further changes to plan of care.  Chronic obstructive pulmonary disease, unspecified COPD type (HCC) Patient is seen by pulmonary, who manage this condition.  He is well controlled with current therapy.   Will defer to them for further changes to plan of care.  Type 2 diabetes mellitus with hyperglycemia, without long-term current use of insulin (HCC) Continue current diabetes POC, as patient has been well controlled on current regimen.  Will adjust meds if needed based on labs.   Attention deficit hyperactivity disorder (ADHD), predominantly inattentive type Sending RX for pt for adderall.  Will reassess at follow up for effectiveness.     Return in about 1 month (around 01/31/2024).   Total time spent: 20 minutes  ALAN CHRISTELLA ARRANT, FNP  12/31/2023   This document may have been prepared by [facility] Voice Recognition software and as such may include unintentional dictation errors.

## 2024-01-01 NOTE — Telephone Encounter (Signed)
 Per Alan verbal they discussed at his appt

## 2024-01-12 ENCOUNTER — Ambulatory Visit: Admitting: Physician Assistant

## 2024-01-14 NOTE — Assessment & Plan Note (Signed)
 B12 injection given in office today.  Return for next injection per provider instructions.

## 2024-01-14 NOTE — Assessment & Plan Note (Signed)
 Checking labs today. Will call pt. With results  Continue current diabetes POC, as patient has been well controlled on current regimen.  Will adjust meds if needed based on labs.

## 2024-01-14 NOTE — Assessment & Plan Note (Signed)
 Overall stable. Will get CXR today to eval for possible infection. Will call with results when available.

## 2024-01-14 NOTE — Assessment & Plan Note (Signed)
 Sending wellbutrin  to try for symptoms.  Will reassess at follow up.

## 2024-01-15 ENCOUNTER — Ambulatory Visit: Admitting: Physician Assistant

## 2024-01-19 ENCOUNTER — Ambulatory Visit (INDEPENDENT_AMBULATORY_CARE_PROVIDER_SITE_OTHER): Admitting: Internal Medicine

## 2024-01-19 ENCOUNTER — Encounter: Payer: Self-pay | Admitting: Internal Medicine

## 2024-01-19 DIAGNOSIS — E538 Deficiency of other specified B group vitamins: Secondary | ICD-10-CM

## 2024-01-19 MED ORDER — CYANOCOBALAMIN 1000 MCG/ML IJ SOLN
1000.0000 ug | Freq: Once | INTRAMUSCULAR | Status: AC
  Administered 2024-01-19: 1000 ug via INTRAMUSCULAR

## 2024-01-19 NOTE — Progress Notes (Signed)
 Vit B12 inj only-

## 2024-01-29 ENCOUNTER — Other Ambulatory Visit: Payer: Self-pay | Admitting: Family

## 2024-02-04 ENCOUNTER — Ambulatory Visit: Admitting: Family

## 2024-02-18 ENCOUNTER — Encounter: Payer: Self-pay | Admitting: Family

## 2024-02-18 ENCOUNTER — Ambulatory Visit: Admitting: Family

## 2024-02-18 VITALS — BP 122/78 | HR 68 | Ht 69.0 in | Wt 318.4 lb

## 2024-02-18 DIAGNOSIS — E785 Hyperlipidemia, unspecified: Secondary | ICD-10-CM

## 2024-02-18 DIAGNOSIS — E1159 Type 2 diabetes mellitus with other circulatory complications: Secondary | ICD-10-CM | POA: Insufficient documentation

## 2024-02-18 DIAGNOSIS — F988 Other specified behavioral and emotional disorders with onset usually occurring in childhood and adolescence: Secondary | ICD-10-CM | POA: Diagnosis not present

## 2024-02-18 DIAGNOSIS — J449 Chronic obstructive pulmonary disease, unspecified: Secondary | ICD-10-CM | POA: Diagnosis not present

## 2024-02-18 DIAGNOSIS — G4733 Obstructive sleep apnea (adult) (pediatric): Secondary | ICD-10-CM | POA: Diagnosis not present

## 2024-02-18 DIAGNOSIS — E1165 Type 2 diabetes mellitus with hyperglycemia: Secondary | ICD-10-CM

## 2024-02-18 DIAGNOSIS — I152 Hypertension secondary to endocrine disorders: Secondary | ICD-10-CM

## 2024-02-18 DIAGNOSIS — J329 Chronic sinusitis, unspecified: Secondary | ICD-10-CM | POA: Insufficient documentation

## 2024-02-18 DIAGNOSIS — E538 Deficiency of other specified B group vitamins: Secondary | ICD-10-CM | POA: Diagnosis not present

## 2024-02-18 DIAGNOSIS — Z6841 Body Mass Index (BMI) 40.0 and over, adult: Secondary | ICD-10-CM

## 2024-02-18 DIAGNOSIS — Z79899 Other long term (current) drug therapy: Secondary | ICD-10-CM | POA: Insufficient documentation

## 2024-02-18 DIAGNOSIS — E1169 Type 2 diabetes mellitus with other specified complication: Secondary | ICD-10-CM | POA: Insufficient documentation

## 2024-02-18 MED ORDER — MONTELUKAST SODIUM 10 MG PO TABS: 10.0000 mg | ORAL_TABLET | Freq: Every day | ORAL | 3 refills | Status: AC

## 2024-02-18 MED ORDER — ONDANSETRON 4 MG PO TBDP: 4.0000 mg | ORAL_TABLET | Freq: Three times a day (TID) | ORAL | 0 refills | Status: DC | PRN

## 2024-02-18 MED ORDER — CYANOCOBALAMIN 1000 MCG/ML IJ SOLN
1000.0000 ug | Freq: Once | INTRAMUSCULAR | Status: AC
  Administered 2024-02-18: 1000 ug via INTRAMUSCULAR

## 2024-02-18 MED ORDER — TADALAFIL 10 MG PO TABS: 10.0000 mg | ORAL_TABLET | Freq: Every day | ORAL | 0 refills | Status: DC | PRN

## 2024-02-18 MED ORDER — OMEPRAZOLE 40 MG PO CPDR: 40.0000 mg | DELAYED_RELEASE_CAPSULE | Freq: Every day | ORAL | 1 refills | Status: AC

## 2024-02-18 NOTE — Progress Notes (Signed)
 Established Patient Office Visit  Subjective   Patient ID: Aaron [name], male    DOB: March 17, 1966  Age: 58 y.o. MRN: [id number]  Chief Complaint  Patient presents with   Follow-up    1 month follow up    Patient is here today for medication follow up. He recently started Adderall and Cialis . He reports improvement in ADD symptoms since starting Adderall 10 mg. He states that the Cialis  has helped some but he is wondering if he can have a stronger dose to enhance sexual dysfunction. He denies chest pains, palpitations, shortness of breath.  He is also asking for refills for his Montelukast , Omeprazole , and Ondansetron .   He is due for labs as he has not had them done in a year. Will order routine blood work. Patient denies headache, abdominal pain, vomiting, diarrhea/constipation at this time.   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 06/2020 LV gram: EF 35-45%; b. 11/2021 Echo: EF 55%, no rwma, GrI DD, nl RV fxn, mild MR. ao root 42mm.   Cough 07/07/2020   Crohn's disease (HCC)    Elevated troponin 05/18/2020   Heart murmur    Hypertension    Hypokalemia 05/18/2020   Kidney stone    MDD (major depressive disorder)    NICM (nonischemic cardiomyopathy) (HCC)    a. 06/2020 LV gram: EF 35-45%; b. 11/2021 Echo: EF 55%, GrI DD.   Nonobstructive CAD (coronary artery disease)    a. 10/2011 Stress MV: Equivocal. EF 68%, no ischemia; b. 06/2020 Cath: LM nl, LAD 80m, LCX large, mild diff dzs, RCA mild diff dzs. EF 35-45%; c. 11/2021 ETT: Ex time 3:48. No acute ST/T changes. Max HR 105.   Obesity    OSA (obstructive sleep apnea)    Palpitations 03/31/2020   Pneumonia due to COVID-19 virus 05/11/2020   Premature atrial contractions    a  06/2019 Zio: Predom RSR. Freq PACs (13% burden).   PSVT (paroxysmal supraventricular tachycardia) (HCC)    Shortness of breath 03/31/2020     Review of Systems  Constitutional:  Negative for malaise/fatigue.  HENT: Negative.    Eyes:  Negative for blurred vision and pain.  Respiratory:  Negative for cough and shortness of breath.   Cardiovascular:  Negative for chest pain, palpitations, claudication and leg swelling.  Gastrointestinal:  Negative for abdominal pain, blood in stool, constipation, diarrhea, nausea and vomiting.  Genitourinary:  Negative for dysuria, frequency and urgency.       Erectile dysfunction  Musculoskeletal: Negative.   Skin: Negative.   Neurological:  Negative for dizziness, tingling, sensory change and headaches.  Endo/Heme/Allergies: Negative.   Psychiatric/Behavioral: Negative.        Objective:     BP 122/78   Pulse 68   Ht 5' 9 (1.753 m)   Wt (!) 318 lb 6.4 oz (144.4 kg)   SpO2 97%   BMI 47.02 kg/m    Physical Exam Vitals and nursing note reviewed.  Constitutional:      Appearance: Normal appearance.  HENT:     Head: Normocephalic.  Eyes:     Extraocular Movements: Extraocular movements intact.     Pupils: Pupils are equal, round, and reactive to light.  Cardiovascular:     Rate and Rhythm: Normal rate and regular rhythm.     Pulses: Normal pulses.     Heart sounds: Normal heart sounds. No murmur heard. Pulmonary:     Effort: Pulmonary effort is normal. No respiratory distress.     Breath sounds: Normal breath sounds.  Abdominal:     General: There is no distension.     Tenderness: There is no abdominal tenderness.  Musculoskeletal:        General: No tenderness. Normal range of motion.     Cervical back: Normal range of motion and neck supple.     Right lower leg: No edema.     Left lower leg: No edema.  Skin:    General: Skin is warm and dry.     Coloration: Skin is not jaundiced.     Findings: No erythema.  Neurological:      General: No focal deficit present.     Mental Status: He is alert and oriented to person, place, and time.  Psychiatric:        Mood and Affect: Mood normal.      No results found for any visits on 02/18/24.   The 10-year ASCVD risk score (Arnett DK, et al., 2019) is: 14.1%    Assessment & Plan:  Continue taking medications as prescribed. Refills sent for Montelukast , Omeprazole , and Ondansetron . Will increase Cialis  to 10 mg. Will collect routine blood work and urine drug screen. Problem List Items Addressed This Visit       Cardiovascular and Mediastinum   Hypertension associated with diabetes (HCC)   Relevant Medications   tadalafil  (CIALIS ) 10 MG tablet   Other Relevant Orders   CBC with Diff   CMP14+EGFR     Respiratory   Obstructive sleep apnea   Chronic obstructive pulmonary disease, unspecified COPD type (HCC) - Primary   Relevant Medications   montelukast  (SINGULAIR ) 10 MG tablet   Recurrent sinusitis     Endocrine   Type 2 diabetes mellitus with hyperglycemia (HCC)   Relevant Orders   CBC with Diff   CMP14+EGFR   Lipid Panel w/o Chol/HDL Ratio   Hemoglobin A1c   Hyperlipidemia associated with type 2 diabetes mellitus (HCC)   Relevant Medications   tadalafil  (CIALIS ) 10 MG tablet   Other Relevant Orders   CBC with Diff   CMP14+EGFR   Lipid Panel w/o Chol/HDL Ratio     Other   ADD (attention deficit disorder)   Morbid obesity with BMI of 45.0-49.9, adult (HCC)   Vitamin B12 deficiency   Long term current use of therapeutic drug   Relevant Orders   ToxASSURE Select 13 (MW), Urine    Return in about 3 months (around 05/19/2024).    Oddis DELENA Cain, FNP

## 2024-02-19 ENCOUNTER — Ambulatory Visit

## 2024-02-19 LAB — CBC WITH DIFFERENTIAL/PLATELET
Basophils Absolute: 0.1 x10E3/uL (ref 0.0–0.2)
Basos: 1 %
EOS (ABSOLUTE): 0.2 x10E3/uL (ref 0.0–0.4)
Eos: 2 %
Hematocrit: 47.1 % (ref 37.5–51.0)
Hemoglobin: 15.8 g/dL (ref 13.0–17.7)
Immature Grans (Abs): 0 x10E3/uL (ref 0.0–0.1)
Immature Granulocytes: 0 %
Lymphocytes Absolute: 3.2 x10E3/uL — ABNORMAL HIGH (ref 0.7–3.1)
Lymphs: 37 %
MCH: 30.7 pg (ref 26.6–33.0)
MCHC: 33.5 g/dL (ref 31.5–35.7)
MCV: 92 fL (ref 79–97)
Monocytes Absolute: 0.8 x10E3/uL (ref 0.1–0.9)
Monocytes: 9 %
Neutrophils Absolute: 4.6 x10E3/uL (ref 1.4–7.0)
Neutrophils: 51 %
Platelets: 381 x10E3/uL (ref 150–450)
RBC: 5.14 x10E6/uL (ref 4.14–5.80)
RDW: 11.9 % (ref 11.6–15.4)
WBC: 8.8 x10E3/uL (ref 3.4–10.8)

## 2024-02-19 LAB — CMP14+EGFR
ALT: 29 IU/L (ref 0–44)
AST: 27 IU/L (ref 0–40)
Albumin: 4.2 g/dL (ref 3.8–4.9)
Alkaline Phosphatase: 105 IU/L (ref 47–123)
BUN/Creatinine Ratio: 16 (ref 9–20)
BUN: 18 mg/dL (ref 6–24)
Bilirubin Total: <0.2 mg/dL (ref 0.0–1.2)
CO2: 20 mmol/L (ref 20–29)
Calcium: 9.4 mg/dL (ref 8.7–10.2)
Chloride: 103 mmol/L (ref 96–106)
Creatinine, Ser: 1.13 mg/dL (ref 0.76–1.27)
Globulin, Total: 3 g/dL (ref 1.5–4.5)
Glucose: 91 mg/dL (ref 70–99)
Potassium: 4.6 mmol/L (ref 3.5–5.2)
Sodium: 139 mmol/L (ref 134–144)
Total Protein: 7.2 g/dL (ref 6.0–8.5)
eGFR: 76 mL/min/1.73 (ref >59)

## 2024-02-19 LAB — HEMOGLOBIN A1C
Est. average glucose Bld gHb Est-mCnc: 131 mg/dL
Hgb A1c MFr Bld: 6.2 % — ABNORMAL HIGH (ref 4.8–5.6)

## 2024-02-19 LAB — LIPID PANEL W/O CHOL/HDL RATIO
Cholesterol, Total: 137 mg/dL (ref 100–199)
HDL: 29 mg/dL — ABNORMAL LOW (ref >39)
LDL Chol Calc (NIH): 77 mg/dL (ref 0–99)
Triglycerides: 184 mg/dL — ABNORMAL HIGH (ref 0–149)
VLDL Cholesterol Cal: 31 mg/dL (ref 5–40)

## 2024-02-23 LAB — TOXASSURE SELECT 13 (MW), URINE

## 2024-02-29 ENCOUNTER — Other Ambulatory Visit: Payer: Self-pay | Admitting: Cardiology

## 2024-03-04 ENCOUNTER — Other Ambulatory Visit: Payer: Self-pay | Admitting: Family

## 2024-03-17 ENCOUNTER — Other Ambulatory Visit: Payer: Self-pay

## 2024-03-17 ENCOUNTER — Telehealth: Payer: Self-pay | Admitting: Urology

## 2024-03-17 DIAGNOSIS — N200 Calculus of kidney: Secondary | ICD-10-CM

## 2024-03-17 NOTE — Telephone Encounter (Signed)
 When I called to advise patient to have KUB prior to his appointment on 03/18/24, he asked if he could have it done on [address] instead of the hospital. He said he didn't want to go in there with a lot of sick people. Please advise patient if order can be changed.

## 2024-03-18 ENCOUNTER — Ambulatory Visit: Admitting: Urology

## 2024-03-19 ENCOUNTER — Ambulatory Visit: Admitting: Family

## 2024-03-19 DIAGNOSIS — Z23 Encounter for immunization: Secondary | ICD-10-CM | POA: Diagnosis not present

## 2024-03-19 DIAGNOSIS — E538 Deficiency of other specified B group vitamins: Secondary | ICD-10-CM

## 2024-03-19 MED ORDER — TADALAFIL 10 MG PO TABS: 10.0000 mg | ORAL_TABLET | Freq: Every day | ORAL | 2 refills | Status: DC | PRN

## 2024-03-19 MED ORDER — CYANOCOBALAMIN 1000 MCG/ML IJ SOLN
1000.0000 ug | Freq: Once | INTRAMUSCULAR | Status: AC
  Administered 2024-03-19: 1000 ug via INTRAMUSCULAR

## 2024-03-20 NOTE — Progress Notes (Signed)
   CHIEF COMPLAINT  B12 Shot Flu vaccine   REASON FOR VISIT  B12 Injection Flu Vaccine    ASSESSMENT  B12 Deficiency, Unspecified Need for Flu Vaccine   PLAN  Diagnoses and all orders for this visit:  Vitamin B12 deficiency -     cyanocobalamin  (VITAMIN B12) injection 1,000 mcg  Flu vaccine need -     Influenza, MDCK, trivalent, PF(Flucelvax egg-free)  Other orders -     tadalafil  (CIALIS ) 10 MG tablet; Take 1 tablet (10 mg total) by mouth daily as needed for erectile dysfunction. -     amphetamine -dextroamphetamine  (ADDERALL) 10 MG tablet; Take 1 tablet (10 mg total) by mouth 2 (two) times daily with a meal.     Pt. given B12 injection in clinic.  Return for next injection per provider instructions.  Flu shot given in office today.  Pt encouraged to use supportive measures to manage side effects.  Refills sent.   Total time spent: 5 minutes  ALAN CHRISTELLA ARRANT, FNP  03/19/2024

## 2024-03-24 ENCOUNTER — Ambulatory Visit
Admission: RE | Admit: 2024-03-24 | Discharge: 2024-03-24 | Disposition: A | Source: Ambulatory Visit | Attending: Urology

## 2024-03-24 ENCOUNTER — Ambulatory Visit
Admission: RE | Admit: 2024-03-24 | Discharge: 2024-03-24 | Disposition: A | Discharge status: Home / Self Care | Attending: Urology | Admitting: Urology

## 2024-03-24 DIAGNOSIS — N200 Calculus of kidney: Secondary | ICD-10-CM | POA: Insufficient documentation

## 2024-03-24 NOTE — Progress Notes (Signed)
 03/28/24 11:05 PM   Aaron [name] [birth year] [id number]  Referring provider:  Orlean Alan HERO, FNP [address] [place],  [place] [address]   Urological history  1. BPH with LU TS -PSA 0.8 in 02/2021   2. Nephrolithiasis -surgery for kidney stones about 20 years ago -5 incidences of kidney stones   3. Intermediate Risk Hematuria -non-smoker -CT abdomen and pelvis noncontrast on 01/02/2021 that revealed an enlarged prostate gland measuring 5.0 cm in transverse diameter with focal calcifications. It also revealed 5 mm proximal left ureteral calculus without significant ureterectasis or pyelocaliectasis, a 5 mm nonobstructive right upper pole renal calculus and a 7.25 cm exophytic left upper pole renal cyst   4. Left renal cyst - seen on previous imaging, stable and benign; no further evaluation needed.  - MRI in 2019 for further characterization was benign   Chief Complaint  Patient presents with   Other   Follow-up    HPI: Aaron [name] is a 58 y.o.male who presents today for  follow-up with his wife, Aaron [name].   Previous records reviewed  About three weeks ago, he had the sudden onset of right flank pain to the right groin.  The pain has since abated.    KUB no stone seen.   I PSS 5/4  He reports sensation of incomplete bladder emptying, nocturia x 2, and post void dribbling.   Patient denies any modifying or aggravating factors.  Patient denies any recent UTI's, gross hematuria, dysuria or suprapubic/flank pain.  Patient denies any fevers, chills, nausea or vomiting.    Serum creatinine (02/2024) 1.13, eGFR 76  Hemoglobin A1c (02/2024) 6.2  Diuretics: furosemide  and spironolactone     Fluid consumption:  He drinks Diet Pepsi with meals and then green tea and water in between meals.   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 06/2020 LV gram: EF 35-45%; b. 11/2021 Echo: EF 55%, no rwma, GrI DD, nl RV fxn, mild MR. ao root 42mm.   Cough 07/07/2020   Crohn's disease (HCC)    Elevated troponin 05/18/2020   Heart murmur    Hypertension    Hypokalemia 05/18/2020   Kidney stone    MDD (major depressive disorder)    NICM (nonischemic cardiomyopathy) (HCC)    a. 06/2020 LV gram: EF 35-45%; b. 11/2021 Echo: EF 55%, GrI DD.   Nonobstructive CAD (coronary artery disease)    a. 10/2011 Stress MV: Equivocal. EF 68%, no ischemia; b. 06/2020 Cath: LM nl, LAD 40m, LCX large, mild diff dzs, RCA mild diff dzs. EF 35-45%; c. 11/2021 ETT: Ex time 3:48. No acute ST/T changes. Max HR 105.   Obesity    OSA (obstructive sleep apnea)    Palpitations 03/31/2020   Pneumonia due to COVID-19 virus 05/11/2020   Premature atrial contractions    a 06/2019 Zio: Predom RSR. Freq PACs (13% burden).   PSVT (paroxysmal supraventricular tachycardia)    Shortness of breath 03/31/2020    Surgical History: Past Surgical History:  Procedure Laterality Date   CARDIAC CATHETERIZATION     COLONOSCOPY N/A 06/14/2021   Procedure: COLONOSCOPY;  Surgeon: Onita Elspeth Sharper, DO;  [facility];  Service: Gastroenterology;  Laterality: N/A;  ESOPHAGOGASTRODUODENOSCOPY N/A 06/14/2021   Procedure: ESOPHAGOGASTRODUODENOSCOPY (EGD);  Surgeon: Onita Elspeth Sharper, DO;  Location: [facility] ENDOSCOPY;  Service: Gastroenterology;  Laterality: N/A;   KIDNEY STONE SURGERY     LEFT HEART CATH AND CORONARY ANGIOGRAPHY Left 06/27/2020   Procedure: LEFT HEART CATH AND CORONARY ANGIOGRAPHY;  Surgeon: Mady Bruckner, MD;  Location: [facility] INVASIVE CV LAB;  Service: Cardiovascular;  Laterality: Left;   VASECTOMY      Home Medications:  Allergies as of 03/25/2024   No Known Allergies      Medication List        Accurate as of  March 25, 2024 11:59 PM. If you have any questions, ask your nurse or doctor.          acetaminophen  325 MG tablet Commonly known as: TYLENOL  Take 650-975 mg by mouth every 6 (six) hours as needed for moderate pain or headache (for pain.).   albuterol  108 (90 Base) MCG/ACT inhaler Commonly known as: VENTOLIN  HFA Inhale 2 puffs into the lungs every 6 (six) hours as needed for wheezing or shortness of breath.   amitriptyline 25 MG tablet Commonly known as: ELAVIL Take 25 mg by mouth at bedtime.   amphetamine -dextroamphetamine  10 MG tablet Commonly known as: Adderall Take 1 tablet (10 mg total) by mouth 2 (two) times daily with a meal.   Azelastine HCl 137 MCG/SPRAY Soln USE 1 SPRAY INTO EACH NOSTRIL TWICE A DAY   buPROPion  150 MG 24 hr tablet Commonly known as: WELLBUTRIN  XL Take 1 tablet (150 mg total) by mouth daily.   celecoxib  100 MG capsule Commonly known as: CELEBREX  TAKE 1 CAPSULE BY MOUTH TWICE A DAY   cetirizine  10 MG tablet Commonly known as: ZYRTEC  Take 1 tablet (10 mg total) by mouth daily as needed (allergies.).   diphenoxylate -atropine  2.5-0.025 MG tablet Commonly known as: LOMOTIL  TAKE 1 TABLET BY MOUTH 4 (FOUR) TIMES DAILY AS NEEDED FOR DIARRHEA OR LOOSE STOOLS   flecainide  50 MG tablet Commonly known as: TAMBOCOR  Take 25 mg by mouth daily.   fluticasone  50 MCG/ACT nasal spray Commonly known as: FLONASE  Place 2 sprays into both nostrils daily as needed (allergies.).   furosemide  40 MG tablet Commonly known as: LASIX  TAKE 1 TABLET BY MOUTH EVERY DAY   hydrOXYzine  10 MG tablet Commonly known as: ATARAX  Take 1 tablet (10 mg total) by mouth 3 (three) times daily as needed.   ipratropium 17 MCG/ACT inhaler Commonly known as: ATROVENT  HFA Inhale 2 puffs into the lungs every 6 (six) hours as needed.   losartan  50 MG tablet Commonly known as: COZAAR  TAKE 1 TABLET BY MOUTH EVERY DAY   metoprolol  succinate 50 MG 24 hr tablet Commonly known as:  TOPROL -XL TAKE 1 TABLET BY MOUTH EVERY DAY   metoprolol  tartrate 25 MG tablet Commonly known as: LOPRESSOR  TAKE 1 TABLET (25 MG) BY MOUTH ONCE DAILY AS NEEDED FOR TACHYCARDIA/PLEASE CALL OFFICE TO SCHEDULE APPOINTMENT PRIOR TO NEXT REFILL   montelukast  10 MG tablet Commonly known as: SINGULAIR  Take 1 tablet (10 mg total) by mouth daily.   omeprazole  40 MG capsule Commonly known as: PRILOSEC Take 1 capsule (40 mg total) by mouth daily.   ondansetron  4 MG disintegrating tablet Commonly known as: ZOFRAN -ODT Take 1 tablet (4 mg total) by mouth every 8 (eight) hours as needed for nausea or vomiting.   ondansetron  4 MG tablet Commonly known as: ZOFRAN  Take 4 mg by mouth every 8 (eight) hours as needed for nausea/vomiting.   oxyCODONE -acetaminophen  5-325 MG tablet  Commonly known as: PERCOCET/ROXICET Take 1 tablet by mouth every 4 (four) hours as needed for severe pain.   pregabalin  100 MG capsule Commonly known as: LYRICA  Take 100 mg by mouth 2 (two) times daily.   spironolactone  50 MG tablet Commonly known as: ALDACTONE  TAKE 1/2 TABLET BY MOUTH DAILY   tadalafil  10 MG tablet Commonly known as: CIALIS  Take 1 tablet (10 mg total) by mouth daily as needed for erectile dysfunction.   tamsulosin  0.4 MG Caps capsule Commonly known as: FLOMAX  Take 1 capsule (0.4 mg total) by mouth daily.   Vitamin D  (Ergocalciferol ) 1.25 MG (50000 UNIT) Caps capsule Commonly known as: DRISDOL TAKE 1 CAPSULE BY MOUTH ONCE WEEKLY        Allergies:  No Known Allergies  Family History: Family History  Problem Relation Age of Onset   COPD Mother    Cancer Mother    Hypertension Father    Congestive Heart Failure Father    COPD Father    Lung cancer Father    Heart attack Father 77   Hypertension Sister    Diabetes Paternal Grandfather    Heart attack Paternal Grandfather    Healthy Son    Healthy Daughter     Social History:  reports that he has never smoked. He has never used  smokeless tobacco. He reports that he does not currently use alcohol. He reports that he does not use drugs.   Physical Exam: BP 129/84   Pulse 73   Ht 5' 9 (1.753 m)   Wt 300 lb (136.1 kg)   BMI 44.30 kg/m   Constitutional:  Well nourished. Alert and oriented, No acute distress. HEENT: [place] AT, moist mucus membranes.  Trachea midline Cardiovascular: No clubbing, cyanosis, or edema. Respiratory: Normal respiratory effort, no increased work of breathing. Neurologic: Grossly intact, no focal deficits, moving all 4 extremities. Psychiatric: Normal mood and affect.   Laboratory Data: See Epic and HPI I have reviewed the labs.    Pertinent Imaging KUB, radiologist interpretation is still pending. I have independently reviewed the films.  See HPI.   Assessment & Plan:    Nephrolithiasis  - KUB no stones seen - possibility of passage of stone - continue to monitor  - RTC in one year for KUB  2. Post-void dribbling - explained that this is typically caused by weakening of the pelvic muscles  - recommend Kegel exercises    Return in about 1 year (around 03/25/2025) for KUB, I PSS .  [facility] [address] [place], [place] [address] [phone]

## 2024-03-25 ENCOUNTER — Ambulatory Visit: Admitting: Urology

## 2024-03-25 ENCOUNTER — Encounter: Payer: Self-pay | Admitting: Urology

## 2024-03-25 VITALS — BP 129/84 | HR 73 | Ht 69.0 in | Wt 300.0 lb

## 2024-03-25 DIAGNOSIS — N3943 Post-void dribbling: Secondary | ICD-10-CM

## 2024-03-25 DIAGNOSIS — N200 Calculus of kidney: Secondary | ICD-10-CM | POA: Diagnosis not present

## 2024-03-25 NOTE — Patient Instructions (Signed)
 Scheduling number: [phone]

## 2024-04-02 ENCOUNTER — Other Ambulatory Visit: Payer: Self-pay | Admitting: Cardiology

## 2024-04-15 ENCOUNTER — Encounter: Payer: Self-pay | Admitting: *Deleted

## 2024-04-15 NOTE — Progress Notes (Unsigned)
 SHAYE LAGACE                                          MRN: [id number]   04/15/2024   The VBCI Quality Team Specialist reviewed this patient medical record for the purposes of chart review for care gap closure. The following were reviewed: {CHL AMB VBCI QUALITY SPECIALIST REASON FOR REVIEW:[id number]}.    VBCI Quality Team

## 2024-04-19 ENCOUNTER — Ambulatory Visit (INDEPENDENT_AMBULATORY_CARE_PROVIDER_SITE_OTHER): Admitting: Family

## 2024-04-19 DIAGNOSIS — E538 Deficiency of other specified B group vitamins: Secondary | ICD-10-CM | POA: Diagnosis not present

## 2024-04-19 MED ORDER — CYANOCOBALAMIN 1000 MCG/ML IJ SOLN
1000.0000 ug | Freq: Once | INTRAMUSCULAR | Status: AC
  Administered 2024-04-19: 1000 ug via INTRAMUSCULAR

## 2024-04-19 NOTE — Progress Notes (Signed)
   CHIEF COMPLAINT  B12 Shot     REASON FOR VISIT  B12 Injection     ASSESSMENT  B12 Deficiency, Unspecified     PLAN  Diagnoses and all orders for this visit:  Vitamin B12 deficiency -     cyanocobalamin  (VITAMIN B12) injection 1,000 mcg     Pt. given B12 injection in clinic.  Return for next injection per provider instructions.   Total time spent: 5 minutes  ALAN CHRISTELLA ARRANT, FNP  04/19/2024

## 2024-04-21 ENCOUNTER — Encounter: Payer: Self-pay | Admitting: *Deleted

## 2024-04-21 NOTE — Progress Notes (Signed)
 Aaron [name]                                          MRN: [id number]   04/21/2024   The VBCI Quality Team Specialist reviewed this patient medical record for the purposes of chart review for care gap closure. The following were reviewed: abstraction for care gap closure-kidney health evaluation for diabetes:eGFR  and uACR.    VBCI Quality Team

## 2024-05-01 ENCOUNTER — Other Ambulatory Visit: Payer: Self-pay | Admitting: Cardiology

## 2024-05-03 NOTE — Progress Notes (Unsigned)
  Electrophysiology Office Follow up Visit Note:    Date:  05/04/2024   ID:  Aaron [name], DOB 07/25/1965, MRN [id number]  PCP:  Orlean Alan HERO, FNP  [facility] Cardiologist:  Lonni Hanson, MD  [facility] Electrophysiologist:  OLE ONEIDA HOLTS, MD    Interval History:     Aaron [name] is a 58 y.o. male who presents for a follow up visit.   I last saw the patient in February 2025.  He has a history of SVT managed with flecainide  and metoprolol .        Past medical, surgical, social and family history were reviewed.  ROS:   Please see the history of present illness.    All other systems reviewed and are negative.  EKGs/Labs/Other Studies Reviewed:    The following studies were reviewed today:     EKG Interpretation Date/Time:  Tuesday May 04 2024 13:22:27 EST Ventricular Rate:  70 PR Interval:  298 QRS Duration:  90 QT Interval:  408 QTC Calculation: 440 R Axis:   -19  Text Interpretation: Sinus rhythm with 1st degree A-V block Confirmed by Holts Ole [phone]) on 05/04/2024 1:30:35 PM    Physical Exam:    VS:  BP 112/80   Pulse 71   Ht 5' 9 (1.753 m)   Wt (!) 320 lb 6.4 oz (145.3 kg)   SpO2 94%   BMI 47.31 kg/m     Wt Readings from Last 3 Encounters:  05/04/24 (!) 320 lb 6.4 oz (145.3 kg)  03/25/24 300 lb (136.1 kg)  02/18/24 (!) 318 lb 6.4 oz (144.4 kg)     GEN: no distress CARD: RRR, No MRG RESP: No IWOB. CTAB.      ASSESSMENT:    1. PSVT (paroxysmal supraventricular tachycardia)   2. Encounter for long-term (current) use of high-risk medication   3. Primary hypertension    PLAN:    In order of problems listed above:  #PSVT #High risk med-flecainide  Doing well on flecainide  and metoprolol .  PR and QRS have remained stable. Continue this regimen   #Obesity Weight loss encouraged   #Hypertension At goal today.  Recommend checking blood pressures 1-2 times per week at home and recording the values.   Recommend bringing these recordings to the primary care physician.  I discussed my upcoming departure from [facility] during today's clinic appointment.  The patient will continue to follow-up with one of my EP partners moving forward.  Follow-up 6 months with EP APP  Signed, Ole Holts, MD, [facility] 05/04/2024 1:38 PM    Electrophysiology  [facility]

## 2024-05-04 ENCOUNTER — Ambulatory Visit: Attending: Cardiology | Admitting: Cardiology

## 2024-05-04 ENCOUNTER — Encounter: Payer: Self-pay | Admitting: Cardiology

## 2024-05-04 VITALS — BP 112/80 | HR 71 | Ht 69.0 in | Wt 320.4 lb

## 2024-05-04 DIAGNOSIS — I10 Essential (primary) hypertension: Secondary | ICD-10-CM | POA: Diagnosis not present

## 2024-05-04 DIAGNOSIS — I471 Supraventricular tachycardia, unspecified: Secondary | ICD-10-CM

## 2024-05-04 DIAGNOSIS — Z79899 Other long term (current) drug therapy: Secondary | ICD-10-CM | POA: Diagnosis not present

## 2024-05-04 MED ORDER — METOPROLOL TARTRATE 25 MG PO TABS: ORAL_TABLET | ORAL | 1 refills | Status: AC

## 2024-05-04 NOTE — Patient Instructions (Signed)
 Medication Instructions:  Your physician recommends that you continue on your current medications as directed. Please refer to the Current Medication list given to you today.   *If you need a refill on your cardiac medications before your next appointment, please call your pharmacy*  Lab Work: None ordered at this time   Follow-Up: At [facility], you and your health needs are our priority.  As part of our continuing mission to provide you with exceptional heart care, our providers are all part of one team.  This team includes your primary Cardiologist (physician) and Advanced Practice Providers or APPs (Physician Assistants and Nurse Practitioners) who all work together to provide you with the care you need, when you need it.  Your next appointment:   6 month(s)  Provider:   You may see Sidra Kitty, MD or one of the following Advanced Practice Providers on your designated Care Team:   Charlies Arthur, [place] Ozell Jodie Passey, PA-C Suzann Riddle, NP Daphne Barrack, NP Artist Pouch, PA-C

## 2024-05-18 ENCOUNTER — Encounter: Payer: Self-pay | Admitting: Family

## 2024-05-18 ENCOUNTER — Ambulatory Visit: Admitting: Family

## 2024-05-18 ENCOUNTER — Other Ambulatory Visit: Payer: Self-pay | Admitting: Family

## 2024-05-18 VITALS — BP 100/68 | HR 57 | Ht 69.0 in | Wt 323.2 lb

## 2024-05-18 DIAGNOSIS — J069 Acute upper respiratory infection, unspecified: Secondary | ICD-10-CM

## 2024-05-18 DIAGNOSIS — E538 Deficiency of other specified B group vitamins: Secondary | ICD-10-CM

## 2024-05-18 LAB — POC INFLUENZA A&B (BINAX/QUICKVUE)
Influenza A, POC: NEGATIVE
Influenza B, POC: NEGATIVE

## 2024-05-18 MED ORDER — CYANOCOBALAMIN 1000 MCG/ML IJ SOLN
1000.0000 ug | Freq: Once | INTRAMUSCULAR | Status: AC
  Administered 2024-05-18: 14:00:00 1000 ug via INTRAMUSCULAR

## 2024-05-18 NOTE — Patient Instructions (Signed)
 Ozempic Dosing:   0.25 mg dose x 2 weeks THEN 0.5 mg dose x 3 weeks.  THEN  1/2 dose on the big pens you already have x 4 weeks, then can increase to full dose if no side effects.

## 2024-05-19 ENCOUNTER — Encounter: Payer: Self-pay | Admitting: Family

## 2024-05-19 ENCOUNTER — Ambulatory Visit

## 2024-05-19 ENCOUNTER — Ambulatory Visit: Admitting: Family

## 2024-05-19 MED ORDER — PREDNISONE 20 MG PO TABS: 40.0000 mg | ORAL_TABLET | Freq: Every day | ORAL | 0 refills | Status: DC

## 2024-05-19 MED ORDER — AMPHETAMINE-DEXTROAMPHETAMINE 10 MG PO TABS: 10.0000 mg | ORAL_TABLET | Freq: Two times a day (BID) | ORAL | 0 refills | Status: DC

## 2024-05-19 NOTE — Progress Notes (Signed)
 Established Patient Office Visit  Subjective:  Patient ID: Aaron [name], male    DOB: 11-23-1965  Age: 58 y.o. MRN: [id number]  Chief Complaint  Patient presents with   Follow-up    3 month follow up    Patient is here today for his 3 months follow up.  He has been feeling fairly well since last appointment.   He does have additional concerns to discuss today.  He has been dealing with a diverticulitis flare and has in the last few days started having sinus congestion and pain.  Asks if we can try something to help with the congestion.   Labs are not due today.  He needs refills.   I have reviewed his active problem list, medication list, allergies, notes from last encounter, lab results for his appointment today.      No other concerns at this time.   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 06/2020 LV gram: EF 35-45%; b. 11/2021 Echo: EF 55%, no rwma, GrI DD, nl RV fxn, mild MR. ao root 42mm.   Cough 07/07/2020   Crohn's disease (HCC)    Elevated troponin 05/18/2020   Heart murmur    Hypertension    Hypokalemia 05/18/2020   Kidney stone    MDD (major depressive disorder)    NICM (nonischemic cardiomyopathy) (HCC)    a. 06/2020 LV gram: EF 35-45%; b. 11/2021 Echo: EF 55%, GrI DD.   Nonobstructive CAD (coronary artery disease)    a. 10/2011 Stress MV: Equivocal. EF 68%, no ischemia; b. 06/2020 Cath: LM nl, LAD 97m, LCX large, mild diff dzs, RCA mild diff dzs. EF 35-45%; c. 11/2021 ETT: Ex time 3:48. No acute ST/T changes. Max HR 105.   Obesity    OSA (obstructive sleep apnea)    Palpitations 03/31/2020   Pneumonia due to COVID-19 virus 05/11/2020   Premature atrial contractions    a 06/2019 Zio: Predom RSR. Freq PACs  (13% burden).   PSVT (paroxysmal supraventricular tachycardia)    Shortness of breath 03/31/2020    Past Surgical History:  Procedure Laterality Date   CARDIAC CATHETERIZATION     COLONOSCOPY N/A 06/14/2021   Procedure: COLONOSCOPY;  Surgeon: Onita Elspeth Sharper, DO;  Location: [facility] ENDOSCOPY;  Service: Gastroenterology;  Laterality: N/A;   ESOPHAGOGASTRODUODENOSCOPY N/A 06/14/2021   Procedure: ESOPHAGOGASTRODUODENOSCOPY (EGD);  Surgeon: Onita Elspeth Sharper, DO;  Location: [facility] ENDOSCOPY;  Service: Gastroenterology;  Laterality: N/A;   KIDNEY STONE SURGERY     LEFT HEART CATH AND CORONARY ANGIOGRAPHY Left 06/27/2020   Procedure: LEFT HEART CATH AND CORONARY ANGIOGRAPHY;  Surgeon: Mady Bruckner, MD;  Location: [facility] INVASIVE CV LAB;  Service: Cardiovascular;  Laterality: Left;   VASECTOMY      Social History   Socioeconomic History   Marital status: Widowed    Spouse name: carisa   Number of children: 2   Years of education: Not on file   Highest education level: High school graduate  Occupational History   Not on file  Tobacco Use   Smoking status: Never   Smokeless tobacco: Never  Vaping Use   Vaping status: Never Used  Substance and Sexual Activity   Alcohol use: Not Currently  Drug use: Never   Sexual activity: Not Currently  Other Topics Concern   Not on file  Social History Narrative   Not on file   Social Drivers of Health   Tobacco Use: Low Risk (05/18/2024)   Patient History    Smoking Tobacco Use: Never    Smokeless Tobacco Use: Never    Passive Exposure: Not on file  Financial Resource Strain: Not on file  Food Insecurity: Not on file  Transportation Needs: Not on file  Physical Activity: Not on file  Stress: Not on file  Social Connections: Not on file  Intimate Partner Violence: Not on file  Depression (PHQ2-9): Low Risk (04/04/2023)   Depression (PHQ2-9)    PHQ-2 Score: 0  Alcohol Screen: Not on file  Housing: Unknown (07/03/2023)   Received  from [facility]   Epic    Unable to Pay for Housing in the Last Year: Not on file    Number of Times Moved in the Last Year: Not on file    At any time in the past 12 months, were you homeless or living in a shelter (including now)?: No  Utilities: Not on file  Health Literacy: Not on file    Family History  Problem Relation Age of Onset   COPD Mother    Cancer Mother    Hypertension Father    Congestive Heart Failure Father    COPD Father    Lung cancer Father    Heart attack Father 9   Hypertension Sister    Diabetes Paternal Grandfather    Heart attack Paternal Grandfather    Healthy Son    Healthy Daughter     Allergies[1]  Review of Systems  HENT:  Positive for congestion, ear pain and sinus pain.   Gastrointestinal:  Positive for abdominal pain and diarrhea.  All other systems reviewed and are negative.      Objective:   BP 100/68   Pulse (!) 57   Ht 5' 9 (1.753 m)   Wt (!) 323 lb 3.2 oz (146.6 kg)   SpO2 95%   BMI 47.73 kg/m   Vitals:   05/18/24 1351  BP: 100/68  Pulse: (!) 57  Height: 5' 9 (1.753 m)  Weight: (!) 323 lb 3.2 oz (146.6 kg)  SpO2: 95%  BMI (Calculated): 47.71    Physical Exam Vitals and nursing note reviewed.  Constitutional:      Appearance: Normal appearance. He is normal weight.  Eyes:     Pupils: Pupils are equal, round, and reactive to light.  Cardiovascular:     Rate and Rhythm: Normal rate and regular rhythm.     Pulses: Normal pulses.     Heart sounds: Normal heart sounds.  Pulmonary:     Effort: Pulmonary effort is normal.     Breath sounds: Normal breath sounds.  Neurological:     Mental Status: He is alert.  Psychiatric:        Mood and Affect: Mood normal.        Behavior: Behavior normal.      Results for orders placed or performed in visit on 05/18/24  POC Influenza A&B (Binax test)  Result Value Ref Range   Influenza A, POC Negative Negative   Influenza B, POC Negative Negative     Recent Results (from the past 2160 hours)  POC Influenza A&B (Binax test)     Status: None   Collection Time: 05/18/24  2:48 PM  Result Value Ref  Range   Influenza A, POC Negative Negative   Influenza B, POC Negative Negative       Assessment & Plan Vitamin B12 deficiency B12 injection given in office today.  Return for next injection per provider instructions.  Acute upper respiratory infection Rapid flu test in office today negative.  Will send in prednisone  rx for pt to help with inflammation and congestion.   Typical atrial flutter [facility]) Patient is seen by cardiology, who manage this condition.  He is well controlled with current therapy.   Will defer to them for further changes to plan of care.  Diverticulitis large intestine w/o perforation or abscess w/o bleeding Patient is seen by gastroenterology, who manage this condition.  He is well controlled with current therapy.   Will defer to them for further changes to plan of care.  Attention deficit hyperactivity disorder (ADHD), predominantly inattentive type Patient stable.  Well controlled with current therapy.   Continue current meds.   Morbid obesity with BMI of 45.0-49.9, adult (HCC) Continue current meds.  Will adjust as needed based on results.  The patient is asked to make an attempt to improve diet and exercise patterns to aid in medical management of this problem. Addressed importance of increasing and maintaining water intake.      No follow-ups on file.   Total time spent: 30 minutes  ALAN CHRISTELLA ARRANT, FNP  05/18/2024   This document may have been prepared by [facility] Voice Recognition software and as such may include unintentional dictation errors.     [1] No Known Allergies

## 2024-05-19 NOTE — Assessment & Plan Note (Signed)
 Patient stable.  Well controlled with current therapy.   Continue current meds.

## 2024-05-19 NOTE — Assessment & Plan Note (Signed)
 Patient is seen by cardiology, who manage this condition.  He is well controlled with current therapy.   Will defer to them for further changes to plan of care.

## 2024-05-19 NOTE — Assessment & Plan Note (Signed)
 B12 injection given in office today.  Return for next injection per provider instructions.

## 2024-05-19 NOTE — Assessment & Plan Note (Signed)
 Continue current meds.  Will adjust as needed based on results.  The patient is asked to make an attempt to improve diet and exercise patterns to aid in medical management of this problem. Addressed importance of increasing and maintaining water  intake.

## 2024-05-29 ENCOUNTER — Other Ambulatory Visit: Payer: Self-pay | Admitting: Family

## 2024-05-31 ENCOUNTER — Telehealth: Payer: Self-pay | Admitting: Family

## 2024-05-31 NOTE — Telephone Encounter (Signed)
 Patient left VM requesting Alan send him in something for a yeast infection. [place] that he is really swollen and irritated and he had tried the OTC yeast meds with no success. States he was recently on antibiotics.

## 2024-06-04 ENCOUNTER — Other Ambulatory Visit: Payer: Self-pay

## 2024-06-04 ENCOUNTER — Other Ambulatory Visit: Payer: Self-pay | Admitting: Family

## 2024-06-04 MED ORDER — NYSTATIN 100000 UNIT/GM EX CREA: 1.0000 | TOPICAL_CREAM | Freq: Two times a day (BID) | CUTANEOUS | 0 refills | Status: AC

## 2024-06-04 MED ORDER — FLUCONAZOLE 150 MG PO TABS: 150.0000 mg | ORAL_TABLET | Freq: Every day | ORAL | 0 refills | Status: AC

## 2024-06-04 NOTE — Telephone Encounter (Signed)
 Patient informed and rx sent.

## 2024-06-11 ENCOUNTER — Telehealth: Payer: Self-pay

## 2024-06-11 NOTE — Telephone Encounter (Signed)
 10:24a pt LM asking for  something to be called in for him for an Ear Infection, ST and green mucus in his nose please advise

## 2024-06-14 ENCOUNTER — Other Ambulatory Visit: Payer: Self-pay

## 2024-06-14 MED ORDER — AMOXICILLIN-POT CLAVULANATE 875-125 MG PO TABS: 1.0000 | ORAL_TABLET | Freq: Two times a day (BID) | ORAL | 0 refills | Status: DC

## 2024-06-14 MED ORDER — BENZONATATE 100 MG PO CAPS: 100.0000 mg | ORAL_CAPSULE | Freq: Three times a day (TID) | ORAL | 0 refills | Status: DC | PRN

## 2024-06-14 NOTE — Telephone Encounter (Signed)
 Patient wife LM last week and again this morning asking for something to be called in for the both of them, stating that they both have the same symptoms and they know its not the flu

## 2024-06-14 NOTE — Telephone Encounter (Signed)
 Per [facility] verbal sent in tessalon  perles and augmentin  for both Aaron [name] and his wife sue

## 2024-06-21 ENCOUNTER — Ambulatory Visit: Admitting: Family

## 2024-06-21 ENCOUNTER — Encounter: Payer: Self-pay | Admitting: Family

## 2024-06-21 VITALS — BP 112/82 | HR 51 | Ht 69.0 in | Wt 314.0 lb

## 2024-06-21 DIAGNOSIS — N471 Phimosis: Secondary | ICD-10-CM | POA: Diagnosis not present

## 2024-06-21 DIAGNOSIS — Z0130 Encounter for examination of blood pressure without abnormal findings: Secondary | ICD-10-CM

## 2024-06-21 DIAGNOSIS — Z6841 Body Mass Index (BMI) 40.0 and over, adult: Secondary | ICD-10-CM | POA: Diagnosis not present

## 2024-06-21 DIAGNOSIS — E538 Deficiency of other specified B group vitamins: Secondary | ICD-10-CM | POA: Diagnosis not present

## 2024-06-21 DIAGNOSIS — E1169 Type 2 diabetes mellitus with other specified complication: Secondary | ICD-10-CM | POA: Diagnosis not present

## 2024-06-21 DIAGNOSIS — J449 Chronic obstructive pulmonary disease, unspecified: Secondary | ICD-10-CM | POA: Diagnosis not present

## 2024-06-21 DIAGNOSIS — I5020 Unspecified systolic (congestive) heart failure: Secondary | ICD-10-CM

## 2024-06-21 DIAGNOSIS — E785 Hyperlipidemia, unspecified: Secondary | ICD-10-CM | POA: Diagnosis not present

## 2024-06-21 DIAGNOSIS — I483 Typical atrial flutter: Secondary | ICD-10-CM

## 2024-06-21 MED ORDER — OZEMPIC (1 MG/DOSE) 4 MG/3ML ~~LOC~~ SOPN: 1.0000 mg | PEN_INJECTOR | SUBCUTANEOUS | 2 refills | Status: AC

## 2024-06-21 MED ORDER — CYANOCOBALAMIN 1000 MCG/ML IJ SOLN
1000.0000 ug | Freq: Once | INTRAMUSCULAR | Status: AC
  Administered 2024-06-21: 1000 ug via INTRAMUSCULAR

## 2024-06-23 ENCOUNTER — Encounter: Payer: Self-pay | Admitting: Family

## 2024-06-23 MED ORDER — TADALAFIL 10 MG PO TABS: 10.0000 mg | ORAL_TABLET | Freq: Every day | ORAL | 2 refills | Status: AC | PRN

## 2024-06-23 MED ORDER — CLOBETASOL PROPIONATE 0.05 % EX CREA: 1.0000 | TOPICAL_CREAM | Freq: Two times a day (BID) | CUTANEOUS | 0 refills | Status: AC

## 2024-06-23 MED ORDER — AMPHETAMINE-DEXTROAMPHETAMINE 10 MG PO TABS: 10.0000 mg | ORAL_TABLET | ORAL | 0 refills | Status: AC

## 2024-06-23 NOTE — Assessment & Plan Note (Signed)
 Patient is seen by cardiology, who manage this condition.  He is well controlled with current therapy.   Will defer to them for further changes to plan of care.

## 2024-06-23 NOTE — Assessment & Plan Note (Signed)
 Continue current therapy for lipid control. Will modify as needed based on labwork results.

## 2024-06-23 NOTE — Assessment & Plan Note (Signed)
 B12 injection given in office today.  Return for next injection per provider instructions.

## 2024-06-23 NOTE — Progress Notes (Signed)
 "  Established Patient Office Visit  Subjective:  Patient ID: Aaron [name], male    DOB: 03-07-1966  Age: 59 y.o. MRN: [id number]  Chief Complaint  Patient presents with   Follow-up    3 month follow up    Patient is here today for his 3 months follow up.  He has been feeling fairly well since last appointment.   He does have additional concerns to discuss today.  1) Needs a B12 injection 2) Has had issues with a yeast infection which have now caused him to have phimosis which is painful.  I have counseled him that he needs to call the urologist, but I will send him something to try in the meantime.   3) Needs refills on his adhd meds and his cialis .   Labs are not due today.  He needs refills.   I have reviewed his active problem list, medication list, allergies, health maintenance, lab results for his appointment today.      No other concerns at this time.   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 06/2020 LV gram: EF 35-45%; b. 11/2021 Echo: EF 55%, no rwma, GrI DD, nl RV fxn, mild MR. ao root 42mm.   Cough 07/07/2020   Crohn's disease (HCC)    Elevated troponin 05/18/2020   Heart murmur    Hypertension    Hypokalemia 05/18/2020   Kidney stone    MDD (major depressive disorder)    NICM (nonischemic cardiomyopathy) (HCC)    a. 06/2020 LV gram: EF 35-45%; b. 11/2021 Echo: EF 55%, GrI DD.   Nonobstructive CAD (coronary artery disease)    a. 10/2011 Stress MV: Equivocal. EF 68%, no ischemia; b. 06/2020 Cath: LM nl, LAD 7m, LCX large, mild diff dzs, RCA mild diff dzs. EF 35-45%; c. 11/2021 ETT: Ex time 3:48. No acute ST/T changes. Max HR 105.   Obesity    OSA (obstructive sleep apnea)    Palpitations 03/31/2020    Pneumonia due to COVID-19 virus 05/11/2020   Premature atrial contractions    a 06/2019 Zio: Predom RSR. Freq PACs (13% burden).   PSVT (paroxysmal supraventricular tachycardia)    Shortness of breath 03/31/2020    Past Surgical History:  Procedure Laterality Date   CARDIAC CATHETERIZATION     COLONOSCOPY N/A 06/14/2021   Procedure: COLONOSCOPY;  Surgeon: Onita Elspeth Sharper, DO;  Location: [facility] ENDOSCOPY;  Service: Gastroenterology;  Laterality: N/A;   ESOPHAGOGASTRODUODENOSCOPY N/A 06/14/2021   Procedure: ESOPHAGOGASTRODUODENOSCOPY (EGD);  Surgeon: Onita Elspeth Sharper, DO;  Location: [facility] ENDOSCOPY;  Service: Gastroenterology;  Laterality: N/A;   KIDNEY STONE SURGERY     LEFT HEART CATH AND CORONARY ANGIOGRAPHY Left 06/27/2020   Procedure: LEFT HEART CATH AND CORONARY ANGIOGRAPHY;  Surgeon: Mady Bruckner, MD;  Location: [facility] INVASIVE CV LAB;  Service: Cardiovascular;  Laterality: Left;   VASECTOMY      Social History   Socioeconomic History   Marital status: Widowed    Spouse name: carisa   Number of children: 2   Years of education: Not on file   Highest education level: High school graduate  Occupational History   Not on file  Tobacco Use   Smoking status: Never  Smokeless tobacco: Never  Vaping Use   Vaping status: Never Used  Substance and Sexual Activity   Alcohol use: Not Currently   Drug use: Never   Sexual activity: Not Currently  Other Topics Concern   Not on file  Social History Narrative   Not on file   Social Drivers of Health   Tobacco Use: Low Risk (06/21/2024)   Patient History    Smoking Tobacco Use: Never    Smokeless Tobacco Use: Never    Passive Exposure: Not on file  Financial Resource Strain: Not on file  Food Insecurity: Not on file  Transportation Needs: Not on file  Physical Activity: Not on file  Stress: Not on file  Social Connections: Not on file  Intimate Partner Violence: Not on file  Depression (PHQ2-9): Low Risk (04/04/2023)    Depression (PHQ2-9)    PHQ-2 Score: 0  Alcohol Screen: Not on file  Housing: Unknown (07/03/2023)   Received from [facility]   Epic    Unable to Pay for Housing in the Last Year: Not on file    Number of Times Moved in the Last Year: Not on file    At any time in the past 12 months, were you homeless or living in a shelter (including now)?: No  Utilities: Not on file  Health Literacy: Not on file    Family History  Problem Relation Age of Onset   COPD Mother    Cancer Mother    Hypertension Father    Congestive Heart Failure Father    COPD Father    Lung cancer Father    Heart attack Father 87   Hypertension Sister    Diabetes Paternal Grandfather    Heart attack Paternal Grandfather    Healthy Son    Healthy Daughter     Allergies[1]  Review of Systems  All other systems reviewed and are negative.      Objective:   BP 112/82   Pulse (!) 51   Ht 5' 9 (1.753 m)   Wt (!) 314 lb (142.4 kg)   SpO2 96%   BMI 46.37 kg/m   Vitals:   06/21/24 1415  BP: 112/82  Pulse: (!) 51  Height: 5' 9 (1.753 m)  Weight: (!) 314 lb (142.4 kg)  SpO2: 96%  BMI (Calculated): 46.35    Physical Exam Vitals and nursing note reviewed.  Constitutional:      Appearance: Normal appearance. He is obese.  Eyes:     Pupils: Pupils are equal, round, and reactive to light.  Cardiovascular:     Rate and Rhythm: Normal rate and regular rhythm.     Pulses: Normal pulses.     Heart sounds: Normal heart sounds.  Pulmonary:     Effort: Pulmonary effort is normal.     Breath sounds: Normal breath sounds.  Genitourinary:    Penis: Phimosis present.   Musculoskeletal:        General: Normal range of motion.  Neurological:     General: No focal deficit present.     Mental Status: He is alert and oriented to person, place, and time.  Psychiatric:        Mood and Affect: Mood normal.        Behavior: Behavior normal.      No results found for any visits on  06/21/24.  Recent Results (from the past 2160 hours)  POC Influenza A&B (Binax test)     Status: None   Collection  Time: 05/18/24  2:48 PM  Result Value Ref Range   Influenza A, POC Negative Negative   Influenza B, POC Negative Negative       Assessment & Plan:   Assessment & Plan Phimosis Clobetasol  RX sent for patient.  Suggested that patient call urology to make an appointment even if this does help with symptoms to be evaluated.  Vitamin B12 deficiency B12 injection given in office today.  Return for next injection per provider instructions.  Heart failure with improved ejection fraction (HFimpEF) (HCC) Typical atrial flutter (HCC) Patient is seen by cardiology, who manage this condition.  He is well controlled with current therapy.   Will defer to them for further changes to plan of care.  Chronic obstructive pulmonary disease, unspecified COPD type (HCC) Patient is seen by , who manage this condpulmonaryition.  He is well controlled with current therapy.   Will defer to them for further changes to plan of care.  Body mass index (BMI) 45.0-49.9, adult (HCC) Continue current meds.  Will adjust as needed based on results.  The patient is asked to make an attempt to improve diet and exercise patterns to aid in medical management of this problem. Addressed importance of increasing and maintaining water intake.   Hyperlipidemia associated with type 2 diabetes mellitus (HCC) Continue current therapy for lipid control. Will modify as needed based on labwork results.      Return in about 3 months (around 09/19/2024) for F/U.   Total time spent: 30 minutes  ALAN CHRISTELLA ARRANT, FNP  06/21/2024   This document may have been prepared by [facility] Voice Recognition software and as such may include unintentional dictation errors.       [1] No Known Allergies  "

## 2024-06-23 NOTE — Assessment & Plan Note (Signed)
 Patient is seen by , who manage this condpulmonaryition.  He is well controlled with current therapy.   Will defer to them for further changes to plan of care.

## 2024-06-26 ENCOUNTER — Other Ambulatory Visit: Payer: Self-pay | Admitting: Family

## 2024-07-22 ENCOUNTER — Ambulatory Visit

## 2024-09-21 ENCOUNTER — Ambulatory Visit: Admitting: Family

## 2025-03-25 ENCOUNTER — Ambulatory Visit: Admitting: Urology
